# Patient Record
Sex: Female | Born: 1945 | Race: White | Hispanic: No | Smoking: Never smoker
Health system: Southern US, Community
[De-identification: ages and names within clinical notes are randomized; demographics above are authoritative.]

## PROBLEM LIST (undated history)

## (undated) DIAGNOSIS — K8020 Calculus of gallbladder without cholecystitis without obstruction: Secondary | ICD-10-CM

## (undated) DIAGNOSIS — F419 Anxiety disorder, unspecified: Secondary | ICD-10-CM

## (undated) DIAGNOSIS — I499 Cardiac arrhythmia, unspecified: Secondary | ICD-10-CM

## (undated) DIAGNOSIS — D649 Anemia, unspecified: Secondary | ICD-10-CM

## (undated) DIAGNOSIS — Z809 Family history of malignant neoplasm, unspecified: Secondary | ICD-10-CM

## (undated) DIAGNOSIS — R55 Syncope and collapse: Secondary | ICD-10-CM

## (undated) DIAGNOSIS — E119 Type 2 diabetes mellitus without complications: Secondary | ICD-10-CM

## (undated) DIAGNOSIS — C801 Malignant (primary) neoplasm, unspecified: Secondary | ICD-10-CM

## (undated) DIAGNOSIS — G4730 Sleep apnea, unspecified: Secondary | ICD-10-CM

## (undated) DIAGNOSIS — M1990 Unspecified osteoarthritis, unspecified site: Secondary | ICD-10-CM

## (undated) DIAGNOSIS — I10 Essential (primary) hypertension: Secondary | ICD-10-CM

## (undated) DIAGNOSIS — Z803 Family history of malignant neoplasm of breast: Secondary | ICD-10-CM

## (undated) DIAGNOSIS — Z923 Personal history of irradiation: Secondary | ICD-10-CM

## (undated) DIAGNOSIS — Z973 Presence of spectacles and contact lenses: Secondary | ICD-10-CM

## (undated) DIAGNOSIS — E669 Obesity, unspecified: Secondary | ICD-10-CM

## (undated) DIAGNOSIS — Z87442 Personal history of urinary calculi: Secondary | ICD-10-CM

## (undated) DIAGNOSIS — Z950 Presence of cardiac pacemaker: Secondary | ICD-10-CM

## (undated) DIAGNOSIS — E039 Hypothyroidism, unspecified: Secondary | ICD-10-CM

## (undated) DIAGNOSIS — K219 Gastro-esophageal reflux disease without esophagitis: Secondary | ICD-10-CM

## (undated) HISTORY — PX: COLONOSCOPY: SHX174

## (undated) HISTORY — PX: CHOLECYSTECTOMY: SHX55

## (undated) HISTORY — PX: FOOT SURGERY: SHX648

## (undated) HISTORY — PX: BREAST SURGERY: SHX581

## (undated) HISTORY — PX: FOOT ARTHROTOMY: SHX952

## (undated) HISTORY — DX: Syncope and collapse: R55

## (undated) HISTORY — PX: EYE SURGERY: SHX253

## (undated) HISTORY — DX: Personal history of irradiation: Z92.3

## (undated) HISTORY — PX: ANKLE SURGERY: SHX546

## (undated) HISTORY — DX: Family history of malignant neoplasm of breast: Z80.3

## (undated) HISTORY — PX: DEEP THIGH / KNEE TUMOR EXCISION: SUR571

## (undated) HISTORY — PX: KNEE SURGERY: SHX244

## (undated) HISTORY — DX: Family history of malignant neoplasm, unspecified: Z80.9

---

## 2003-08-09 ENCOUNTER — Ambulatory Visit (HOSPITAL_COMMUNITY): Admission: RE | Admit: 2003-08-09 | Discharge: 2003-08-09 | Payer: Self-pay | Admitting: Internal Medicine

## 2005-04-18 ENCOUNTER — Ambulatory Visit (HOSPITAL_COMMUNITY): Admission: RE | Admit: 2005-04-18 | Discharge: 2005-04-18 | Payer: Self-pay | Admitting: Internal Medicine

## 2010-05-07 HISTORY — PX: SHOULDER SURGERY: SHX246

## 2011-05-23 DIAGNOSIS — M79609 Pain in unspecified limb: Secondary | ICD-10-CM | POA: Diagnosis not present

## 2011-05-23 DIAGNOSIS — L600 Ingrowing nail: Secondary | ICD-10-CM | POA: Diagnosis not present

## 2011-05-25 DIAGNOSIS — H43399 Other vitreous opacities, unspecified eye: Secondary | ICD-10-CM | POA: Diagnosis not present

## 2011-05-25 DIAGNOSIS — H353190 Nonexudative age-related macular degeneration, unspecified eye, stage unspecified: Secondary | ICD-10-CM | POA: Diagnosis not present

## 2011-05-25 DIAGNOSIS — H53149 Visual discomfort, unspecified: Secondary | ICD-10-CM | POA: Diagnosis not present

## 2011-05-25 DIAGNOSIS — E119 Type 2 diabetes mellitus without complications: Secondary | ICD-10-CM | POA: Diagnosis not present

## 2011-07-04 DIAGNOSIS — J019 Acute sinusitis, unspecified: Secondary | ICD-10-CM | POA: Diagnosis not present

## 2011-07-04 DIAGNOSIS — J069 Acute upper respiratory infection, unspecified: Secondary | ICD-10-CM | POA: Diagnosis not present

## 2011-07-04 DIAGNOSIS — J029 Acute pharyngitis, unspecified: Secondary | ICD-10-CM | POA: Diagnosis not present

## 2011-07-04 DIAGNOSIS — R05 Cough: Secondary | ICD-10-CM | POA: Diagnosis not present

## 2011-07-31 DIAGNOSIS — H6120 Impacted cerumen, unspecified ear: Secondary | ICD-10-CM | POA: Diagnosis not present

## 2011-09-12 DIAGNOSIS — I10 Essential (primary) hypertension: Secondary | ICD-10-CM | POA: Diagnosis not present

## 2011-09-12 DIAGNOSIS — E782 Mixed hyperlipidemia: Secondary | ICD-10-CM | POA: Diagnosis not present

## 2011-09-12 DIAGNOSIS — E039 Hypothyroidism, unspecified: Secondary | ICD-10-CM | POA: Diagnosis not present

## 2011-09-26 DIAGNOSIS — M25569 Pain in unspecified knee: Secondary | ICD-10-CM | POA: Diagnosis not present

## 2011-09-27 DIAGNOSIS — M25569 Pain in unspecified knee: Secondary | ICD-10-CM | POA: Diagnosis not present

## 2011-09-27 DIAGNOSIS — M2240 Chondromalacia patellae, unspecified knee: Secondary | ICD-10-CM | POA: Diagnosis not present

## 2011-09-27 DIAGNOSIS — IMO0002 Reserved for concepts with insufficient information to code with codable children: Secondary | ICD-10-CM | POA: Diagnosis not present

## 2011-10-03 DIAGNOSIS — M1710 Unilateral primary osteoarthritis, unspecified knee: Secondary | ICD-10-CM | POA: Diagnosis not present

## 2011-10-03 DIAGNOSIS — E669 Obesity, unspecified: Secondary | ICD-10-CM | POA: Diagnosis not present

## 2011-10-03 DIAGNOSIS — E039 Hypothyroidism, unspecified: Secondary | ICD-10-CM | POA: Diagnosis not present

## 2011-10-03 DIAGNOSIS — M1990 Unspecified osteoarthritis, unspecified site: Secondary | ICD-10-CM | POA: Diagnosis not present

## 2011-10-03 DIAGNOSIS — E782 Mixed hyperlipidemia: Secondary | ICD-10-CM | POA: Diagnosis not present

## 2011-10-03 DIAGNOSIS — I10 Essential (primary) hypertension: Secondary | ICD-10-CM | POA: Diagnosis not present

## 2011-10-10 DIAGNOSIS — M1710 Unilateral primary osteoarthritis, unspecified knee: Secondary | ICD-10-CM | POA: Diagnosis not present

## 2011-10-17 DIAGNOSIS — M1710 Unilateral primary osteoarthritis, unspecified knee: Secondary | ICD-10-CM | POA: Diagnosis not present

## 2011-11-14 DIAGNOSIS — S90929A Unspecified superficial injury of unspecified foot, initial encounter: Secondary | ICD-10-CM | POA: Diagnosis not present

## 2011-11-14 DIAGNOSIS — M1710 Unilateral primary osteoarthritis, unspecified knee: Secondary | ICD-10-CM | POA: Diagnosis not present

## 2011-11-21 DIAGNOSIS — M1710 Unilateral primary osteoarthritis, unspecified knee: Secondary | ICD-10-CM | POA: Diagnosis not present

## 2011-11-28 DIAGNOSIS — M1710 Unilateral primary osteoarthritis, unspecified knee: Secondary | ICD-10-CM | POA: Diagnosis not present

## 2012-01-16 DIAGNOSIS — I10 Essential (primary) hypertension: Secondary | ICD-10-CM | POA: Diagnosis not present

## 2012-01-16 DIAGNOSIS — E7800 Pure hypercholesterolemia, unspecified: Secondary | ICD-10-CM | POA: Diagnosis not present

## 2012-01-23 DIAGNOSIS — E669 Obesity, unspecified: Secondary | ICD-10-CM | POA: Diagnosis not present

## 2012-01-23 DIAGNOSIS — E782 Mixed hyperlipidemia: Secondary | ICD-10-CM | POA: Diagnosis not present

## 2012-01-23 DIAGNOSIS — M1990 Unspecified osteoarthritis, unspecified site: Secondary | ICD-10-CM | POA: Diagnosis not present

## 2012-01-23 DIAGNOSIS — Z23 Encounter for immunization: Secondary | ICD-10-CM | POA: Diagnosis not present

## 2012-01-23 DIAGNOSIS — E039 Hypothyroidism, unspecified: Secondary | ICD-10-CM | POA: Diagnosis not present

## 2012-01-23 DIAGNOSIS — I10 Essential (primary) hypertension: Secondary | ICD-10-CM | POA: Diagnosis not present

## 2012-06-03 DIAGNOSIS — M79609 Pain in unspecified limb: Secondary | ICD-10-CM | POA: Diagnosis not present

## 2012-06-03 DIAGNOSIS — Q828 Other specified congenital malformations of skin: Secondary | ICD-10-CM | POA: Diagnosis not present

## 2012-06-04 DIAGNOSIS — H353190 Nonexudative age-related macular degeneration, unspecified eye, stage unspecified: Secondary | ICD-10-CM | POA: Diagnosis not present

## 2012-06-04 DIAGNOSIS — H04129 Dry eye syndrome of unspecified lacrimal gland: Secondary | ICD-10-CM | POA: Diagnosis not present

## 2012-06-04 DIAGNOSIS — Z961 Presence of intraocular lens: Secondary | ICD-10-CM | POA: Diagnosis not present

## 2012-06-04 DIAGNOSIS — D492 Neoplasm of unspecified behavior of bone, soft tissue, and skin: Secondary | ICD-10-CM | POA: Diagnosis not present

## 2012-06-04 DIAGNOSIS — H35039 Hypertensive retinopathy, unspecified eye: Secondary | ICD-10-CM | POA: Diagnosis not present

## 2012-06-06 DIAGNOSIS — I10 Essential (primary) hypertension: Secondary | ICD-10-CM | POA: Diagnosis not present

## 2012-06-06 DIAGNOSIS — E782 Mixed hyperlipidemia: Secondary | ICD-10-CM | POA: Diagnosis not present

## 2012-06-25 DIAGNOSIS — L570 Actinic keratosis: Secondary | ICD-10-CM | POA: Diagnosis not present

## 2012-06-25 DIAGNOSIS — L821 Other seborrheic keratosis: Secondary | ICD-10-CM | POA: Diagnosis not present

## 2012-06-25 DIAGNOSIS — D485 Neoplasm of uncertain behavior of skin: Secondary | ICD-10-CM | POA: Diagnosis not present

## 2012-06-25 DIAGNOSIS — L820 Inflamed seborrheic keratosis: Secondary | ICD-10-CM | POA: Diagnosis not present

## 2012-10-21 DIAGNOSIS — IMO0001 Reserved for inherently not codable concepts without codable children: Secondary | ICD-10-CM | POA: Diagnosis not present

## 2012-10-21 DIAGNOSIS — E782 Mixed hyperlipidemia: Secondary | ICD-10-CM | POA: Diagnosis not present

## 2012-10-21 DIAGNOSIS — I10 Essential (primary) hypertension: Secondary | ICD-10-CM | POA: Diagnosis not present

## 2012-10-24 DIAGNOSIS — M1990 Unspecified osteoarthritis, unspecified site: Secondary | ICD-10-CM | POA: Diagnosis not present

## 2012-10-24 DIAGNOSIS — E669 Obesity, unspecified: Secondary | ICD-10-CM | POA: Diagnosis not present

## 2012-10-24 DIAGNOSIS — E039 Hypothyroidism, unspecified: Secondary | ICD-10-CM | POA: Diagnosis not present

## 2012-10-24 DIAGNOSIS — I10 Essential (primary) hypertension: Secondary | ICD-10-CM | POA: Diagnosis not present

## 2012-10-24 DIAGNOSIS — E782 Mixed hyperlipidemia: Secondary | ICD-10-CM | POA: Diagnosis not present

## 2012-11-05 DIAGNOSIS — Z1231 Encounter for screening mammogram for malignant neoplasm of breast: Secondary | ICD-10-CM | POA: Diagnosis not present

## 2012-11-20 ENCOUNTER — Other Ambulatory Visit: Payer: Self-pay | Admitting: Radiology

## 2012-11-20 DIAGNOSIS — R209 Unspecified disturbances of skin sensation: Secondary | ICD-10-CM

## 2012-11-21 ENCOUNTER — Ambulatory Visit (INDEPENDENT_AMBULATORY_CARE_PROVIDER_SITE_OTHER): Payer: Medicare Other | Admitting: Radiology

## 2012-11-21 DIAGNOSIS — R209 Unspecified disturbances of skin sensation: Secondary | ICD-10-CM

## 2012-12-01 ENCOUNTER — Telehealth: Payer: Self-pay | Admitting: *Deleted

## 2012-12-01 NOTE — Telephone Encounter (Signed)
Patient has right carpal tunnel syndrome. Please send report to referring MD (Dr. Donzetta Sprung). Patient should follow up with him for further advice, mgmt.  -VRP

## 2012-12-01 NOTE — Procedures (Signed)
   [facility]  NCS (NERVE CONDUCTION STUDY) WITH EMG (ELECTROMYOGRAPHY) REPORT   STUDY DATE: 11/21/12 PATIENT NAME: Alicia [name] DOB: 1945-07-20 MRN: [id number]  ORDERING CLINICIAN: Donzetta Sprung  TECHNOLOGIST: Kaylyn Lim ELECTROMYOGRAPHER: not ordered  CLINICAL INFORMATION: 67 year old female with right arm numbness and pain.  FINDINGS: NERVE CONDUCTION STUDY: Right median motor responses prolonged distal latency (5.1 ms), normal amplitude, normal conduction velocity and normal F-wave latency. Right ulnar motor response has normal distal latency, amplitude, conduction velocity and F-wave latency. Right median sensory response has normal amplitude and slow conduction velocity (42 m/s). Right ulnar, right radial sensory responses are normal. Right median transcarpal mixed nerve response has normal amplitude and slow conduction velocity (28 m/s). Right ulnar transcarpal mixed nerve response is normal.  NEEDLE ELECTROMYOGRAPHY: Not ordered.  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   INTERPRETING PHYSICIAN:  Suanne Marker, MD Certified in Neurology, Neurophysiology and Neuroimaging  [facility] [address] [place], [place] [address] [phone]

## 2012-12-01 NOTE — Telephone Encounter (Signed)
Patient is calling wanting to know the results of her EEG she had done on 11-21-12. Please advise.

## 2012-12-02 NOTE — Telephone Encounter (Signed)
Spoke with pt and relayed EEG results as well as Dr Richrd Humbles advice.  Pt understood and had no questions.  Results are being faxed to referring Dr.

## 2012-12-05 DIAGNOSIS — Z950 Presence of cardiac pacemaker: Secondary | ICD-10-CM | POA: Insufficient documentation

## 2012-12-12 DIAGNOSIS — Z79899 Other long term (current) drug therapy: Secondary | ICD-10-CM | POA: Diagnosis not present

## 2012-12-12 DIAGNOSIS — Z888 Allergy status to other drugs, medicaments and biological substances status: Secondary | ICD-10-CM | POA: Diagnosis not present

## 2012-12-12 DIAGNOSIS — Z7982 Long term (current) use of aspirin: Secondary | ICD-10-CM | POA: Diagnosis not present

## 2012-12-12 DIAGNOSIS — I10 Essential (primary) hypertension: Secondary | ICD-10-CM | POA: Diagnosis not present

## 2012-12-12 DIAGNOSIS — E669 Obesity, unspecified: Secondary | ICD-10-CM | POA: Diagnosis not present

## 2012-12-12 DIAGNOSIS — M79609 Pain in unspecified limb: Secondary | ICD-10-CM | POA: Diagnosis not present

## 2012-12-12 DIAGNOSIS — E119 Type 2 diabetes mellitus without complications: Secondary | ICD-10-CM | POA: Diagnosis not present

## 2012-12-12 DIAGNOSIS — Z882 Allergy status to sulfonamides status: Secondary | ICD-10-CM | POA: Diagnosis not present

## 2012-12-12 DIAGNOSIS — G5600 Carpal tunnel syndrome, unspecified upper limb: Secondary | ICD-10-CM | POA: Diagnosis not present

## 2012-12-12 DIAGNOSIS — M7989 Other specified soft tissue disorders: Secondary | ICD-10-CM | POA: Diagnosis not present

## 2012-12-12 DIAGNOSIS — Z6836 Body mass index (BMI) 36.0-36.9, adult: Secondary | ICD-10-CM | POA: Diagnosis not present

## 2012-12-12 DIAGNOSIS — E039 Hypothyroidism, unspecified: Secondary | ICD-10-CM | POA: Diagnosis not present

## 2012-12-12 DIAGNOSIS — Z91040 Latex allergy status: Secondary | ICD-10-CM | POA: Diagnosis not present

## 2012-12-12 DIAGNOSIS — I441 Atrioventricular block, second degree: Secondary | ICD-10-CM | POA: Diagnosis not present

## 2012-12-12 DIAGNOSIS — M6530 Trigger finger, unspecified finger: Secondary | ICD-10-CM | POA: Diagnosis not present

## 2012-12-12 DIAGNOSIS — R55 Syncope and collapse: Secondary | ICD-10-CM | POA: Diagnosis not present

## 2012-12-13 ENCOUNTER — Encounter (HOSPITAL_COMMUNITY): Payer: Self-pay | Admitting: Internal Medicine

## 2012-12-13 ENCOUNTER — Inpatient Hospital Stay (HOSPITAL_COMMUNITY)
Admission: AD | Admit: 2012-12-13 | Discharge: 2012-12-16 | DRG: 244 | Disposition: A | Discharge status: Home / Self Care | Payer: Medicare Other | Source: Other Acute Inpatient Hospital | Attending: Internal Medicine | Admitting: Internal Medicine

## 2012-12-13 ENCOUNTER — Inpatient Hospital Stay (HOSPITAL_COMMUNITY): Payer: Medicare Other

## 2012-12-13 DIAGNOSIS — I10 Essential (primary) hypertension: Secondary | ICD-10-CM | POA: Diagnosis not present

## 2012-12-13 DIAGNOSIS — Z9089 Acquired absence of other organs: Secondary | ICD-10-CM

## 2012-12-13 DIAGNOSIS — Z79899 Other long term (current) drug therapy: Secondary | ICD-10-CM | POA: Diagnosis not present

## 2012-12-13 DIAGNOSIS — Z91040 Latex allergy status: Secondary | ICD-10-CM | POA: Diagnosis not present

## 2012-12-13 DIAGNOSIS — Z01818 Encounter for other preprocedural examination: Secondary | ICD-10-CM | POA: Diagnosis not present

## 2012-12-13 DIAGNOSIS — E119 Type 2 diabetes mellitus without complications: Secondary | ICD-10-CM | POA: Diagnosis not present

## 2012-12-13 DIAGNOSIS — Z7982 Long term (current) use of aspirin: Secondary | ICD-10-CM | POA: Diagnosis not present

## 2012-12-13 DIAGNOSIS — IMO0002 Reserved for concepts with insufficient information to code with codable children: Secondary | ICD-10-CM | POA: Diagnosis not present

## 2012-12-13 DIAGNOSIS — Z882 Allergy status to sulfonamides status: Secondary | ICD-10-CM

## 2012-12-13 DIAGNOSIS — I441 Atrioventricular block, second degree: Principal | ICD-10-CM | POA: Diagnosis present

## 2012-12-13 DIAGNOSIS — E039 Hypothyroidism, unspecified: Secondary | ICD-10-CM | POA: Diagnosis present

## 2012-12-13 DIAGNOSIS — R55 Syncope and collapse: Secondary | ICD-10-CM | POA: Diagnosis not present

## 2012-12-13 DIAGNOSIS — M1990 Unspecified osteoarthritis, unspecified site: Secondary | ICD-10-CM | POA: Diagnosis present

## 2012-12-13 DIAGNOSIS — Z6836 Body mass index (BMI) 36.0-36.9, adult: Secondary | ICD-10-CM | POA: Diagnosis not present

## 2012-12-13 DIAGNOSIS — I517 Cardiomegaly: Secondary | ICD-10-CM | POA: Diagnosis not present

## 2012-12-13 DIAGNOSIS — M1710 Unilateral primary osteoarthritis, unspecified knee: Secondary | ICD-10-CM | POA: Diagnosis not present

## 2012-12-13 DIAGNOSIS — E669 Obesity, unspecified: Secondary | ICD-10-CM | POA: Diagnosis present

## 2012-12-13 HISTORY — DX: Hypothyroidism, unspecified: E03.9

## 2012-12-13 HISTORY — DX: Presence of cardiac pacemaker: Z95.0

## 2012-12-13 HISTORY — DX: Essential (primary) hypertension: I10

## 2012-12-13 HISTORY — DX: Cardiac arrhythmia, unspecified: I49.9

## 2012-12-13 HISTORY — DX: Type 2 diabetes mellitus without complications: E11.9

## 2012-12-13 HISTORY — DX: Obesity, unspecified: E66.9

## 2012-12-13 HISTORY — DX: Unspecified osteoarthritis, unspecified site: M19.90

## 2012-12-13 HISTORY — DX: Anemia, unspecified: D64.9

## 2012-12-13 LAB — GLUCOSE, CAPILLARY: Glucose-Capillary: 210 mg/dL — ABNORMAL HIGH (ref 70–99)

## 2012-12-13 MED ORDER — SODIUM CHLORIDE 0.9 % IJ SOLN
3.0000 mL | Freq: Two times a day (BID) | INTRAMUSCULAR | Status: DC
  Administered 2012-12-13 – 2012-12-15 (×4): 3 mL via INTRAVENOUS

## 2012-12-13 MED ORDER — ONDANSETRON HCL 4 MG/2ML IJ SOLN: 4.0000 mg | Freq: Four times a day (QID) | INTRAMUSCULAR | Status: DC | PRN

## 2012-12-13 MED ORDER — GLIPIZIDE ER 10 MG PO TB24
10.0000 mg | ORAL_TABLET | Freq: Every day | ORAL | Status: DC
  Administered 2012-12-14 – 2012-12-16 (×3): 10 mg via ORAL
  Filled 2012-12-13 (×4): qty 1

## 2012-12-13 MED ORDER — HEPARIN SODIUM (PORCINE) 5000 UNIT/ML IJ SOLN
5000.0000 [IU] | Freq: Three times a day (TID) | INTRAMUSCULAR | Status: DC
  Administered 2012-12-13 – 2012-12-15 (×7): 5000 [IU] via SUBCUTANEOUS
  Filled 2012-12-13 (×12): qty 1

## 2012-12-13 MED ORDER — SODIUM CHLORIDE 0.9 % IJ SOLN: 3.0000 mL | INTRAMUSCULAR | Status: DC | PRN

## 2012-12-13 MED ORDER — ACETAMINOPHEN 325 MG PO TABS: 650.0000 mg | ORAL_TABLET | ORAL | Status: DC | PRN

## 2012-12-13 MED ORDER — LEVOTHYROXINE SODIUM 125 MCG PO TABS
125.0000 ug | ORAL_TABLET | Freq: Every day | ORAL | Status: DC
  Administered 2012-12-14 – 2012-12-16 (×3): 125 ug via ORAL
  Filled 2012-12-13 (×4): qty 1

## 2012-12-13 MED ORDER — SODIUM CHLORIDE 0.9 % IV SOLN: 250.0000 mL | INTRAVENOUS | Status: DC | PRN

## 2012-12-13 MED ORDER — LISINOPRIL 20 MG PO TABS
20.0000 mg | ORAL_TABLET | Freq: Every day | ORAL | Status: DC
  Administered 2012-12-13 – 2012-12-15 (×3): 20 mg via ORAL
  Filled 2012-12-13 (×4): qty 1

## 2012-12-13 NOTE — H&P (Signed)
ELECTROPHYSIOLOGY ADMIT NOTE     Primary Care Physician: Donzetta Sprung, MD Referring Physician:  Dr Wende Crease  Admit Date: 12/13/2012  Reason for consultation:  Syncope, AV block  Alicia [name] is a 67 y.o. female with a h/o DM, HTN, and obesity who presents for further evaluation of syncope.  She reports being in her usual state of health yesterday.  She was at an Orthopedic clinic having a shoulder Xray when she had sudden syncope.  She reports that she had been standing for about 5 minutes.  While having her xray, she had abrupt sweats with nausea immediately followed by syncope.  She woke on the floor.  She was observed overnight at 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  She is therefore transferred to [facility] for further evaluation.  She has been on no AV nodal agents recently.  Today, she denies symptoms of palpitations, chest pain, shortness of breath, orthopnea, PND, lower extremity edema, dizziness, or neurologic sequela.  She denies any other episodes of recent syncope. The patient is tolerating medications without difficulties and is otherwise without complaint today.   Past Medical History  Diagnosis Date  . Obesity   . Hypertension   . Diabetes   . DJD (degenerative joint disease)   . Hypothyroidism    Past Surgical History  Procedure Laterality Date  . Cholecystectomy         Allergies  Allergen Reactions  . Sulfa Antibiotics Hives and Swelling  . Tape     Paper tape is okay  . Latex Rash    Sensitive not allergic    History   Social History  . Marital Status: Married    Spouse Name: N/A    Number of Children: N/A  . Years of Education: N/A   Occupational History  . Not on file.   Social History Main Topics  . Smoking status: Never Smoker   . Smokeless tobacco: Not on file  . Alcohol Use: No  . Drug Use: No  . Sexually Active: Not on file   Other Topics Concern  . Not on file   Social History Narrative   Retired and lives in [place] with spouse    Family History  Problem Relation Age of Onset  . Sudden death Brother     age 52s, died while playing raquetball    ROS- All systems are reviewed and negative except as per the HPI above  Physical Exam: Telemetry: from [place] is reviewed and reveals mobitz II second degree AV block Filed Vitals:   12/13/12 1300  BP: 142/55  Pulse: 66  Temp: 98.4 F (36.9 C)  TempSrc: Oral  Resp: 20  Height: 5\' 7"  (1.702 m)  Weight: 229 lb 15 oz (104.3 kg)  SpO2: 96%    GEN- The patient is overweight appearing, alert and oriented x 3 today.   Head- normocephalic, atraumatic Eyes-  Sclera clear, conjunctiva pink Ears- hearing intact Oropharynx- clear Neck- supple, no JVP Lymph- no cervical lymphadenopathy Lungs- Clear to ausculation bilaterally, normal work of breathing Heart- Regular rate and rhythm, no murmurs, rubs or gallops, PMI not laterally displaced GI- soft, NT, ND, + BS Extremities- no clubbing, cyanosis, or edema MS- no significant deformity or atrophy Skin- no rash or lesion Psych- euthymic mood, full affect Neuro- strength and sensation are intact  EKG 12/12/12- sinus rhythm 66 bpm, LAHB, LVH with repolarization abnormality Labs from [place] are reviewed and are unremarkable  Labs:  No results found for this basename: WBC, HGB, HCT, MCV, PLT   No results found for this basename: NA, K, CL, CO2, BUN, CREATININE, CALCIUM, LABALBU, PROT, BILITOT, ALKPHOS, ALT, AST, GLUCOSE,  in the last 168 hours No results found for this basename: CKTOTAL, CKMB, CKMBINDEX, TROPONINI    No results found for this basename: CHOL   No results found for this basename: HDL   No results found for this basename: LDLCALC   No results found for this basename: TRIG   No results found for this basename: CHOLHDL   No results found for this basename: LDLDIRECT      ASSESSMENT AND PLAN:   1. 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  No reversible causes are found.  I would therefore recommend pacemaker implantation at this time.  Risks, benefits, alternatives to pacemaker implantation were discussed in detail with the patient today. The patient understands that the risks include but are not limited to bleeding, infection, pneumothorax, perforation, tamponade, vascular damage, renal failure, MI, stroke, death,  and lead dislodgement and wishes to proceed. We will therefore schedule the procedure at the next available time. Obtain echo to evaluate for any structural heart issues.  No driving for 6 months (pt aware)   2. HTN Follow BP Hold hctz  3. DM Stable No change required today    Hillis Range, MD 12/13/2012  2:36 PM

## 2012-12-14 DIAGNOSIS — I441 Atrioventricular block, second degree: Secondary | ICD-10-CM | POA: Diagnosis not present

## 2012-12-14 DIAGNOSIS — I517 Cardiomegaly: Secondary | ICD-10-CM | POA: Diagnosis not present

## 2012-12-14 DIAGNOSIS — I10 Essential (primary) hypertension: Secondary | ICD-10-CM | POA: Diagnosis not present

## 2012-12-14 DIAGNOSIS — R55 Syncope and collapse: Secondary | ICD-10-CM | POA: Diagnosis not present

## 2012-12-14 DIAGNOSIS — E119 Type 2 diabetes mellitus without complications: Secondary | ICD-10-CM | POA: Diagnosis not present

## 2012-12-14 LAB — BASIC METABOLIC PANEL
BUN: 20 mg/dL (ref 6–23)
Chloride: 98 mEq/L (ref 96–112)
Creatinine, Ser: 1.11 mg/dL — ABNORMAL HIGH (ref 0.50–1.10)
GFR calc Af Amer: 58 mL/min — ABNORMAL LOW (ref >90)
Glucose, Bld: 165 mg/dL — ABNORMAL HIGH (ref 70–99)
Potassium: 3.7 mEq/L (ref 3.5–5.1)

## 2012-12-14 LAB — CBC
HCT: 37.7 % (ref 36.0–46.0)
Hemoglobin: 13.2 g/dL (ref 12.0–15.0)
MCHC: 35 g/dL (ref 30.0–36.0)
MCV: 90.4 fL (ref 78.0–100.0)
RDW: 12.5 % (ref 11.5–15.5)

## 2012-12-14 LAB — GLUCOSE, CAPILLARY
Glucose-Capillary: 155 mg/dL — ABNORMAL HIGH (ref 70–99)
Glucose-Capillary: 149 mg/dL — ABNORMAL HIGH (ref 70–99)
Glucose-Capillary: 106 mg/dL — ABNORMAL HIGH (ref 70–99)
Glucose-Capillary: 94 mg/dL (ref 70–99)

## 2012-12-14 MED ORDER — SODIUM CHLORIDE 0.9 % IV SOLN: INTRAVENOUS | Status: DC

## 2012-12-14 MED ORDER — CHLORHEXIDINE GLUCONATE 4 % EX LIQD
60.0000 mL | Freq: Once | CUTANEOUS | Status: AC
  Administered 2012-12-15: 4 via TOPICAL
  Filled 2012-12-14: qty 60

## 2012-12-14 MED ORDER — CHLORHEXIDINE GLUCONATE 4 % EX LIQD
60.0000 mL | Freq: Once | CUTANEOUS | Status: AC
  Administered 2012-12-14: 4 via TOPICAL
  Filled 2012-12-14: qty 60

## 2012-12-14 MED ORDER — SODIUM CHLORIDE 0.45 % IV SOLN: INTRAVENOUS | Status: DC

## 2012-12-14 MED ORDER — CEFAZOLIN SODIUM-DEXTROSE 2-3 GM-% IV SOLR
2.0000 g | INTRAVENOUS | Status: DC
  Filled 2012-12-14: qty 50

## 2012-12-14 MED ORDER — SODIUM CHLORIDE 0.9 % IR SOLN
80.0000 mg | Status: DC
  Filled 2012-12-14: qty 2

## 2012-12-14 NOTE — Progress Notes (Signed)
SUBJECTIVE: The patient is doing well today.  At this time, she denies chest pain, shortness of breath, or any new concerns.  [name] glipiZIDE  10 mg Oral Q breakfast  . heparin  5,000 Units Subcutaneous Q8H  . levothyroxine  125 mcg Oral QAC breakfast  . lisinopril  20 mg Oral Daily  . sodium chloride  3 mL Intravenous Q12H      OBJECTIVE: Physical Exam: Filed Vitals:   12/13/12 1300 12/13/12 2036 12/14/12 0546  BP: 142/55 124/52 123/45  Pulse: 66 62 54  Temp: 98.4 F (36.9 C) 98.6 F (37 C) 97.7 F (36.5 C)  TempSrc: Oral Oral Oral  Resp: 20 18 18   Height: 5\' 7"  (1.702 m)    Weight: 229 lb 15 oz (104.3 kg)    SpO2: 96% 96% 97%    Intake/Output Summary (Last 24 hours) at 12/14/12 9147 Last data filed at 12/13/12 1632  Gross per 24 hour  Intake    303 ml  Output      0 ml  Net    303 ml    Telemetry reveals sinus rhythm with occasional mobitz II second degree AV block  GEN- The patient is well appearing, alert and oriented x 3 today.   Head- normocephalic, atraumatic Eyes-  Sclera clear, conjunctiva pink Ears- hearing intact Oropharynx- clear Neck- supple, no JVP Lymph- no cervical lymphadenopathy Lungs- Clear to ausculation bilaterally, normal work of breathing Heart- Regular rate and rhythm, no murmurs, rubs or gallops, PMI not laterally displaced GI- soft, NT, ND, + BS Extremities- no clubbing, cyanosis, or edema  LABS: Basic Metabolic Panel:  Recent Labs  82/95/62 0440  NA 135  K 3.7  CL 98  CO2 27  GLUCOSE 165*  BUN 20  CREATININE 1.11*  CALCIUM 9.4   Liver Function Tests: No results found for this basename: AST, ALT, ALKPHOS, BILITOT, PROT, ALBUMIN,  in the last 72 hours No results found for this basename: LIPASE, AMYLASE,  in the last 72 hours CBC:  Recent Labs  12/14/12 0440  WBC 7.3  HGB 13.2  HCT 37.7  MCV 90.4  PLT 243    RADIOLOGY: X-ray Chest Pa And Lateral  12/13/2012   *RADIOLOGY REPORT*  Clinical Data: Preoperative  respiratory examination for pacemaker placement.  History of hypertension and diabetes.  CHEST - 2 VIEW  Comparison: None.  Findings: The heart size and mediastinal contours are normal.  The lungs demonstrate mild central airway thickening without hyperinflation or confluent airspace opacity. There is no pleural effusion.  Degenerative changes are present throughout the spine. Cholecystectomy clips are noted.  IMPRESSION: No active cardiopulmonary process.  Mild central airway thickening, likely chronic.   Original Report Authenticated By: Carey Bullocks, M.D.    ASSESSMENT AND PLAN:  1. Syncope  The patient has symptomatic mobitz II second degree AV block with syncope. No reversible causes are found. I would therefore recommend pacemaker implantation at this time. Risks, benefits, alternatives to pacemaker implantation were discussed in detail with the patient today. The patient understands that the risks include but are not limited to bleeding, infection, pneumothorax, perforation, tamponade, vascular damage, renal failure, MI, stroke, death, and lead dislodgement and wishes to proceed. We will therefore schedule the procedure at the next available time.  Obtain echo to evaluate for any structural heart issues. No driving for 6 months (pt aware)  Echo and TSH are pending  2. HTN  Follow BP  Hold hctz  Echo pending  3. DM  Stable  No change required today   Hillis Range, MD 12/14/2012 8:37 AM

## 2012-12-14 NOTE — Progress Notes (Signed)
  Echocardiogram 2D Echocardiogram has been performed.  Alicia [name] FRANCES 12/14/2012, 12:55 PM

## 2012-12-15 ENCOUNTER — Encounter (HOSPITAL_COMMUNITY)
Admission: AD | Disposition: A | Discharge status: Home / Self Care | Payer: Self-pay | Source: Other Acute Inpatient Hospital | Attending: Internal Medicine

## 2012-12-15 ENCOUNTER — Encounter (HOSPITAL_COMMUNITY): Payer: Self-pay | Admitting: General Practice

## 2012-12-15 DIAGNOSIS — E119 Type 2 diabetes mellitus without complications: Secondary | ICD-10-CM | POA: Diagnosis not present

## 2012-12-15 DIAGNOSIS — R55 Syncope and collapse: Secondary | ICD-10-CM | POA: Diagnosis not present

## 2012-12-15 DIAGNOSIS — I10 Essential (primary) hypertension: Secondary | ICD-10-CM | POA: Diagnosis not present

## 2012-12-15 DIAGNOSIS — Z950 Presence of cardiac pacemaker: Secondary | ICD-10-CM

## 2012-12-15 DIAGNOSIS — I441 Atrioventricular block, second degree: Secondary | ICD-10-CM | POA: Diagnosis not present

## 2012-12-15 HISTORY — PX: PACEMAKER INSERTION: SHX728

## 2012-12-15 HISTORY — PX: PERMANENT PACEMAKER INSERTION: SHX5480

## 2012-12-15 HISTORY — DX: Presence of cardiac pacemaker: Z95.0

## 2012-12-15 LAB — BASIC METABOLIC PANEL
Calcium: 9.3 mg/dL (ref 8.4–10.5)
GFR calc Af Amer: 56 mL/min — ABNORMAL LOW (ref >90)
GFR calc non Af Amer: 48 mL/min — ABNORMAL LOW (ref >90)
Sodium: 138 mEq/L (ref 135–145)

## 2012-12-15 SURGERY — PERMANENT PACEMAKER INSERTION
Anesthesia: LOCAL

## 2012-12-15 MED ORDER — PANTOPRAZOLE SODIUM 40 MG PO TBEC
40.0000 mg | DELAYED_RELEASE_TABLET | Freq: Every day | ORAL | Status: DC
  Administered 2012-12-15: 40 mg via ORAL
  Filled 2012-12-15: qty 1

## 2012-12-15 MED ORDER — FENTANYL CITRATE 0.05 MG/ML IJ SOLN
INTRAMUSCULAR | Status: AC
  Filled 2012-12-15: qty 2

## 2012-12-15 MED ORDER — ASPIRIN EC 81 MG PO TBEC
81.0000 mg | DELAYED_RELEASE_TABLET | ORAL | Status: DC
  Administered 2012-12-15: 81 mg via ORAL
  Filled 2012-12-15: qty 1

## 2012-12-15 MED ORDER — GLIPIZIDE ER 5 MG PO TB24: 5.0000 mg | ORAL_TABLET | Freq: Every day | ORAL | Status: DC

## 2012-12-15 MED ORDER — LIDOCAINE HCL (PF) 1 % IJ SOLN
INTRAMUSCULAR | Status: AC
  Filled 2012-12-15: qty 60

## 2012-12-15 MED ORDER — LEVOTHYROXINE SODIUM 125 MCG PO TABS: 125.0000 ug | ORAL_TABLET | Freq: Every day | ORAL | Status: DC

## 2012-12-15 MED ORDER — CEFAZOLIN SODIUM-DEXTROSE 2-3 GM-% IV SOLR
2.0000 g | Freq: Four times a day (QID) | INTRAVENOUS | Status: AC
  Administered 2012-12-15 – 2012-12-16 (×3): 2 g via INTRAVENOUS
  Filled 2012-12-15 (×4): qty 50

## 2012-12-15 MED ORDER — MIDAZOLAM HCL 5 MG/5ML IJ SOLN
INTRAMUSCULAR | Status: AC
  Filled 2012-12-15: qty 5

## 2012-12-15 MED ORDER — ONDANSETRON HCL 4 MG/2ML IJ SOLN: 4.0000 mg | Freq: Four times a day (QID) | INTRAMUSCULAR | Status: DC | PRN

## 2012-12-15 MED ORDER — CEFAZOLIN SODIUM-DEXTROSE 2-3 GM-% IV SOLR
2.0000 g | Freq: Four times a day (QID) | INTRAVENOUS | Status: DC
  Filled 2012-12-15 (×3): qty 50

## 2012-12-15 MED ORDER — OCUVITE PO TABS
1.0000 | ORAL_TABLET | Freq: Every day | ORAL | Status: DC
  Administered 2012-12-15: 1 via ORAL
  Filled 2012-12-15 (×2): qty 1

## 2012-12-15 MED ORDER — ACETAMINOPHEN 325 MG PO TABS
325.0000 mg | ORAL_TABLET | ORAL | Status: DC | PRN
Start: 2012-12-15 — End: 2012-12-15

## 2012-12-15 NOTE — Interval H&P Note (Signed)
History and Physical Interval Note: I have seen and examined the patient and reviewed her chart. The treatment options have been discussed by me personally and she wishes to proceed with PPM.   12/15/2012 7:07 AM  Alicia [name]  has presented today for surgery, with the diagnosis of bradycardia  The various methods of treatment have been discussed with the patient and family. After consideration of risks, benefits and other options for treatment, the patient has consented to  Procedure(s): PERMANENT PACEMAKER INSERTION (N/A) as a surgical intervention .  The patient's history has been reviewed, patient examined, no change in status, stable for surgery.  I have reviewed the patient's chart and labs.  Questions were answered to the patient's satisfaction.     Leonia Reeves.D.

## 2012-12-15 NOTE — CV Procedure (Signed)
PPM insertion via the left subclavian vein without immediate complication. [id number].

## 2012-12-16 ENCOUNTER — Inpatient Hospital Stay (HOSPITAL_COMMUNITY): Payer: Medicare Other

## 2012-12-16 DIAGNOSIS — I441 Atrioventricular block, second degree: Secondary | ICD-10-CM | POA: Diagnosis not present

## 2012-12-16 DIAGNOSIS — I10 Essential (primary) hypertension: Secondary | ICD-10-CM | POA: Diagnosis not present

## 2012-12-16 LAB — GLUCOSE, CAPILLARY

## 2012-12-16 NOTE — Op Note (Signed)
[name], [name] NO.:  [id number]  MEDICAL RECORD NO.:  [id number]  LOCATION:  3W23C                        FACILITY:  [facility]  PHYSICIAN:  Doylene Canning. Ladona Ridgel, MD    DATE OF BIRTH:  16-Jun-1945  DATE OF PROCEDURE:  12/15/2012 DATE OF DISCHARGE:                              OPERATIVE REPORT   PROCEDURE PERFORMED:  Insertion of a dual-chamber pacemaker.  INDICATION:  Syncope in the setting of intermittent class type 2 second- degree AV block, with no reversible causes.  INTRODUCTION:  The patient is a 67 year old woman who presents after a syncopal episode.  She was found to have symptomatic type 2 second- degree AV block, with no reversible causes, now referred for permanent pacemaker insertion.  PROCEDURE:  After informed was obtained, the patient was taken to the diagnostic EP lab in a fasting state.  After usual preparation and draping, intravenous fentanyl and midazolam were given for sedation.  A 30 mL of lidocaine was infiltrated into the left infraclavicular region. A 5-cm incision was carried out over this region.  Electrocautery was utilized to dissect down to the fascial plane.  The left subclavian vein was punctured x2 and the St. Jude model 2088T 52 cm active fixation pacing lead serial number [id number] was advanced into the right ventricle. The St. Jude model 2088T 46 cm active fixation pacing lead serial number [id number] was advanced to the right atrium.  Mapping was first carried out in the right ventricle.  At the final site, the R-waves were 11 mV and the pacing impedance with the lead actively fixed was 600 ohms, and the threshold 0.3 at 0.4 milliseconds.  10 V pacing not stimulate the diaphragm.  There was a prominent injury current.  With these satisfactory parameters, attention was then turned to placement of the atrial lead was placed in anterolateral portion of the right atrium where P-waves measured 3 mV.  The pacing impedance was 1000  ohms, and threshold was initially high around 2 V at 0.4 milliseconds.  There was a very prominent injury current.  10 V pacing did not stimulate the diaphragm.  With these satisfactory parameters, the leads were secured to the subpectoral fascia with a figure-of-eight silk suture, and the sewing sleeve was secured with silk suture.  Electrocautery was utilized to make subcutaneous pocket.  Antibiotic irrigation was utilized to irrigate the pocket and electrocautery was utilized to assure hemostasis.  The St. Jude Shari dual-chamber pacemaker serial [id number] was connected to the atrial and RV leads and placed back in the subcutaneous pocket where it was secured with silk suture.  The pocket was again irrigated with antibiotic irrigation.  The incision was closed with 2-0 and 3-0 Vicryl.  Benzoin and Steri-Strips were painted on the skin, a pressure dressing was applied.  The patient was returned to her room in satisfactory condition.  COMPLICATIONS:  There were no immediate complications.  RESULTS:  This demonstrates successful implantation of a St. Jude dual- chamber pacemaker in a patient with symptomatic Mobitz 2 second-degree AV block and syncope.     Doylene Canning. Ladona Ridgel, MD     GWT/MEDQ  D:  12/15/2012  T:  12/16/2012  Job:  [id number]  cc:   Kirstie Peri, MD Fax: [phone]

## 2012-12-16 NOTE — Progress Notes (Signed)
Pt discharged home with husband. IV removed, education and follow up appts gone over. No questions at this time.   Delynn Flavin, RN

## 2012-12-16 NOTE — Progress Notes (Signed)
   ELECTROPHYSIOLOGY ROUNDING NOTE    Patient Name: Alicia [name] Date of Encounter: 12/16/2012    SUBJECTIVE:Patient feels well this morning.  No chest pain or shortness of breath.  Minimal incisional soreness.   TELEMETRY: Reviewed telemetry pt in sinus rhythm with occasional atrial pacing Physical Exam: Filed Vitals:   12/15/12 1300 12/15/12 1400 12/15/12 2148 12/16/12 0423  BP: 132/51 132/51 149/56 151/76  Pulse:  69 68 60  Temp:  97 F (36.1 C) 97.9 F (36.6 C) 97.6 F (36.4 C)  TempSrc:  Oral Oral Oral  Resp:  18  18  Height:      Weight:      SpO2:  95% 98% 96%    GEN- The patient is well appearing, alert and oriented x 3 today.   Head- normocephalic, atraumatic Eyes-  Sclera clear, conjunctiva pink Ears- hearing intact Oropharynx- clear Neck- supple  Lungs- Clear to ausculation bilaterally, normal work of breathing Heart- Regular rate and rhythm, no murmurs, rubs or gallops, PMI not laterally displaced GI- soft, NT, ND, + BS Extremities- no clubbing, cyanosis, or edema Skin- pocket without hematoma   CURRENT MEDICATIONS: . aspirin EC  81 mg Oral Q M,W,F  . beta carotene w/minerals  1 tablet Oral Daily  .  ceFAZolin (ANCEF) IV  2 g Intravenous Q6H  . glipiZIDE  10 mg Oral Q breakfast  . heparin  5,000 Units Subcutaneous Q8H  . levothyroxine  125 mcg Oral QAC breakfast  . lisinopril  20 mg Oral Daily  . pantoprazole  40 mg Oral Daily  . sodium chloride  3 mL Intravenous Q12H    LABS: Basic Metabolic Panel:  Recent Labs  16/10/96 0440 12/15/12 0405  NA 135 138  K 3.7 3.7  CL 98 102  CO2 27 26  GLUCOSE 165* 107*  BUN 20 24*  CREATININE 1.11* 1.14*  CALCIUM 9.4 9.3   CBC:  Recent Labs  12/14/12 0440  WBC 7.3  HGB 13.2  HCT 37.7  MCV 90.4  PLT 243  Thyroid Function Tests:  Recent Labs  12/14/12 0440  TSH 3.478     Radiology/Studies:  X-ray Chest Pa And Lateral 12/13/2012   *RADIOLOGY REPORT*  Clinical Data: Preoperative respiratory  examination for pacemaker placement.  History of hypertension and diabetes.  CHEST - 2 VIEW  Comparison: None.  Findings: The heart size and mediastinal contours are normal.  The lungs demonstrate mild central airway thickening without hyperinflation or confluent airspace opacity. There is no pleural effusion.  Degenerative changes are present throughout the spine. Cholecystectomy clips are noted.  IMPRESSION: No active cardiopulmonary process.  Mild central airway thickening, likely chronic.   Original Report Authenticated By: Carey Bullocks, M.D.   Post op CXR reveals no obvious ptx, stable leads  DEVICE INTERROGATION: Device interrogation reviewed  Assessment and Plan:  1. Syncope and mobitz II second degree AV block Doing well s/p PPM Wound care, arm mobility, restrictions reviewed with patient.  Routine follow up scheduled.  Interrogation reviewed  No driving x 6 months  2. HTN Stable No change required today Resume home medicine  Wound check in 10 days, follow-up with me in [place] in 3 months

## 2012-12-16 NOTE — Discharge Summary (Signed)
ELECTROPHYSIOLOGY PROCEDURE DISCHARGE SUMMARY    Patient ID: Alicia [name],  MRN: [id number], DOB/AGE: 1946-02-18 67 y.o.  Admit date: 12/13/2012 Discharge date: 12/16/2012  Primary Care Physician: Donzetta Sprung, MD Primary Cardiologist: new to [facility]  Primary Discharge Diagnosis:  Syncope and Mobitz II AV block s/p pacemaker implantation this admission  Secondary Discharge Diagnosis:  1.  Hypertension 2.  Obesity 3.  Diabetes 4.  DJD 5.  Hypothyroidism  Procedures This Admission:  1.  Implantation of a dual chamber pacemaker on 12-15-2012 by Dr Ladona Ridgel.  The patient received a STJ Assurity pacemaker with model number 2088 right atrial and right ventricular leads.  There were no early apparent complications. 2.  Echo on 12-14-2012 demonstrated EF 60-65%, no RWMA, grade 1 diastolic dysfunction.  3.  CXR on 12-16-2012 demonstrated no pneumothorax status post device implant  Brief HPI: Alicia [name] is a 67 y.o. female with a h/o DM, HTN, and obesity who presents for further evaluation of syncope. She reports being in her usual state of health yesterday. She was at an Orthopedic clinic having a shoulder Xray when she had sudden syncope. She reports that she had been standing for about 5 minutes. While having her xray, she had abrupt sweats with nausea immediately followed by syncope. She woke on the floor. She was observed overnight at [facility] and found to have mobitz II second degree AV block. She is therefore transferred to [facility] for further evaluation. She has been on no AV nodal agents recently.  Hospital Course:  The patient was admitted and underwent implantation of a STJ pacemaker with details as outlined above.   She was monitored on telemetry overnight which demonstrated sinus rhythm with intermittent atrial pacing.  Left chest was without hematoma or ecchymosis.  The device was interrogated and found to be functioning normally.  CXR was obtained and demonstrated no  pneumothorax status post device implantation.  Wound care, arm mobility, and restrictions were reviewed with the patient.  Dr Johney Frame examined the patient and considered them stable for discharge to home.    Discharge Vitals: Blood pressure 151/76, pulse 60, temperature 97.6 F (36.4 C), temperature source Oral, resp. rate 18, height 5\' 7"  (1.702 m), weight 229 lb 15 oz (104.3 kg), SpO2 96.00%.    Labs:   Lab Results  Component Value Date   WBC 7.3 12/14/2012   HGB 13.2 12/14/2012   HCT 37.7 12/14/2012   MCV 90.4 12/14/2012   PLT 243 12/14/2012     Recent Labs Lab 12/15/12 0405  NA 138  K 3.7  CL 102  CO2 26  BUN 24*  CREATININE 1.14*  CALCIUM 9.3  GLUCOSE 107*    Discharge Medications:    Medication List         aspirin EC 81 MG tablet  Take 81 mg by mouth every Monday, Wednesday, and Friday.     beta carotene w/minerals tablet  Take 1 tablet by mouth daily.     glipiZIDE 5 MG 24 hr tablet  Commonly known as:  GLUCOTROL XL  Take 5 mg by mouth daily.     levothyroxine 125 MCG tablet  Commonly known as:  SYNTHROID, LEVOTHROID  Take 125 mcg by mouth daily before breakfast.     lisinopril-hydrochlorothiazide 10-12.5 MG per tablet  Commonly known as:  PRINZIDE,ZESTORETIC  Take 1 tablet by mouth daily.     omeprazole 20 MG capsule  Commonly known as:  PRILOSEC  Take 20 mg by mouth  2 (two) times daily.     OVER THE COUNTER MEDICATION  Take 1 capsule by mouth daily. Mega Red Supplement     SYSTANE 0.4-0.3 % Soln  Generic drug:  Polyethyl Glycol-Propyl Glycol  Place 1 drop into both eyes as needed (dry eyes).        Disposition:  Discharge Orders   Future Appointments Provider Department Dept Phone   12/24/2012 4:00 PM [facility] 1 [address] Main Office [place]) [phone]   03/18/2013 9:00 AM Hillis Range, MD [place] [facility] [place] [phone]   Future Orders Complete By Expires     Diet - low sodium heart healthy  As  directed     Discharge instructions  As directed     Comments:      Please see post pacemaker implant discharge instructions    Increase activity slowly  As directed       Follow-up Information   Follow up with [facility] On 12/24/2012. (At 4:00 PM for wound check)    Contact information:   [facility] [address] [place] [address] [phone]      Follow up with Hillis Range, MD On 03/18/2013. (At 9:00 AM)    Contact information:   [facility] [address] [place] [address] [phone]      Duration of Discharge Encounter: Greater than 30 minutes including physician time.  Signed, Hillis Range, MD

## 2012-12-24 ENCOUNTER — Ambulatory Visit (INDEPENDENT_AMBULATORY_CARE_PROVIDER_SITE_OTHER): Payer: Medicare Other | Admitting: *Deleted

## 2012-12-24 DIAGNOSIS — I441 Atrioventricular block, second degree: Secondary | ICD-10-CM

## 2012-12-24 LAB — PACEMAKER DEVICE OBSERVATION
BAMS-0001: 150 {beats}/min
BAMS-0003: 70 {beats}/min
BATTERY VOLTAGE: 3.1584 V
RV LEAD AMPLITUDE: 12 mv

## 2012-12-24 NOTE — Progress Notes (Signed)
Wound check-PPM in office. 

## 2012-12-25 ENCOUNTER — Telehealth: Payer: Self-pay | Admitting: Internal Medicine

## 2012-12-25 NOTE — Telephone Encounter (Signed)
New problem   Pt has a pacer and was told to reset her house monitor.. Pt need to speak to someone to make sure it is transmitting. Please call her today.

## 2012-12-25 NOTE — Telephone Encounter (Signed)
[name] transmission received and all was normal. Pt had questions about alert notifier and also why ppm was not being used for VP. Questions were answered.

## 2012-12-25 NOTE — Telephone Encounter (Signed)
New problem   Pt has a pacer and was told to reset yopur

## 2012-12-25 NOTE — Telephone Encounter (Signed)
error 

## 2013-01-10 ENCOUNTER — Encounter: Payer: Self-pay | Admitting: Internal Medicine

## 2013-01-14 ENCOUNTER — Telehealth: Payer: Self-pay | Admitting: Internal Medicine

## 2013-01-14 NOTE — Telephone Encounter (Signed)
New Problem   Pt Pacemaker monitors//-feels really light headed and weak.. wants to know what the machine is recording and are her symptoms due to having this machine.requests a call back to review

## 2013-01-15 ENCOUNTER — Ambulatory Visit (INDEPENDENT_AMBULATORY_CARE_PROVIDER_SITE_OTHER): Payer: Medicare Other | Admitting: *Deleted

## 2013-01-15 DIAGNOSIS — I495 Sick sinus syndrome: Secondary | ICD-10-CM

## 2013-01-16 LAB — PACEMAKER DEVICE OBSERVATION

## 2013-01-16 NOTE — Progress Notes (Signed)
Pt seen in device clinic to follow up from [facility] transmission. Transmission showed episodes of PMT. VA conduction was checked and PVARP was set appropriately. Follow up as planned.

## 2013-01-16 NOTE — Telephone Encounter (Signed)
Pt sent transmission which showed episodes of PMT. Pt scheduled for device check in [place] clinic on 01-15-13 to check VA conduction. Pt aware of appointment.

## 2013-02-10 ENCOUNTER — Encounter: Payer: Self-pay | Admitting: Internal Medicine

## 2013-02-17 DIAGNOSIS — Z23 Encounter for immunization: Secondary | ICD-10-CM | POA: Diagnosis not present

## 2013-02-17 DIAGNOSIS — M25519 Pain in unspecified shoulder: Secondary | ICD-10-CM | POA: Diagnosis not present

## 2013-03-09 DIAGNOSIS — D492 Neoplasm of unspecified behavior of bone, soft tissue, and skin: Secondary | ICD-10-CM | POA: Diagnosis not present

## 2013-03-12 ENCOUNTER — Other Ambulatory Visit: Payer: Self-pay

## 2013-03-12 DIAGNOSIS — M25519 Pain in unspecified shoulder: Secondary | ICD-10-CM | POA: Diagnosis not present

## 2013-03-12 DIAGNOSIS — D492 Neoplasm of unspecified behavior of bone, soft tissue, and skin: Secondary | ICD-10-CM | POA: Diagnosis not present

## 2013-03-12 DIAGNOSIS — M2180 Other specified acquired deformities of unspecified limb: Secondary | ICD-10-CM | POA: Diagnosis not present

## 2013-03-18 ENCOUNTER — Other Ambulatory Visit (HOSPITAL_COMMUNITY): Payer: Self-pay | Admitting: Orthopedic Surgery

## 2013-03-18 ENCOUNTER — Encounter: Payer: Self-pay | Admitting: Internal Medicine

## 2013-03-18 ENCOUNTER — Ambulatory Visit (INDEPENDENT_AMBULATORY_CARE_PROVIDER_SITE_OTHER): Payer: Medicare Other | Admitting: Internal Medicine

## 2013-03-18 VITALS — BP 118/74 | HR 73 | Ht 67.0 in | Wt 237.0 lb

## 2013-03-18 DIAGNOSIS — R55 Syncope and collapse: Secondary | ICD-10-CM

## 2013-03-18 DIAGNOSIS — I10 Essential (primary) hypertension: Secondary | ICD-10-CM | POA: Insufficient documentation

## 2013-03-18 DIAGNOSIS — I495 Sick sinus syndrome: Secondary | ICD-10-CM | POA: Diagnosis not present

## 2013-03-18 DIAGNOSIS — I441 Atrioventricular block, second degree: Secondary | ICD-10-CM

## 2013-03-18 DIAGNOSIS — D492 Neoplasm of unspecified behavior of bone, soft tissue, and skin: Secondary | ICD-10-CM | POA: Diagnosis not present

## 2013-03-18 DIAGNOSIS — M899 Disorder of bone, unspecified: Secondary | ICD-10-CM

## 2013-03-18 LAB — MDC_IDC_ENUM_SESS_TYPE_INCLINIC
Date Time Interrogation Session: 20141112093026
Implantable Pulse Generator Model: 2240
Implantable Pulse Generator Serial Number: 7528878
Lead Channel Pacing Threshold Amplitude: 0.625 V
Lead Channel Pacing Threshold Amplitude: 0.625 V
Lead Channel Pacing Threshold Pulse Width: 0.4 ms
Lead Channel Sensing Intrinsic Amplitude: 12 mV
Lead Channel Setting Pacing Pulse Width: 0.4 ms

## 2013-03-18 NOTE — Patient Instructions (Signed)
   Stop Lisinopril / HCTZ Continue all other medications.   Your physician wants you to follow up in:  1 year.  You will receive a reminder letter in the mail one-two months in advance.  If you don't receive a letter, please call our office to schedule the follow up appointment - [place]

## 2013-03-18 NOTE — Progress Notes (Signed)
PCP: Donzetta Sprung, MD  Alicia [name] is a 67 y.o. female who presents today for routine electrophysiology followup.  Since having her pacemaker implanted, the patient reports doing very well.  Today, she denies symptoms of palpitations, chest pain, shortness of breath,  lower extremity edema, dizziness, presyncope, or syncope.  Her concern today is with cough which she attributes to lisinopril.  The patient is otherwise without complaint today.   Past Medical History  Diagnosis Date  . Obesity   . Hypertension   . Diabetes   . DJD (degenerative joint disease)   . Hypothyroidism   . Dysrhythmia     SYMPTOMATIC BRADYCARDIA  . Pacemaker 12/15/2012    DUAL CHAMBER    DR Ladona Ridgel  . Anemia    Past Surgical History  Procedure Laterality Date  . Cholecystectomy    . 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 Shoulder surgery Right   . Breast surgery      biopsy  . Knee surgery    . Ankle surgery Right     multiple    Current Outpatient Prescriptions  Medication Sig Dispense Refill  . aspirin EC 81 MG tablet Take 81 mg by mouth every Monday, Wednesday, and Friday.      . beta carotene w/minerals (OCUVITE) tablet Take 1 tablet by mouth daily.      . Cholecalciferol (VITAMIN D-3) 5000 UNITS TABS Take 5,000 Units by mouth daily.      [name] glipiZIDE (GLUCOTROL XL) 5 MG 24 hr tablet Take 5 mg by mouth daily.      [name] levothyroxine (SYNTHROID, LEVOTHROID) 125 MCG tablet Take 125 mcg by mouth daily before breakfast.      . omeprazole (PRILOSEC) 20 MG capsule Take 20 mg by mouth 2 (two) times daily.      [name] OVER THE COUNTER MEDICATION Take 1 capsule by mouth daily. Mega Red Supplement      . Polyethyl Glycol-Propyl Glycol (SYSTANE) 0.4-0.3 % SOLN Place 1 drop into both eyes as needed (dry eyes).      . Probiotic Product (PROBIOTIC DAILY PO) Take 1 tablet by mouth daily.       No current facility-administered  medications for this visit.    Physical Exam: Filed Vitals:   03/18/13 0902  BP: 118/74  Pulse: 73  Height: 5\' 7"  (1.702 m)  Weight: 237 lb (107.502 kg)    GEN- The patient is well appearing, alert and oriented x 3 today.   Head- normocephalic, atraumatic Eyes-  Sclera clear, conjunctiva pink Ears- hearing intact Oropharynx- clear Lungs- Clear to ausculation bilaterally, normal work of breathing Chest- pacemaker pocket is well healed Heart- Regular rate and rhythm, no murmurs, rubs or gallops, PMI not laterally displaced GI- soft, NT, ND, + BS Extremities- no clubbing, cyanosis, or edema  Pacemaker interrogation- reviewed in detail today,  See PACEART report  Assessment and Plan:  1. 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 HTN Stable Given cough, will stop lisinopril today.  Given prior dizziness I have also stopped HCTZ. If her cough resolves then she should start losartan.  If her cough does not improve off of Ace inhibitor then it could be resumed.  She will follow up with Dr Garner Nash for further discussion in January.  She is instructed to keep up with her BP  in the interim.  [name] Return in 1 year

## 2013-03-25 ENCOUNTER — Encounter: Payer: Medicare Other | Admitting: Internal Medicine

## 2013-03-25 ENCOUNTER — Ambulatory Visit (HOSPITAL_COMMUNITY)
Admission: RE | Admit: 2013-03-25 | Discharge: 2013-03-25 | Disposition: A | Discharge status: Home / Self Care | Payer: Medicare Other | Source: Ambulatory Visit | Attending: Orthopedic Surgery | Admitting: Orthopedic Surgery

## 2013-03-25 DIAGNOSIS — M25519 Pain in unspecified shoulder: Secondary | ICD-10-CM | POA: Diagnosis not present

## 2013-03-25 DIAGNOSIS — M899 Disorder of bone, unspecified: Secondary | ICD-10-CM

## 2013-03-25 DIAGNOSIS — M25819 Other specified joint disorders, unspecified shoulder: Secondary | ICD-10-CM | POA: Diagnosis not present

## 2013-03-30 DIAGNOSIS — D492 Neoplasm of unspecified behavior of bone, soft tissue, and skin: Secondary | ICD-10-CM | POA: Diagnosis not present

## 2013-04-07 DIAGNOSIS — D492 Neoplasm of unspecified behavior of bone, soft tissue, and skin: Secondary | ICD-10-CM | POA: Diagnosis not present

## 2013-04-10 DIAGNOSIS — D492 Neoplasm of unspecified behavior of bone, soft tissue, and skin: Secondary | ICD-10-CM | POA: Diagnosis not present

## 2013-04-14 DIAGNOSIS — D492 Neoplasm of unspecified behavior of bone, soft tissue, and skin: Secondary | ICD-10-CM | POA: Diagnosis not present

## 2013-04-17 DIAGNOSIS — D492 Neoplasm of unspecified behavior of bone, soft tissue, and skin: Secondary | ICD-10-CM | POA: Diagnosis not present

## 2013-04-20 DIAGNOSIS — D492 Neoplasm of unspecified behavior of bone, soft tissue, and skin: Secondary | ICD-10-CM | POA: Diagnosis not present

## 2013-05-11 DIAGNOSIS — M75120 Complete rotator cuff tear or rupture of unspecified shoulder, not specified as traumatic: Secondary | ICD-10-CM | POA: Diagnosis not present

## 2013-05-11 DIAGNOSIS — M19019 Primary osteoarthritis, unspecified shoulder: Secondary | ICD-10-CM | POA: Diagnosis not present

## 2013-05-14 DIAGNOSIS — M1990 Unspecified osteoarthritis, unspecified site: Secondary | ICD-10-CM | POA: Diagnosis not present

## 2013-05-14 DIAGNOSIS — E669 Obesity, unspecified: Secondary | ICD-10-CM | POA: Diagnosis not present

## 2013-05-14 DIAGNOSIS — IMO0001 Reserved for inherently not codable concepts without codable children: Secondary | ICD-10-CM | POA: Diagnosis not present

## 2013-05-14 DIAGNOSIS — E039 Hypothyroidism, unspecified: Secondary | ICD-10-CM | POA: Diagnosis not present

## 2013-05-14 DIAGNOSIS — E782 Mixed hyperlipidemia: Secondary | ICD-10-CM | POA: Diagnosis not present

## 2013-05-14 DIAGNOSIS — I10 Essential (primary) hypertension: Secondary | ICD-10-CM | POA: Diagnosis not present

## 2013-06-03 DIAGNOSIS — M1990 Unspecified osteoarthritis, unspecified site: Secondary | ICD-10-CM | POA: Diagnosis not present

## 2013-06-03 DIAGNOSIS — IMO0001 Reserved for inherently not codable concepts without codable children: Secondary | ICD-10-CM | POA: Diagnosis not present

## 2013-06-03 DIAGNOSIS — E8881 Metabolic syndrome: Secondary | ICD-10-CM | POA: Diagnosis not present

## 2013-06-03 DIAGNOSIS — E669 Obesity, unspecified: Secondary | ICD-10-CM | POA: Diagnosis not present

## 2013-06-03 DIAGNOSIS — E039 Hypothyroidism, unspecified: Secondary | ICD-10-CM | POA: Diagnosis not present

## 2013-06-03 DIAGNOSIS — E782 Mixed hyperlipidemia: Secondary | ICD-10-CM | POA: Diagnosis not present

## 2013-06-03 DIAGNOSIS — I10 Essential (primary) hypertension: Secondary | ICD-10-CM | POA: Diagnosis not present

## 2013-06-03 DIAGNOSIS — Z1331 Encounter for screening for depression: Secondary | ICD-10-CM | POA: Diagnosis not present

## 2013-06-11 ENCOUNTER — Telehealth: Payer: Self-pay | Admitting: Internal Medicine

## 2013-06-11 NOTE — Telephone Encounter (Signed)
LMOM for return call//kwm  

## 2013-06-11 NOTE — Telephone Encounter (Signed)
New Message  Pt called requests a call back to discuss how the Remote Pacer check is conducted// Please call

## 2013-06-15 DIAGNOSIS — M75120 Complete rotator cuff tear or rupture of unspecified shoulder, not specified as traumatic: Secondary | ICD-10-CM | POA: Diagnosis not present

## 2013-06-15 DIAGNOSIS — M6530 Trigger finger, unspecified finger: Secondary | ICD-10-CM | POA: Diagnosis not present

## 2013-06-15 DIAGNOSIS — M19019 Primary osteoarthritis, unspecified shoulder: Secondary | ICD-10-CM | POA: Diagnosis not present

## 2013-06-18 ENCOUNTER — Ambulatory Visit (INDEPENDENT_AMBULATORY_CARE_PROVIDER_SITE_OTHER): Payer: Medicare Other | Admitting: *Deleted

## 2013-06-18 DIAGNOSIS — I441 Atrioventricular block, second degree: Secondary | ICD-10-CM | POA: Diagnosis not present

## 2013-06-18 NOTE — Telephone Encounter (Signed)
LMOVM updating pt we automatically received her transmission.

## 2013-06-19 ENCOUNTER — Encounter: Payer: Self-pay | Admitting: Internal Medicine

## 2013-06-23 DIAGNOSIS — D485 Neoplasm of uncertain behavior of skin: Secondary | ICD-10-CM | POA: Diagnosis not present

## 2013-06-23 DIAGNOSIS — L570 Actinic keratosis: Secondary | ICD-10-CM | POA: Diagnosis not present

## 2013-06-23 DIAGNOSIS — L821 Other seborrheic keratosis: Secondary | ICD-10-CM | POA: Diagnosis not present

## 2013-06-24 LAB — MDC_IDC_ENUM_SESS_TYPE_REMOTE
Battery Voltage: 3.02 V
Brady Statistic RA Percent Paced: 18 %
Brady Statistic RV Percent Paced: 1 % — CL
Implantable Pulse Generator Model: 2240
Implantable Pulse Generator Serial Number: 7528878
Lead Channel Impedance Value: 540 Ohm
Lead Channel Impedance Value: 580 Ohm
Lead Channel Pacing Threshold Amplitude: 0.625 V
Lead Channel Pacing Threshold Amplitude: 0.75 V
Lead Channel Pacing Threshold Pulse Width: 0.4 ms
Lead Channel Pacing Threshold Pulse Width: 0.4 ms
Lead Channel Sensing Intrinsic Amplitude: 12 mV
Lead Channel Sensing Intrinsic Amplitude: 4.2 mV
Lead Channel Setting Pacing Amplitude: 0.875
Lead Channel Setting Pacing Amplitude: 1.625
Lead Channel Setting Pacing Pulse Width: 0.4 ms
Lead Channel Setting Sensing Sensitivity: 2 mV

## 2013-06-30 ENCOUNTER — Other Ambulatory Visit: Payer: Self-pay | Admitting: Orthopedic Surgery

## 2013-07-01 ENCOUNTER — Telehealth: Payer: Self-pay | Admitting: Internal Medicine

## 2013-07-01 ENCOUNTER — Other Ambulatory Visit: Payer: Self-pay

## 2013-07-01 ENCOUNTER — Encounter (HOSPITAL_BASED_OUTPATIENT_CLINIC_OR_DEPARTMENT_OTHER): Payer: Self-pay | Admitting: *Deleted

## 2013-07-01 NOTE — Telephone Encounter (Addendum)
Patient is scheduled to see Dr [name] on Friday.  He reviewed and says may proceed with surgery if medically necessary.

## 2013-07-01 NOTE — Telephone Encounter (Signed)
New message    Need clearance for rotator cuff surgery sched for 3-3.  If cleared, do you want her on telementry post op? Pt has an appt 2-27 with DR Allred

## 2013-07-01 NOTE — Progress Notes (Signed)
Pt seeing dr [name] 07/03/13 for an issue with her pacemaker-she can get her bmet done after Did call to ask for cardiac clearance and if drAllred needed her on telemetry post op-she is scheduled to stay over night with her shoulder scope-possible open repair.  We do not do telemetry here [facility]

## 2013-07-03 ENCOUNTER — Encounter: Payer: Self-pay | Admitting: Internal Medicine

## 2013-07-03 ENCOUNTER — Encounter (HOSPITAL_BASED_OUTPATIENT_CLINIC_OR_DEPARTMENT_OTHER)
Admission: RE | Admit: 2013-07-03 | Discharge: 2013-07-03 | Disposition: A | Discharge status: Home / Self Care | Payer: Medicare Other | Source: Ambulatory Visit | Attending: Orthopedic Surgery | Admitting: Orthopedic Surgery

## 2013-07-03 ENCOUNTER — Telehealth: Payer: Self-pay | Admitting: Internal Medicine

## 2013-07-03 ENCOUNTER — Ambulatory Visit (INDEPENDENT_AMBULATORY_CARE_PROVIDER_SITE_OTHER): Payer: Medicare Other | Admitting: Internal Medicine

## 2013-07-03 VITALS — BP 168/76 | HR 68 | Ht 67.0 in | Wt 240.4 lb

## 2013-07-03 DIAGNOSIS — I4891 Unspecified atrial fibrillation: Secondary | ICD-10-CM

## 2013-07-03 DIAGNOSIS — I10 Essential (primary) hypertension: Secondary | ICD-10-CM | POA: Diagnosis not present

## 2013-07-03 DIAGNOSIS — Z01812 Encounter for preprocedural laboratory examination: Secondary | ICD-10-CM | POA: Insufficient documentation

## 2013-07-03 DIAGNOSIS — I441 Atrioventricular block, second degree: Secondary | ICD-10-CM | POA: Diagnosis not present

## 2013-07-03 LAB — BASIC METABOLIC PANEL
BUN: 15 mg/dL (ref 6–23)
CHLORIDE: 100 meq/L (ref 96–112)
CO2: 28 meq/L (ref 19–32)
CREATININE: 0.85 mg/dL (ref 0.50–1.10)
Calcium: 9.3 mg/dL (ref 8.4–10.5)
GFR calc non Af Amer: 68 mL/min — ABNORMAL LOW (ref >90)
GFR, EST AFRICAN AMERICAN: 79 mL/min — AB (ref >90)
Glucose, Bld: 273 mg/dL — ABNORMAL HIGH (ref 70–99)
POTASSIUM: 4.2 meq/L (ref 3.7–5.3)
Sodium: 137 mEq/L (ref 137–147)

## 2013-07-03 MED ORDER — DILTIAZEM HCL ER COATED BEADS 180 MG PO CP24: 180.0000 mg | ORAL_CAPSULE | Freq: Every day | ORAL | Status: DC

## 2013-07-03 MED ORDER — RIVAROXABAN 20 MG PO TABS: 20.0000 mg | ORAL_TABLET | Freq: Every day | ORAL | Status: DC

## 2013-07-03 NOTE — Telephone Encounter (Signed)
Spoke with patient's pharmacy, it's the Xarelto that needs PA.  I gave her samples to get her started.  Spoke with patient and let her know we would try and get this approved

## 2013-07-03 NOTE — Progress Notes (Signed)
PCP: Gar Ponto, MD  Alicia [name] is a 68 y.o. female who presents today for routine electrophysiology followup.  She is having shoulder surgery next week.  Recent PPM remote revealed afib.  She is therefore brought in today for further assessment. Today, she denies symptoms of palpitations, chest pain, shortness of breath,  lower extremity edema, dizziness, presyncope, or syncope.  Her cough did not improve off of lisinopril.   She has rare fluttering with afib. In general her symptoms are mild.  The patient is otherwise without complaint today.   Past Medical History  Diagnosis Date  . Obesity   . Diabetes   . DJD (degenerative joint disease)   . Hypothyroidism   . Dysrhythmia     SYMPTOMATIC BRADYCARDIA  . 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 Anemia   . Hypertension     off meds at this time  . Wears glasses    Past Surgical History  Procedure Laterality Date  . Cholecystectomy    . 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 Shoulder surgery Right 2012  . Breast surgery      biopsy  . Knee surgery      right  . Ankle surgery Right     multiple  . Foot arthrotomy      left  . Colonoscopy    . Eye surgery      both cataracts    Current Outpatient Prescriptions  Medication Sig Dispense Refill  . glipiZIDE (GLUCOTROL XL) 5 MG 24 hr tablet Take 10 mg by mouth daily.       [name] levothyroxine (SYNTHROID, LEVOTHROID) 125 MCG tablet Take 125 mcg by mouth daily before breakfast.      . Multiple Vitamins-Minerals (ICAPS) CAPS Take 1 capsule by mouth daily.       [name] (SYSTANE) 0.4-0.3 % SOLN Place 1 drop into both eyes as needed (dry eyes).      . Probiotic Product (PROBIOTIC DAILY PO) Take 1 tablet by mouth daily.      [name] diltiazem (CARDIZEM CD) 180 MG 24 hr capsule Take 1 capsule (180 mg total) by mouth daily.  90 capsule  3  . Rivaroxaban (XARELTO) 20  MG TABS tablet Take 1 tablet (20 mg total) by mouth daily with supper.  30 tablet  11   No current facility-administered medications for this visit.    Physical Exam: Filed Vitals:   07/03/13 1025  BP: 168/76  Pulse: 68  Height: 5\' 7"  (1.702 m)  Weight: 240 lb 6.4 oz (109.045 kg)    GEN- The patient is well appearing, alert and oriented x 3 today.   Head- normocephalic, atraumatic Eyes-  Sclera clear, conjunctiva pink Ears- hearing intact Oropharynx- clear Lungs- Clear to ausculation bilaterally, normal work of breathing Chest- pacemaker pocket is well healed Heart- Regular rate and rhythm, no murmurs, rubs or gallops, PMI not laterally displaced GI- soft, NT, ND, + BS Extremities- no clubbing, cyanosis, or edema  Assessment and Plan:  1. 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 HTN Elevated Given her diabetes, she should restart either ace inhibitor or arb.  Her cough did not improve off of an ace inhibitor.  She will discuss with primary care Add diltiazem today (  see below)  3. Paroxysmal afib- this is a new diagnosis Discovered on PPM remote recently, longest episode is 2 hours.  V rates are mostly controlled Add diltiazem 180mg  daily chads2vasc score is 4.  I would therefore recommend long term anticoagulation. Today, I discussed coumadin novel anticoagulants including pradaxa, xarelto, and eliquis today as indicated for risk reduction in stroke and systemic emboli with nonvalvular atrial fibrillation.  Risks, benefits, and alternatives to each of these drugs were discussed at length today.  She would prefer eliquis.  I will therefore stop asa and start eliquis 5mg  BID today.  Return to see Jerene Pitch in follow-up in 3 months for new afib  [name] Return to see me in 1 year

## 2013-07-03 NOTE — Patient Instructions (Addendum)
Your physician recommends that you schedule a follow-up appointment in: 3 months with [place] physician has recommended you make the following change in your medication:  1) Start Diltiazem 180mg  daily 2) Start Xarelto 20mg  daily  Start Xarelto after surgery

## 2013-07-03 NOTE — Telephone Encounter (Signed)
New message    Diltiazem requires medicare approval---Dr Allred wanted pt to start medication today--talk to a nurse before the end of the day.

## 2013-07-06 ENCOUNTER — Telehealth: Payer: Self-pay | Admitting: *Deleted

## 2013-07-06 NOTE — Telephone Encounter (Signed)
[facility] approved the xarelto 20 mg through 07/07/2014, [place] # [id number].

## 2013-07-06 NOTE — Telephone Encounter (Signed)
PA to [facility] for xarelto 

## 2013-07-06 NOTE — H&P (Signed)
CARIANA KARGE is an 68 y.o. female.   Chief Complaint: c/o chronic pain and decreased ROM right shoulder HPI: . Zorina is a 68 year old retired [employer] who has had a number of procedures and care by Patrica Duel through the [facility]. She has been evaluated by Dr. Alphonzo Cruise and underwent a right shoulder arthroscopy with decompression in 2012. She has had numbness in her right hand and triggering of her right thumb and episodic triggering of her right index and long fingers. She notes a history of Raynaud's phenomenon. She has noted when palpating her back a bump near her medial scapular spine on the right. She states when she presses in this region she has pain that refers to her lateral brachium and posterior elbow region on the right. She is now referred for an upper extremity opinion regarding these symptoms.   Past Medical History  Diagnosis Date  . Obesity   . Diabetes   . DJD (degenerative joint disease)   . Hypothyroidism   . Dysrhythmia     SYMPTOMATIC BRADYCARDIA  . 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 Anemia   . Hypertension     off meds at this time  . Wears glasses     Past Surgical History  Procedure Laterality Date  . Cholecystectomy    . 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 Shoulder surgery Right 2012  . Breast surgery      biopsy  . Knee surgery      right  . Ankle surgery Right     multiple  . Foot arthrotomy      left  . Colonoscopy    . Eye surgery      both cataracts    Family History  Problem Relation Age of Onset  . Sudden death Brother     age 15s, died while playing raquetball   Social History:  reports that she has never smoked. She has never used smokeless tobacco. She reports that she does not drink alcohol or use illicit drugs.  Allergies:  Allergies  Allergen Reactions  . Sulfa Antibiotics Hives and Swelling   . Tape     Paper tape is okay  . Cortisone Rash  . Latex Rash    Sensitive not allergic    No prescriptions prior to admission    No results found for this or any previous visit (from the past 48 hour(s)).  No results found.   Pertinent items are noted in HPI.  Height 5\' 7"  (1.702 m), weight 107.502 kg (237 lb).  General appearance: alert Head: Normocephalic, without obvious abnormality Neck: supple, symmetrical, trachea midline Resp: clear to auscultation bilaterally Cardio: regular rate and rhythm GI: WNL Extremities: Inspection of her hands reveals no deformity. She has some stiffness of her neck. She has extension of her cervical spine 20 degrees, rotation right and left 80 degrees without radicular signs or symptoms. She has pain free forward flexion. She has active triggering of her right thumb at the A-1 pulley and crepitation at her right index and long finger A-1 pulleys. She has diminished sensibility in the median innervated digits consistent with carpal tunnel syndrome.  Inspection of her shoulder reveals well healed arthroscopic portals and an anterior saber type incision scar.   On palpation she is tender at her medial scapular spine. Palpation in this  region does cause referred pain to the lateral brachium and posterior aspect of the elbow. Her shoulder motion is preserved.   X-rays from [facility] 2 views of the shoulder are nondiagnostic. She does have arthritis at the [facility] joint.    She saw Rozetta Nunnery, one of our very experienced radiologists and had a detailed ultrasound examination of both shoulders.  Dr. Zigmund Daniel felt that her bump and pain focus is an anatomic variant of the scapular spine.  She does, indeed, have some reactive bone growth which could be the result of chronic tendonitis on her CT scan.  We do not identify any evidence of neoplasm. Her 2010 MRI of her shoulder is brought in for review.  I personally reviewed the films.  Kadesia, as it turns  out, has a rotator cuff tear despite her negative radiology report.  Blimie has what is known as a PASTA tear and an internal tendon tear.  This is visible on both the T-2 and T-2 images.    Pulses: 2+ and symmetric Skin: normal Neurologic: Grossly normal    Assessment/Plan Impression:  Right shoulder impingement with RC tear   Plan: To the OR for right SA with SAD/DCR and RC repair as needed.The procedure, risks,benefits and post-op course were discussed with the patient at length and they were in agreement with the plan.  DASNOIT,Mariacristina Aday J 07/06/2013, 2:17 PM  H&P documentation: 07/07/2013  -History and Physical Reviewed  -Patient has been re-examined  -No change in the plan of care  Cammie Sickle, MD

## 2013-07-07 ENCOUNTER — Ambulatory Visit (HOSPITAL_BASED_OUTPATIENT_CLINIC_OR_DEPARTMENT_OTHER): Payer: Medicare Other | Admitting: Anesthesiology

## 2013-07-07 ENCOUNTER — Encounter (HOSPITAL_BASED_OUTPATIENT_CLINIC_OR_DEPARTMENT_OTHER)
Admission: RE | Disposition: A | Discharge status: Home / Self Care | Payer: Self-pay | Source: Ambulatory Visit | Attending: Orthopedic Surgery

## 2013-07-07 ENCOUNTER — Ambulatory Visit (HOSPITAL_BASED_OUTPATIENT_CLINIC_OR_DEPARTMENT_OTHER)
Admission: RE | Admit: 2013-07-07 | Discharge: 2013-07-08 | Disposition: A | Discharge status: Home / Self Care | Payer: Medicare Other | Source: Ambulatory Visit | Attending: Orthopedic Surgery | Admitting: Orthopedic Surgery

## 2013-07-07 ENCOUNTER — Encounter (HOSPITAL_BASED_OUTPATIENT_CLINIC_OR_DEPARTMENT_OTHER): Payer: Medicare Other | Admitting: Anesthesiology

## 2013-07-07 ENCOUNTER — Encounter (HOSPITAL_BASED_OUTPATIENT_CLINIC_OR_DEPARTMENT_OTHER): Payer: Self-pay | Admitting: Anesthesiology

## 2013-07-07 DIAGNOSIS — Z882 Allergy status to sulfonamides status: Secondary | ICD-10-CM | POA: Insufficient documentation

## 2013-07-07 DIAGNOSIS — M719 Bursopathy, unspecified: Principal | ICD-10-CM | POA: Insufficient documentation

## 2013-07-07 DIAGNOSIS — M67919 Unspecified disorder of synovium and tendon, unspecified shoulder: Secondary | ICD-10-CM | POA: Diagnosis present

## 2013-07-07 DIAGNOSIS — M7580 Other shoulder lesions, unspecified shoulder: Secondary | ICD-10-CM

## 2013-07-07 DIAGNOSIS — E669 Obesity, unspecified: Secondary | ICD-10-CM | POA: Insufficient documentation

## 2013-07-07 DIAGNOSIS — M19019 Primary osteoarthritis, unspecified shoulder: Secondary | ICD-10-CM | POA: Diagnosis not present

## 2013-07-07 DIAGNOSIS — I4891 Unspecified atrial fibrillation: Secondary | ICD-10-CM | POA: Diagnosis not present

## 2013-07-07 DIAGNOSIS — Z9109 Other allergy status, other than to drugs and biological substances: Secondary | ICD-10-CM | POA: Insufficient documentation

## 2013-07-07 DIAGNOSIS — M25819 Other specified joint disorders, unspecified shoulder: Secondary | ICD-10-CM | POA: Diagnosis not present

## 2013-07-07 DIAGNOSIS — Z950 Presence of cardiac pacemaker: Secondary | ICD-10-CM | POA: Diagnosis not present

## 2013-07-07 DIAGNOSIS — S46819A Strain of other muscles, fascia and tendons at shoulder and upper arm level, unspecified arm, initial encounter: Secondary | ICD-10-CM | POA: Diagnosis not present

## 2013-07-07 DIAGNOSIS — E119 Type 2 diabetes mellitus without complications: Secondary | ICD-10-CM | POA: Diagnosis not present

## 2013-07-07 DIAGNOSIS — G8918 Other acute postprocedural pain: Secondary | ICD-10-CM | POA: Diagnosis not present

## 2013-07-07 DIAGNOSIS — M1990 Unspecified osteoarthritis, unspecified site: Secondary | ICD-10-CM | POA: Diagnosis not present

## 2013-07-07 DIAGNOSIS — Z91040 Latex allergy status: Secondary | ICD-10-CM | POA: Diagnosis not present

## 2013-07-07 DIAGNOSIS — E039 Hypothyroidism, unspecified: Secondary | ICD-10-CM | POA: Insufficient documentation

## 2013-07-07 DIAGNOSIS — I499 Cardiac arrhythmia, unspecified: Secondary | ICD-10-CM | POA: Insufficient documentation

## 2013-07-07 DIAGNOSIS — M898X9 Other specified disorders of bone, unspecified site: Secondary | ICD-10-CM | POA: Insufficient documentation

## 2013-07-07 DIAGNOSIS — M25519 Pain in unspecified shoulder: Secondary | ICD-10-CM | POA: Diagnosis not present

## 2013-07-07 DIAGNOSIS — I10 Essential (primary) hypertension: Secondary | ICD-10-CM | POA: Insufficient documentation

## 2013-07-07 DIAGNOSIS — D649 Anemia, unspecified: Secondary | ICD-10-CM | POA: Insufficient documentation

## 2013-07-07 DIAGNOSIS — S43499A Other sprain of unspecified shoulder joint, initial encounter: Secondary | ICD-10-CM | POA: Diagnosis not present

## 2013-07-07 HISTORY — DX: Presence of spectacles and contact lenses: Z97.3

## 2013-07-07 HISTORY — PX: SHOULDER ARTHROSCOPY WITH SUBACROMIAL DECOMPRESSION AND OPEN ROTATOR C: SHX5688

## 2013-07-07 LAB — GLUCOSE, CAPILLARY
GLUCOSE-CAPILLARY: 183 mg/dL — AB (ref 70–99)
Glucose-Capillary: 195 mg/dL — ABNORMAL HIGH (ref 70–99)
Glucose-Capillary: 191 mg/dL — ABNORMAL HIGH (ref 70–99)
Glucose-Capillary: 268 mg/dL — ABNORMAL HIGH (ref 70–99)
Glucose-Capillary: 289 mg/dL — ABNORMAL HIGH (ref 70–99)

## 2013-07-07 LAB — POCT HEMOGLOBIN-HEMACUE: Hemoglobin: 13.9 g/dL (ref 12.0–15.0)

## 2013-07-07 SURGERY — SHOULDER ARTHROSCOPY WITH SUBACROMIAL DECOMPRESSION AND OPEN ROTATOR CUFF REPAIR, OPEN BICEPS TENDON REPAIR
Anesthesia: General | Site: Shoulder | Laterality: Right

## 2013-07-07 MED ORDER — CEFAZOLIN SODIUM-DEXTROSE 2-3 GM-% IV SOLR
INTRAVENOUS | Status: AC
  Filled 2013-07-07: qty 50

## 2013-07-07 MED ORDER — ONDANSETRON HCL 4 MG/2ML IJ SOLN: 4.0000 mg | Freq: Four times a day (QID) | INTRAMUSCULAR | Status: DC | PRN

## 2013-07-07 MED ORDER — METHOCARBAMOL 100 MG/ML IJ SOLN: 500.0000 mg | Freq: Four times a day (QID) | INTRAVENOUS | Status: DC | PRN

## 2013-07-07 MED ORDER — FENTANYL CITRATE 0.05 MG/ML IJ SOLN
INTRAMUSCULAR | Status: DC | PRN
  Administered 2013-07-07: 50 ug via INTRAVENOUS

## 2013-07-07 MED ORDER — LACTATED RINGERS IV SOLN
INTRAVENOUS | Status: DC
  Administered 2013-07-07 (×2): via INTRAVENOUS

## 2013-07-07 MED ORDER — CEFAZOLIN SODIUM 1-5 GM-% IV SOLN
INTRAVENOUS | Status: AC
  Filled 2013-07-07: qty 100

## 2013-07-07 MED ORDER — METOCLOPRAMIDE HCL 5 MG/ML IJ SOLN: 5.0000 mg | Freq: Three times a day (TID) | INTRAMUSCULAR | Status: DC | PRN

## 2013-07-07 MED ORDER — OXYCODONE HCL 5 MG PO TABS: 5.0000 mg | ORAL_TABLET | Freq: Once | ORAL | Status: AC | PRN

## 2013-07-07 MED ORDER — HYDROMORPHONE HCL PF 1 MG/ML IJ SOLN: 0.5000 mg | INTRAMUSCULAR | Status: DC | PRN

## 2013-07-07 MED ORDER — SUCCINYLCHOLINE CHLORIDE 20 MG/ML IJ SOLN
INTRAMUSCULAR | Status: DC | PRN
  Administered 2013-07-07: 100 mg via INTRAVENOUS

## 2013-07-07 MED ORDER — MEPERIDINE HCL 25 MG/ML IJ SOLN: 6.2500 mg | INTRAMUSCULAR | Status: DC | PRN

## 2013-07-07 MED ORDER — OXYCODONE HCL 5 MG/5ML PO SOLN: 5.0000 mg | Freq: Once | ORAL | Status: AC | PRN

## 2013-07-07 MED ORDER — ROPIVACAINE HCL 5 MG/ML IJ SOLN
INTRAMUSCULAR | Status: DC | PRN
  Administered 2013-07-07: 30 mL via PERINEURAL

## 2013-07-07 MED ORDER — ONDANSETRON HCL 4 MG/2ML IJ SOLN
INTRAMUSCULAR | Status: DC | PRN
  Administered 2013-07-07: 4 mg via INTRAVENOUS

## 2013-07-07 MED ORDER — GLIPIZIDE ER 10 MG PO TB24
10.0000 mg | ORAL_TABLET | Freq: Every day | ORAL | Status: DC
  Administered 2013-07-07: 10 mg via ORAL

## 2013-07-07 MED ORDER — CEFAZOLIN SODIUM 1-5 GM-% IV SOLN
INTRAVENOUS | Status: AC
  Filled 2013-07-07: qty 50

## 2013-07-07 MED ORDER — DILTIAZEM HCL ER COATED BEADS 180 MG PO CP24
180.0000 mg | ORAL_CAPSULE | Freq: Every day | ORAL | Status: DC
  Administered 2013-07-07: 180 mg via ORAL

## 2013-07-07 MED ORDER — FENTANYL CITRATE 0.05 MG/ML IJ SOLN
INTRAMUSCULAR | Status: AC
  Filled 2013-07-07: qty 2

## 2013-07-07 MED ORDER — OXYCODONE-ACETAMINOPHEN 5-325 MG PO TABS
1.0000 | ORAL_TABLET | ORAL | Status: DC | PRN
  Administered 2013-07-07 (×2): 1 via ORAL
  Filled 2013-07-07: qty 2
  Filled 2013-07-07: qty 1

## 2013-07-07 MED ORDER — FENTANYL CITRATE 0.05 MG/ML IJ SOLN
50.0000 ug | INTRAMUSCULAR | Status: DC | PRN
  Administered 2013-07-07: 100 ug via INTRAVENOUS

## 2013-07-07 MED ORDER — PROPOFOL 10 MG/ML IV BOLUS
INTRAVENOUS | Status: DC | PRN
  Administered 2013-07-07: 150 mg via INTRAVENOUS
  Administered 2013-07-07 (×2): 50 mg via INTRAVENOUS

## 2013-07-07 MED ORDER — LIDOCAINE HCL (CARDIAC) 20 MG/ML IV SOLN
INTRAVENOUS | Status: DC | PRN
  Administered 2013-07-07: 100 mg via INTRAVENOUS

## 2013-07-07 MED ORDER — ONDANSETRON HCL 4 MG PO TABS: 4.0000 mg | ORAL_TABLET | Freq: Four times a day (QID) | ORAL | Status: DC | PRN

## 2013-07-07 MED ORDER — SODIUM CHLORIDE 0.9 % IV SOLN
INTRAVENOUS | Status: DC
Start: 2013-07-07 — End: 2013-07-08
  Administered 2013-07-07: 20 mL/h via INTRAVENOUS

## 2013-07-07 MED ORDER — CEFAZOLIN SODIUM 1-5 GM-% IV SOLN
1.0000 g | Freq: Four times a day (QID) | INTRAVENOUS | Status: AC
  Administered 2013-07-07 – 2013-07-08 (×3): 1 g via INTRAVENOUS

## 2013-07-07 MED ORDER — CHLORHEXIDINE GLUCONATE 4 % EX LIQD: 60.0000 mL | Freq: Once | CUTANEOUS | Status: DC

## 2013-07-07 MED ORDER — HYDROMORPHONE HCL 2 MG PO TABS: 2.0000 mg | ORAL_TABLET | ORAL | Status: DC | PRN

## 2013-07-07 MED ORDER — METHOCARBAMOL 500 MG PO TABS
500.0000 mg | ORAL_TABLET | Freq: Four times a day (QID) | ORAL | Status: DC | PRN
Start: 2013-07-07 — End: 2013-07-08
  Administered 2013-07-07 (×2): 500 mg via ORAL
  Filled 2013-07-07 (×2): qty 1

## 2013-07-07 MED ORDER — METOCLOPRAMIDE HCL 5 MG PO TABS: 5.0000 mg | ORAL_TABLET | Freq: Three times a day (TID) | ORAL | Status: DC | PRN

## 2013-07-07 MED ORDER — CEPHALEXIN 500 MG PO CAPS: 500.0000 mg | ORAL_CAPSULE | Freq: Three times a day (TID) | ORAL | Status: DC

## 2013-07-07 MED ORDER — DEXAMETHASONE SODIUM PHOSPHATE 10 MG/ML IJ SOLN
INTRAMUSCULAR | Status: DC | PRN
  Administered 2013-07-07: 4 mg

## 2013-07-07 MED ORDER — ONDANSETRON HCL 4 MG/2ML IJ SOLN: 4.0000 mg | Freq: Once | INTRAMUSCULAR | Status: AC | PRN

## 2013-07-07 MED ORDER — MIDAZOLAM HCL 2 MG/2ML IJ SOLN
1.0000 mg | INTRAMUSCULAR | Status: DC | PRN
  Administered 2013-07-07: 2 mg via INTRAVENOUS

## 2013-07-07 MED ORDER — HYDROMORPHONE HCL PF 1 MG/ML IJ SOLN: 0.2500 mg | INTRAMUSCULAR | Status: DC | PRN

## 2013-07-07 MED ORDER — CEFAZOLIN SODIUM-DEXTROSE 2-3 GM-% IV SOLR
2.0000 g | Freq: Once | INTRAVENOUS | Status: AC
  Administered 2013-07-07: 2 g via INTRAVENOUS

## 2013-07-07 MED ORDER — MIDAZOLAM HCL 2 MG/2ML IJ SOLN
INTRAMUSCULAR | Status: AC
  Filled 2013-07-07: qty 2

## 2013-07-07 MED ORDER — SODIUM CHLORIDE 0.9 % IR SOLN
Status: DC | PRN
  Administered 2013-07-07: 1100 mL

## 2013-07-07 MED ORDER — HYDROMORPHONE HCL 2 MG PO TABS: ORAL_TABLET | ORAL | Status: DC

## 2013-07-07 MED ORDER — LIDOCAINE HCL 4 % MT SOLN
OROMUCOSAL | Status: DC | PRN
  Administered 2013-07-07: 4 mL via TOPICAL

## 2013-07-07 SURGICAL SUPPLY — 81 items
BANDAGE ADH SHEER 1  50/CT (GAUZE/BANDAGES/DRESSINGS) IMPLANT
BLADE AVERAGE 25MMX9MM (BLADE)
BLADE AVERAGE 25X9 (BLADE) IMPLANT
BLADE CUTTER MENIS 5.5 (BLADE) IMPLANT
BLADE SURG 15 STRL LF DISP TIS (BLADE) ×2 IMPLANT
BLADE SURG 15 STRL SS (BLADE) ×6
BUR EGG 3PK/BX (BURR) IMPLANT
BUR OVAL 6.0 (BURR) ×3 IMPLANT
CANISTER SUCT 3000ML (MISCELLANEOUS) IMPLANT
CANNULA SHOULDER 7CM (CANNULA) IMPLANT
CANNULA TWIST IN 8.25X7CM (CANNULA) IMPLANT
CLEANER CAUTERY TIP 5X5 PAD (MISCELLANEOUS) IMPLANT
CLOSURE WOUND 1/2 X4 (GAUZE/BANDAGES/DRESSINGS) ×1
CUTTER MENISCUS  4.2MM (BLADE) ×2
CUTTER MENISCUS 4.2MM (BLADE) ×1 IMPLANT
DECANTER SPIKE VIAL GLASS SM (MISCELLANEOUS) IMPLANT
DRAPE INCISE IOBAN 66X45 STRL (DRAPES) ×3 IMPLANT
DRAPE STERI 35X30 U-POUCH (DRAPES) ×3 IMPLANT
DRAPE SURG 17X23 STRL (DRAPES) ×3 IMPLANT
DRAPE U-SHAPE 47X51 STRL (DRAPES) ×3 IMPLANT
DRAPE U-SHAPE 76X120 STRL (DRAPES) ×6 IMPLANT
DURAPREP 26ML APPLICATOR (WOUND CARE) ×3 IMPLANT
ELECT REM PT RETURN 9FT ADLT (ELECTROSURGICAL) ×3
ELECTRODE REM PT RTRN 9FT ADLT (ELECTROSURGICAL) IMPLANT
GLOVE BIO SURGEON STRL SZ 6.5 (GLOVE) ×1 IMPLANT
GLOVE BIO SURGEONS STRL SZ 6.5 (GLOVE) ×1
GLOVE BIOGEL M STRL SZ7.5 (GLOVE) ×3 IMPLANT
GLOVE BIOGEL PI IND STRL 7.0 (GLOVE) IMPLANT
GLOVE BIOGEL PI IND STRL 8 (GLOVE) ×2 IMPLANT
GLOVE BIOGEL PI INDICATOR 7.0 (GLOVE) ×2
GLOVE BIOGEL PI INDICATOR 8 (GLOVE) ×4
GLOVE ORTHO TXT STRL SZ7.5 (GLOVE) ×3 IMPLANT
GOWN STRL REUS W/ TWL LRG LVL3 (GOWN DISPOSABLE) ×1 IMPLANT
GOWN STRL REUS W/TWL LRG LVL3 (GOWN DISPOSABLE) ×3
GOWN STRL REUS W/TWL XL LVL4 (GOWN DISPOSABLE) ×6 IMPLANT
MANIFOLD NEPTUNE II (INSTRUMENTS) ×3 IMPLANT
NDL SCORPION (NEEDLE) ×1 IMPLANT
NDL SUT 6 .5 CRC .975X.05 MAYO (NEEDLE) IMPLANT
NEEDLE MAYO TAPER (NEEDLE)
NEEDLE MINI RC 24MM (NEEDLE) IMPLANT
NEEDLE SCORPION (NEEDLE) IMPLANT
PACK ARTHROSCOPY DSU (CUSTOM PROCEDURE TRAY) ×3 IMPLANT
PACK BASIN DAY SURGERY FS (CUSTOM PROCEDURE TRAY) ×3 IMPLANT
PAD ABD 8X10 STRL (GAUZE/BANDAGES/DRESSINGS) ×3 IMPLANT
PAD CLEANER CAUTERY TIP 5X5 (MISCELLANEOUS)
PASSER SUT SWANSON 36MM LOOP (INSTRUMENTS) IMPLANT
PENCIL BUTTON HOLSTER BLD 10FT (ELECTRODE) IMPLANT
SLEEVE SCD COMPRESS KNEE MED (MISCELLANEOUS) ×3 IMPLANT
SLING ARM LRG ADULT FOAM STRAP (SOFTGOODS) IMPLANT
SLING ARM MED ADULT FOAM STRAP (SOFTGOODS) IMPLANT
SLING ARM XL FOAM STRAP (SOFTGOODS) ×2 IMPLANT
SPONGE GAUZE 4X4 12PLY (GAUZE/BANDAGES/DRESSINGS) ×3 IMPLANT
SPONGE LAP 4X18 X RAY DECT (DISPOSABLE) IMPLANT
STRIP CLOSURE SKIN 1/2X4 (GAUZE/BANDAGES/DRESSINGS) ×1 IMPLANT
SUCTION FRAZIER TIP 10 FR DISP (SUCTIONS) IMPLANT
SUT FIBERWIRE #2 38 T-5 BLUE (SUTURE)
SUT FIBERWIRE 3-0 18 TAPR NDL (SUTURE)
SUT PROLENE 1 CT (SUTURE) IMPLANT
SUT PROLENE 3 0 PS 2 (SUTURE) ×3 IMPLANT
SUT TIGER TAPE 7 IN WHITE (SUTURE) IMPLANT
SUT VIC AB 0 CT1 27 (SUTURE)
SUT VIC AB 0 CT1 27XBRD ANBCTR (SUTURE) IMPLANT
SUT VIC AB 0 SH 27 (SUTURE) IMPLANT
SUT VIC AB 2-0 SH 27 (SUTURE)
SUT VIC AB 2-0 SH 27XBRD (SUTURE) IMPLANT
SUT VIC AB 3-0 SH 27 (SUTURE)
SUT VIC AB 3-0 SH 27X BRD (SUTURE) IMPLANT
SUT VIC AB 3-0 X1 27 (SUTURE) IMPLANT
SUTURE FIBERWR #2 38 T-5 BLUE (SUTURE) IMPLANT
SUTURE FIBERWR 3-0 18 TAPR NDL (SUTURE) IMPLANT
SYR 3ML 23GX1 SAFETY (SYRINGE) IMPLANT
SYR BULB 3OZ (MISCELLANEOUS) IMPLANT
TAPE FIBER 2MM 7IN #2 BLUE (SUTURE) IMPLANT
TAPE PAPER 3X10 WHT MICROPORE (GAUZE/BANDAGES/DRESSINGS) ×3 IMPLANT
TOWEL OR 17X24 6PK STRL BLUE (TOWEL DISPOSABLE) ×3 IMPLANT
TUBE CONNECTING 20'X1/4 (TUBING) ×2
TUBE CONNECTING 20X1/4 (TUBING) ×3 IMPLANT
TUBING ARTHROSCOPY IRRIG 16FT (MISCELLANEOUS) IMPLANT
WAND STAR VAC 90 (SURGICAL WAND) ×3 IMPLANT
WATER STERILE IRR 1000ML POUR (IV SOLUTION) ×3 IMPLANT
YANKAUER SUCT BULB TIP NO VENT (SUCTIONS) IMPLANT

## 2013-07-07 NOTE — Discharge Instructions (Addendum)
[facility] Instructions °Hand Surgery ° °Wound Care: °Keep your hand elevated above the level of your heart.  Do not allow it to dangle by your side.  Keep the dressing dry and do not remove it unless your doctor advises you to do so.  He will usually change it at the time of your post-op visit.  Moving your fingers is advised to stimulate circulation but will depend on the site of your surgery.  If you have a splint applied, your doctor will advise you regarding movement. ° °Activity: °Do not drive or operate machinery today.  Rest today and then you may return to your normal activity and work as indicated by your physician. ° °Diet:  °Drink liquids today or eat a light diet.  You may resume a regular diet tomorrow.   ° °General expectations: °Pain for two to three days. °Fingers may become slightly swollen. ° °Call your doctor if any of the following occur: °Severe pain not relieved by pain medication. °Elevated temperature. °Dressing soaked with blood. °Inability to move fingers. °White or bluish color to fingers. ° ° °Regional Anesthesia Blocks ° °1. Numbness or the inability to move the "blocked" extremity may last from 3-48 hours after placement. The length of time depends on the medication injected and your individual response to the medication. If the numbness is not going away after 48 hours, call your surgeon. ° °2. The extremity that is blocked will need to be protected until the numbness is gone and the  Strength has returned. Because you cannot feel it, you will need to take extra care to avoid injury. Because it may be weak, you may have difficulty moving it or using it. You may not know what position it is in without looking at it while the block is in effect. ° °3. For blocks in the legs and feet, returning to weight bearing and walking needs to be done carefully. You will need to wait until the numbness is entirely gone and the strength has returned. You should be able to move your leg and foot  normally before you try and bear weight or walk. You will need someone to be with you when you first try to ensure you do not fall and possibly risk injury. ° °4. Bruising and tenderness at the needle site are common side effects and will resolve in a few days. ° °5. Persistent numbness or new problems with movement should be communicated to the surgeon or the [facility] ([phone])/ [facility] ([phone]). ° ° °Post Anesthesia Home Care Instructions ° °Activity: °Get plenty of rest for the remainder of the day. A responsible adult should stay with you for 24 hours following the procedure.  °For the next 24 hours, DO NOT: °-Drive a car °-Operate machinery °-Drink alcoholic beverages °-Take any medication unless instructed by your physician °-Make any legal decisions or sign important papers. ° °Meals: °Start with liquid foods such as gelatin or soup. Progress to regular foods as tolerated. Avoid greasy, spicy, heavy foods. If nausea and/or vomiting occur, drink only clear liquids until the nausea and/or vomiting subsides. Call your physician if vomiting continues. ° °Special Instructions/Symptoms: °Your throat may feel dry or sore from the anesthesia or the breathing tube placed in your throat during surgery. If this causes discomfort, gargle with warm salt water. The discomfort should disappear within 24 hours. ° °

## 2013-07-07 NOTE — Op Note (Signed)
[id number] 

## 2013-07-07 NOTE — Anesthesia Preprocedure Evaluation (Signed)
Anesthesia Evaluation  Patient identified by MRN, date of birth, ID band Patient awake    Reviewed: Allergy & Precautions, H&P , NPO status , Patient's Chart, lab work & pertinent test results  Airway Mallampati: I TM Distance: >3 FB Neck ROM: Full    Dental   Pulmonary          Cardiovascular hypertension, Pt. on medications     Neuro/Psych    GI/Hepatic   Endo/Other  diabetes, Well Controlled, Type 2, Oral Hypoglycemic Agents  Renal/GU      Musculoskeletal   Abdominal   Peds  Hematology   Anesthesia Other Findings   Reproductive/Obstetrics                           Anesthesia Physical Anesthesia Plan  ASA: II  Anesthesia Plan: General   Post-op Pain Management:    Induction: Intravenous  Airway Management Planned: Oral ETT  Additional Equipment:   Intra-op Plan:   Post-operative Plan: Extubation in OR  Informed Consent: I have reviewed the patients History and Physical, chart, labs and discussed the procedure including the risks, benefits and alternatives for the proposed anesthesia with the patient or authorized representative who has indicated his/her understanding and acceptance.     Plan Discussed with: CRNA and Surgeon  Anesthesia Plan Comments:         Anesthesia Quick Evaluation

## 2013-07-07 NOTE — Progress Notes (Signed)
Assisted Dr. Ossey with right, ultrasound guided, interscalene  block. Side rails up, monitors on throughout procedure. See vital signs in flow sheet. Tolerated Procedure well. 

## 2013-07-07 NOTE — Anesthesia Procedure Notes (Addendum)
Anesthesia Regional Block:  Interscalene brachial plexus block  Pre-Anesthetic Checklist: ,, timeout performed, Correct Patient, Correct Site, Correct Laterality, Correct Procedure, Correct Position, site marked, Risks and benefits discussed,  Surgical consent,  Pre-op evaluation,  At surgeon's request and post-op pain management  Laterality: Right  Prep: chloraprep       Needles:  Injection technique: Single-shot  Needle Type: Echogenic Stimulator Needle     Needle Length: 9cm 9 cm Needle Gauge: 21 and 21 G    Additional Needles:  Procedures: ultrasound guided (picture in chart) and nerve stimulator Interscalene brachial plexus block  Nerve Stimulator or Paresthesia:  Response: 0.4 mA,   Additional Responses:   Narrative:  Start time: 07/07/2013 11:00 AM End time: 07/07/2013 11:10 AM Injection made incrementally with aspirations every 5 mL.  Performed by: Personally  Anesthesiologist: Lillia Abed MD  Additional Notes: Monitors applied. Patient sedated. Sterile prep and drape,hand hygiene and sterile gloves were used. Relevant anatomy identified.Needle position confirmed.Local anesthetic injected incrementally after negative aspiration. Local anesthetic spread visualized around nerve(s). Vascular puncture avoided. No complications. Image printed for medical record.The patient tolerated the procedure well.        Procedure Name: Intubation Date/Time: 07/07/2013 12:44 PM Performed by: Maryella Shivers Pre-anesthesia Checklist: Patient identified, Emergency Drugs available, Suction available and Patient being monitored Patient Re-evaluated:Patient Re-evaluated prior to inductionOxygen Delivery Method: Circle System Utilized Preoxygenation: Pre-oxygenation with 100% oxygen Intubation Type: IV induction Ventilation: Mask ventilation without difficulty Laryngoscope Size: Miller and 2 Grade View: Grade III Tube type: Oral Tube size: 7.0 mm Number of attempts: 1 Airway  Equipment and Method: stylet and oral airway Placement Confirmation: ETT inserted through vocal cords under direct vision,  positive ETCO2 and breath sounds checked- equal and bilateral Secured at: 22 cm Tube secured with: Tape Dental Injury: Teeth and Oropharynx as per pre-operative assessment

## 2013-07-07 NOTE — Transfer of Care (Signed)
Immediate Anesthesia Transfer of Care Note  Patient: Alicia [name]  Procedure(s) Performed: Procedure(s): RIGHT SHOULDER ARTHROSCOPY WITH SUBACROMIAL DECOMPRESSION AND ROTATOR CUFF DEBRIDEMENT/ DISTAL CLAVICLE RESECTION (Right)  Patient Location: PACU  Anesthesia Type:GA combined with regional for post-op pain  Level of Consciousness: awake, alert  and oriented  Airway & Oxygen Therapy: Patient Spontanous Breathing and Patient connected to face mask oxygen  Post-op Assessment: Report given to PACU RN and Post -op Vital signs reviewed and stable  Post vital signs: Reviewed and stable  Complications: No apparent anesthesia complications

## 2013-07-07 NOTE — Brief Op Note (Signed)
07/07/2013  1:40 PM  PATIENT:  Alicia [name]  68 y.o. female  PRE-OPERATIVE DIAGNOSIS:  RIGHT SHOULDER PASTA TEAR, RESIDUAL SUB ACROMIAL AND SUB CLAVICULAR IMPINGEMENT  POST-OPERATIVE DIAGNOSIS:  RIGHT SHOULDER IMPINGEMENT   PROCEDURE:  Procedure(s): RIGHT SHOULDER ARTHROSCOPY WITH SUBACROMIAL DECOMPRESSION AND ROTATOR CUFF DEBRIDEMENT/ DISTAL CLAVICLE RESECTION (Right)  SURGEON:  Surgeon(s) and Role:    * [name], MD - Primary  PHYSICIAN ASSISTANT:   ASSISTANTS:   Kathyrn Sheriff.A-C    ANESTHESIA:   general  EBL:  Total I/O In: 1000 [I.V.:1000] Out: -   BLOOD ADMINISTERED:none  DRAINS: none   LOCAL MEDICATIONS USED:  XYLOCAINE   SPECIMEN:  No Specimen  DISPOSITION OF SPECIMEN:  N/A  COUNTS:  YES  TOURNIQUET:  * No tourniquets in log *  DICTATION: .Other Dictation: Dictation Number [id number]  PLAN OF CARE: Admit for overnight observation  PATIENT DISPOSITION:  PACU - hemodynamically stable.   Delay start of Pharmacological VTE agent (>24hrs) due to surgical blood loss or risk of bleeding: not applicable

## 2013-07-07 NOTE — Anesthesia Postprocedure Evaluation (Signed)
Anesthesia Post Note  Patient: Alicia [name]  Procedure(s) Performed: Procedure(s) (LRB): RIGHT SHOULDER ARTHROSCOPY WITH SUBACROMIAL DECOMPRESSION AND ROTATOR CUFF DEBRIDEMENT/ DISTAL CLAVICLE RESECTION (Right)  Anesthesia type: general  Patient location: PACU  Post pain: Pain level controlled  Post assessment: Patient's Cardiovascular Status Stable  Last Vitals:  Filed Vitals:   07/07/13 1500  BP: 163/64  Pulse: 60  Temp: 36.3 C  Resp: 16    Post vital signs: Reviewed and stable  Level of consciousness: sedated  Complications: No apparent anesthesia complications

## 2013-07-08 ENCOUNTER — Encounter (HOSPITAL_BASED_OUTPATIENT_CLINIC_OR_DEPARTMENT_OTHER): Payer: Self-pay | Admitting: Orthopedic Surgery

## 2013-07-08 LAB — GLUCOSE, CAPILLARY: Glucose-Capillary: 267 mg/dL — ABNORMAL HIGH (ref 70–99)

## 2013-07-08 NOTE — Op Note (Signed)
NAMEBENTLY, MARHEFKA                  ACCOUNT NO.:  [id number]  MEDICAL RECORD NO.:  [id number]  LOCATION:                                 FACILITY:  PHYSICIAN:  Youlanda Mighty. Derran Sear, M.D. DATE OF BIRTH:  [birth year]  DATE OF PROCEDURE:  07/07/2013 DATE OF DISCHARGE:  07/08/2013                              OPERATIVE REPORT   PREOPERATIVE DIAGNOSES:  Chronic right shoulder pain predicament with concern about posterior scapular spine protuberance, status post negative ultrasound and CT scan evaluation by Dr. Zigmund Daniel of Radiology, also history of prior subacromial decompression and distal clavicle procedure with plain film and clinical evidence of residual impingement and possible partial articular supraspinatus tendon avulsion tear and tendinopathy of supraspinatus tendon/subscapularis tendon based on review of prior MRI.  POSTOPERATIVE DIAGNOSIS:  Residual impinging distal clavicle and residual or reformed anterior acromial osteophyte.  OPERATIONS: 1. Diagnostic arthroscopy, right glenohumeral joint. 2. Diagnostic subacromial arthroscopy with bursectomy, release of     coracoacromial ligament and acromioplasty leveling the acromion to     a type 1 morphology. 3. Arthroscopic distal clavicle resection. 4. Insufflation test of supraspinatus in an attempt to document a     delaminating PASTA lesion.  Ultimately, we elected to not     instrument the supraspinatus tendon.  OPERATING SURGEON:  Youlanda Mighty. Cindra Austad, M.D.  ASSISTANT:  Marily Lente Dasnoit, PA-C  ANESTHESIA:  General by endotracheal technique supplemented by a right preoperative ropivacaine plexus block.  SUPERVISING ANESTHESIOLOGIST:  Crissie Sickles. Conrad McClellanville, M.D.  INDICATIONS:  Darylene Price. Wogoman is a 68 year old woman referred through the courtesy of Dr. Gar Ponto of [place], [place] in November 2014.  Ms. Ancona has a history of multiple medical problems including hypertension, obesity, paroxysmal atrial fibrillation and is status  post pacemaker implantation in August of 2014.  She is under the care of [facility] and has a pacemaker implanted without a defibrillator.  She was advised to consider beginning Xarelto, but due to a number of predicaments including insurance failure to cover to date, she has not begun her Xarelto anticoagulation.  We have followed her for 4 months for her posterior shoulder pain predicament.  She was concerned about a bump along the scapular spine. She was status post a prior shoulder procedure with open attention to the acromion by Dr. Alphonzo Cruise performed in 2012 at [facility].  Due to the placement of her pacemaker, we could not obtain a contemporary MRI.  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  Ms. Armistead has failed several months of supervised and home-based physical therapy to her shoulder and neck.  She has had persistent discomfort, weakness of abduction, scaption, and abduction, external rotation at 90 degrees abduction.  Due to her persistent symptoms, we advised diagnostic arthroscopy anticipating subacromial decompression, distal clavicle resection, possible repair of a PASTA lesion if we confirm this at surgery.  Preoperatively, she was reminded the potential risks and benefits of surgery.  We confirmed that she had not started her Xarelto.  She was seen by the Pacemaker Team and had  her pacemaker managed properly for safe surgery.  After informed consent, she was brought to the operating room at this time.  Preoperatively, she was interviewed by Dr. Conrad Roberts in the holding area. He recommended general anesthesia by endotracheal technique supplemented by a right plexus block.  This was placed with ultrasound guidance without complication.  She had excellent anesthesia of the right arm  and forequarter.  DESCRIPTION OF PROCEDURE:  Zaleah Ternes. Sockwell was brought to room #2 of the [facility] and placed in supine position on the operating table.  Under Dr. Everlene Other direct supervision, general endotracheal anesthesia was induced.  At the onset of anesthesia during induction, we noted a transient systolic blood pressure of 252 mmHg.  This was immediately corrected by anesthetic agents to the range of systolic 010-932.  There were no untoward side effects noted at the time of induction other than a brief period of remarkable systolic hypertension.  Passive compression devices were applied to her calves prior to the induction of anesthesia.  Once she was under stable general anesthesia, she was carefully positioned in the beach-chair position with the aid of a torso and head holder designed for shoulder arthroscopy.  All bony prominences were padded and care was taken to assure that there were no tight compression areas from the seatbelt or shoulder harness.  The entire right upper extremity and forequarter were prepped with DuraPrep and draped with impervious arthroscopy drapes including waterproof stockinette.  Following routine surgical time-out and confirmation of 2 g of Ancef being administered as an IV prophylactic antibiotic, we proceeded to instrument the shoulder: A switching stick was used from an anterior approach to place the scope through the standard posterior viewing portal with blunt technique. Diagnostic arthroscopy revealed intact hyaline articular cartilage surfaces on the glenoid and humeral head.  There was minor degenerative changes of the labrum.  The long head of the biceps had a stable origin and was normal through the rotator interval.  Subscapularis was covered by a veil of synovium.  However with the posterior translation test, we could not identify any significant instability of the insertion of the subscapularis.  The supraspinatus  articular surface appeared to be intact.  The infraspinatus and teres minor were normal.  There were no loose bodies noted and no significant damage noted to the hyaline articular cartilage surfaces of the glenoid or inferior humeral head.  After photographic documentation of our findings, the scope was removed from the glenohumeral joint and placed in subacromial space.  There was a moderate degree of bursal adhesions between the clavicle, the anterior cuff and the acromion at the site of the prior acromioplasty.  There was fairly significant medial posterior acromial osteophyte adjacent to the former articulation of the clavicle.  There was a prominent inferior distal clavicle noted.  After release of the adhesions with the cutting cautery, the distal clavicle and acromion were documented with the digital camera. We then proceeded to use a suction bur to level the acromion to a type 1 morphology, obtaining hemostasis with the bipolar cautery.  The distal centimeter of clavicle was resected arthroscopically followed by hemostasis with the bipolar cautery.  Photographic documentation of the distal clavicle resection and the acromioplasty was accomplished.  We then carefully inspected the cuff.  There was a good vascular pattern on the bursal side of the cuff.  There was no full thickness bursal side tear.  There was an area of what appeared to be blistered tendon; however, on performing a saline  distention test, I could not identify convincingly a large enough PASTA lesion to warrant repair.  Multiple areas were sampled.  There was good pressure against the syringe.  We ultimately passed through the full thickness of the tendon and insufflated the joint multiple times looking for a possible bubble and did not identify one.  We replaced the scope in the glenohumeral joint and confirmed that we were indeed insufflating the joint proper without evidence of leak.  The scope was then  removed from the subacromial space and the portals were repaired with intradermal 3-0 Prolene.  Ms. Kensinger was placed in a sling and her wounds dressed with Steri-Strips, sterile gauze, and paper tape.  There were no apparent complications.  We will observe her in the recovery care unit overnight due to her multiple medical problems.  Her pacemaker will be monitored by the pacemaker team.  She will be provided Ancef 1 g IV q.8 hours x3 doses.  She will be provided p.o. and IV Dilaudid for postoperative comfort.     Youlanda Mighty Izmael Duross, M.D.     RVS/MEDQ  D:  07/07/2013  T:  07/08/2013  Job:  [id number]

## 2013-07-09 DIAGNOSIS — E782 Mixed hyperlipidemia: Secondary | ICD-10-CM | POA: Diagnosis not present

## 2013-07-09 DIAGNOSIS — IMO0001 Reserved for inherently not codable concepts without codable children: Secondary | ICD-10-CM | POA: Diagnosis not present

## 2013-07-09 DIAGNOSIS — K2100 Gastro-esophageal reflux disease with esophagitis, without bleeding: Secondary | ICD-10-CM | POA: Diagnosis not present

## 2013-07-09 DIAGNOSIS — I10 Essential (primary) hypertension: Secondary | ICD-10-CM | POA: Diagnosis not present

## 2013-07-10 DIAGNOSIS — M19019 Primary osteoarthritis, unspecified shoulder: Secondary | ICD-10-CM | POA: Diagnosis not present

## 2013-07-10 DIAGNOSIS — M75120 Complete rotator cuff tear or rupture of unspecified shoulder, not specified as traumatic: Secondary | ICD-10-CM | POA: Diagnosis not present

## 2013-07-17 DIAGNOSIS — M75120 Complete rotator cuff tear or rupture of unspecified shoulder, not specified as traumatic: Secondary | ICD-10-CM | POA: Diagnosis not present

## 2013-07-17 DIAGNOSIS — M19019 Primary osteoarthritis, unspecified shoulder: Secondary | ICD-10-CM | POA: Diagnosis not present

## 2013-07-27 DIAGNOSIS — M19019 Primary osteoarthritis, unspecified shoulder: Secondary | ICD-10-CM | POA: Diagnosis not present

## 2013-07-27 DIAGNOSIS — M75120 Complete rotator cuff tear or rupture of unspecified shoulder, not specified as traumatic: Secondary | ICD-10-CM | POA: Diagnosis not present

## 2013-07-28 DIAGNOSIS — I10 Essential (primary) hypertension: Secondary | ICD-10-CM | POA: Diagnosis not present

## 2013-08-03 DIAGNOSIS — M19019 Primary osteoarthritis, unspecified shoulder: Secondary | ICD-10-CM | POA: Diagnosis not present

## 2013-08-03 DIAGNOSIS — M75120 Complete rotator cuff tear or rupture of unspecified shoulder, not specified as traumatic: Secondary | ICD-10-CM | POA: Diagnosis not present

## 2013-08-10 DIAGNOSIS — M75120 Complete rotator cuff tear or rupture of unspecified shoulder, not specified as traumatic: Secondary | ICD-10-CM | POA: Diagnosis not present

## 2013-08-10 DIAGNOSIS — M19019 Primary osteoarthritis, unspecified shoulder: Secondary | ICD-10-CM | POA: Diagnosis not present

## 2013-08-19 ENCOUNTER — Telehealth: Payer: Self-pay | Admitting: Internal Medicine

## 2013-08-19 DIAGNOSIS — M19019 Primary osteoarthritis, unspecified shoulder: Secondary | ICD-10-CM | POA: Diagnosis not present

## 2013-08-19 DIAGNOSIS — M75120 Complete rotator cuff tear or rupture of unspecified shoulder, not specified as traumatic: Secondary | ICD-10-CM | POA: Diagnosis not present

## 2013-08-19 NOTE — Telephone Encounter (Signed)
New message     Having problems with bp----it is 189/87 at the highest.  Pt is on lorsartin.  Pt is also on xarelto.  It is making her sugar over 200.  Pt wanted to see Dr [name] today.  I told her he was only in the office this am.  She want to talk to a nurse

## 2013-08-19 NOTE — Telephone Encounter (Signed)
Spoke with pt concerning her complaints. Pt states that her BP is running high. Pt took her BP at home this a.m. And it was BP-159/69 HR-70; Pt then went to PCP office to have her BP rechecked by a nurse and it was BP- 163/67 HR-72. Pt also states that since she has been placed on Xarelto, her BS has been running high. Last CBG taken this morning was 198. Pt states that her BS usually runs between 110-130 range before she was on Xarelto. Pt also stated that when she woke up Monday morning, she noted that she had a "red blood clot in my mouth." Pt stated "it came up in my sleep." Pt states she is in no acute distress. Pt states she has no chest pain, no SOB, and has not been coughing. Instructed patient to keep proceeding with logging her BP and BS. Also told pt that I would forward this message to Dr. [name] and his nurse for review.  Instructed her if she has any unusual bleeding, SOB, or chest pain, then she needs to call 911.  Pt verbalized understanding.

## 2013-08-19 NOTE — Telephone Encounter (Signed)
She should follow-up with primary care for blood pressure concerns. I would suspect that the clot that she woke with in her mouth was likely from her nose (dried blood pooled while sleeping).  IF she has further episodes then she should follow-up with primary care on this also. She should keep her scheduled follow-up with Jerene Pitch in June

## 2013-08-20 DIAGNOSIS — IMO0001 Reserved for inherently not codable concepts without codable children: Secondary | ICD-10-CM | POA: Diagnosis not present

## 2013-08-20 DIAGNOSIS — R5381 Other malaise: Secondary | ICD-10-CM | POA: Diagnosis not present

## 2013-08-20 DIAGNOSIS — N3090 Cystitis, unspecified without hematuria: Secondary | ICD-10-CM | POA: Diagnosis not present

## 2013-08-20 DIAGNOSIS — I10 Essential (primary) hypertension: Secondary | ICD-10-CM | POA: Diagnosis not present

## 2013-08-20 DIAGNOSIS — R209 Unspecified disturbances of skin sensation: Secondary | ICD-10-CM | POA: Diagnosis not present

## 2013-08-20 DIAGNOSIS — M1990 Unspecified osteoarthritis, unspecified site: Secondary | ICD-10-CM | POA: Diagnosis not present

## 2013-08-20 DIAGNOSIS — R5383 Other fatigue: Secondary | ICD-10-CM | POA: Diagnosis not present

## 2013-08-20 NOTE — Telephone Encounter (Signed)
Spoke with Pt about Dr. Bonita Quin recommendation for pt to follow up with PCP for BP concerns. Told pt that Dr. [name] recommends her to also f/u with PCP if she has further episodes of clots in the mouth. Instructed pt to come to her scheduled follow up appt with Jerene Pitch in June. Pt verbalized understanding. No further complaints from pt.

## 2013-08-24 ENCOUNTER — Encounter: Payer: Self-pay | Admitting: Cardiology

## 2013-08-31 DIAGNOSIS — M75120 Complete rotator cuff tear or rupture of unspecified shoulder, not specified as traumatic: Secondary | ICD-10-CM | POA: Diagnosis not present

## 2013-08-31 DIAGNOSIS — M19019 Primary osteoarthritis, unspecified shoulder: Secondary | ICD-10-CM | POA: Diagnosis not present

## 2013-09-01 DIAGNOSIS — IMO0001 Reserved for inherently not codable concepts without codable children: Secondary | ICD-10-CM | POA: Diagnosis not present

## 2013-09-01 DIAGNOSIS — M129 Arthropathy, unspecified: Secondary | ICD-10-CM | POA: Diagnosis not present

## 2013-09-01 DIAGNOSIS — I10 Essential (primary) hypertension: Secondary | ICD-10-CM | POA: Diagnosis not present

## 2013-09-21 ENCOUNTER — Encounter: Payer: Self-pay | Admitting: Internal Medicine

## 2013-09-21 ENCOUNTER — Ambulatory Visit (INDEPENDENT_AMBULATORY_CARE_PROVIDER_SITE_OTHER): Payer: Medicare Other | Admitting: *Deleted

## 2013-09-21 DIAGNOSIS — I441 Atrioventricular block, second degree: Secondary | ICD-10-CM | POA: Diagnosis not present

## 2013-09-21 LAB — MDC_IDC_ENUM_SESS_TYPE_REMOTE
Battery Remaining Longevity: 135 mo
Battery Remaining Percentage: 95.5 %
Brady Statistic AP VS Percent: 28 %
Brady Statistic AS VP Percent: 1 %
Brady Statistic RA Percent Paced: 28 %
Date Time Interrogation Session: 20150518060011
Lead Channel Impedance Value: 490 Ohm
Lead Channel Impedance Value: 580 Ohm
Lead Channel Pacing Threshold Amplitude: 0.5 V
Lead Channel Pacing Threshold Pulse Width: 0.4 ms
Lead Channel Sensing Intrinsic Amplitude: 12 mV
Lead Channel Sensing Intrinsic Amplitude: 4.1 mV
Lead Channel Setting Pacing Amplitude: 0.875
Lead Channel Setting Pacing Pulse Width: 0.4 ms
Lead Channel Setting Sensing Sensitivity: 2 mV
MDC IDC MSMT BATTERY VOLTAGE: 3.01 V
MDC IDC MSMT LEADCHNL RA PACING THRESHOLD PULSEWIDTH: 0.4 ms
MDC IDC MSMT LEADCHNL RV PACING THRESHOLD AMPLITUDE: 0.625 V
MDC IDC PG SERIAL: 7528878
MDC IDC SET LEADCHNL RA PACING AMPLITUDE: 1.5 V
MDC IDC STAT BRADY AP VP PERCENT: 1 %
MDC IDC STAT BRADY AS VS PERCENT: 71 %
MDC IDC STAT BRADY RV PERCENT PACED: 1 %

## 2013-09-21 NOTE — Progress Notes (Signed)
Remote pacemaker transmission.   

## 2013-09-23 DIAGNOSIS — M75120 Complete rotator cuff tear or rupture of unspecified shoulder, not specified as traumatic: Secondary | ICD-10-CM | POA: Diagnosis not present

## 2013-09-23 DIAGNOSIS — M19019 Primary osteoarthritis, unspecified shoulder: Secondary | ICD-10-CM | POA: Diagnosis not present

## 2013-10-02 ENCOUNTER — Encounter: Payer: Self-pay | Admitting: Cardiology

## 2013-10-02 DIAGNOSIS — E669 Obesity, unspecified: Secondary | ICD-10-CM | POA: Insufficient documentation

## 2013-10-02 DIAGNOSIS — E119 Type 2 diabetes mellitus without complications: Secondary | ICD-10-CM | POA: Diagnosis not present

## 2013-10-02 DIAGNOSIS — M949 Disorder of cartilage, unspecified: Secondary | ICD-10-CM | POA: Diagnosis not present

## 2013-10-02 DIAGNOSIS — E039 Hypothyroidism, unspecified: Secondary | ICD-10-CM | POA: Diagnosis not present

## 2013-10-02 DIAGNOSIS — M899 Disorder of bone, unspecified: Secondary | ICD-10-CM | POA: Diagnosis not present

## 2013-10-02 DIAGNOSIS — E1169 Type 2 diabetes mellitus with other specified complication: Secondary | ICD-10-CM | POA: Insufficient documentation

## 2013-10-16 ENCOUNTER — Ambulatory Visit: Payer: Medicare Other | Admitting: Cardiology

## 2013-10-20 ENCOUNTER — Ambulatory Visit (INDEPENDENT_AMBULATORY_CARE_PROVIDER_SITE_OTHER): Payer: Medicare Other | Admitting: Cardiology

## 2013-10-20 VITALS — BP 126/54 | HR 60 | Resp 16 | Wt 241.8 lb

## 2013-10-20 DIAGNOSIS — I441 Atrioventricular block, second degree: Secondary | ICD-10-CM | POA: Diagnosis not present

## 2013-10-20 DIAGNOSIS — I480 Paroxysmal atrial fibrillation: Secondary | ICD-10-CM

## 2013-10-20 DIAGNOSIS — I10 Essential (primary) hypertension: Secondary | ICD-10-CM

## 2013-10-20 DIAGNOSIS — I4891 Unspecified atrial fibrillation: Secondary | ICD-10-CM | POA: Diagnosis not present

## 2013-10-20 DIAGNOSIS — Z950 Presence of cardiac pacemaker: Secondary | ICD-10-CM

## 2013-10-21 DIAGNOSIS — M1990 Unspecified osteoarthritis, unspecified site: Secondary | ICD-10-CM | POA: Diagnosis not present

## 2013-10-21 DIAGNOSIS — E782 Mixed hyperlipidemia: Secondary | ICD-10-CM | POA: Diagnosis not present

## 2013-10-21 DIAGNOSIS — M129 Arthropathy, unspecified: Secondary | ICD-10-CM | POA: Diagnosis not present

## 2013-10-21 NOTE — Progress Notes (Signed)
ELECTROPHYSIOLOGY OFFICE NOTE   Patient ID: SWAN FAIRFAX MRN: [id number], DOB/AGE: [birth year]   Date of Visit: 10/20/2013  Primary Physician: Gar Ponto, MD Primary Cardiologist / EP: Thompson Grayer, MD Reason for Visit: EP/device follow-up  History of Present Illness  Alicia [name] is a 68 y.o. 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 Since last being seen in our clinic, she reports she is doing well and has no complaints. She denies chest pain or shortness of breath. She denies palpitations, dizziness, near syncope or syncope. She denies LE swelling, orthopnea or PND. She reports fatigue and headache while taking diltiazem which has been discontinued. She is compliant with medications.   Past Medical History Past Medical History  Diagnosis Date  . Obesity   . Diabetes   . DJD (degenerative joint disease)   . Hypothyroidism   . Dysrhythmia     SYMPTOMATIC BRADYCARDIA  . 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 Anemia   . Hypertension     off meds at this time  . Wears glasses     Past Surgical History Past Surgical History  Procedure Laterality Date  . Cholecystectomy    . 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 Shoulder surgery Right 2012  . Breast surgery      biopsy  . Knee surgery      right  . Ankle surgery Right     multiple  . Foot arthrotomy      left  . Colonoscopy    . Eye surgery      both cataracts  . Shoulder arthroscopy with subacromial decompression and open rotator c Right 07/07/2013    Procedure: RIGHT SHOULDER ARTHROSCOPY WITH SUBACROMIAL DECOMPRESSION AND ROTATOR CUFF DEBRIDEMENT/ DISTAL CLAVICLE RESECTION;  Surgeon: [name], MD;  Location: [place];  Service: Orthopedics;  Laterality: Right;      Allergies/Intolerances Allergies  Allergen Reactions  . Ace Inhibitors Cough  . Cardizem [Diltiazem Hcl] Other (See Comments)    headache  . Statins Other (See Comments)    myalgias  . Sulfa Antibiotics Hives and Swelling  . Tape     Paper tape is okay  . Cortisone Rash    flushing  . Latex Rash    Sensitive not allergic    Current Home Medications Current Outpatient Prescriptions  Medication Sig Dispense Refill  . apixaban (ELIQUIS) 5 MG TABS tablet Take 5 mg by mouth 2 (two) times daily.      . insulin NPH-regular Human (NOVOLIN 70/30) (70-30) 100 UNIT/ML injection Inject 25 Units into the skin 2 (two) times daily with a meal.      . levothyroxine (SYNTHROID, LEVOTHROID) 125 MCG tablet Take 100 mcg by mouth daily before breakfast.       . lisinopril-hydrochlorothiazide (PRINZIDE,ZESTORETIC) 10-12.5 MG per tablet Take 1 tablet by mouth daily.      . Multiple Vitamins-Minerals (ICAPS) CAPS Take 1 capsule by mouth daily.       . pantoprazole (PROTONIX) 40 MG tablet Take 40 mg by mouth 2 (two) times daily before a meal.      . Polyethyl Glycol-Propyl Glycol (SYSTANE) 0.4-0.3 % SOLN Place 1 drop into both eyes as needed (dry eyes).      [name]  Probiotic Product (PROBIOTIC DAILY PO) Take 1 tablet by mouth daily.       No current facility-administered medications for this visit.    Social History History   Social History  . Marital Status: Married    Spouse Name: N/A    Number of Children: N/A  . Years of Education: N/A   Occupational History  . Not on file.   Social History Main Topics  . Smoking status: Never Smoker   . Smokeless tobacco: Never Used  . Alcohol Use: No  . Drug Use: No  . Sexual Activity: Not on file   Other Topics Concern  . Not on file   Social History Narrative   Retired and lives in [place] with spouse     Review of Systems General: No chills, fever, night sweats or weight changes Cardiovascular: No chest pain, dyspnea on exertion, edema,  orthopnea, palpitations, paroxysmal nocturnal dyspnea Dermatological: No rash, lesions or masses Respiratory: No cough, dyspnea Urologic: No hematuria, dysuria Abdominal: No nausea, vomiting, diarrhea, bright red blood per rectum, melena, or hematemesis Neurologic: No visual changes, weakness, changes in mental status All other systems reviewed and are otherwise negative except as noted above.  Physical Exam Vitals: Blood pressure 126/54, pulse 60, resp. rate 16, weight 241 lb 12.8 oz (109.68 kg).  General: Well developed, well appearing 68 y.o. female in no acute distress. HEENT: Normocephalic, atraumatic. EOMs intact. Sclera nonicteric. Oropharynx clear.  Neck: Supple. No JVD. Lungs: Respirations regular and unlabored, CTA bilaterally. No wheezes, rales or rhonchi. Heart: RRR. S1, S2 present. No murmurs, rub, S3 or S4. Abdomen: Soft, non-distended.  Extremities: No clubbing, cyanosis or edema. PT/Radials 2+ and equal bilaterally. Psych: Normal affect. Neuro: Alert and oriented X 3. Moves all extremities spontaneously.   Diagnostics  Echocardiogram August 2014 Study Conclusions - Left ventricle: The cavity size was normal. Wall thickness was normal. Systolic function was normal. The estimated ejection fraction was in the range of 60% to 65%. Wall motion was normal; there were no regional wall motion abnormalities. Doppler parameters are consistent with abnormal left ventricular relaxation (grade 1 diastolic dysfunction). - Mitral valve: Calcified annulus. - Left atrium: The atrium was mildly dilated.  Device interrogation today - Normal device function. Thresholds, sensing, impedances consistent with previous measurements. Device programmed to maximize longevity. 5 mode switch episodes, longest 3 hours 10 minutes, EGMs reviewed and consistent with AFib. +Eliquis. No high ventricular rates noted. Device programmed at appropriate safety margins. Histogram distribution appropriate for  patient activity level. Device programmed to optimize intrinsic conduction. Estimated longevity 10.7-11.5 years.   Assessment and Plan  1. 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 [name] in Feb 2016  2. HTN  Normotensive today Continue current regimen  3. Paroxysmal atrial fibrillation Histograms show overall rate controlled  Did not tolerate diltiazem Chads2vasc score is 4 therefore long-term anticoagulation is indicated Continue Eliquis  Signed, EDMISTEN, BROOKE, PA-C 10/21/2013, 3:29 PM

## 2013-10-26 ENCOUNTER — Encounter: Payer: Self-pay | Admitting: Cardiology

## 2013-10-26 LAB — MDC_IDC_ENUM_SESS_TYPE_INCLINIC
Battery Remaining Longevity: 138 mo
Battery Voltage: 3.01 V
Date Time Interrogation Session: 20150616153849
Implantable Pulse Generator Model: 2240
Implantable Pulse Generator Serial Number: 7528878
Lead Channel Impedance Value: 460 Ohm
Lead Channel Pacing Threshold Amplitude: 0.75 V
Lead Channel Pacing Threshold Pulse Width: 0.4 ms
Lead Channel Pacing Threshold Pulse Width: 0.4 ms
Lead Channel Sensing Intrinsic Amplitude: 12 mV
Lead Channel Setting Pacing Amplitude: 0.875
Lead Channel Setting Pacing Pulse Width: 0.4 ms
MDC IDC MSMT LEADCHNL RA PACING THRESHOLD AMPLITUDE: 0.5 V
MDC IDC MSMT LEADCHNL RA SENSING INTR AMPL: 4.2 mV
MDC IDC MSMT LEADCHNL RV IMPEDANCE VALUE: 550 Ohm
MDC IDC SET LEADCHNL RA PACING AMPLITUDE: 1.5 V
MDC IDC SET LEADCHNL RV SENSING SENSITIVITY: 2 mV
MDC IDC STAT BRADY RA PERCENT PACED: 28 %
MDC IDC STAT BRADY RV PERCENT PACED: 0.94 %

## 2013-11-03 DIAGNOSIS — E039 Hypothyroidism, unspecified: Secondary | ICD-10-CM | POA: Diagnosis not present

## 2013-11-03 DIAGNOSIS — E119 Type 2 diabetes mellitus without complications: Secondary | ICD-10-CM | POA: Diagnosis not present

## 2013-11-11 ENCOUNTER — Telehealth: Payer: Self-pay | Admitting: Internal Medicine

## 2013-11-11 ENCOUNTER — Encounter: Payer: Self-pay | Admitting: Internal Medicine

## 2013-11-11 NOTE — Telephone Encounter (Signed)
Left message for patient, no alert noted on [name].

## 2013-11-11 NOTE — Telephone Encounter (Signed)
New message     Pt had an "episode" yesterday and want to know if it showed up on her pacemaker.  Please call

## 2013-11-16 DIAGNOSIS — IMO0001 Reserved for inherently not codable concepts without codable children: Secondary | ICD-10-CM | POA: Diagnosis not present

## 2013-11-16 DIAGNOSIS — I10 Essential (primary) hypertension: Secondary | ICD-10-CM | POA: Diagnosis not present

## 2013-11-16 DIAGNOSIS — K2100 Gastro-esophageal reflux disease with esophagitis, without bleeding: Secondary | ICD-10-CM | POA: Diagnosis not present

## 2013-11-16 DIAGNOSIS — M1990 Unspecified osteoarthritis, unspecified site: Secondary | ICD-10-CM | POA: Diagnosis not present

## 2013-11-16 DIAGNOSIS — E782 Mixed hyperlipidemia: Secondary | ICD-10-CM | POA: Diagnosis not present

## 2013-11-19 DIAGNOSIS — E669 Obesity, unspecified: Secondary | ICD-10-CM | POA: Diagnosis not present

## 2013-11-19 DIAGNOSIS — Z1331 Encounter for screening for depression: Secondary | ICD-10-CM | POA: Diagnosis not present

## 2013-11-19 DIAGNOSIS — E8881 Metabolic syndrome: Secondary | ICD-10-CM | POA: Diagnosis not present

## 2013-11-19 DIAGNOSIS — I10 Essential (primary) hypertension: Secondary | ICD-10-CM | POA: Diagnosis not present

## 2013-11-19 DIAGNOSIS — E782 Mixed hyperlipidemia: Secondary | ICD-10-CM | POA: Diagnosis not present

## 2013-11-19 DIAGNOSIS — E039 Hypothyroidism, unspecified: Secondary | ICD-10-CM | POA: Diagnosis not present

## 2013-11-19 DIAGNOSIS — IMO0001 Reserved for inherently not codable concepts without codable children: Secondary | ICD-10-CM | POA: Diagnosis not present

## 2013-11-19 DIAGNOSIS — M1990 Unspecified osteoarthritis, unspecified site: Secondary | ICD-10-CM | POA: Diagnosis not present

## 2014-01-04 DIAGNOSIS — E669 Obesity, unspecified: Secondary | ICD-10-CM | POA: Diagnosis not present

## 2014-01-04 DIAGNOSIS — E119 Type 2 diabetes mellitus without complications: Secondary | ICD-10-CM | POA: Diagnosis not present

## 2014-01-04 DIAGNOSIS — E039 Hypothyroidism, unspecified: Secondary | ICD-10-CM | POA: Diagnosis not present

## 2014-01-21 ENCOUNTER — Telehealth: Payer: Self-pay | Admitting: Cardiology

## 2014-01-21 ENCOUNTER — Ambulatory Visit (INDEPENDENT_AMBULATORY_CARE_PROVIDER_SITE_OTHER): Payer: Medicare Other | Admitting: *Deleted

## 2014-01-21 ENCOUNTER — Encounter: Payer: Self-pay | Admitting: Internal Medicine

## 2014-01-21 ENCOUNTER — Telehealth: Payer: Self-pay | Admitting: Internal Medicine

## 2014-01-21 DIAGNOSIS — I441 Atrioventricular block, second degree: Secondary | ICD-10-CM | POA: Diagnosis not present

## 2014-01-21 NOTE — Telephone Encounter (Signed)
LMOVM reminding pt to send remote transmission.   

## 2014-01-21 NOTE — Telephone Encounter (Signed)
Spoke with pt she stated that the lights on her home monitor were blinking orange and it was beeping. I informed pt to call tech services. She verbalized understanding.

## 2014-01-21 NOTE — Telephone Encounter (Signed)
New message      Trying to do a remote transmission---the box is beeping.

## 2014-01-21 NOTE — Progress Notes (Signed)
Remote pacemaker transmission.   

## 2014-01-22 LAB — MDC_IDC_ENUM_SESS_TYPE_REMOTE
Battery Remaining Percentage: 95.5 %
Battery Voltage: 3.01 V
Brady Statistic AP VP Percent: 1 %
Brady Statistic AS VP Percent: 1 %
Brady Statistic AS VS Percent: 71 %
Brady Statistic RV Percent Paced: 1 %
Implantable Pulse Generator Model: 2240
Implantable Pulse Generator Serial Number: 7528878
Lead Channel Impedance Value: 480 Ohm
Lead Channel Impedance Value: 510 Ohm
Lead Channel Pacing Threshold Amplitude: 0.375 V
Lead Channel Pacing Threshold Amplitude: 0.625 V
Lead Channel Pacing Threshold Pulse Width: 0.4 ms
Lead Channel Sensing Intrinsic Amplitude: 12 mV
Lead Channel Setting Pacing Amplitude: 1.375
Lead Channel Setting Pacing Pulse Width: 0.4 ms
Lead Channel Setting Sensing Sensitivity: 2 mV
MDC IDC MSMT BATTERY REMAINING LONGEVITY: 133 mo
MDC IDC MSMT LEADCHNL RA SENSING INTR AMPL: 4.6 mV
MDC IDC MSMT LEADCHNL RV PACING THRESHOLD PULSEWIDTH: 0.4 ms
MDC IDC SESS DTM: 20150917060022
MDC IDC SET LEADCHNL RV PACING AMPLITUDE: 0.875
MDC IDC STAT BRADY AP VS PERCENT: 28 %
MDC IDC STAT BRADY RA PERCENT PACED: 28 %

## 2014-01-26 ENCOUNTER — Encounter: Payer: Self-pay | Admitting: Cardiology

## 2014-01-28 ENCOUNTER — Encounter: Payer: Self-pay | Admitting: Cardiology

## 2014-03-04 DIAGNOSIS — E039 Hypothyroidism, unspecified: Secondary | ICD-10-CM | POA: Diagnosis not present

## 2014-03-04 DIAGNOSIS — E782 Mixed hyperlipidemia: Secondary | ICD-10-CM | POA: Diagnosis not present

## 2014-03-04 DIAGNOSIS — M1990 Unspecified osteoarthritis, unspecified site: Secondary | ICD-10-CM | POA: Diagnosis not present

## 2014-03-04 DIAGNOSIS — I10 Essential (primary) hypertension: Secondary | ICD-10-CM | POA: Diagnosis not present

## 2014-03-04 DIAGNOSIS — E1165 Type 2 diabetes mellitus with hyperglycemia: Secondary | ICD-10-CM | POA: Diagnosis not present

## 2014-03-15 DIAGNOSIS — E1165 Type 2 diabetes mellitus with hyperglycemia: Secondary | ICD-10-CM | POA: Diagnosis not present

## 2014-03-15 DIAGNOSIS — I10 Essential (primary) hypertension: Secondary | ICD-10-CM | POA: Diagnosis not present

## 2014-03-15 DIAGNOSIS — E8881 Metabolic syndrome: Secondary | ICD-10-CM | POA: Diagnosis not present

## 2014-03-15 DIAGNOSIS — E6609 Other obesity due to excess calories: Secondary | ICD-10-CM | POA: Diagnosis not present

## 2014-03-15 DIAGNOSIS — E782 Mixed hyperlipidemia: Secondary | ICD-10-CM | POA: Diagnosis not present

## 2014-03-15 DIAGNOSIS — E039 Hypothyroidism, unspecified: Secondary | ICD-10-CM | POA: Diagnosis not present

## 2014-03-15 DIAGNOSIS — K210 Gastro-esophageal reflux disease with esophagitis: Secondary | ICD-10-CM | POA: Diagnosis not present

## 2014-04-01 ENCOUNTER — Emergency Department: Payer: Self-pay | Admitting: Emergency Medicine

## 2014-04-01 DIAGNOSIS — Z79899 Other long term (current) drug therapy: Secondary | ICD-10-CM | POA: Diagnosis not present

## 2014-04-01 DIAGNOSIS — Z7902 Long term (current) use of antithrombotics/antiplatelets: Secondary | ICD-10-CM | POA: Diagnosis not present

## 2014-04-01 DIAGNOSIS — I10 Essential (primary) hypertension: Secondary | ICD-10-CM | POA: Diagnosis not present

## 2014-04-01 DIAGNOSIS — I4891 Unspecified atrial fibrillation: Secondary | ICD-10-CM | POA: Diagnosis not present

## 2014-04-01 DIAGNOSIS — E119 Type 2 diabetes mellitus without complications: Secondary | ICD-10-CM | POA: Diagnosis not present

## 2014-04-01 DIAGNOSIS — Z91040 Latex allergy status: Secondary | ICD-10-CM | POA: Diagnosis not present

## 2014-04-01 DIAGNOSIS — Z794 Long term (current) use of insulin: Secondary | ICD-10-CM | POA: Diagnosis not present

## 2014-04-01 LAB — CBC WITH DIFFERENTIAL/PLATELET
BASOS ABS: 0.1 10*3/uL (ref 0.0–0.1)
Basophil %: 1 %
EOS ABS: 0.1 10*3/uL (ref 0.0–0.7)
EOS PCT: 1.4 %
HCT: 41.5 % (ref 35.0–47.0)
HGB: 13.4 g/dL (ref 12.0–16.0)
LYMPHS ABS: 1.3 10*3/uL (ref 1.0–3.6)
LYMPHS PCT: 15.4 %
MCH: 30.9 pg (ref 26.0–34.0)
MCHC: 32.3 g/dL (ref 32.0–36.0)
MCV: 96 fL (ref 80–100)
Monocyte #: 0.5 x10 3/mm (ref 0.2–0.9)
Monocyte %: 6.2 %
Neutrophil #: 6.6 10*3/uL — ABNORMAL HIGH (ref 1.4–6.5)
Neutrophil %: 76 %
Platelet: 210 10*3/uL (ref 150–440)
RBC: 4.33 10*6/uL (ref 3.80–5.20)
RDW: 13.2 % (ref 11.5–14.5)
WBC: 8.7 10*3/uL (ref 3.6–11.0)

## 2014-04-01 LAB — URINALYSIS, COMPLETE
Bilirubin,UR: NEGATIVE
Blood: NEGATIVE
Glucose,UR: NEGATIVE mg/dL (ref 0–75)
Ketone: NEGATIVE
Leukocyte Esterase: NEGATIVE
Nitrite: NEGATIVE
Ph: 5 (ref 4.5–8.0)
Protein: NEGATIVE
RBC, UR: NONE SEEN /HPF (ref 0–5)
Specific Gravity: 1.013 (ref 1.003–1.030)
WBC UR: 4 /HPF (ref 0–5)

## 2014-04-01 LAB — BASIC METABOLIC PANEL
ANION GAP: 5 — AB (ref 7–16)
BUN: 19 mg/dL — ABNORMAL HIGH (ref 7–18)
CHLORIDE: 104 mmol/L (ref 98–107)
CREATININE: 1.24 mg/dL (ref 0.60–1.30)
Calcium, Total: 8.4 mg/dL — ABNORMAL LOW (ref 8.5–10.1)
Co2: 31 mmol/L (ref 21–32)
EGFR (African American): 55 — ABNORMAL LOW
EGFR (Non-African Amer.): 46 — ABNORMAL LOW
Glucose: 188 mg/dL — ABNORMAL HIGH (ref 65–99)
OSMOLALITY: 287 (ref 275–301)
POTASSIUM: 4.4 mmol/L (ref 3.5–5.1)
SODIUM: 140 mmol/L (ref 136–145)

## 2014-04-01 LAB — TROPONIN I

## 2014-04-02 ENCOUNTER — Telehealth: Payer: Self-pay | Admitting: Internal Medicine

## 2014-04-02 NOTE — Telephone Encounter (Signed)
New message  Pt called had an event over the past two days went to the ER wanted to check with Device clinic to download her merlin and find out what happened//Please call back to discuss

## 2014-04-02 NOTE — Telephone Encounter (Signed)
Spoke with patient who wanted to let us know that she had an episode last night that sent her to the ER.  Message sent to Dr. [name] to see what he would like for follow with this patient.

## 2014-04-05 DIAGNOSIS — E669 Obesity, unspecified: Secondary | ICD-10-CM | POA: Diagnosis not present

## 2014-04-05 DIAGNOSIS — E118 Type 2 diabetes mellitus with unspecified complications: Secondary | ICD-10-CM | POA: Diagnosis not present

## 2014-04-05 DIAGNOSIS — E039 Hypothyroidism, unspecified: Secondary | ICD-10-CM | POA: Diagnosis not present

## 2014-04-06 ENCOUNTER — Telehealth: Payer: Self-pay | Admitting: Internal Medicine

## 2014-04-06 NOTE — Telephone Encounter (Signed)
New Msg   Patient had an event on Thanksgiving and was in ER on with Afib. Patient would like to be contacted at [phone].

## 2014-04-06 NOTE — Telephone Encounter (Signed)
Patient is calling back because she wasn't contacted yesterday. Please advise. Patient is requesting an appointment due to being seen at [facility] over the weekend because of her device going off.

## 2014-04-06 NOTE — Telephone Encounter (Signed)
Alicia [name] spoke with patient on 11/27 and sent the message to Dr [name] for question as to follow up.  She was seen in the ER on 11/26 with a what she says her device showed was an episode of afib lasting for 2 hours. She was previously taken off of Diltiazem due to HA.  Her HR were in the 180's per patient and she was started on Cartia 120mg  daily.  She says her husband told her she passed out in the car and for this reason she says the ER MD told  Her not to drive until seen by her Cardiologist.  She wants to know when she should follow up.  I let her know I would discuss with Dr. [name] tomorrow and call her back

## 2014-04-09 ENCOUNTER — Encounter: Payer: Self-pay | Admitting: Internal Medicine

## 2014-04-09 NOTE — Telephone Encounter (Signed)
I do not see Paula's original message.  I did have Amber call the patient today and a remote transmission was obtained.  This revealed AF 11/26 with RVR.  She has since been in sinus rhythm with no other arrhythmias.  I offered follow-up in the office today which the patient declined.  She would like to see me the next time I am in [place].  She is therefore added to my schedule for 04/16/14.

## 2014-04-15 ENCOUNTER — Encounter (HOSPITAL_COMMUNITY): Payer: Self-pay | Admitting: Internal Medicine

## 2014-04-16 ENCOUNTER — Encounter: Payer: Self-pay | Admitting: Internal Medicine

## 2014-04-16 ENCOUNTER — Ambulatory Visit (INDEPENDENT_AMBULATORY_CARE_PROVIDER_SITE_OTHER): Payer: Medicare Other | Admitting: Internal Medicine

## 2014-04-16 VITALS — BP 122/59 | HR 61 | Ht 67.0 in | Wt 244.0 lb

## 2014-04-16 DIAGNOSIS — I441 Atrioventricular block, second degree: Secondary | ICD-10-CM

## 2014-04-16 DIAGNOSIS — I480 Paroxysmal atrial fibrillation: Secondary | ICD-10-CM

## 2014-04-16 DIAGNOSIS — I10 Essential (primary) hypertension: Secondary | ICD-10-CM | POA: Diagnosis not present

## 2014-04-16 LAB — MDC_IDC_ENUM_SESS_TYPE_INCLINIC
Brady Statistic RV Percent Paced: 0.93 %
Implantable Pulse Generator Model: 2240
Lead Channel Impedance Value: 512.5 Ohm
Lead Channel Pacing Threshold Pulse Width: 0.4 ms
Lead Channel Pacing Threshold Pulse Width: 0.4 ms
Lead Channel Sensing Intrinsic Amplitude: 12 mV
Lead Channel Setting Pacing Amplitude: 1.5 V
Lead Channel Setting Pacing Pulse Width: 0.4 ms
Lead Channel Setting Sensing Sensitivity: 2 mV
MDC IDC MSMT BATTERY REMAINING LONGEVITY: 133.2 mo
MDC IDC MSMT BATTERY VOLTAGE: 3.01 V
MDC IDC MSMT LEADCHNL RA IMPEDANCE VALUE: 475 Ohm
MDC IDC MSMT LEADCHNL RA PACING THRESHOLD AMPLITUDE: 0.5 V
MDC IDC MSMT LEADCHNL RA SENSING INTR AMPL: 4 mV
MDC IDC MSMT LEADCHNL RV PACING THRESHOLD AMPLITUDE: 0.5 V
MDC IDC PG SERIAL: 7528878
MDC IDC SESS DTM: 20151211134904
MDC IDC SET LEADCHNL RV PACING AMPLITUDE: 0.75 V
MDC IDC STAT BRADY RA PERCENT PACED: 28 %

## 2014-04-16 MED ORDER — DILTIAZEM HCL 30 MG PO TABS: 30.0000 mg | ORAL_TABLET | Freq: Four times a day (QID) | ORAL | Status: DC | PRN

## 2014-04-16 NOTE — Patient Instructions (Signed)
Your physician recommends that you schedule a follow-up appointment in: February 2016 with Dr. [name]. Your physician has recommended you make the following change in your medication:  Stop cartia xt 120 mg. Start diltiazem 30 mg every 6 hours as needed. Continue all other medications the same.

## 2014-04-17 NOTE — Progress Notes (Signed)
PCP: Gar Ponto, MD  Alicia [name] is a 68 y.o. female who presents today for add on visit for electrophysiology followup.  She reports doing very well until 04/01/14 when she and her husband were driving back from [place] to [place].  She develeopd abrupt tachypalpitations with fatigue and SOB.  She was very alarmed when her symptoms persisted and she was therefore taken to [facility].  She was placed on diltiazem after being found to have converted to atrial fibrillation.  She spontaneously returned to sinus rhythm.  She was discharged on daily diltiazem.  She states that she feels very tired and fatigue with this medicine.  She has had no further AF.   Today, she denies symptoms of further palpitations, chest pain, shortness of breath,  lower extremity edema, dizziness, presyncope, or syncope.   The patient is otherwise without complaint today.   Past Medical History  Diagnosis Date  . Obesity   . Diabetes   . DJD (degenerative joint disease)   . Hypothyroidism   . Dysrhythmia     SYMPTOMATIC BRADYCARDIA  . 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 Anemia   . Hypertension     off meds at this time  . Wears glasses    Past Surgical History  Procedure Laterality Date  . Cholecystectomy    . 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 Shoulder surgery Right 2012  . Breast surgery      biopsy  . Knee surgery      right  . Ankle surgery Right     multiple  . Foot arthrotomy      left  . Colonoscopy    . Eye surgery      both cataracts  . Shoulder arthroscopy with subacromial decompression and open rotator c Right 07/07/2013    Procedure: RIGHT SHOULDER ARTHROSCOPY WITH SUBACROMIAL DECOMPRESSION AND ROTATOR CUFF DEBRIDEMENT/ DISTAL CLAVICLE RESECTION;  Surgeon: [name], MD;  Location: [place];  Service: Orthopedics;  Laterality: Right;  . Permanent pacemaker  insertion N/A 12/15/2012    Procedure: PERMANENT PACEMAKER INSERTION;  Surgeon: [name], MD;  Location: [facility] CATH LAB;  Service: Cardiovascular;  Laterality: N/A;    Current Outpatient Prescriptions  Medication Sig Dispense Refill  . apixaban (ELIQUIS) 5 MG TABS tablet Take 5 mg by mouth 2 (two) times daily.    . insulin NPH-regular Human (NOVOLIN 70/30) (70-30) 100 UNIT/ML injection Inject 48 Units into the skin 2 (two) times daily with a meal.     . levothyroxine (SYNTHROID, LEVOTHROID) 125 MCG tablet Take 125 mcg by mouth daily before breakfast.     . lisinopril-hydrochlorothiazide (PRINZIDE,ZESTORETIC) 10-12.5 MG per tablet Take 1 tablet by mouth daily.    . Multiple Vitamins-Minerals (ICAPS) CAPS Take 1 capsule by mouth daily.     [name] (SYSTANE) 0.4-0.3 % SOLN Place 1 drop into both eyes as needed (dry eyes).    . Probiotic Product (PROBIOTIC DAILY PO) Take 1 tablet by mouth daily.    [name] diltiazem (CARDIZEM) 30 MG tablet Take 1 tablet (30 mg total) by mouth every 6 (six) hours as needed. 30 tablet 1   No current facility-administered medications for this visit.    Physical Exam: Filed Vitals:   04/16/14 0820  BP: 122/59  Pulse: 61  Height: 5\' 7"  (1.702 m)  Weight: 244 lb (110.678 kg)  SpO2: 99%    GEN- The patient is well appearing, alert and oriented x 3 today.   Head- normocephalic, atraumatic Eyes-  Sclera clear, conjunctiva pink Ears- hearing intact Oropharynx- clear Lungs- Clear to ausculation bilaterally, normal work of breathing Chest- pacemaker pocket is well healed Heart- Regular rate and rhythm, no murmurs, rubs or gallops, PMI not laterally displaced GI- soft, NT, ND, + BS Extremities- no clubbing, cyanosis, or edema  Assessment and Plan:  1. 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 Paroxysmal afib  She has only rare episodes of AF. She does not  tolerate daily diltiazem and does not wish to try AADs at this time. I will therefore stop diltiazem CD and initiate short acting diltiazem 30mg  q6h prn that she can take during an episode of AF.  We could consider using flecainide as a pill in pocket medicine if needed going forward chads2vasc score is 4.  She is doing well with eliquis.  3. HTN Stable No change required today  4. Obesity Body mass index is 38.21 kg/(m^2). Weight loss is advised  [name] Return to see me in February 2/16

## 2014-04-23 ENCOUNTER — Encounter: Payer: Self-pay | Admitting: Internal Medicine

## 2014-04-26 ENCOUNTER — Telehealth: Payer: Self-pay | Admitting: Cardiology

## 2014-04-26 ENCOUNTER — Encounter: Payer: Self-pay | Admitting: Internal Medicine

## 2014-04-26 NOTE — Telephone Encounter (Signed)
Pt called an stated that on thanksgiving day she got dizzy, and nauseas. She stated that she was in [place] during the first episode. MD is aware of episode on November 11 b/c she spoke w/ him about this at the 04-16-14 appt. Then again today she felt the same way. She stated that it comes on all of a sudden. Pt was started on diltiazem 30 mg every six hours as needed. Pt stated that today is this first time she has had to take any of this medicine.

## 2014-04-27 NOTE — Telephone Encounter (Signed)
Spoke with patient.  04/26/14 had an episode of what she thought was A-fib with RVR and took 30mg  of Diltiazem as ordered by Dr. [name] and then another later last night.  [name] transmission reviewed and shows NSR-> ST with rates of 100bpm.  No A-fib, PMT or VHR episodes noted.  Patient was instructed not to take any more Diltiazem today and if she has another episode to download to [facility] and call us.  I will show this to Dr. [name] when he is in the office 04/28/14.

## 2014-04-27 NOTE — Telephone Encounter (Signed)
Spoke w pt and informed her that after consulting w/ device tech I was informed that if she had an episode it would have showed up on that report. Pt wanted some guidance as what to do. Forwarded call to device tech.

## 2014-06-11 ENCOUNTER — Encounter: Payer: Self-pay | Admitting: *Deleted

## 2014-06-11 ENCOUNTER — Ambulatory Visit (INDEPENDENT_AMBULATORY_CARE_PROVIDER_SITE_OTHER): Payer: Medicare Other | Admitting: Internal Medicine

## 2014-06-11 ENCOUNTER — Encounter: Payer: Self-pay | Admitting: Internal Medicine

## 2014-06-11 VITALS — BP 131/73 | HR 55 | Ht 66.0 in | Wt 240.8 lb

## 2014-06-11 DIAGNOSIS — I441 Atrioventricular block, second degree: Secondary | ICD-10-CM

## 2014-06-11 DIAGNOSIS — I480 Paroxysmal atrial fibrillation: Secondary | ICD-10-CM | POA: Diagnosis not present

## 2014-06-11 DIAGNOSIS — R5383 Other fatigue: Secondary | ICD-10-CM | POA: Diagnosis not present

## 2014-06-11 DIAGNOSIS — R0602 Shortness of breath: Secondary | ICD-10-CM | POA: Diagnosis not present

## 2014-06-11 DIAGNOSIS — G4733 Obstructive sleep apnea (adult) (pediatric): Secondary | ICD-10-CM | POA: Diagnosis not present

## 2014-06-11 LAB — MDC_IDC_ENUM_SESS_TYPE_INCLINIC
Battery Remaining Longevity: 142.8 mo
Battery Voltage: 3.01 V
Brady Statistic RA Percent Paced: 27 %
Brady Statistic RV Percent Paced: 1 %
Date Time Interrogation Session: 20160205122809
Implantable Pulse Generator Serial Number: 7528878
Lead Channel Impedance Value: 487.5 Ohm
Lead Channel Impedance Value: 512.5 Ohm
Lead Channel Pacing Threshold Amplitude: 0.5 V
Lead Channel Pacing Threshold Pulse Width: 0.4 ms
Lead Channel Sensing Intrinsic Amplitude: 4 mV
Lead Channel Setting Pacing Amplitude: 0.875
MDC IDC MSMT LEADCHNL RV PACING THRESHOLD AMPLITUDE: 0.625 V
MDC IDC MSMT LEADCHNL RV PACING THRESHOLD PULSEWIDTH: 0.4 ms
MDC IDC MSMT LEADCHNL RV SENSING INTR AMPL: 12 mV
MDC IDC SET LEADCHNL RA PACING AMPLITUDE: 1.5 V
MDC IDC SET LEADCHNL RV PACING PULSEWIDTH: 0.4 ms
MDC IDC SET LEADCHNL RV SENSING SENSITIVITY: 2 mV

## 2014-06-11 MED ORDER — FLECAINIDE ACETATE 100 MG PO TABS: 300.0000 mg | ORAL_TABLET | Freq: Once | ORAL | Status: DC

## 2014-06-11 NOTE — Progress Notes (Signed)
Electrophysiology Office Note   Date:  06/11/2014   ID:  Alicia, [name] Alicia [name], MRN [id number]  PCP:  Gar Ponto, MD    Primary Electrophysiologist: Thompson Grayer, MD    Chief Complaint  Patient presents with  . Atrial Fibrillation     History of Present Illness: Alicia [name] is a 69 y.o. female who presents today for electrophysiology evaluation.   The patient returns for AF follow-up.  She has had a single episode of afib 05/09/14 documented on her pacemaker.  This corresponds to symptoms of weakness, dizziness, and nausea.  The episode lasted 2 hours and 55 minutes.  She finds her AF episodes very disturbing when they occur.  She is fearful that she may have others.  I have reassured her that her AF is not life threatening and will indeed likely recur.  She has not made any effort at lifestyle modification.  I spent a long period of time today discussing the importance of lifestyle modification.  We discussed the Cardiofit trial which reveals remarkable improvement in AF with 10% weight reduction and regular exercise.  As she does not typically tolerate medicine very well, I think that lifestyle modification may be her best step.  I will given flecainide which she can use as a pill in pocket approach.  Her primary concern today is with fatigue and decreased exercise tolerance.  She does not feel resting upon waking.  I will therefore refer to Dr Redmond Pulling for sleep study.  I will also obtain a myoview to exclude ischemia as the cause.  Today, she denies symptoms of chest pain, shortness of breath, orthopnea, PND, lower extremity edema, claudication, dizziness, presyncope, syncope, bleeding, or neurologic sequela. The patient is tolerating medications without difficulties and is otherwise without complaint today.    Past Medical History  Diagnosis Date  . Obesity   . Diabetes   . DJD (degenerative joint disease)   . Hypothyroidism   . Dysrhythmia     SYMPTOMATIC BRADYCARDIA  .  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 Anemia   . Hypertension     off meds at this time  . Wears glasses    Past Surgical History  Procedure Laterality Date  . Cholecystectomy    . 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 Shoulder surgery Right 2012  . Breast surgery      biopsy  . Knee surgery      right  . Ankle surgery Right     multiple  . Foot arthrotomy      left  . Colonoscopy    . Eye surgery      both cataracts  . Shoulder arthroscopy with subacromial decompression and open rotator c Right 07/07/2013    Procedure: RIGHT SHOULDER ARTHROSCOPY WITH SUBACROMIAL DECOMPRESSION AND ROTATOR CUFF DEBRIDEMENT/ DISTAL CLAVICLE RESECTION;  Surgeon: [name], MD;  Location: [place];  Service: Orthopedics;  Laterality: Right;  . Permanent pacemaker insertion N/A 12/15/2012    Procedure: PERMANENT PACEMAKER INSERTION;  Surgeon: [name], MD;  Location: [facility] CATH LAB;  Service: Cardiovascular;  Laterality: N/A;     Current Outpatient Prescriptions  Medication Sig Dispense Refill  . apixaban (ELIQUIS) 5 MG TABS tablet Take 5 mg by mouth 2 (two) times daily.    [name] diltiazem (CARDIZEM) 30 MG tablet Take  1 tablet (30 mg total) by mouth every 6 (six) hours as needed. 30 tablet 1  . flecainide (TAMBOCOR) 100 MG tablet Take 3 tablets (300 mg total) by mouth once. AS NEEDED 12 tablet 0  . insulin NPH-regular Human (NOVOLIN 70/30) (70-30) 100 UNIT/ML injection Inject 48 Units into the skin 2 (two) times daily with a meal.     . levothyroxine (SYNTHROID, LEVOTHROID) 100 MCG tablet Take 100 mcg by mouth daily before breakfast.    . lisinopril-hydrochlorothiazide (PRINZIDE,ZESTORETIC) 10-12.5 MG per tablet Take 1 tablet by mouth daily.    . Multiple Vitamins-Minerals (ICAPS) CAPS Take 1 capsule by mouth daily.     [name] (SYSTANE) 0.4-0.3 %  SOLN Place 1 drop into both eyes as needed (dry eyes).    . Probiotic Product (PROBIOTIC DAILY PO) Take 1 tablet by mouth daily.     No current facility-administered medications for this visit.    Allergies:   Ace inhibitors; Cardizem; Statins; Sulfa antibiotics; Tape; Cortisone; and Latex   Social History:  The patient  reports that she has never smoked. She has never used smokeless tobacco. She reports that she does not drink alcohol or use illicit drugs.   Family History:  The patient's  family history includes Cancer in her father; Stroke in her mother; Sudden death in her brother.    ROS:  Please see the history of present illness.   All other systems are reviewed and negative.    PHYSICAL EXAM: VS:  BP 131/73 mmHg  Pulse 55  Ht 5\' 6"  (1.676 m)  Wt 240 lb 12.8 oz (109.226 kg)  BMI 38.88 kg/m2 , BMI Body mass index is 38.88 kg/(m^2). GEN: Well nourished, well developed, in no acute distress HEENT: normal Neck: no JVD, carotid bruits, or masses Cardiac: RRR; no murmurs, rubs, or gallops,no edema  Respiratory:  clear to auscultation bilaterally, normal work of breathing GI: soft, nontender, nondistended, + BS MS: no deformity or atrophy Skin: warm and dry  Neuro:  Strength and sensation are intact Psych: euthymic mood, full affect Pacemaker pocket is well healed  Pacemaker interrogation is reviewd today (see paceart)   Recent Labs: 07/03/2013: BUN 15; Creatinine 0.85; Potassium 4.2; Sodium 137 07/07/2013: Hemoglobin 13.9    Lipid Panel  No results found for: CHOL, TRIG, HDL, CHOLHDL, VLDL, LDLCALC, LDLDIRECT   Wt Readings from Last 3 Encounters:  06/11/14 240 lb 12.8 oz (109.226 kg)  04/16/14 244 lb (110.678 kg)  10/20/13 241 lb 12.8 oz (109.68 kg)      ASSESSMENT AND PLAN:  1.  Paroxysmal atrial fibrillation Lifestyle modification is essential for [place] to manage her afib. Weight loss and regular exercise are encouraged.  I have recommended the dietician  educational program through the AF clinic.  I have also recommended joining the [facility] and [employer].  I am not convinced that she is ready for lifestyle change.  Sleep study is recommended to evaluate for sleep apnea. Add flecainide as a pill in pocket (300mg  po x 1 when AF occurs) chads2vasc score is 4.  She is tolerating eliquis. I will refer to the AF clinic for close outpatient management with Roderic Palau NP  2. Second degree AV block Normal pacemaker function See Pace Art report No changes today  3. Fatigue/ decreased exercise tolerance lexiscan myoview Sleep study She will follow-up with Dr Olena Heckle regarding thyroid management  4. Obesity Body mass index is 38.88 kg/(m^2). As above  Today, I have spent 40 minutes  with the patient discussing afib management and lifestyle change required.  More than 50% of the visit time today was spent on this issue.    Current medicines are reviewed at length with the patient today.   The patient does not have concerns regarding her medicines.  The following changes were made today:  none  Labs/ tests ordered today include:  Orders Placed This Encounter  Procedures  . NM Myocar Multi W/Spect W/Wall Motion / EF  . Ambulatory referral to ENT  . Ambulatory referral to Cardiology  . Myocardial Perfusion Imaging  . Implantable device check    Follow-up with Roderic Palau NP in 4 weeks [name] I will see again in 3 months  Signed, Thompson Grayer, MD  06/11/2014 1:23 PM     [place] [address] [place] [address] [phone] (office) [phone] (fax)

## 2014-06-11 NOTE — Patient Instructions (Signed)
   Flecainide 100mg  - may take 3 tabs as needed  Continue all other medications.   Your physician has requested that you have a lexiscan myoview. For further information please visit [email or web address]. Please follow instruction sheet, as given. Office will contact with results via phone or letter.   Referral to Dr. Redmond Pulling - sleep apnea Referral to Doristine Devoid, NP - atrial fib clinic at [facility] - they will call you  Follow up in  3 months - Dr. [name]

## 2014-06-16 DIAGNOSIS — L309 Dermatitis, unspecified: Secondary | ICD-10-CM | POA: Diagnosis not present

## 2014-06-16 DIAGNOSIS — L570 Actinic keratosis: Secondary | ICD-10-CM | POA: Diagnosis not present

## 2014-06-18 ENCOUNTER — Encounter (HOSPITAL_COMMUNITY): Payer: Self-pay

## 2014-06-18 ENCOUNTER — Encounter (HOSPITAL_COMMUNITY)
Admission: RE | Admit: 2014-06-18 | Discharge: 2014-06-18 | Disposition: A | Discharge status: Home / Self Care | Payer: Medicare Other | Source: Ambulatory Visit | Attending: Internal Medicine | Admitting: Internal Medicine

## 2014-06-18 ENCOUNTER — Ambulatory Visit (HOSPITAL_COMMUNITY)
Admission: RE | Admit: 2014-06-18 | Discharge: 2014-06-18 | Disposition: A | Discharge status: Home / Self Care | Payer: Medicare Other | Source: Ambulatory Visit | Attending: Internal Medicine | Admitting: Internal Medicine

## 2014-06-18 ENCOUNTER — Encounter: Payer: Self-pay | Admitting: Internal Medicine

## 2014-06-18 DIAGNOSIS — I10 Essential (primary) hypertension: Secondary | ICD-10-CM | POA: Insufficient documentation

## 2014-06-18 DIAGNOSIS — R5383 Other fatigue: Secondary | ICD-10-CM | POA: Diagnosis not present

## 2014-06-18 DIAGNOSIS — R0602 Shortness of breath: Secondary | ICD-10-CM

## 2014-06-18 DIAGNOSIS — I2510 Atherosclerotic heart disease of native coronary artery without angina pectoris: Secondary | ICD-10-CM | POA: Diagnosis not present

## 2014-06-18 DIAGNOSIS — Z950 Presence of cardiac pacemaker: Secondary | ICD-10-CM | POA: Diagnosis not present

## 2014-06-18 DIAGNOSIS — R079 Chest pain, unspecified: Secondary | ICD-10-CM | POA: Diagnosis not present

## 2014-06-18 MED ORDER — SODIUM CHLORIDE 0.9 % IJ SOLN
INTRAMUSCULAR | Status: AC
  Administered 2014-06-18: 10 mL via INTRAVENOUS
  Filled 2014-06-18: qty 3

## 2014-06-18 MED ORDER — REGADENOSON 0.4 MG/5ML IV SOLN
0.4000 mg | Freq: Once | INTRAVENOUS | Status: AC | PRN
  Administered 2014-06-18: 0.4 mg via INTRAVENOUS

## 2014-06-18 MED ORDER — TECHNETIUM TC 99M SESTAMIBI GENERIC - CARDIOLITE
30.0000 | Freq: Once | INTRAVENOUS | Status: AC | PRN
  Administered 2014-06-18: 30 via INTRAVENOUS

## 2014-06-18 MED ORDER — REGADENOSON 0.4 MG/5ML IV SOLN
INTRAVENOUS | Status: AC
Start: 2014-06-18 — End: 2014-06-18
  Administered 2014-06-18: 0.4 mg via INTRAVENOUS
  Filled 2014-06-18: qty 5

## 2014-06-18 MED ORDER — SODIUM CHLORIDE 0.9 % IJ SOLN
10.0000 mL | INTRAMUSCULAR | Status: DC | PRN
  Administered 2014-06-18: 10 mL via INTRAVENOUS
  Filled 2014-06-18: qty 10

## 2014-06-18 MED ORDER — TECHNETIUM TC 99M SESTAMIBI - CARDIOLITE
10.0000 | Freq: Once | INTRAVENOUS | Status: AC | PRN
  Administered 2014-06-18: 09:00:00 10 via INTRAVENOUS

## 2014-06-18 NOTE — Progress Notes (Signed)
Stress Lab Nurses Notes - [employer] 06/18/2014 Reason for doing test: CAD and Chest Pain Type of test: Wille Glaser Nurse performing test: Gerrit Halls, RN Nuclear Medicine Tech: Redmond Baseman Echo Tech: Not Applicable MD performing test: S. McDowell/K.Purcell Nails NP Family MD: Maryla Morrow explained and consent signed: Yes.   IV started: Saline lock flushed, No redness or edema and Saline lock started in radiology Symptoms: flushed & nausea Treatment/Intervention: None Reason test stopped: protocol completed After recovery IV was: Discontinued via X-ray tech and No redness or edema Patient to return to Nuc. Med at :11:30 Patient discharged: Home Patient's Condition upon discharge was: stable Comments: During test peak BP 129/47 & HR 93.  Recovery BP 103/44 & HR 72.  Symptoms resolved in recovery. Geanie Cooley T

## 2014-06-22 ENCOUNTER — Telehealth: Payer: Self-pay | Admitting: *Deleted

## 2014-06-22 NOTE — Telephone Encounter (Signed)
Patient informed. 

## 2014-06-22 NOTE — Telephone Encounter (Signed)
-----   Message from Laurine Blazer, LPN sent at 2/76/3943 11:06 AM EST -----   ----- Message -----    From: Dionicio Stall, RN    Sent: 06/21/2014   4:00 PM      To: Laurine Blazer, LPN

## 2014-07-05 DIAGNOSIS — E039 Hypothyroidism, unspecified: Secondary | ICD-10-CM | POA: Diagnosis not present

## 2014-07-05 DIAGNOSIS — E118 Type 2 diabetes mellitus with unspecified complications: Secondary | ICD-10-CM | POA: Diagnosis not present

## 2014-07-05 DIAGNOSIS — E669 Obesity, unspecified: Secondary | ICD-10-CM | POA: Diagnosis not present

## 2014-07-06 ENCOUNTER — Encounter: Payer: Self-pay | Admitting: Nurse Practitioner

## 2014-07-06 ENCOUNTER — Other Ambulatory Visit: Payer: Self-pay | Admitting: Internal Medicine

## 2014-07-06 ENCOUNTER — Ambulatory Visit (INDEPENDENT_AMBULATORY_CARE_PROVIDER_SITE_OTHER): Payer: Medicare Other | Admitting: Nurse Practitioner

## 2014-07-06 VITALS — BP 128/62 | HR 92 | Ht 66.0 in | Wt 240.1 lb

## 2014-07-06 DIAGNOSIS — I4891 Unspecified atrial fibrillation: Secondary | ICD-10-CM

## 2014-07-06 LAB — MDC_IDC_ENUM_SESS_TYPE_INCLINIC
Battery Voltage: 3.01 V
Brady Statistic RA Percent Paced: 26 %
Date Time Interrogation Session: 20160301143246
Implantable Pulse Generator Model: 2240
Lead Channel Impedance Value: 462.5 Ohm
Lead Channel Impedance Value: 487.5 Ohm
Lead Channel Pacing Threshold Amplitude: 0.5 V
Lead Channel Pacing Threshold Pulse Width: 0.4 ms
Lead Channel Pacing Threshold Pulse Width: 0.4 ms
Lead Channel Sensing Intrinsic Amplitude: 12 mV
Lead Channel Sensing Intrinsic Amplitude: 3.7 mV
Lead Channel Setting Pacing Amplitude: 1.5 V
MDC IDC MSMT BATTERY REMAINING LONGEVITY: 142.8 mo
MDC IDC MSMT LEADCHNL RV PACING THRESHOLD AMPLITUDE: 0.625 V
MDC IDC PG SERIAL: 7528878
MDC IDC SET LEADCHNL RV PACING AMPLITUDE: 0.875
MDC IDC SET LEADCHNL RV PACING PULSEWIDTH: 0.4 ms
MDC IDC SET LEADCHNL RV SENSING SENSITIVITY: 2 mV
MDC IDC STAT BRADY RV PERCENT PACED: 0.84 %

## 2014-07-06 NOTE — Patient Instructions (Addendum)
Remote monitoring is used to monitor your Pacemaker or ICD from home. This monitoring reduces the number of office visits required to check your device to one time per year. It allows [place] to keep an eye on the functioning of your device to ensure it is working properly. You are scheduled for a device check from home on 09/13/2014. You may send your transmission at any time that day. If you have a wireless device, the transmission will be sent automatically. After your physician reviews your transmission, you will receive a postcard with your next transmission date.  Follow up with allred as already scheduled.

## 2014-07-06 NOTE — Progress Notes (Signed)
Electrophysiology Office Note   Date:  07/06/2014   ID:  Alicia [name], Alicia [name] 09-May-1945, MRN [id number]  PCP:  Gar Ponto, MD    Primary Electrophysiologist: Dr. [name]   Chief Complaint  Patient presents with  . Atrial Fibrillation     History of Present Illness: Alicia [name] is a 69 y.o. female who presents today for electrophysiology evaluation.   The patient returns for AF follow-up.  She saw Dr. [name] in [place] in February and pacemaker interrogation showed afib episode in January that lasted around 2 hours. She becomes very symptomatic with episodes with nausea and dizziness. Pacer interrogation today does not show any afib since the last episode. She has flecainide as a  in the pocket to use but has not had to use it. Dr. Lamount Cohen discussed lifestyle efforts on last visit, but pt states unable to exercise due to h/o fx ankle in 4 places. She is pending a sleep study for her c/o fatigue and recently had a low risk stress test. Saw her medical doctor yesterday without any definite issues to explain fatigue.   Today, she denies symptoms of chest pain, shortness of breath, orthopnea, PND, lower extremity edema, claudication, dizziness, presyncope, syncope, bleeding, or neurologic sequela. She does have continued c/o fatigue. The patient is tolerating medications without difficulties and is otherwise without complaint today.    Past Medical History  Diagnosis Date  . Obesity   . Diabetes   . DJD (degenerative joint disease)   . Hypothyroidism   . Dysrhythmia     SYMPTOMATIC BRADYCARDIA  . 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 Anemia   . Hypertension     off meds at this time  . Wears glasses    Past Surgical History  Procedure Laterality Date  . Cholecystectomy    . 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 Shoulder surgery Right 2012  . Breast surgery      biopsy  . Knee  surgery      right  . Ankle surgery Right     multiple  . Foot arthrotomy      left  . Colonoscopy    . Eye surgery      both cataracts  . Shoulder arthroscopy with subacromial decompression and open rotator c Right 07/07/2013    Procedure: RIGHT SHOULDER ARTHROSCOPY WITH SUBACROMIAL DECOMPRESSION AND ROTATOR CUFF DEBRIDEMENT/ DISTAL CLAVICLE RESECTION;  Surgeon: [name], MD;  Location: [place];  Service: Orthopedics;  Laterality: Right;  . Permanent pacemaker insertion N/A 12/15/2012    Procedure: PERMANENT PACEMAKER INSERTION;  Surgeon: [name], MD;  Location: [facility] CATH LAB;  Service: Cardiovascular;  Laterality: N/A;     Current Outpatient Prescriptions  Medication Sig Dispense Refill  . apixaban (ELIQUIS) 5 MG TABS tablet Take 5 mg by mouth 2 (two) times daily.    [name] diltiazem (CARDIZEM) 30 MG tablet Take 1 tablet (30 mg total) by mouth every 6 (six) hours as needed. 30 tablet 1  . flecainide (TAMBOCOR) 100 MG tablet Take 3 tablets (300 mg total) by mouth once. AS NEEDED 12 tablet 0  . insulin NPH-regular Human (NOVOLIN 70/30) (70-30) 100 UNIT/ML injection Inject 48 Units into the skin 2 (two) times daily with a meal.     . levothyroxine (SYNTHROID, LEVOTHROID) 100 MCG tablet Take 100  mcg by mouth daily before breakfast.    . lisinopril-hydrochlorothiazide (PRINZIDE,ZESTORETIC) 10-12.5 MG per tablet Take 1 tablet by mouth daily.    . Multiple Vitamins-Minerals (ICAPS) CAPS Take 1 capsule by mouth daily.     [name] (SYSTANE) 0.4-0.3 % SOLN Place 1 drop into both eyes as needed (dry eyes).    . Probiotic Product (PROBIOTIC DAILY PO) Take 1 tablet by mouth daily.     No current facility-administered medications for this visit.    Allergies:   Ace inhibitors; Cardizem; Statins; Sulfa antibiotics; Tape; Cortisone; and Latex   Social History:  The patient  reports that she has never smoked. She has never used smokeless tobacco.  She reports that she does not drink alcohol or use illicit drugs.   Family History:  The patient's  family history includes Cancer in her father; Stroke in her mother; Sudden death in her brother.    ROS:  Please see the history of present illness.   All other systems are reviewed and negative.    PHYSICAL EXAM: VS:  BP 128/62 mmHg  Pulse 92  Ht 5\' 6"  (1.676 m)  Wt 240 lb 1.9 oz (108.918 kg)  BMI 38.77 kg/m2 , BMI Body mass index is 38.77 kg/(m^2). GEN: Well nourished, well developed, in no acute distress HEENT: normal Neck: no JVD, carotid bruits, or masses Cardiac: RRR; no murmurs, rubs, or gallops,no edema  Respiratory:  clear to auscultation bilaterally, normal work of breathing GI: soft, nontender, nondistended, + BS MS: no deformity or atrophy Skin: warm and dry  Neuro:  Strength and sensation are intact Psych: euthymic mood, full affect Pacemaker pocket is well healed  Pacemaker interrogation is reviewd today (see paceart)     Wt Readings from Last 3 Encounters:  07/06/14 240 lb 1.9 oz (108.918 kg)  06/11/14 240 lb 12.8 oz (109.226 kg)  04/16/14 244 lb (110.678 kg)      ASSESSMENT AND PLAN:  1. Symptomatic Paroxysmal atrial fibrillation I believe regular exercise would help the pt but she states she cannot walk due to fx of ankle and is afraid of the water so not interested in water aerobics. Recumbent bike is a possibility but she doesn't want to join a gym right now due to insurance not having silver sneaker benefits. Dietary class at [facility] recommended to pt. Sleep study is recommended to evaluate for sleep apnea and is pending next week. Use flecainide as a pill in pocket (300mg  po x 1 when AF occurs) chads2vasc score is 4.  She is tolerating eliquis.  2. Second degree AV block Normal pacemaker function See Pace Art report No changes today  3. Fatigue/ decreased exercise tolerance lexiscan myoview normal Sleep study pending She will follow-up with Dr  Olena Heckle regarding thyroid management  4. Obesity Body mass index is 38.77 kg/(m^2). As above    Current medicines are reviewed at length with the patient today.   The patient does not have concerns regarding her medicines.  The following changes were made today:  none  Labs/ tests ordered today include:  Orders Placed This Encounter  Procedures  . EKG 12-Lead    Follow-up with Dr. [name] as scheduled in 3 months.  Eduard Roux, NP  07/06/2014 2:53 PM     [place] [address] [phone] (office) [phone] (fax)

## 2014-07-20 DIAGNOSIS — G4733 Obstructive sleep apnea (adult) (pediatric): Secondary | ICD-10-CM | POA: Diagnosis not present

## 2014-07-20 DIAGNOSIS — I4891 Unspecified atrial fibrillation: Secondary | ICD-10-CM | POA: Diagnosis not present

## 2014-07-26 ENCOUNTER — Encounter: Payer: Self-pay | Admitting: Internal Medicine

## 2014-07-26 DIAGNOSIS — G4733 Obstructive sleep apnea (adult) (pediatric): Secondary | ICD-10-CM | POA: Diagnosis not present

## 2014-07-28 DIAGNOSIS — E782 Mixed hyperlipidemia: Secondary | ICD-10-CM | POA: Diagnosis not present

## 2014-07-28 DIAGNOSIS — E6609 Other obesity due to excess calories: Secondary | ICD-10-CM | POA: Diagnosis not present

## 2014-07-28 DIAGNOSIS — E039 Hypothyroidism, unspecified: Secondary | ICD-10-CM | POA: Diagnosis not present

## 2014-07-28 DIAGNOSIS — I10 Essential (primary) hypertension: Secondary | ICD-10-CM | POA: Diagnosis not present

## 2014-07-28 DIAGNOSIS — K210 Gastro-esophageal reflux disease with esophagitis: Secondary | ICD-10-CM | POA: Diagnosis not present

## 2014-07-28 DIAGNOSIS — M1990 Unspecified osteoarthritis, unspecified site: Secondary | ICD-10-CM | POA: Diagnosis not present

## 2014-07-28 DIAGNOSIS — E1165 Type 2 diabetes mellitus with hyperglycemia: Secondary | ICD-10-CM | POA: Diagnosis not present

## 2014-07-30 DIAGNOSIS — G4733 Obstructive sleep apnea (adult) (pediatric): Secondary | ICD-10-CM | POA: Diagnosis not present

## 2014-08-03 DIAGNOSIS — K219 Gastro-esophageal reflux disease without esophagitis: Secondary | ICD-10-CM | POA: Diagnosis not present

## 2014-08-03 DIAGNOSIS — I10 Essential (primary) hypertension: Secondary | ICD-10-CM | POA: Diagnosis not present

## 2014-08-03 DIAGNOSIS — E782 Mixed hyperlipidemia: Secondary | ICD-10-CM | POA: Diagnosis not present

## 2014-08-03 DIAGNOSIS — E8881 Metabolic syndrome: Secondary | ICD-10-CM | POA: Diagnosis not present

## 2014-08-03 DIAGNOSIS — G4733 Obstructive sleep apnea (adult) (pediatric): Secondary | ICD-10-CM | POA: Diagnosis not present

## 2014-08-03 DIAGNOSIS — E1165 Type 2 diabetes mellitus with hyperglycemia: Secondary | ICD-10-CM | POA: Diagnosis not present

## 2014-08-03 DIAGNOSIS — E039 Hypothyroidism, unspecified: Secondary | ICD-10-CM | POA: Diagnosis not present

## 2014-08-03 DIAGNOSIS — E6609 Other obesity due to excess calories: Secondary | ICD-10-CM | POA: Diagnosis not present

## 2014-09-13 ENCOUNTER — Ambulatory Visit: Payer: Medicare Other | Admitting: *Deleted

## 2014-09-13 ENCOUNTER — Telehealth: Payer: Self-pay | Admitting: Cardiology

## 2014-09-13 DIAGNOSIS — I441 Atrioventricular block, second degree: Secondary | ICD-10-CM | POA: Diagnosis not present

## 2014-09-13 NOTE — Progress Notes (Signed)
Remote pacemaker transmission.   

## 2014-09-13 NOTE — Telephone Encounter (Signed)
Spoke with pt and reminded pt of remote transmission that is due today. Pt verbalized understanding.   

## 2014-09-17 ENCOUNTER — Ambulatory Visit (INDEPENDENT_AMBULATORY_CARE_PROVIDER_SITE_OTHER): Payer: Medicare Other | Admitting: Internal Medicine

## 2014-09-17 ENCOUNTER — Encounter: Payer: Self-pay | Admitting: Internal Medicine

## 2014-09-17 VITALS — BP 118/78 | HR 64 | Ht 66.0 in | Wt 239.0 lb

## 2014-09-17 DIAGNOSIS — I10 Essential (primary) hypertension: Secondary | ICD-10-CM

## 2014-09-17 DIAGNOSIS — I441 Atrioventricular block, second degree: Secondary | ICD-10-CM | POA: Diagnosis not present

## 2014-09-17 DIAGNOSIS — I480 Paroxysmal atrial fibrillation: Secondary | ICD-10-CM | POA: Diagnosis not present

## 2014-09-17 LAB — CUP PACEART INCLINIC DEVICE CHECK
Battery Voltage: 3.01 V
Brady Statistic RA Percent Paced: 29 %
Date Time Interrogation Session: 20160513120427
Lead Channel Impedance Value: 462.5 Ohm
Lead Channel Pacing Threshold Amplitude: 0.5 V
Lead Channel Pacing Threshold Pulse Width: 0.4 ms
Lead Channel Sensing Intrinsic Amplitude: 12 mV
Lead Channel Sensing Intrinsic Amplitude: 3.6 mV
Lead Channel Setting Pacing Amplitude: 0.75 V
Lead Channel Setting Pacing Amplitude: 1.5 V
Lead Channel Setting Pacing Pulse Width: 0.4 ms
MDC IDC MSMT BATTERY REMAINING LONGEVITY: 142.8 mo
MDC IDC MSMT LEADCHNL RV IMPEDANCE VALUE: 487.5 Ohm
MDC IDC MSMT LEADCHNL RV PACING THRESHOLD AMPLITUDE: 0.5 V
MDC IDC MSMT LEADCHNL RV PACING THRESHOLD PULSEWIDTH: 0.4 ms
MDC IDC SET LEADCHNL RV SENSING SENSITIVITY: 2 mV
MDC IDC STAT BRADY RV PERCENT PACED: 0.94 %
Pulse Gen Model: 2240
Pulse Gen Serial Number: 7528878

## 2014-09-17 NOTE — Progress Notes (Signed)
Electrophysiology Office Note   Date:  09/17/2014   ID:  Alicia [name], Alicia [name] May 31, 1945, MRN [id number]  PCP:  Gar Ponto, MD    Primary Electrophysiologist: Thompson Grayer, MD    Chief Complaint  Patient presents with  . Atrial Fibrillation     History of Present Illness: Alicia [name] is a 69 y.o. female who presents today for electrophysiology follow-up.  She has had no further afib since I saw her last.  She has not made any effort at lifestyle modification.  I spent a long period of time today discussing the importance of lifestyle modification.  We discussed the Cardiofit trial which reveals remarkable improvement in AF with 10% weight reduction and regular exercise.  As she does not typically tolerate medicine very well, I think that lifestyle modification may be her best step.  She has been diagnosed with OSA but does not feel that she can use CPAP.  Today, she denies symptoms of chest pain, shortness of breath, orthopnea, PND, lower extremity edema, claudication, dizziness, presyncope, syncope, bleeding, or neurologic sequela. The patient is tolerating medications without difficulties and is otherwise without complaint today.    Past Medical History  Diagnosis Date  . Obesity   . Diabetes   . DJD (degenerative joint disease)   . Hypothyroidism   . Dysrhythmia     SYMPTOMATIC BRADYCARDIA  . 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 Anemia   . Hypertension     off meds at this time  . Wears glasses    Past Surgical History  Procedure Laterality Date  . Cholecystectomy    . 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 Shoulder surgery Right 2012  . Breast surgery      biopsy  . Knee surgery      right  . Ankle surgery Right     multiple  . Foot arthrotomy      left  . Colonoscopy    . Eye surgery      both cataracts  . Shoulder arthroscopy with subacromial  decompression and open rotator c Right 07/07/2013    Procedure: RIGHT SHOULDER ARTHROSCOPY WITH SUBACROMIAL DECOMPRESSION AND ROTATOR CUFF DEBRIDEMENT/ DISTAL CLAVICLE RESECTION;  Surgeon: [name], MD;  Location: [facility];  Service: Orthopedics;  Laterality: Right;  . Permanent pacemaker insertion N/A 12/15/2012    Procedure: PERMANENT PACEMAKER INSERTION;  Surgeon: [name], MD;  Location: [facility] CATH LAB;  Service: Cardiovascular;  Laterality: N/A;     Current Outpatient Prescriptions  Medication Sig Dispense Refill  . apixaban (ELIQUIS) 5 MG TABS tablet Take 5 mg by mouth 2 (two) times daily.    [name] diltiazem (CARDIZEM) 30 MG tablet Take 1 tablet (30 mg total) by mouth every 6 (six) hours as needed. 30 tablet 1  . flecainide (TAMBOCOR) 100 MG tablet Take 3 tablets (300 mg total) by mouth once. AS NEEDED 12 tablet 0  . insulin NPH-regular Human (NOVOLIN 70/30) (70-30) 100 UNIT/ML injection Inject 48 Units into the skin 2 (two) times daily with a meal.     . levothyroxine (SYNTHROID, LEVOTHROID) 100 MCG tablet Take 100 mcg by mouth daily before breakfast.    . lisinopril-hydrochlorothiazide (PRINZIDE,ZESTORETIC) 10-12.5 MG per tablet Take 1 tablet by mouth daily.    . Multiple Vitamins-Minerals (ICAPS) CAPS Take 1 capsule by mouth  daily.     . Polyethyl Glycol-Propyl Glycol (SYSTANE) 0.4-0.3 % SOLN Place 1 drop into both eyes as needed (dry eyes).    . Probiotic Product (PROBIOTIC DAILY PO) Take 1 tablet by mouth daily.     No current facility-administered medications for this visit.    Allergies:   Ace inhibitors; Cardizem; Statins; Sulfa antibiotics; Tape; Cortisone; and Latex   Social History:  The patient  reports that she has never smoked. She has never used smokeless tobacco. She reports that she does not drink alcohol or use illicit drugs.   Family History:  The patient's  family history includes Cancer in her father; Stroke in her mother; Sudden death in  her brother.    ROS:  Please see the history of present illness.   All other systems are reviewed and negative.    PHYSICAL EXAM: VS:  BP 118/78 mmHg  Pulse 64  Ht 5\' 6"  (1.676 m)  Wt 239 lb (108.41 kg)  BMI 38.59 kg/m2  SpO2 98% , BMI Body mass index is 38.59 kg/(m^2). GEN: Well nourished, well developed, in no acute distress HEENT: normal Neck: no JVD, carotid bruits, or masses Cardiac: RRR; no murmurs, rubs, or gallops,no edema  Respiratory:  clear to auscultation bilaterally, normal work of breathing GI: soft, nontender, nondistended, + BS MS: no deformity or atrophy Skin: warm and dry  Neuro:  Strength and sensation are intact Psych: euthymic mood, full affect Pacemaker pocket is well healed  Pacemaker interrogation is reviewd today (see paceart)   Recent Labs: 04/01/2014: BUN 19*; Creatinine 1.24; Hemoglobin 13.4; Platelets 210; Potassium 4.4; Sodium 140    Lipid Panel  No results found for: CHOL, TRIG, HDL, CHOLHDL, VLDL, LDLCALC, LDLDIRECT   Wt Readings from Last 3 Encounters:  09/17/14 239 lb (108.41 kg)  07/06/14 240 lb 1.9 oz (108.918 kg)  06/11/14 240 lb 12.8 oz (109.226 kg)      ASSESSMENT AND PLAN:  1.  Paroxysmal atrial fibrillation Lifestyle modification is essential for [place] to manage her afib.  I am concerned with her inability to use CPAP, exercise, or lose weight.  There is an abundance of data which suggests that without these changes her Afib will continue to recur and progress. Weight loss and regular exercise are encouraged.  I have recommended the dietician educational program through the AF clinic.  I have also recommended joining the [facility] [employer].  I am not convinced that she is ready for lifestyle change.  She has flecainide which she can use as a pill in pocket (300mg  po x 1 when AF occurs) chads2vasc score is 4.  She is tolerating eliquis.  2. Second degree AV block Normal pacemaker function See Pace Art report No changes  today  3. Obesity Body mass index is 38.59 kg/(m^2). As above  Today, I have spent 40 minutes with the patient discussing afib management and lifestyle change required.  More than 50% of the visit time today was spent on this issue.   Current medicines are reviewed at length with the patient today.   The patient does not have concerns regarding her medicines.  The following changes were made today:  none  Labs/ tests ordered today include:  Orders Placed This Encounter  Procedures  . Implantable device check    [name] I will see again in 12 months unless afib recurs  Signed, Thompson Grayer, MD  09/17/2014 11:27 PM     [address] [place]  [place] [address] [phone] (office) [phone] (fax)

## 2014-09-17 NOTE — Addendum Note (Signed)
Addended by: Tiajuana Amass on: 09/17/2014 12:00 PM   Modules accepted: Level of Service

## 2014-09-17 NOTE — Patient Instructions (Signed)
Your physician recommends that you continue on your current medications as directed. Please refer to the Current Medication list given to you today. [name] Device check on 12/20/14. Your physician recommends that you schedule a follow-up appointment in: 1 year with Dr. [name]. You will receive a reminder letter in the mail in about 10 months reminding you to call and schedule your appointment. If you don't receive this letter, please contact our office.

## 2014-10-05 DIAGNOSIS — E039 Hypothyroidism, unspecified: Secondary | ICD-10-CM | POA: Insufficient documentation

## 2014-10-05 DIAGNOSIS — E119 Type 2 diabetes mellitus without complications: Secondary | ICD-10-CM | POA: Diagnosis not present

## 2014-10-11 NOTE — Patient Outreach (Signed)
[facility]) Care Management  10/11/2014  MERI PELOT [birth year] [id number]   Referral received from MD and assigned to Maury Dus, RN for patient outreach.  Ronnell Freshwater. [place], [place] Management [place] Assistant Phone: [phone] Fax: [phone]

## 2014-10-19 ENCOUNTER — Encounter: Payer: Self-pay | Admitting: Internal Medicine

## 2014-10-22 ENCOUNTER — Other Ambulatory Visit: Payer: Self-pay

## 2014-10-22 DIAGNOSIS — I482 Chronic atrial fibrillation, unspecified: Secondary | ICD-10-CM

## 2014-10-22 DIAGNOSIS — E1165 Type 2 diabetes mellitus with hyperglycemia: Secondary | ICD-10-CM

## 2014-10-22 DIAGNOSIS — I10 Essential (primary) hypertension: Secondary | ICD-10-CM

## 2014-10-22 DIAGNOSIS — E669 Obesity, unspecified: Secondary | ICD-10-CM

## 2014-10-22 NOTE — Patient Outreach (Signed)
[facility]) Care Management  10/22/2014  Alicia [name] 07/01/1945 [id number]   RN CM spoke with patient and explained the services of [facility].  Patient is agreeable to services and is agreeable to scheduling an appointment.  Patient has chronic conditions of diabetes, hypertension, atrial fibrillation, and obesity.  RN CM asked patient how was she managing her diabetes.  Patient reports she is on Humulin 70/30 subcutaneous 48 units twice a day.  Reports her blood sugar today was 115.  States her last A1c was 6.7 at the endocrinologist's office of Dr. Awilda Metro.  Patient states she has been a diabetic since 1980 but has only recently had to control her diabetes by insulin.  Patient has a permanent pacemaker.  Patient states her major concern is her atrial fibrillation.  States she is symptomatic when in atrial fibrillation.  Patient states she could benefit from education on nutrition related to her diabetes and heart problems.  States she feels her blood pressure is being better controlled and has a machine to check her blood pressure regularly.  Patient admits that she does not exercise because of her osteoarthritis in both knees.  RN CM will make a referral to Health Coach for disease management of her chronic conditions.    Maury Dus, RN, [place]el Holter, [place] Telephonic Care Coordinator [phone]

## 2014-10-25 ENCOUNTER — Other Ambulatory Visit: Payer: Self-pay

## 2014-10-25 NOTE — Patient Outreach (Signed)
[facility]) Care Management  10/25/2014  DESREE LEAP 1946/02/26 [id number]   Notification from Maury Dus, RN to assigned [place], assigned Jon Billings, RN.  Ronnell Freshwater. [place], [place] Management [place] Assistant Phone: [phone] Fax: [phone]

## 2014-10-25 NOTE — Patient Outreach (Signed)
[facility]) Care Management  [place]  10/25/2014   LAVORIS CANIZALES Sep 18, 1945 [id number]   Telephone call to patient for initial disease management assessment.  Patient  receptive to call.  Patient states she is doing ok.  Reports that her A1c is slowly going down.  She reports originally before seeing Dr. Awilda Metro her A1c was 10.  She reports no discussed goal of her A1c with Dr. Awilda Metro.  Discussed with patient utilizing portion control to help with her A1c's.  She reports last A1c was 6.7.  Patient  checks her sugar twice a day and reports sugar today was 115.  Patient to see podiatrist as she has an area to her left foot that causes her some pain.  Discussed importance of keeping daily check to her feet.  She states she cannot see them but has her husband look at the bottom of her feet.  Patient denies any problems with feeling in her feet.   Patient reports no real exercise due to osteoarthritis and pain. Talked with patient about possible stationary bike but she reports that hurts her knees as well and that her heart doctor really wants her to exercise due to her history of Atrial Fibrillation.  Patient reports being terrified of the water so water aerobics she cannot do.  Patient states she does what she can and rests when pain in her knees bother her. No problems with atrial fibrillation since January and cutting caffeine.  Patient has not had an eye exam in the last year but reports appointment in July.   Current Medications:  Current Outpatient Prescriptions  Medication Sig Dispense Refill  . apixaban (ELIQUIS) 5 MG TABS tablet Take 5 mg by mouth 2 (two) times daily.    [name] diltiazem (CARDIZEM) 30 MG tablet Take 1 tablet (30 mg total) by mouth every 6 (six) hours as needed. 30 tablet 1  . flecainide (TAMBOCOR) 100 MG tablet Take 3 tablets (300 mg total) by mouth once. AS NEEDED 12 tablet 0  . insulin NPH-regular Human (NOVOLIN 70/30) (70-30) 100 UNIT/ML injection  Inject 48 Units into the skin 2 (two) times daily with a meal.     . levothyroxine (SYNTHROID, LEVOTHROID) 100 MCG tablet Take 100 mcg by mouth daily before breakfast.    . lisinopril-hydrochlorothiazide (PRINZIDE,ZESTORETIC) 10-12.5 MG per tablet Take 1 tablet by mouth daily.    . Multiple Vitamins-Minerals (ICAPS) CAPS Take 1 capsule by mouth daily.     [name] (SYSTANE) 0.4-0.3 % SOLN Place 1 drop into both eyes as needed (dry eyes).    . Probiotic Product (PROBIOTIC DAILY PO) Take 1 tablet by mouth daily.     No current facility-administered medications for this visit.    Functional Status:  In your present state of health, do you have any difficulty performing the following activities: 10/25/2014  Hearing? N  Vision? N  Difficulty concentrating or making decisions? N  Walking or climbing stairs? N  Dressing or bathing? N  Doing errands, shopping? N  Preparing Food and eating ? N  Using the Toilet? N  In the past six months, have you accidently leaked urine? N  Do you have problems with loss of bowel control? N  Managing your Medications? N  Managing your Finances? N  Housekeeping or managing your Housekeeping? N    Fall/Depression Screening: PHQ 2/9 Scores 10/25/2014  PHQ - 2 Score 0    THN CM Care Plan Problem One  Patient Outreach Telephone from 10/25/2014 in [place] Problem One  Knowledge deficit related to self management of diabetes   Care Plan for Problem One  Active   THN Long Term Goal (31-90 days)  Patient will be able to verbalize using the plate method in portion controlling food at meals within 90 days   THN Long Term Goal Start Date  10/25/14   Interventions for Problem One Long Term Goal  Discussed with patient utilizing plate method to control portions.    THN CM Short Term Goal #1 (0-30 days)  Patient will verbalize limiting foods with high carbohydrate counts within 30 days.   THN CM Short Term Goal  #1 Start Date  10/25/14   Interventions for Short Term Goal #1  Discussed with patient foods that contain high carbohydrates and importance of limiting carbohydrates and sweets.   THN CM Short Term Goal #2 (0-30 days)  Patient will verbalize at least doing some form of activity within the next 30 days.   THN CM Short Term Goal #2 Start Date  10/25/14   Interventions for Short Term Goal #2  Discussed how increased activity helps with blood sugar control. Discussed how gardening as an activity will help blood sugar.         Assessment: Patient agreeable to  [place] to help in her self management of her diabetes mellitus.    Plan: RN Health Coach will provide ongoing education for patient on diabetes through phone calls and sending printed information to patient for further discussion. RN Health Coach will send welcome packet with consent to patient as well as printed information on diabetes. RN Health Coach will send initial barriers letter, assessment, and care plan to primary care physician. RN Health Coach will contact patient within one month and patient agrees to next contact.   Jone Baseman, RN, MSN [place] [phone]

## 2014-10-26 DIAGNOSIS — E1151 Type 2 diabetes mellitus with diabetic peripheral angiopathy without gangrene: Secondary | ICD-10-CM | POA: Diagnosis not present

## 2014-10-26 DIAGNOSIS — M79671 Pain in right foot: Secondary | ICD-10-CM | POA: Diagnosis not present

## 2014-10-26 DIAGNOSIS — L600 Ingrowing nail: Secondary | ICD-10-CM | POA: Diagnosis not present

## 2014-10-26 DIAGNOSIS — E1140 Type 2 diabetes mellitus with diabetic neuropathy, unspecified: Secondary | ICD-10-CM | POA: Diagnosis not present

## 2014-11-01 ENCOUNTER — Other Ambulatory Visit: Payer: Self-pay

## 2014-11-22 ENCOUNTER — Other Ambulatory Visit: Payer: Self-pay

## 2014-11-22 NOTE — Patient Outreach (Signed)
[facility]) Care Management  [place]  11/22/2014   Alicia [name] Oct 31, 1945 [id number]  Telephone call to patient for monthly call.  Patient reports she did good up until Friday.  She reports having a bad day where her blood pressure dropped to 81/40 and feeling cold.  But reports that the day before she had an upset stomach with some diarrhea. However, patient reports doing ok since then.  Discussed with patient if it happens again to notify primary physician or endocrinologist as she has some thyroid issues.  She verbalized understanding.  Patient reports that her blood sugar has been running 120-135 but has had some periods of blood sugar going up into the 150-160 range.  Discussed with patient utilizing portion control and watching sweets and carbohydrates.  She verbalized understanding.  Also reemphasized exercise as a way to control blood sugar.  She verbalized understanding.    Consent: Patient did receive her packet but states no consents with packet.  Will send consent out to patient.      Current Medications:  Current Outpatient Prescriptions  Medication Sig Dispense Refill  . apixaban (ELIQUIS) 5 MG TABS tablet Take 5 mg by mouth 2 (two) times daily.    [name] diltiazem (CARDIZEM) 30 MG tablet Take 1 tablet (30 mg total) by mouth every 6 (six) hours as needed. 30 tablet 1  . flecainide (TAMBOCOR) 100 MG tablet Take 3 tablets (300 mg total) by mouth once. AS NEEDED 12 tablet 0  . insulin NPH-regular Human (NOVOLIN 70/30) (70-30) 100 UNIT/ML injection Inject 48 Units into the skin 2 (two) times daily with a meal.     . levothyroxine (SYNTHROID, LEVOTHROID) 100 MCG tablet Take 100 mcg by mouth daily before breakfast.    . lisinopril-hydrochlorothiazide (PRINZIDE,ZESTORETIC) 10-12.5 MG per tablet Take 1 tablet by mouth daily.    . Multiple Vitamins-Minerals (ICAPS) CAPS Take 1 capsule by mouth daily.     [name] (SYSTANE) 0.4-0.3 % SOLN  Place 1 drop into both eyes as needed (dry eyes).    . Probiotic Product (PROBIOTIC DAILY PO) Take 1 tablet by mouth daily.     No current facility-administered medications for this visit.    Functional Status:  In your present state of health, do you have any difficulty performing the following activities: 10/25/2014  Hearing? N  Vision? N  Difficulty concentrating or making decisions? N  Walking or climbing stairs? N  Dressing or bathing? N  Doing errands, shopping? N  Preparing Food and eating ? N  Using the Toilet? N  In the past six months, have you accidently leaked urine? N  Do you have problems with loss of bowel control? N  Managing your Medications? N  Managing your Finances? N  Housekeeping or managing your Housekeeping? N    Fall/Depression Screening: PHQ 2/9 Scores 11/22/2014 10/25/2014  PHQ - 2 Score 0 0   THN CM Care Plan Problem One        Patient Outreach Telephone from 11/22/2014 in [place] Problem One  Knowledge deficit related to self management of diabetes   Care Plan for Problem One  Active   THN Long Term Goal (31-90 days)  Patient will be able to verbalize using the plate method in portion controlling food at meals within 90 days   THN Long Term Goal Start Date  10/25/14   Interventions for Problem One Long Term Goal  Discussed with patient utilizing plate  method to control portions.   Discussed importance of portion control in controlling blood sugars.    THN CM Short Term Goal #1 (0-30 days)  Patient will verbalize limiting foods with high carbohydrate counts within 30 days.   THN CM Short Term Goal #1 Start Date  10/25/14   [facility] CM Short Term Goal #1 Met Date  11/22/14   Interventions for Short Term Goal #1  Discussed with patient foods that contain high carbohydrates and importance of limiting carbohydrates and sweets.   THN CM Short Term Goal #2 (0-30 days)  Patient will verbalize at least doing some form of activity within  the next 30 days.   THN CM Short Term Goal #2 Start Date  11/22/14 [goal restarted]   Interventions for Short Term Goal #2  Discussed how increased activity helps with blood sugar control. Discussed how gardening as an activity will help blood sugar.  Discussed possibility of taking short walks.      Assessment: Patient will continue to benefit from health coach monthly outreach for disease management.  Plan:  RN Health Coach will contact patient within the next month and patient agrees to next outreach.   Jone Baseman, RN, MSN [place] [phone]

## 2014-11-24 DIAGNOSIS — H3531 Nonexudative age-related macular degeneration: Secondary | ICD-10-CM | POA: Diagnosis not present

## 2014-11-24 DIAGNOSIS — E11329 Type 2 diabetes mellitus with mild nonproliferative diabetic retinopathy without macular edema: Secondary | ICD-10-CM | POA: Diagnosis not present

## 2014-11-24 DIAGNOSIS — Z961 Presence of intraocular lens: Secondary | ICD-10-CM | POA: Diagnosis not present

## 2014-12-20 ENCOUNTER — Encounter: Payer: Self-pay | Admitting: Internal Medicine

## 2014-12-20 ENCOUNTER — Other Ambulatory Visit: Payer: Self-pay

## 2014-12-20 ENCOUNTER — Ambulatory Visit (INDEPENDENT_AMBULATORY_CARE_PROVIDER_SITE_OTHER): Payer: Medicare Other | Admitting: *Deleted

## 2014-12-20 DIAGNOSIS — I441 Atrioventricular block, second degree: Secondary | ICD-10-CM | POA: Diagnosis not present

## 2014-12-20 NOTE — Progress Notes (Signed)
Remote pacemaker transmission.   

## 2014-12-20 NOTE — Patient Outreach (Signed)
[facility]) Care Management  [place]  12/20/2014   SONNY POTH 1945/05/14 [id number]  Telephone call to patient for monthly call.  Patient reports she is doing good.  Reports that she and her husband have been out of town.  She reports a couple of instances where her blood sugar was low. Patient able to describe actions taken for low blood sugars.  Patient reports she is to see PCP and Endocrinologist this month and that Endocrinologist will review her blood sugars. Patient reports she did get her [facility] packet. Discussed packet information.  Patient reports she will be sending consent back this week. Patient also reports she is to get a report for her heart monitor and will notify health coach of any changes.  Patient voices no concerns.    Current Medications:  Current Outpatient Prescriptions  Medication Sig Dispense Refill  . apixaban (ELIQUIS) 5 MG TABS tablet Take 5 mg by mouth 2 (two) times daily.    [name] diltiazem (CARDIZEM) 30 MG tablet Take 1 tablet (30 mg total) by mouth every 6 (six) hours as needed. 30 tablet 1  . flecainide (TAMBOCOR) 100 MG tablet Take 3 tablets (300 mg total) by mouth once. AS NEEDED 12 tablet 0  . insulin NPH-regular Human (NOVOLIN 70/30) (70-30) 100 UNIT/ML injection Inject 48 Units into the skin 2 (two) times daily with a meal.     . levothyroxine (SYNTHROID, LEVOTHROID) 100 MCG tablet Take 100 mcg by mouth daily before breakfast.    . lisinopril-hydrochlorothiazide (PRINZIDE,ZESTORETIC) 10-12.5 MG per tablet Take 1 tablet by mouth daily.    . Multiple Vitamins-Minerals (ICAPS) CAPS Take 1 capsule by mouth daily.     [name] (SYSTANE) 0.4-0.3 % SOLN Place 1 drop into both eyes as needed (dry eyes).    . Probiotic Product (PROBIOTIC DAILY PO) Take 1 tablet by mouth daily.     No current facility-administered medications for this visit.    Functional Status:  In your present state of health, do you have any  difficulty performing the following activities: 12/20/2014 10/25/2014  Hearing? N N  Vision? N N  Difficulty concentrating or making decisions? N N  Walking or climbing stairs? N N  Dressing or bathing? N N  Doing errands, shopping? N N  Preparing Food and eating ? N N  Using the Toilet? N N  In the past six months, have you accidently leaked urine? N N  Do you have problems with loss of bowel control? N N  Managing your Medications? N N  Managing your Finances? N N  Housekeeping or managing your Housekeeping? N N    Fall/Depression Screening: PHQ 2/9 Scores 12/20/2014 11/22/2014 10/25/2014  PHQ - 2 Score 0 0 0   THN CM Care Plan Problem One        Patient Outreach Telephone from 12/20/2014 in [place] Problem One  Knowledge deficit related to self management of diabetes   Care Plan for Problem One  Active   THN Long Term Goal (31-90 days)  Patient will be able to verbalize using the plate method in portion controlling food at meals within 90 days   THN Long Term Goal Start Date  10/25/14   Interventions for Problem One Long Term Goal  Reviewed with patient plate method information sent to patient. Reviewed importance of portion control in controlling blood sugars.    THN CM Short Term Goal #1 (0-30 days)  Patient  will verbalize limiting foods with high carbohydrate counts within 30 days.   THN CM Short Term Goal #1 Start Date  10/25/14   [facility] CM Short Term Goal #1 Met Date  11/22/14   Interventions for Short Term Goal #1  Discussed with patient foods that contain high carbohydrates and importance of limiting carbohydrates and sweets.   THN CM Short Term Goal #2 (0-30 days)  Patient will verbalize at least doing some form of activity within the next 30 days.   THN CM Short Term Goal #2 Start Date  12/20/14 [goal continued]   Interventions for Short Term Goal #2  Reviewed EMMI information sent to patient on exercise  and arthritis with patient        Assessment:  Patient continues to benefit from health coach monthly outreach.    Plan:  RN Health Coach will contact patient within one month and patient agrees to next outreach.     Jone Baseman, RN, MSN [employer] [phone]

## 2014-12-23 LAB — CUP PACEART REMOTE DEVICE CHECK
Battery Voltage: 3.01 V
Brady Statistic AP VP Percent: 1 %
Brady Statistic AS VP Percent: 1 %
Brady Statistic AS VS Percent: 65 %
Date Time Interrogation Session: 20160815060019
Lead Channel Impedance Value: 490 Ohm
Lead Channel Pacing Threshold Amplitude: 0.625 V
Lead Channel Pacing Threshold Pulse Width: 0.4 ms
Lead Channel Sensing Intrinsic Amplitude: 12 mV
Lead Channel Setting Pacing Amplitude: 0.875
Lead Channel Setting Pacing Amplitude: 1.625
Lead Channel Setting Pacing Pulse Width: 0.4 ms
Lead Channel Setting Sensing Sensitivity: 2 mV
MDC IDC MSMT BATTERY REMAINING LONGEVITY: 136 mo
MDC IDC MSMT BATTERY REMAINING PERCENTAGE: 95.5 %
MDC IDC MSMT LEADCHNL RA IMPEDANCE VALUE: 430 Ohm
MDC IDC MSMT LEADCHNL RA PACING THRESHOLD PULSEWIDTH: 0.4 ms
MDC IDC MSMT LEADCHNL RA SENSING INTR AMPL: 3.7 mV
MDC IDC MSMT LEADCHNL RV PACING THRESHOLD AMPLITUDE: 0.625 V
MDC IDC PG SERIAL: 7528878
MDC IDC STAT BRADY AP VS PERCENT: 34 %
MDC IDC STAT BRADY RA PERCENT PACED: 34 %
MDC IDC STAT BRADY RV PERCENT PACED: 1 %
Pulse Gen Model: 2240

## 2014-12-28 ENCOUNTER — Encounter: Payer: Self-pay | Admitting: Cardiology

## 2014-12-28 DIAGNOSIS — E538 Deficiency of other specified B group vitamins: Secondary | ICD-10-CM | POA: Diagnosis not present

## 2014-12-28 DIAGNOSIS — I10 Essential (primary) hypertension: Secondary | ICD-10-CM | POA: Diagnosis not present

## 2014-12-28 DIAGNOSIS — E6609 Other obesity due to excess calories: Secondary | ICD-10-CM | POA: Diagnosis not present

## 2014-12-28 DIAGNOSIS — E1165 Type 2 diabetes mellitus with hyperglycemia: Secondary | ICD-10-CM | POA: Diagnosis not present

## 2014-12-28 DIAGNOSIS — E782 Mixed hyperlipidemia: Secondary | ICD-10-CM | POA: Diagnosis not present

## 2014-12-28 DIAGNOSIS — K210 Gastro-esophageal reflux disease with esophagitis: Secondary | ICD-10-CM | POA: Diagnosis not present

## 2014-12-28 DIAGNOSIS — E8881 Metabolic syndrome: Secondary | ICD-10-CM | POA: Diagnosis not present

## 2014-12-28 DIAGNOSIS — R5382 Chronic fatigue, unspecified: Secondary | ICD-10-CM | POA: Diagnosis not present

## 2014-12-28 DIAGNOSIS — E039 Hypothyroidism, unspecified: Secondary | ICD-10-CM | POA: Diagnosis not present

## 2015-01-04 DIAGNOSIS — I10 Essential (primary) hypertension: Secondary | ICD-10-CM | POA: Diagnosis not present

## 2015-01-04 DIAGNOSIS — G4733 Obstructive sleep apnea (adult) (pediatric): Secondary | ICD-10-CM | POA: Diagnosis not present

## 2015-01-04 DIAGNOSIS — E1165 Type 2 diabetes mellitus with hyperglycemia: Secondary | ICD-10-CM | POA: Diagnosis not present

## 2015-01-04 DIAGNOSIS — E6609 Other obesity due to excess calories: Secondary | ICD-10-CM | POA: Diagnosis not present

## 2015-01-04 DIAGNOSIS — K219 Gastro-esophageal reflux disease without esophagitis: Secondary | ICD-10-CM | POA: Diagnosis not present

## 2015-01-04 DIAGNOSIS — Z0001 Encounter for general adult medical examination with abnormal findings: Secondary | ICD-10-CM | POA: Diagnosis not present

## 2015-01-04 DIAGNOSIS — Z23 Encounter for immunization: Secondary | ICD-10-CM | POA: Diagnosis not present

## 2015-01-04 DIAGNOSIS — E039 Hypothyroidism, unspecified: Secondary | ICD-10-CM | POA: Diagnosis not present

## 2015-01-04 DIAGNOSIS — E8881 Metabolic syndrome: Secondary | ICD-10-CM | POA: Diagnosis not present

## 2015-01-04 DIAGNOSIS — E782 Mixed hyperlipidemia: Secondary | ICD-10-CM | POA: Diagnosis not present

## 2015-01-05 DIAGNOSIS — E039 Hypothyroidism, unspecified: Secondary | ICD-10-CM | POA: Diagnosis not present

## 2015-01-11 DIAGNOSIS — E1140 Type 2 diabetes mellitus with diabetic neuropathy, unspecified: Secondary | ICD-10-CM | POA: Diagnosis not present

## 2015-01-11 DIAGNOSIS — E1151 Type 2 diabetes mellitus with diabetic peripheral angiopathy without gangrene: Secondary | ICD-10-CM | POA: Diagnosis not present

## 2015-01-11 DIAGNOSIS — M79671 Pain in right foot: Secondary | ICD-10-CM | POA: Diagnosis not present

## 2015-01-11 DIAGNOSIS — L600 Ingrowing nail: Secondary | ICD-10-CM | POA: Diagnosis not present

## 2015-01-17 ENCOUNTER — Ambulatory Visit: Payer: Self-pay

## 2015-01-17 ENCOUNTER — Other Ambulatory Visit: Payer: Self-pay

## 2015-01-17 NOTE — Patient Outreach (Signed)
[facility]) Care Management  01/17/2015  Alicia [name] January 01, 1946 [id number]   Telephone call to patient for monthly outreach.  No answer.  HIPAA compliant voice message left.    Plan: RN Health Coach will contact patient within 1-2 weeks.    Jone Baseman, RN, MSN [name] [phone]

## 2015-01-27 ENCOUNTER — Other Ambulatory Visit: Payer: Self-pay

## 2015-01-27 NOTE — Patient Outreach (Signed)
[facility]) Care Management  [place]  01/27/2015   Alicia [name] 02/25/1946 [id number]  Subjective:  Telephone call to patient for monthly outreach.  Patient reports she is doing well.  Patient reports seeing PCP last month and Endocrinologist.  Patient adds she had her flu shot done on visit.    Diabetes: Patient able to add that her blood sugar today was 124 and A1c was 6.9.  Patient reports she does eat pizza and from time to time her blood sugar spikes up after eating pizza.  Discussed with patient diet importance and exercise. She verbalized understanding.   Objective:   Current Medications:  Current Outpatient Prescriptions  Medication Sig Dispense Refill  . apixaban (ELIQUIS) 5 MG TABS tablet Take 5 mg by mouth 2 (two) times daily.    [name] diltiazem (CARDIZEM) 30 MG tablet Take 1 tablet (30 mg total) by mouth every 6 (six) hours as needed. 30 tablet 1  . flecainide (TAMBOCOR) 100 MG tablet Take 3 tablets (300 mg total) by mouth once. AS NEEDED 12 tablet 0  . insulin NPH-regular Human (NOVOLIN 70/30) (70-30) 100 UNIT/ML injection Inject 48 Units into the skin 2 (two) times daily with a meal.     . levothyroxine (SYNTHROID, LEVOTHROID) 100 MCG tablet Take 100 mcg by mouth daily before breakfast.    . lisinopril-hydrochlorothiazide (PRINZIDE,ZESTORETIC) 10-12.5 MG per tablet Take 1 tablet by mouth daily.    . Multiple Vitamins-Minerals (ICAPS) CAPS Take 1 capsule by mouth daily.     [name] (SYSTANE) 0.4-0.3 % SOLN Place 1 drop into both eyes as needed (dry eyes).    . Probiotic Product (PROBIOTIC DAILY PO) Take 1 tablet by mouth daily.     No current facility-administered medications for this visit.    Functional Status:  In your present state of health, do you have any difficulty performing the following activities: 12/20/2014 10/25/2014  Hearing? N N  Vision? N N  Difficulty concentrating or making decisions? N N  Walking or  climbing stairs? N N  Dressing or bathing? N N  Doing errands, shopping? N N  Preparing Food and eating ? N N  Using the Toilet? N N  In the past six months, have you accidently leaked urine? N N  Do you have problems with loss of bowel control? N N  Managing your Medications? N N  Managing your Finances? N N  Housekeeping or managing your Housekeeping? N N    Fall/Depression Screening: PHQ 2/9 Scores 01/27/2015 12/20/2014 11/22/2014 10/25/2014  PHQ - 2 Score 0 0 0 0    Assessment: Patient continues to benefit from health coach outreach for disease management.    Plan:  [facility] CM Care Plan Problem One        Most Recent Value   Care Plan Problem One  Knowledge deficit related to self management of diabetes   Role Documenting the Problem One  [place] for Problem One  Active   THN Long Term Goal (31-90 days)  Patient will be able to verbalize using the plate method in portion controlling food at meals within 90 days   THN Long Term Goal Start Date  01/27/15 [goal continued]   Interventions for Problem One Long Term Goal  Reviewed with patient plate method and portion control.    THN CM Short Term Goal #1 (0-30 days)  Patient will verbalize limiting foods with high carbohydrate counts within 30 days.  THN CM Short Term Goal #1 Start Date  10/25/14   [facility] CM Short Term Goal #1 Met Date  11/22/14   Interventions for Short Term Goal #1  Discussed with patient foods that contain high carbohydrates and importance of limiting carbohydrates and sweets.   THN CM Short Term Goal #2 (0-30 days)  Patient will verbalize at least doing some form of activity within the next 30 days.   THN CM Short Term Goal #2 Start Date  01/27/15 [goal continued]   Interventions for Short Term Goal #2  Reviewed importance of exercise in controlling blood sugar.      RN Health Coach will contact patient within one month and patient agrees to next outreach.    Jone Baseman, RN, MSN [place] [phone]

## 2015-02-22 IMAGING — CR DG CHEST 2V
2 series · 2 of 2 positions shown · non-contrast
Comparison: None.

CLINICAL DATA: Preoperative respiratory examination for pacemaker
placement.  History of hypertension and diabetes.

CHEST - 2 VIEW

[view not recorded (1 of 2)]
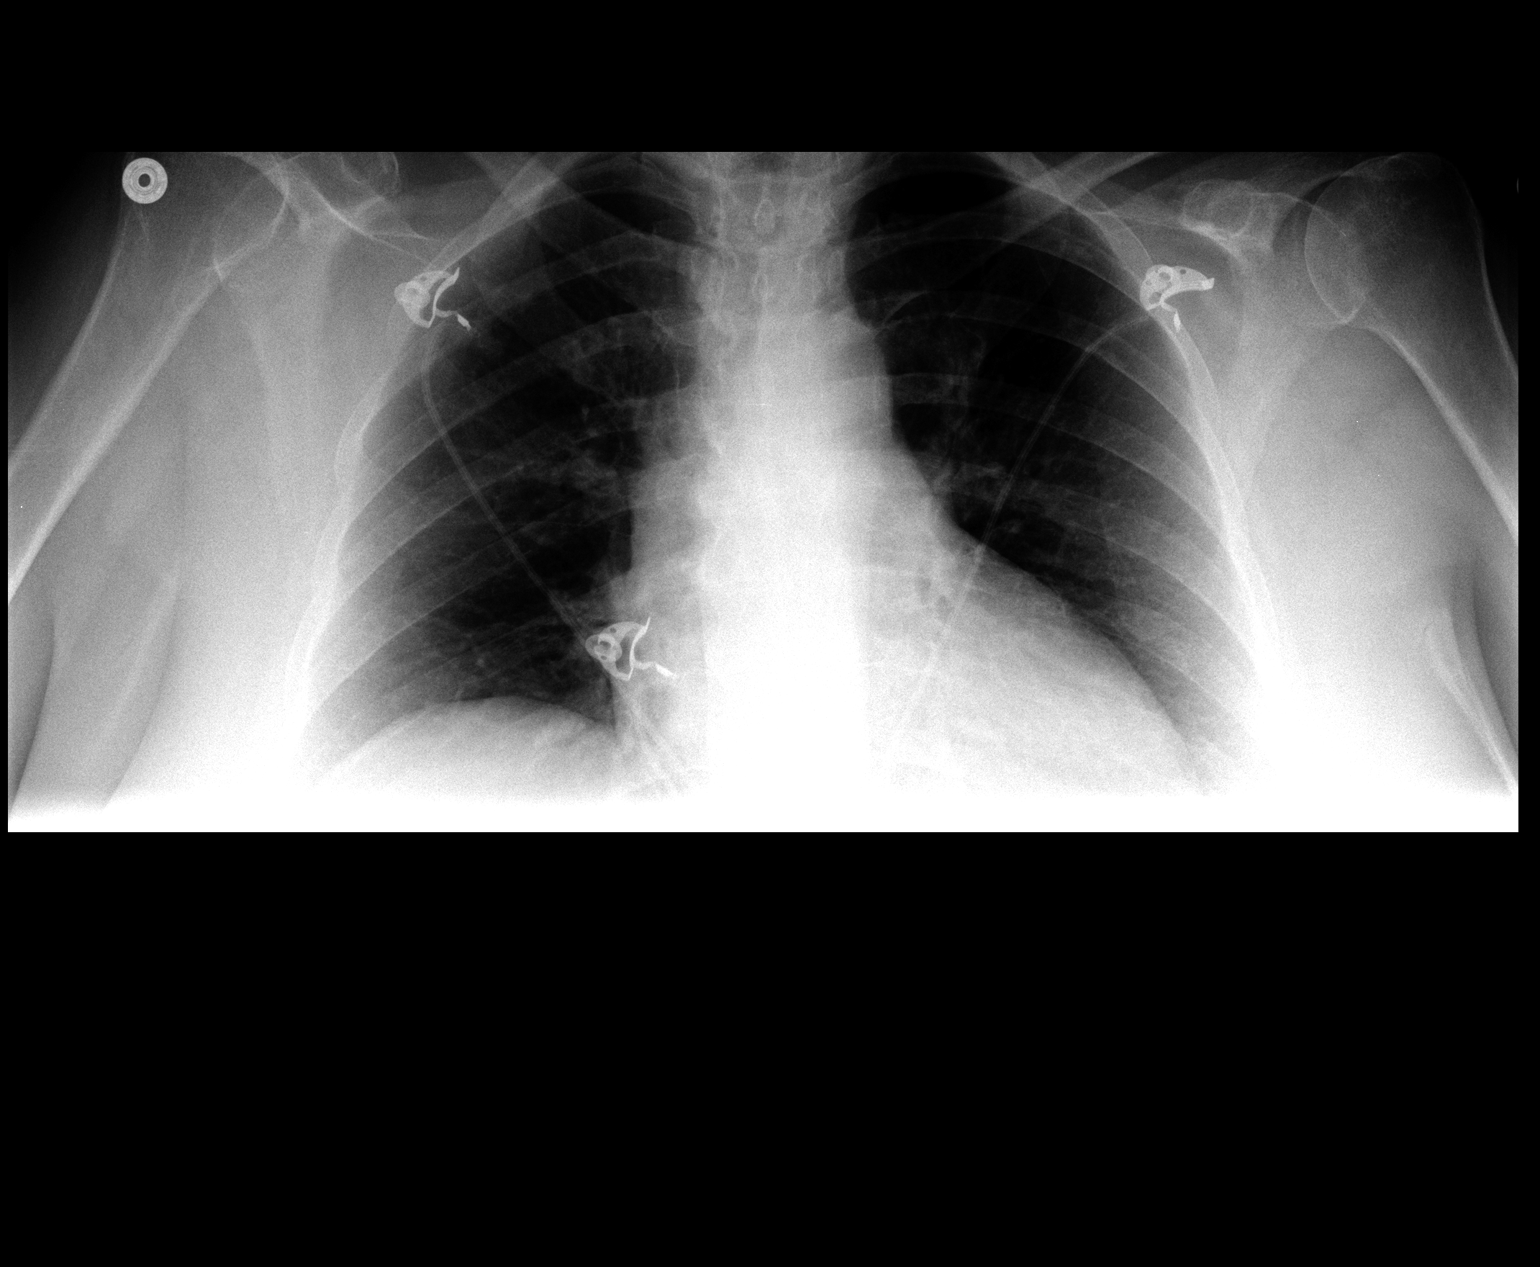

[view not recorded (2 of 2)]
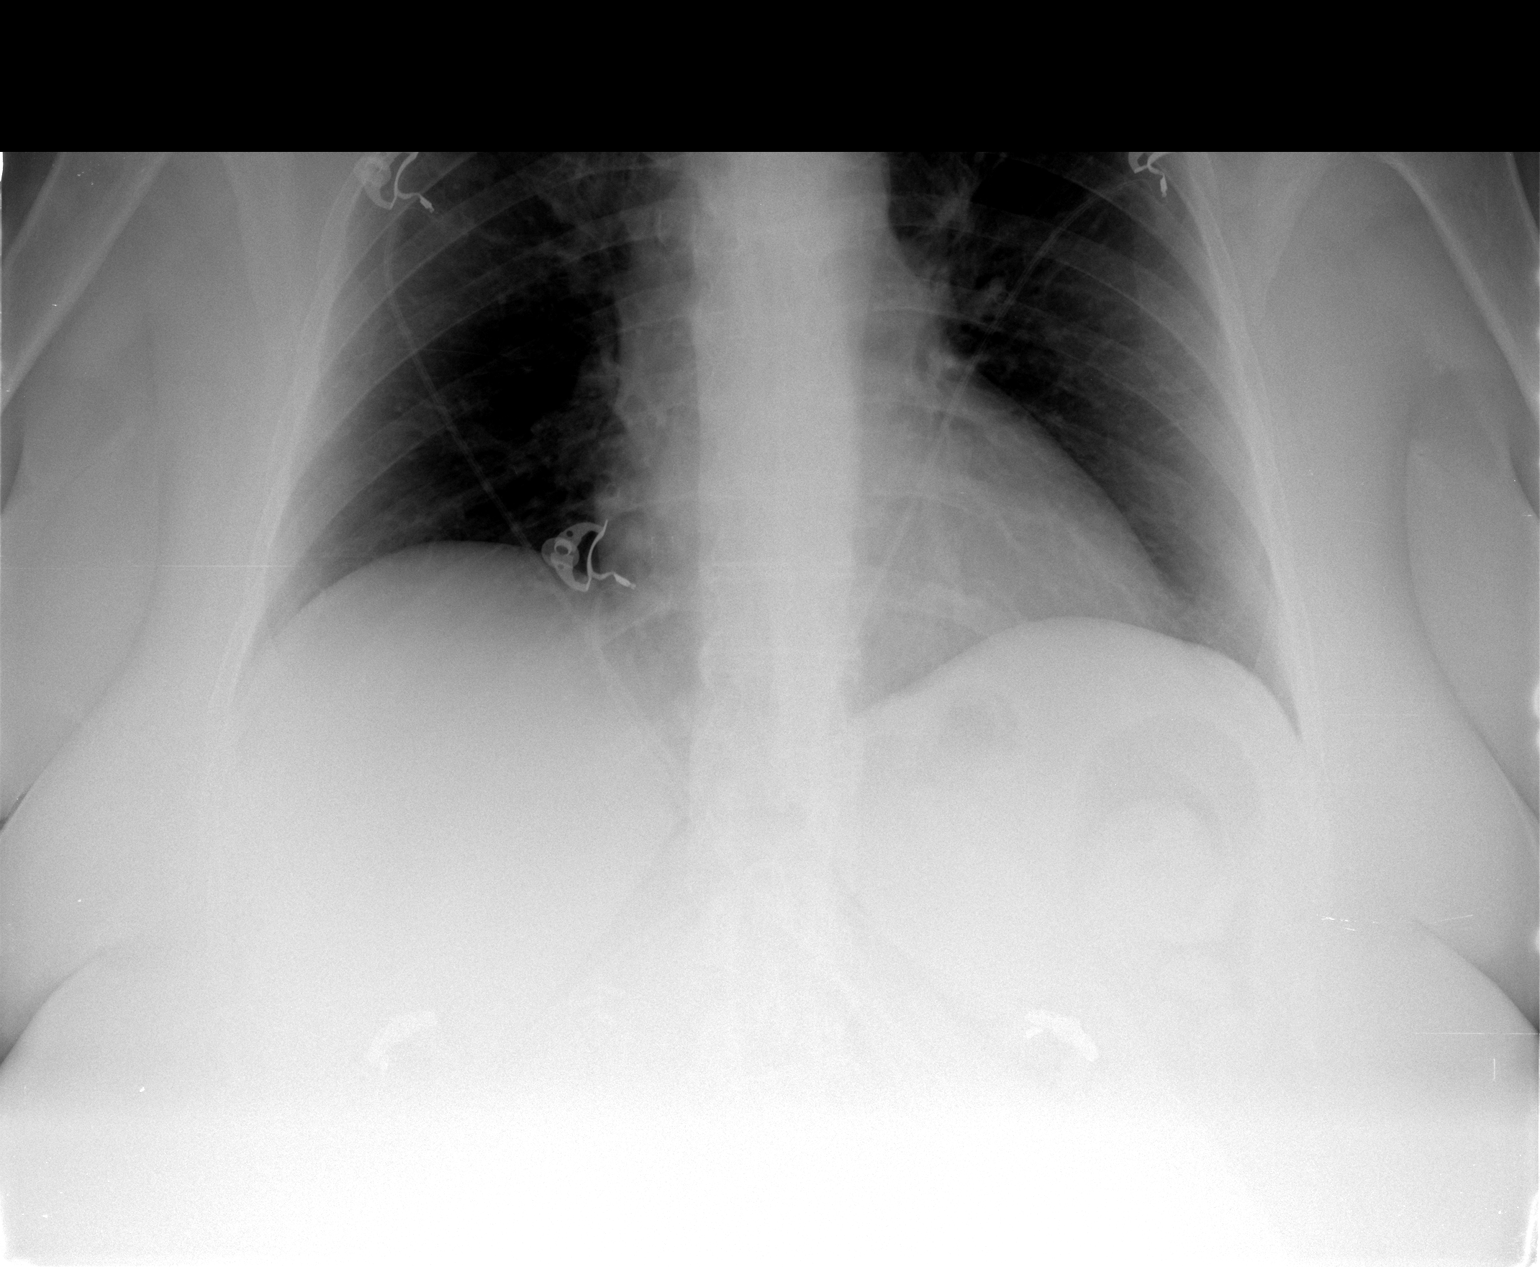

[2 of 2 positions shown; findings below may reference images not displayed]

FINDINGS: The heart size and mediastinal contours are normal.  The
lungs demonstrate mild central airway thickening without
hyperinflation or confluent airspace opacity. There is no pleural
effusion.  Degenerative changes are present throughout the spine.
Cholecystectomy clips are noted.
IMPRESSION: No active cardiopulmonary process.  Mild central airway thickening,
likely chronic.

## 2015-02-24 ENCOUNTER — Ambulatory Visit: Payer: Self-pay

## 2015-02-24 ENCOUNTER — Other Ambulatory Visit: Payer: Self-pay

## 2015-02-24 NOTE — Patient Outreach (Signed)
[place] [facility]) Care Management  02/24/2015  JOYICE MAGDA 03/08/1946 [id number]   Telephone call to patient for monthly call.  No answer.  HIPAA compliant voice message left.    Plan: RN Health Coach will contact patient within 1-2 weeks.    Jone Baseman, RN, MSN [place] [phone]

## 2015-02-25 IMAGING — CR DG CHEST 2V
2 series · 2 of 2 positions shown · non-contrast
Comparison: 12/13/2012

CLINICAL DATA: Pacer insertion, hypertension, diabetes

CHEST - 2 VIEW

[w chest pa]
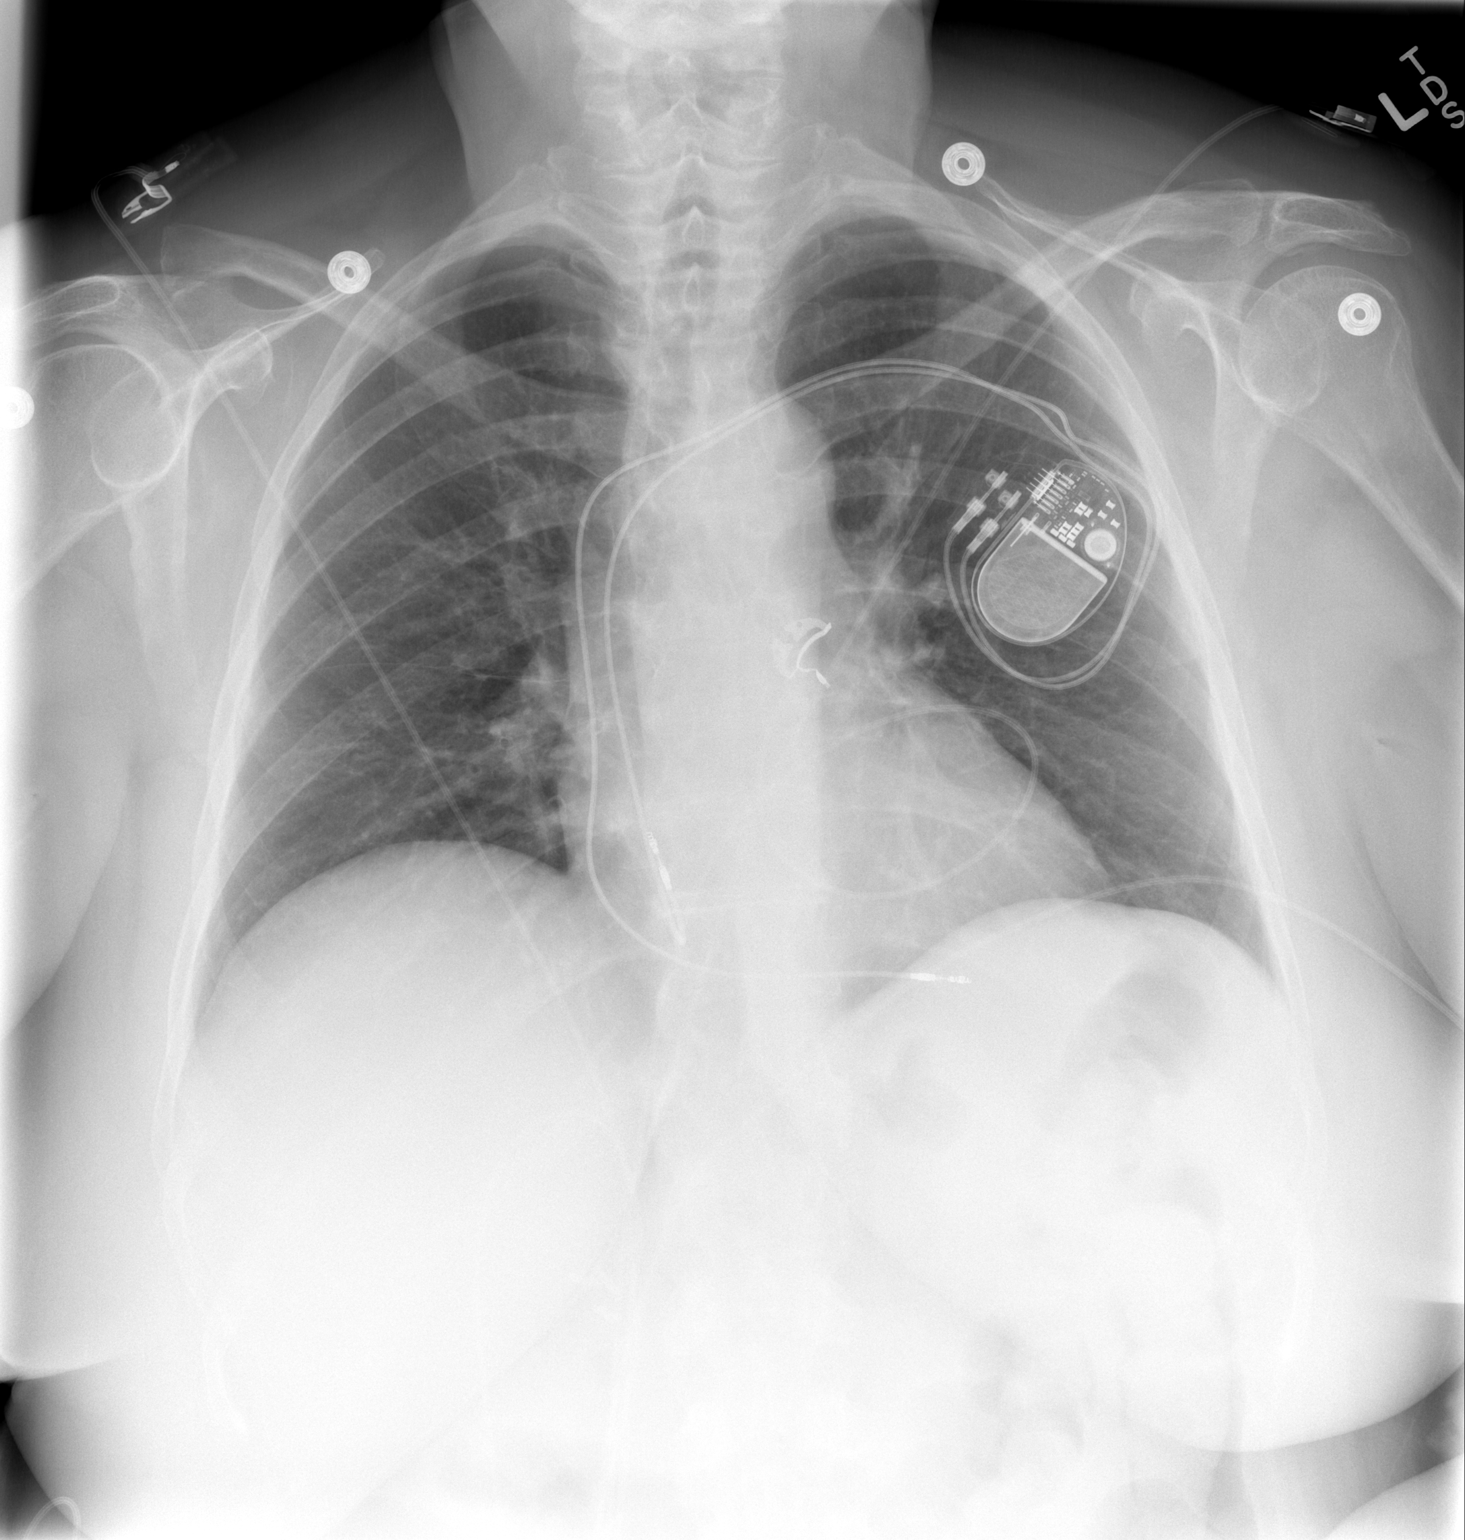

[w chest lat]
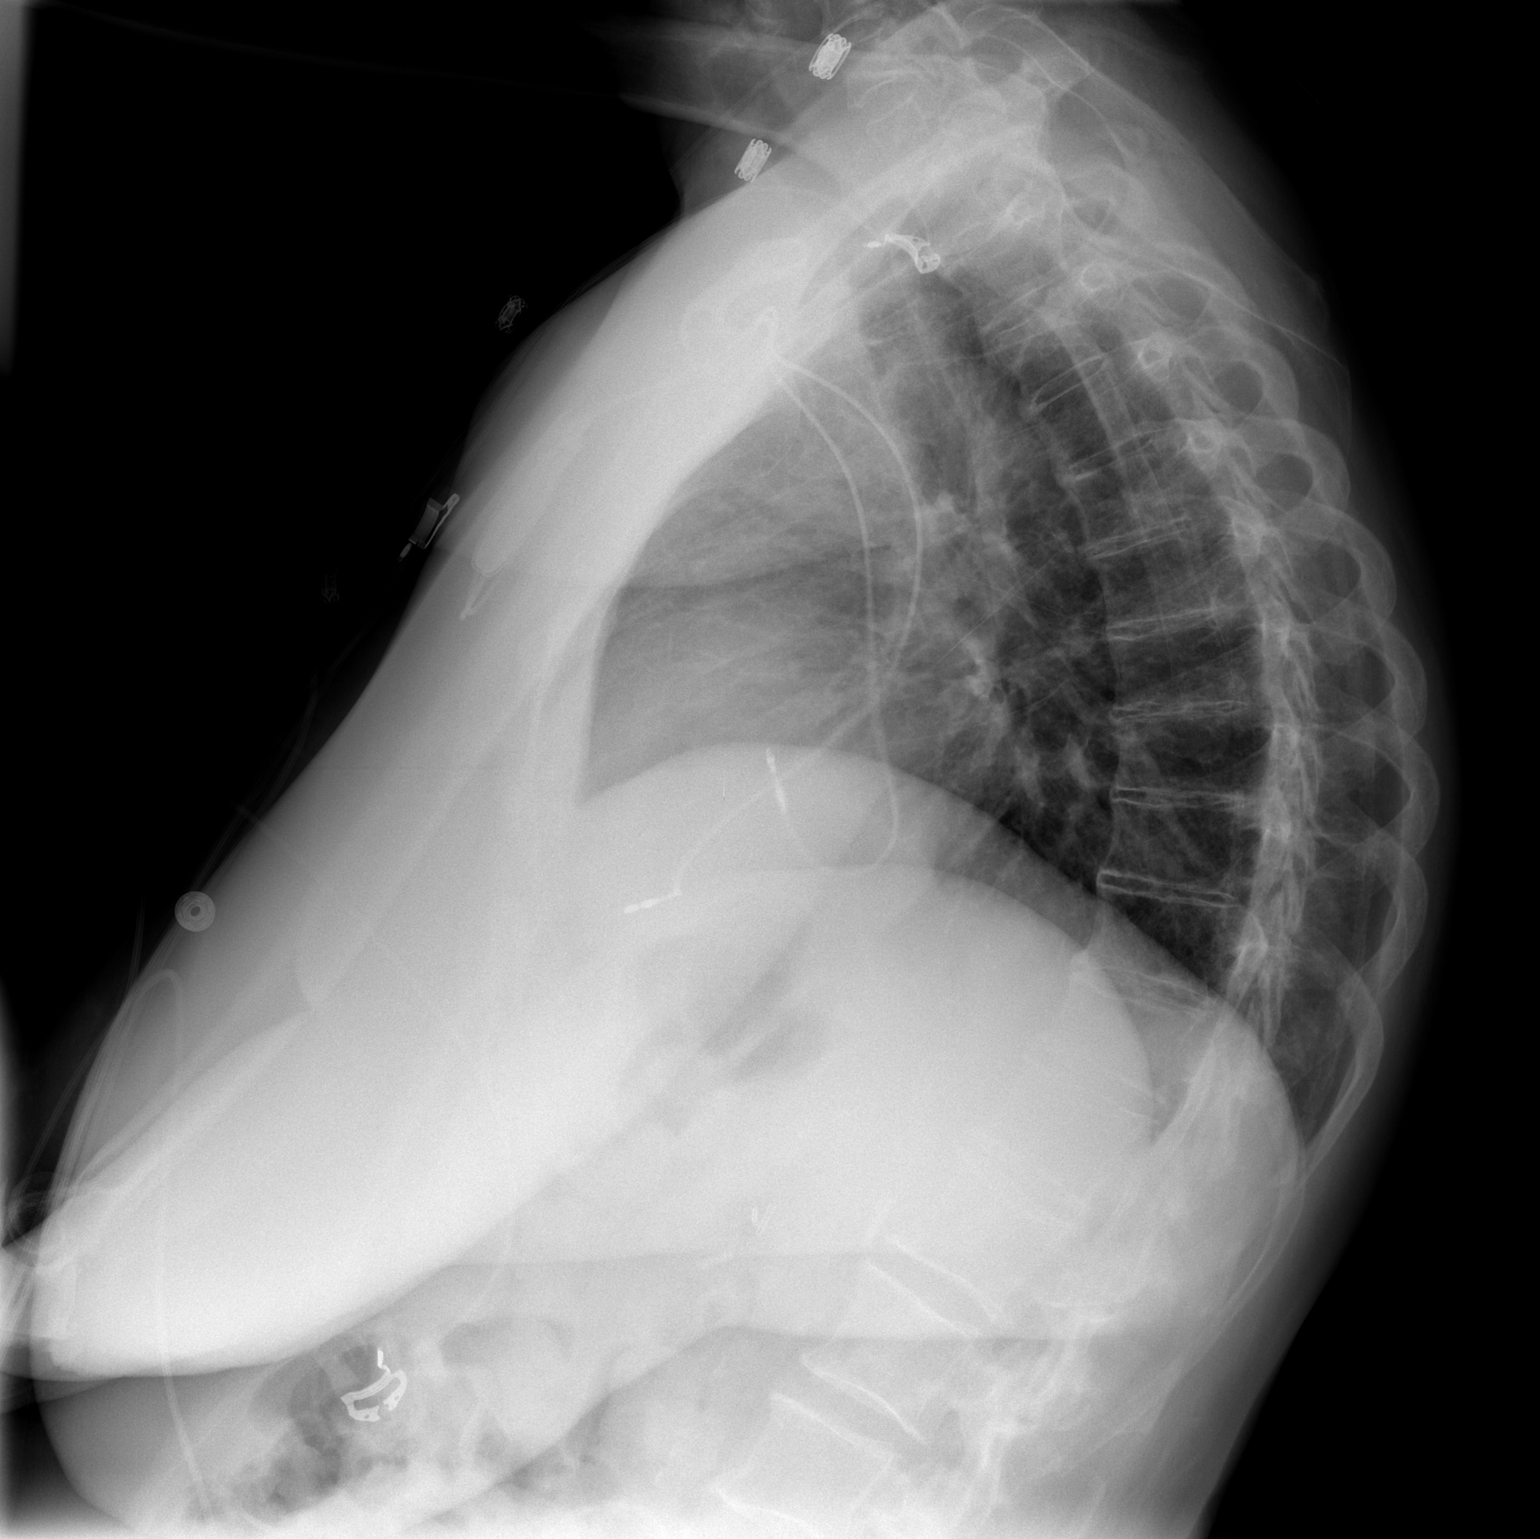

[2 of 2 positions shown; findings below may reference images not displayed]

FINDINGS: Stable heart size and vascularity.  Negative for edema,
pneumonia, collapse, consolidation, effusion or pneumothorax.  Left
subclavian two lead pacer noted.  Trachea is midline.  Degenerative
changes of the spine.  No compression fracture evident.  Prior
cholecystectomy noted.  Stable chronic mild central airway
thickening.
IMPRESSION: Stable chest exam.  No superimposed acute process

## 2015-03-04 ENCOUNTER — Other Ambulatory Visit: Payer: Self-pay

## 2015-03-04 NOTE — Patient Outreach (Signed)
[place] [facility]) Care Management  03/04/2015  Alicia [name] 12-04-1945 [id number]   Telephone call to patient for monthly call.  Patient reports she is on the road to [place] for a week and requests a call back.  Advised patient that health coach would reschedule the call. She verbalized understanding.  Plan: RN Health Coach will contact patient within the next 2 weeks and patient in agreement.    Jone Baseman, RN, MSN [place] [phone]

## 2015-03-17 ENCOUNTER — Other Ambulatory Visit: Payer: Self-pay

## 2015-03-17 ENCOUNTER — Ambulatory Visit: Payer: Self-pay

## 2015-03-17 NOTE — Patient Outreach (Signed)
[place] [facility]) Care Management  03/17/2015  SUSANE HARGREAVES [birth year] [id number]   3rd Telephone call to patient for monthly outreach.  No answer.  HIPAA compliant voice message left.    Plan: RN Health Coach will send letter to attempt outreach.   Jone Baseman, RN, MSN [place] [phone]

## 2015-03-21 ENCOUNTER — Ambulatory Visit (INDEPENDENT_AMBULATORY_CARE_PROVIDER_SITE_OTHER): Payer: Medicare Other | Admitting: *Deleted

## 2015-03-21 DIAGNOSIS — I441 Atrioventricular block, second degree: Secondary | ICD-10-CM | POA: Diagnosis not present

## 2015-03-21 NOTE — Progress Notes (Signed)
Remote pacemaker transmission.   

## 2015-03-29 DIAGNOSIS — E119 Type 2 diabetes mellitus without complications: Secondary | ICD-10-CM | POA: Diagnosis not present

## 2015-03-29 DIAGNOSIS — E039 Hypothyroidism, unspecified: Secondary | ICD-10-CM | POA: Diagnosis not present

## 2015-03-30 LAB — CUP PACEART REMOTE DEVICE CHECK
Brady Statistic AP VP Percent: 1 %
Brady Statistic AP VS Percent: 32 %
Brady Statistic AS VP Percent: 1 %
Brady Statistic RV Percent Paced: 1.1 %
Implantable Lead Implant Date: 20140811
Implantable Lead Location: 753860
Lead Channel Impedance Value: 440 Ohm
Lead Channel Impedance Value: 440 Ohm
Lead Channel Pacing Threshold Amplitude: 0.625 V
Lead Channel Setting Pacing Pulse Width: 0.4 ms
MDC IDC LEAD IMPLANT DT: 20140811
MDC IDC LEAD LOCATION: 753859
MDC IDC MSMT BATTERY REMAINING LONGEVITY: 136 mo
MDC IDC MSMT BATTERY REMAINING PERCENTAGE: 95.5 %
MDC IDC MSMT BATTERY VOLTAGE: 3.01 V
MDC IDC MSMT LEADCHNL RA PACING THRESHOLD AMPLITUDE: 0.5 V
MDC IDC MSMT LEADCHNL RA PACING THRESHOLD PULSEWIDTH: 0.4 ms
MDC IDC MSMT LEADCHNL RA SENSING INTR AMPL: 3.3 mV
MDC IDC MSMT LEADCHNL RV PACING THRESHOLD PULSEWIDTH: 0.4 ms
MDC IDC MSMT LEADCHNL RV SENSING INTR AMPL: 12 mV
MDC IDC SESS DTM: 20161114070014
MDC IDC SET LEADCHNL RA PACING AMPLITUDE: 1.5 V
MDC IDC SET LEADCHNL RV PACING AMPLITUDE: 0.875
MDC IDC SET LEADCHNL RV SENSING SENSITIVITY: 2 mV
MDC IDC STAT BRADY AS VS PERCENT: 66 %
MDC IDC STAT BRADY RA PERCENT PACED: 33 %
Pulse Gen Model: 2240
Pulse Gen Serial Number: 7528878

## 2015-04-01 NOTE — Patient Outreach (Signed)
[place] [facility]) Care Management  04/01/2015  Alicia [name] 15-Sep-1945 [id number]   No response from patient after 3 outreach calls and letter.  Plan: RN Health Coach will forward patient information to Lurline Del, Care Management Assistant for case closure.   RN Health coach will send closure letter to patient and physician.    Jone Baseman, RN, MSN [place] [phone]

## 2015-04-06 ENCOUNTER — Encounter: Payer: Self-pay | Admitting: Cardiology

## 2015-04-13 ENCOUNTER — Telehealth: Payer: Self-pay | Admitting: Internal Medicine

## 2015-04-13 NOTE — Telephone Encounter (Signed)
°  New Prob   Pt has some questions on recalled [employer] devices. Please call.

## 2015-04-15 NOTE — Telephone Encounter (Signed)
Informed patient that her device is not a recall device. Patient voiced understanding. Encouraged patient to call if she has any other questions.

## 2015-04-15 NOTE — Telephone Encounter (Signed)
lmtcb/sss 

## 2015-04-15 NOTE — Telephone Encounter (Signed)
F/u ° ° ° °Pt returning Shakila's phone call. °

## 2015-04-19 DIAGNOSIS — E1151 Type 2 diabetes mellitus with diabetic peripheral angiopathy without gangrene: Secondary | ICD-10-CM | POA: Diagnosis not present

## 2015-04-19 DIAGNOSIS — L600 Ingrowing nail: Secondary | ICD-10-CM | POA: Diagnosis not present

## 2015-04-19 DIAGNOSIS — E1140 Type 2 diabetes mellitus with diabetic neuropathy, unspecified: Secondary | ICD-10-CM | POA: Diagnosis not present

## 2015-04-19 DIAGNOSIS — M79671 Pain in right foot: Secondary | ICD-10-CM | POA: Diagnosis not present

## 2015-05-13 DIAGNOSIS — M170 Bilateral primary osteoarthritis of knee: Secondary | ICD-10-CM | POA: Diagnosis not present

## 2015-05-13 DIAGNOSIS — S83241A Other tear of medial meniscus, current injury, right knee, initial encounter: Secondary | ICD-10-CM | POA: Diagnosis not present

## 2015-05-17 DIAGNOSIS — E8881 Metabolic syndrome: Secondary | ICD-10-CM | POA: Diagnosis not present

## 2015-05-17 DIAGNOSIS — M1990 Unspecified osteoarthritis, unspecified site: Secondary | ICD-10-CM | POA: Diagnosis not present

## 2015-05-17 DIAGNOSIS — K210 Gastro-esophageal reflux disease with esophagitis: Secondary | ICD-10-CM | POA: Diagnosis not present

## 2015-05-17 DIAGNOSIS — E782 Mixed hyperlipidemia: Secondary | ICD-10-CM | POA: Diagnosis not present

## 2015-05-17 DIAGNOSIS — E1165 Type 2 diabetes mellitus with hyperglycemia: Secondary | ICD-10-CM | POA: Diagnosis not present

## 2015-05-17 DIAGNOSIS — I10 Essential (primary) hypertension: Secondary | ICD-10-CM | POA: Diagnosis not present

## 2015-05-24 DIAGNOSIS — E039 Hypothyroidism, unspecified: Secondary | ICD-10-CM | POA: Diagnosis not present

## 2015-05-24 DIAGNOSIS — Z1389 Encounter for screening for other disorder: Secondary | ICD-10-CM | POA: Diagnosis not present

## 2015-05-24 DIAGNOSIS — I10 Essential (primary) hypertension: Secondary | ICD-10-CM | POA: Diagnosis not present

## 2015-05-24 DIAGNOSIS — E6609 Other obesity due to excess calories: Secondary | ICD-10-CM | POA: Diagnosis not present

## 2015-05-24 DIAGNOSIS — K219 Gastro-esophageal reflux disease without esophagitis: Secondary | ICD-10-CM | POA: Diagnosis not present

## 2015-05-24 DIAGNOSIS — Z9189 Other specified personal risk factors, not elsewhere classified: Secondary | ICD-10-CM | POA: Diagnosis not present

## 2015-05-24 DIAGNOSIS — E8881 Metabolic syndrome: Secondary | ICD-10-CM | POA: Diagnosis not present

## 2015-05-24 DIAGNOSIS — G4733 Obstructive sleep apnea (adult) (pediatric): Secondary | ICD-10-CM | POA: Diagnosis not present

## 2015-05-24 DIAGNOSIS — E782 Mixed hyperlipidemia: Secondary | ICD-10-CM | POA: Diagnosis not present

## 2015-05-24 DIAGNOSIS — E1165 Type 2 diabetes mellitus with hyperglycemia: Secondary | ICD-10-CM | POA: Diagnosis not present

## 2015-05-31 DIAGNOSIS — S83241A Other tear of medial meniscus, current injury, right knee, initial encounter: Secondary | ICD-10-CM | POA: Diagnosis not present

## 2015-05-31 DIAGNOSIS — M179 Osteoarthritis of knee, unspecified: Secondary | ICD-10-CM | POA: Diagnosis not present

## 2015-06-04 IMAGING — US US EXTREM UP*R* COMP
1 series · 12 of 12 positions shown · non-contrast
Comparison: CT scan dated 03/12/2013

CLINICAL DATA: Posterior right shoulder pain. Palpable and tender
area of soft tissue fullness. Osseous irregularity of the superior
margin of the scapula seen on CT scan dated 03/22/2013.

EXAM:
RIGHT UPPER EXTREMITY SOFT TISSUE ULTRASOUND COMPLETE
TECHNIQUE: Ultrasound examination was performed including evaluation of the
muscles, tendons, joint, and adjacent soft tissues.

[Series 1: us extrem up*right* comp · 12 acquisitions, 12 frames shown]
[im 1/12]
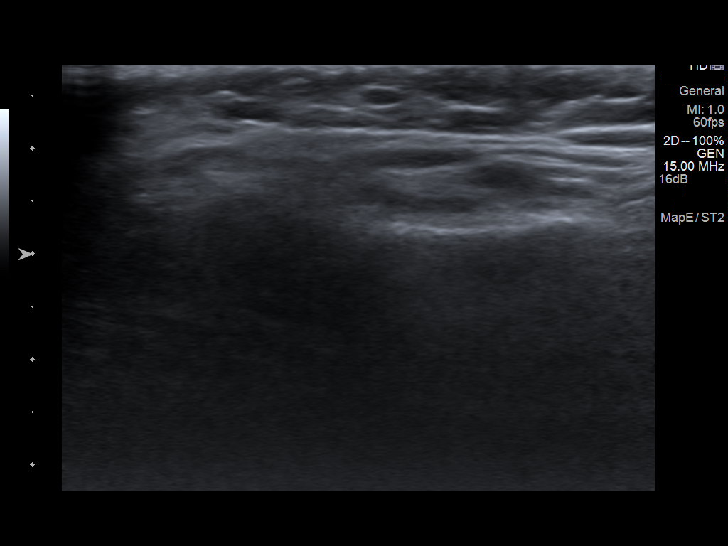
[im 2/12]
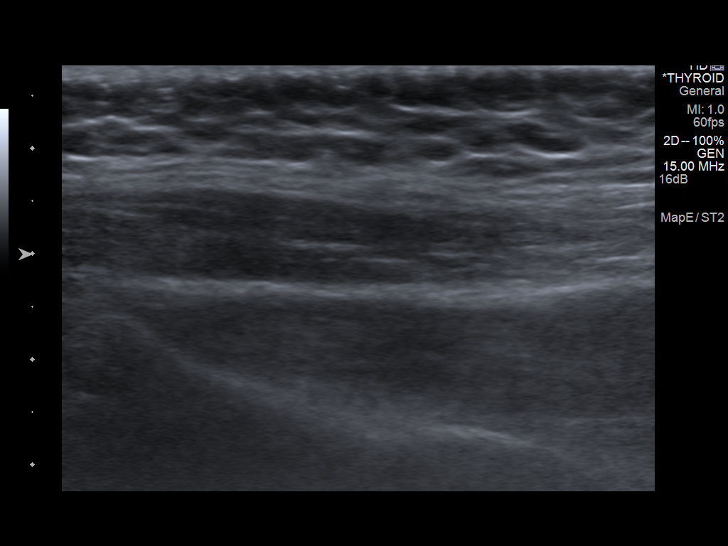
[im 3/12]
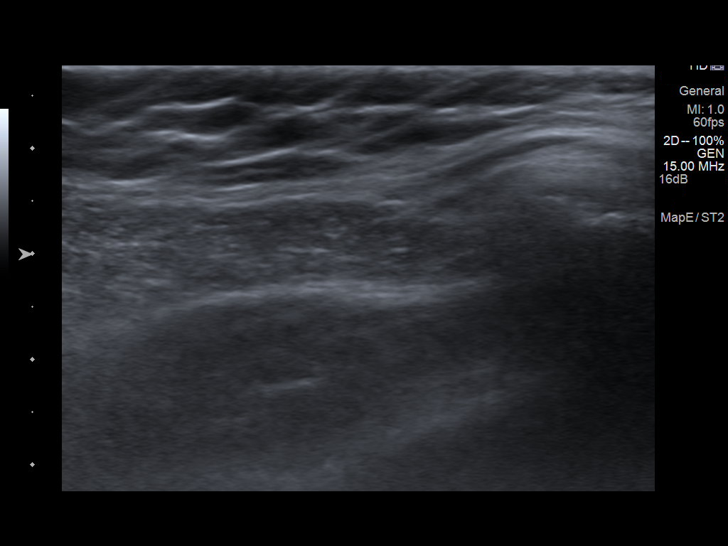
[im 4/12]
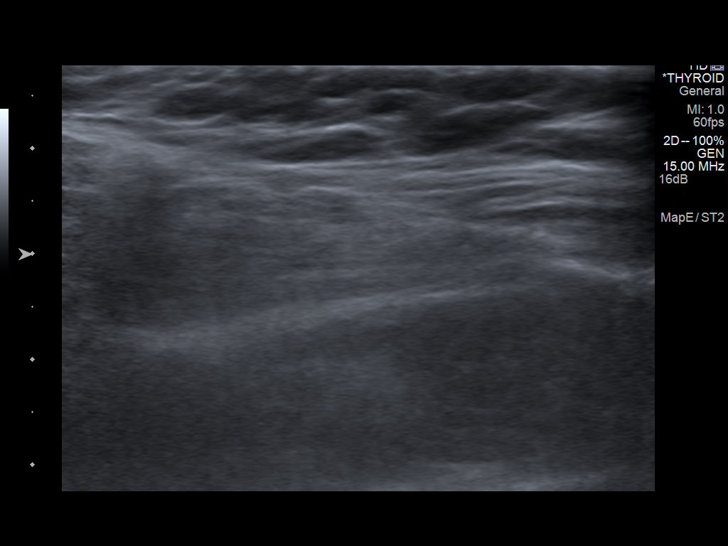
[im 5/12]
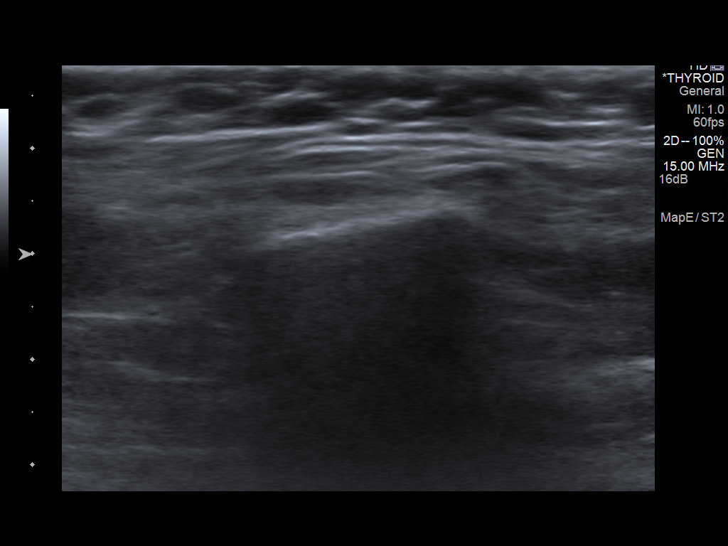
[im 6/12]
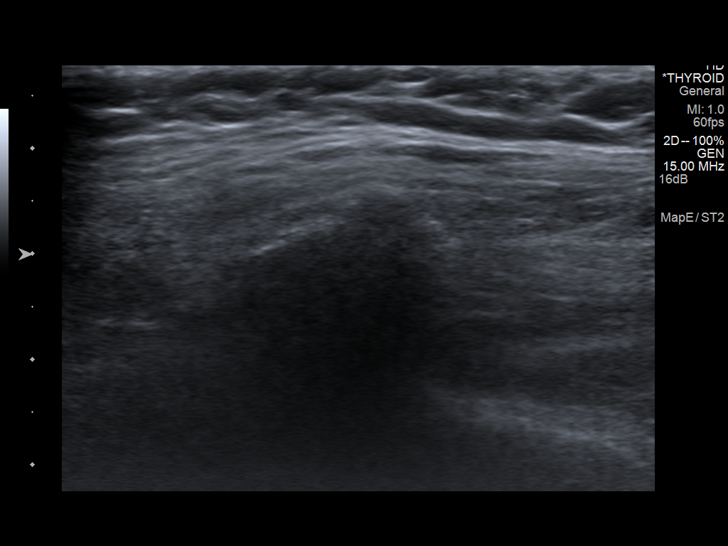
[im 7/12]
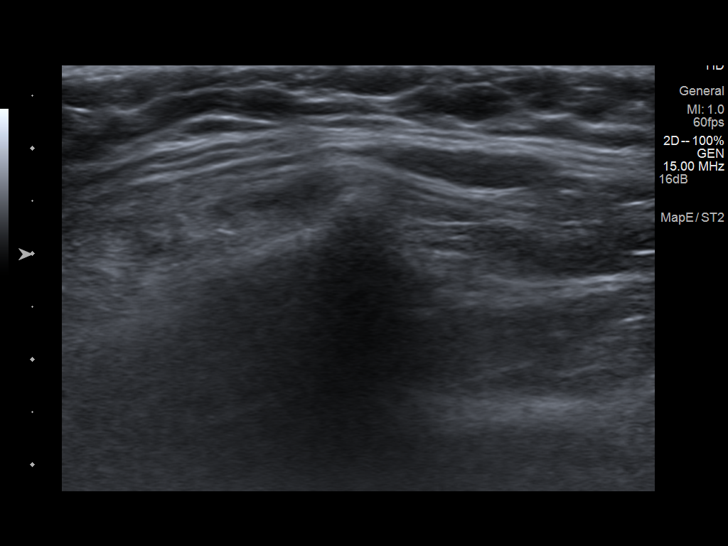
[im 8/12]
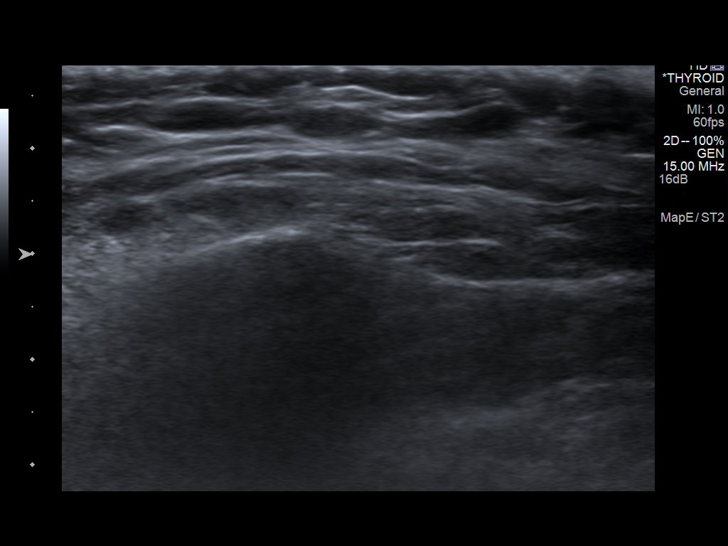
[im 9/12]
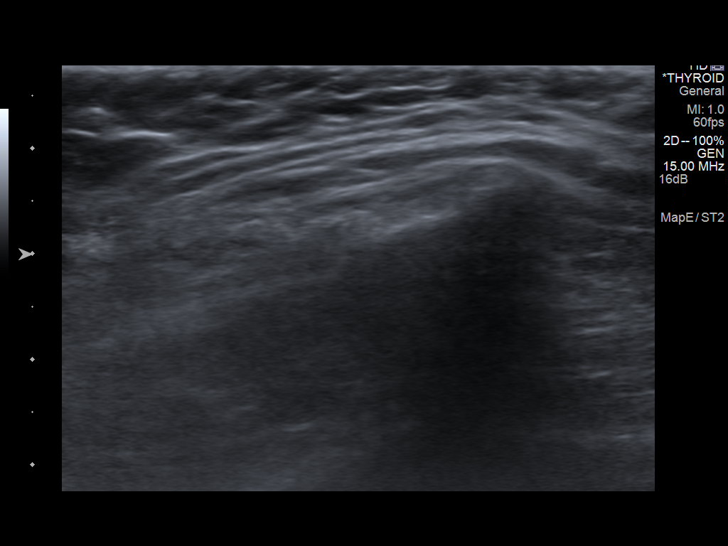
[im 10/12]
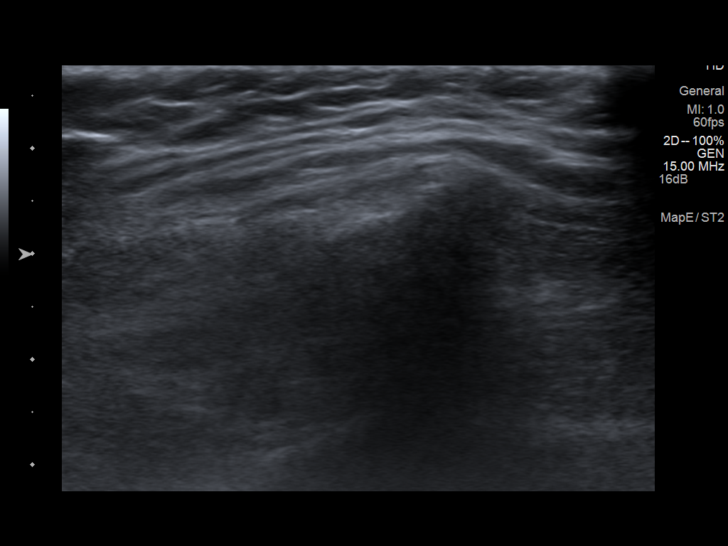
[im 11/12]
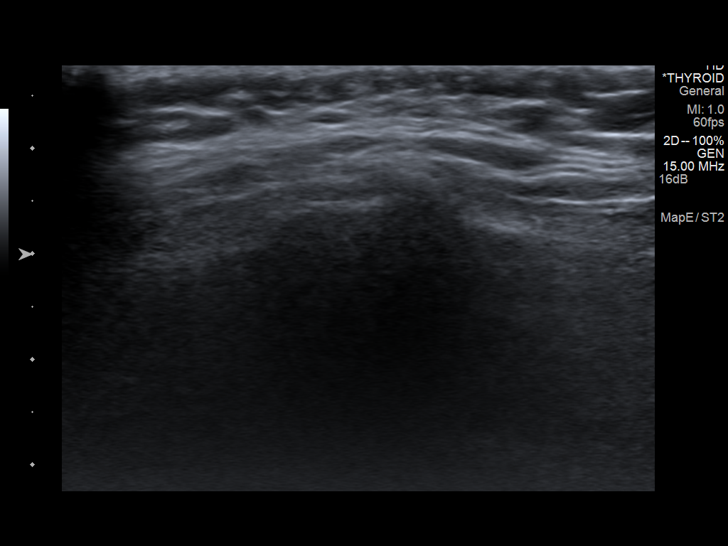
[im 12/12]
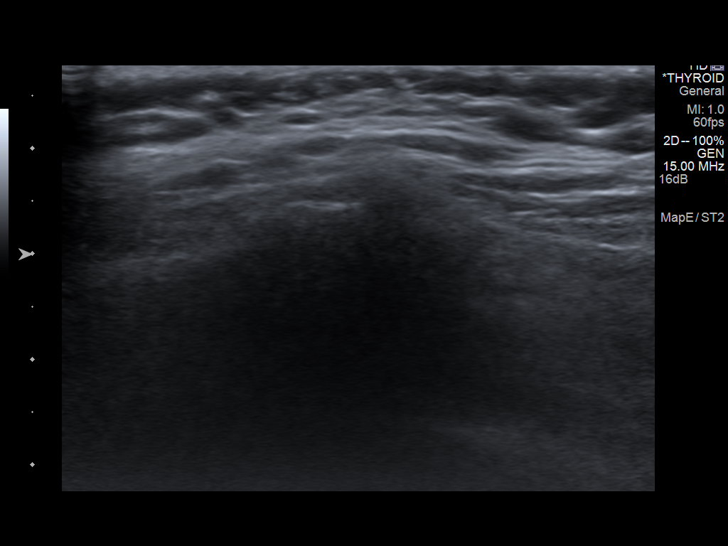

[12 of 12 positions shown; findings below may reference images not displayed]

FINDINGS: I performed realtime ultrasound he is in the 18 megahertz
transducer. The supraspinatus and infraspinatus and teres minor
muscles appear normal. There is a focal area of osseous protuberance
along the superior posterior aspect of the blade of the scapula
which correlates with the finding on CT scan.

I compared this to the opposite scapula and the left scapula has the
same osseous protuberance.

There were no mass lesions and there is no edema or other
abnormality.
IMPRESSION: No appreciable abnormality of the posterior aspect of the right
shoulder. The osseous protuberance on the scapula detected on CT
scan is felt to be an anatomic variant for this patient with the
same appearance on the opposite scapula. The adjacent muscles appear
normal.

## 2015-06-20 ENCOUNTER — Ambulatory Visit (INDEPENDENT_AMBULATORY_CARE_PROVIDER_SITE_OTHER): Payer: Medicare Other | Admitting: *Deleted

## 2015-06-20 DIAGNOSIS — I441 Atrioventricular block, second degree: Secondary | ICD-10-CM

## 2015-06-20 NOTE — Progress Notes (Signed)
Remote pacemaker transmission.   

## 2015-06-21 DIAGNOSIS — E1151 Type 2 diabetes mellitus with diabetic peripheral angiopathy without gangrene: Secondary | ICD-10-CM | POA: Diagnosis not present

## 2015-06-21 DIAGNOSIS — M79671 Pain in right foot: Secondary | ICD-10-CM | POA: Diagnosis not present

## 2015-06-21 DIAGNOSIS — L600 Ingrowing nail: Secondary | ICD-10-CM | POA: Diagnosis not present

## 2015-06-21 DIAGNOSIS — E1140 Type 2 diabetes mellitus with diabetic neuropathy, unspecified: Secondary | ICD-10-CM | POA: Diagnosis not present

## 2015-06-22 ENCOUNTER — Encounter: Payer: Self-pay | Admitting: Cardiology

## 2015-06-22 LAB — CUP PACEART REMOTE DEVICE CHECK
Battery Remaining Percentage: 95.5 %
Brady Statistic AP VP Percent: 1 %
Brady Statistic AP VS Percent: 31 %
Brady Statistic AS VS Percent: 68 %
Implantable Lead Implant Date: 20140811
Implantable Lead Location: 753860
Lead Channel Impedance Value: 430 Ohm
Lead Channel Impedance Value: 450 Ohm
Lead Channel Pacing Threshold Amplitude: 0.625 V
Lead Channel Pacing Threshold Pulse Width: 0.4 ms
Lead Channel Sensing Intrinsic Amplitude: 3.6 mV
Lead Channel Setting Pacing Amplitude: 0.875
Lead Channel Setting Pacing Pulse Width: 0.4 ms
MDC IDC LEAD IMPLANT DT: 20140811
MDC IDC LEAD LOCATION: 753859
MDC IDC MSMT BATTERY REMAINING LONGEVITY: 136 mo
MDC IDC MSMT BATTERY VOLTAGE: 3.01 V
MDC IDC MSMT LEADCHNL RA PACING THRESHOLD AMPLITUDE: 0.5 V
MDC IDC MSMT LEADCHNL RA PACING THRESHOLD PULSEWIDTH: 0.4 ms
MDC IDC MSMT LEADCHNL RV SENSING INTR AMPL: 12 mV
MDC IDC PG SERIAL: 7528878
MDC IDC SESS DTM: 20170213083834
MDC IDC SET LEADCHNL RA PACING AMPLITUDE: 1.5 V
MDC IDC SET LEADCHNL RV SENSING SENSITIVITY: 2 mV
MDC IDC STAT BRADY AS VP PERCENT: 1 %
MDC IDC STAT BRADY RA PERCENT PACED: 32 %
MDC IDC STAT BRADY RV PERCENT PACED: 1.1 %

## 2015-06-28 ENCOUNTER — Telehealth: Payer: Self-pay | Admitting: *Deleted

## 2015-06-28 DIAGNOSIS — L570 Actinic keratosis: Secondary | ICD-10-CM | POA: Diagnosis not present

## 2015-06-28 NOTE — Telephone Encounter (Signed)
Pt calling to find out when the 3 hours of AT/AF on remote transmission was- 05/04/16 1:18am. Pt appreciative.

## 2015-06-29 DIAGNOSIS — M25561 Pain in right knee: Secondary | ICD-10-CM | POA: Diagnosis not present

## 2015-06-29 DIAGNOSIS — R262 Difficulty in walking, not elsewhere classified: Secondary | ICD-10-CM | POA: Diagnosis not present

## 2015-06-29 DIAGNOSIS — M25562 Pain in left knee: Secondary | ICD-10-CM | POA: Diagnosis not present

## 2015-06-29 DIAGNOSIS — M170 Bilateral primary osteoarthritis of knee: Secondary | ICD-10-CM | POA: Diagnosis not present

## 2015-07-06 DIAGNOSIS — R2689 Other abnormalities of gait and mobility: Secondary | ICD-10-CM | POA: Diagnosis not present

## 2015-07-06 DIAGNOSIS — M170 Bilateral primary osteoarthritis of knee: Secondary | ICD-10-CM | POA: Diagnosis not present

## 2015-07-06 DIAGNOSIS — M25561 Pain in right knee: Secondary | ICD-10-CM | POA: Diagnosis not present

## 2015-07-06 DIAGNOSIS — M1711 Unilateral primary osteoarthritis, right knee: Secondary | ICD-10-CM | POA: Diagnosis not present

## 2015-07-06 DIAGNOSIS — M25562 Pain in left knee: Secondary | ICD-10-CM | POA: Diagnosis not present

## 2015-07-13 DIAGNOSIS — R2689 Other abnormalities of gait and mobility: Secondary | ICD-10-CM | POA: Diagnosis not present

## 2015-07-13 DIAGNOSIS — M1711 Unilateral primary osteoarthritis, right knee: Secondary | ICD-10-CM | POA: Diagnosis not present

## 2015-07-13 DIAGNOSIS — M25561 Pain in right knee: Secondary | ICD-10-CM | POA: Diagnosis not present

## 2015-07-13 DIAGNOSIS — M170 Bilateral primary osteoarthritis of knee: Secondary | ICD-10-CM | POA: Diagnosis not present

## 2015-07-13 DIAGNOSIS — M25562 Pain in left knee: Secondary | ICD-10-CM | POA: Diagnosis not present

## 2015-07-19 DIAGNOSIS — H6123 Impacted cerumen, bilateral: Secondary | ICD-10-CM | POA: Diagnosis not present

## 2015-07-20 DIAGNOSIS — R2689 Other abnormalities of gait and mobility: Secondary | ICD-10-CM | POA: Diagnosis not present

## 2015-07-20 DIAGNOSIS — M170 Bilateral primary osteoarthritis of knee: Secondary | ICD-10-CM | POA: Diagnosis not present

## 2015-07-20 DIAGNOSIS — M25561 Pain in right knee: Secondary | ICD-10-CM | POA: Diagnosis not present

## 2015-07-20 DIAGNOSIS — M25562 Pain in left knee: Secondary | ICD-10-CM | POA: Diagnosis not present

## 2015-08-02 DIAGNOSIS — M25562 Pain in left knee: Secondary | ICD-10-CM | POA: Diagnosis not present

## 2015-08-02 DIAGNOSIS — M25561 Pain in right knee: Secondary | ICD-10-CM | POA: Diagnosis not present

## 2015-08-02 DIAGNOSIS — M170 Bilateral primary osteoarthritis of knee: Secondary | ICD-10-CM | POA: Diagnosis not present

## 2015-08-02 DIAGNOSIS — R2689 Other abnormalities of gait and mobility: Secondary | ICD-10-CM | POA: Diagnosis not present

## 2015-08-09 DIAGNOSIS — L986 Other infiltrative disorders of the skin and subcutaneous tissue: Secondary | ICD-10-CM | POA: Diagnosis not present

## 2015-08-09 DIAGNOSIS — L309 Dermatitis, unspecified: Secondary | ICD-10-CM | POA: Diagnosis not present

## 2015-08-09 DIAGNOSIS — L570 Actinic keratosis: Secondary | ICD-10-CM | POA: Diagnosis not present

## 2015-08-09 DIAGNOSIS — D485 Neoplasm of uncertain behavior of skin: Secondary | ICD-10-CM | POA: Diagnosis not present

## 2015-08-24 DIAGNOSIS — L280 Lichen simplex chronicus: Secondary | ICD-10-CM | POA: Diagnosis not present

## 2015-08-24 DIAGNOSIS — L509 Urticaria, unspecified: Secondary | ICD-10-CM | POA: Diagnosis not present

## 2015-09-06 DIAGNOSIS — L600 Ingrowing nail: Secondary | ICD-10-CM | POA: Diagnosis not present

## 2015-09-06 DIAGNOSIS — E1151 Type 2 diabetes mellitus with diabetic peripheral angiopathy without gangrene: Secondary | ICD-10-CM | POA: Diagnosis not present

## 2015-09-06 DIAGNOSIS — M79671 Pain in right foot: Secondary | ICD-10-CM | POA: Diagnosis not present

## 2015-09-06 DIAGNOSIS — E1140 Type 2 diabetes mellitus with diabetic neuropathy, unspecified: Secondary | ICD-10-CM | POA: Diagnosis not present

## 2015-09-09 ENCOUNTER — Encounter: Payer: Self-pay | Admitting: Internal Medicine

## 2015-09-09 ENCOUNTER — Ambulatory Visit (INDEPENDENT_AMBULATORY_CARE_PROVIDER_SITE_OTHER): Payer: Medicare Other | Admitting: Internal Medicine

## 2015-09-09 VITALS — BP 150/76 | HR 61 | Ht 66.0 in | Wt 243.0 lb

## 2015-09-09 DIAGNOSIS — I441 Atrioventricular block, second degree: Secondary | ICD-10-CM | POA: Diagnosis not present

## 2015-09-09 DIAGNOSIS — I10 Essential (primary) hypertension: Secondary | ICD-10-CM

## 2015-09-09 DIAGNOSIS — I480 Paroxysmal atrial fibrillation: Secondary | ICD-10-CM

## 2015-09-09 LAB — CUP PACEART INCLINIC DEVICE CHECK
Brady Statistic RA Percent Paced: 31 %
Brady Statistic RV Percent Paced: 1.1 %
Date Time Interrogation Session: 20170505135814
Implantable Lead Location: 753860
Lead Channel Impedance Value: 462.5 Ohm
Lead Channel Pacing Threshold Amplitude: 0.75 V
Lead Channel Pacing Threshold Amplitude: 0.75 V
Lead Channel Pacing Threshold Pulse Width: 0.4 ms
Lead Channel Sensing Intrinsic Amplitude: 3.8 mV
Lead Channel Setting Pacing Amplitude: 0.875
Lead Channel Setting Pacing Pulse Width: 0.4 ms
MDC IDC LEAD IMPLANT DT: 20140811
MDC IDC LEAD IMPLANT DT: 20140811
MDC IDC LEAD LOCATION: 753859
MDC IDC MSMT BATTERY REMAINING LONGEVITY: 142.8
MDC IDC MSMT BATTERY VOLTAGE: 3.01 V
MDC IDC MSMT LEADCHNL RA PACING THRESHOLD AMPLITUDE: 0.5 V
MDC IDC MSMT LEADCHNL RA PACING THRESHOLD AMPLITUDE: 0.5 V
MDC IDC MSMT LEADCHNL RA PACING THRESHOLD PULSEWIDTH: 0.4 ms
MDC IDC MSMT LEADCHNL RA PACING THRESHOLD PULSEWIDTH: 0.4 ms
MDC IDC MSMT LEADCHNL RV IMPEDANCE VALUE: 412.5 Ohm
MDC IDC MSMT LEADCHNL RV PACING THRESHOLD PULSEWIDTH: 0.4 ms
MDC IDC MSMT LEADCHNL RV SENSING INTR AMPL: 12 mV
MDC IDC SET LEADCHNL RA PACING AMPLITUDE: 1.5 V
MDC IDC SET LEADCHNL RV SENSING SENSITIVITY: 2 mV
Pulse Gen Model: 2240
Pulse Gen Serial Number: 7528878

## 2015-09-09 MED ORDER — FLECAINIDE ACETATE 100 MG PO TABS: 300.0000 mg | ORAL_TABLET | Freq: Once | ORAL | Status: DC

## 2015-09-09 MED ORDER — APIXABAN 5 MG PO TABS: 5.0000 mg | ORAL_TABLET | Freq: Two times a day (BID) | ORAL | Status: DC

## 2015-09-09 MED ORDER — DILTIAZEM HCL 30 MG PO TABS: 30.0000 mg | ORAL_TABLET | Freq: Four times a day (QID) | ORAL | Status: DC | PRN

## 2015-09-09 NOTE — Progress Notes (Signed)
Electrophysiology Office Note   Date:  09/09/2015   ID:  Alicia [name], Alicia [name] [birth year], MRN [id number]  PCP:  Gar Ponto, MD    Primary Electrophysiologist: Thompson Grayer, MD    Chief Complaint  Patient presents with  . Atrial Fibrillation     History of Present Illness: LADELL HOUTMAN is a 70 y.o. female who presents today for electrophysiology follow-up.  She has done great this past year with no symptomatic afib.  She has not required flecainide or cardizem.  She has made no progress with weight reduction. Today, she denies symptoms of chest pain, shortness of breath, orthopnea, PND, lower extremity edema, claudication, dizziness, presyncope, syncope, bleeding, or neurologic sequela. The patient is tolerating medications without difficulties and is otherwise without complaint today.    Past Medical History  Diagnosis Date  . Obesity   . Diabetes ([place])   . DJD (degenerative joint disease)   . Hypothyroidism   . Dysrhythmia     SYMPTOMATIC BRADYCARDIA  . 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 Anemia   . Hypertension     off meds at this time  . Wears glasses    Past Surgical History  Procedure Laterality Date  . Cholecystectomy    . 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 Shoulder surgery Right 2012  . Breast surgery      biopsy  . Knee surgery      right  . Ankle surgery Right     multiple  . Foot arthrotomy      left  . Colonoscopy    . Eye surgery      both cataracts  . Shoulder arthroscopy with subacromial decompression and open rotator c Right 07/07/2013    Procedure: RIGHT SHOULDER ARTHROSCOPY WITH SUBACROMIAL DECOMPRESSION AND ROTATOR CUFF DEBRIDEMENT/ DISTAL CLAVICLE RESECTION;  Surgeon: [name], MD;  Location: [place];  Service: Orthopedics;  Laterality: Right;  . Permanent pacemaker insertion N/A 12/15/2012    SJM Assurity DR  implanted by Dr Lovena Le for transient AV block     Current Outpatient Prescriptions  Medication Sig Dispense Refill  . apixaban (ELIQUIS) 5 MG TABS tablet Take 5 mg by mouth 2 (two) times daily.    [name] diltiazem (CARDIZEM) 30 MG tablet Take 1 tablet (30 mg total) by mouth every 6 (six) hours as needed. 30 tablet 1  . escitalopram (LEXAPRO) 10 MG tablet Take 1 tablet by mouth daily.    . flecainide (TAMBOCOR) 100 MG tablet Take 3 tablets (300 mg total) by mouth once. AS NEEDED 12 tablet 0  . insulin NPH-regular Human (NOVOLIN 70/30) (70-30) 100 UNIT/ML injection Inject 48 Units into the skin 2 (two) times daily with a meal.     . levothyroxine (SYNTHROID, LEVOTHROID) 100 MCG tablet Take 100 mcg by mouth daily before breakfast.    . lisinopril-hydrochlorothiazide (PRINZIDE,ZESTORETIC) 10-12.5 MG per tablet Take 1 tablet by mouth daily.    [name] loratadine (CLARITIN) 10 MG tablet Take 10 mg by mouth 2 (two) times daily.    . Multiple Vitamins-Minerals (ICAPS) CAPS Take 1 capsule by mouth daily.     [name] (SYSTANE) 0.4-0.3 % SOLN Place 1 drop into both eyes as needed (dry eyes).    . Probiotic Product (PROBIOTIC DAILY PO) Take 1 tablet by mouth daily.    [name]  ranitidine (ZANTAC) 75 MG tablet Take 150 mg by mouth 2 (two) times daily.     No current facility-administered medications for this visit.    Allergies:   Ace inhibitors; Cardizem; Statins; Sulfa antibiotics; Tape; Cortisone; and Latex   Social History:  The patient  reports that she has never smoked. She has never used smokeless tobacco. She reports that she does not drink alcohol or use illicit drugs.   Family History:  The patient's  family history includes Cancer in her father; Stroke in her mother; Sudden death in her brother.    ROS:  Please see the history of present illness.   All other systems are reviewed and negative.    PHYSICAL EXAM: VS:  BP 150/76 mmHg  Pulse 61  Ht 5\' 6"  (1.676 m)  Wt 243 lb  (110.224 kg)  BMI 39.24 kg/m2 , BMI Body mass index is 39.24 kg/(m^2). GEN: Well nourished, well developed, in no acute distress HEENT: normal Neck: no JVD, carotid bruits, or masses Cardiac: RRR; no murmurs, rubs, or gallops,no edema  Respiratory:  clear to auscultation bilaterally, normal work of breathing GI: soft, nontender, nondistended, + BS MS: no deformity or atrophy Skin: warm and dry  Neuro:  Strength and sensation are intact Psych: euthymic mood, full affect Pacemaker pocket is well healed  Pacemaker interrogation is reviewd today (see paceart)    Wt Readings from Last 3 Encounters:  09/09/15 243 lb (110.224 kg)  10/25/14 239 lb (108.41 kg)  10/22/14 239 lb (108.41 kg)      ASSESSMENT AND PLAN:  1.  Paroxysmal atrial fibrillation Doing well at this time (AF burden <1%) Lifestyle modification is essential for [place] to manage her afib.  I am concerned with her inability to use CPAP, exercise, or lose weight.  There is an abundance of data which suggests that without these changes her Afib will continue to recur and progress.  She is not interested.  She has flecainide which she can use as a pill in pocket (300mg  po x 1 when AF occurs) chads2vasc score is 4.  She is tolerating eliquis.  Says PCP is following bmet, cbc  2. Second degree AV block Normal pacemaker function See Pace Art report No changes today  3. Obesity Body mass index is 39.24 kg/(m^2). As above   Current medicines are reviewed at length with the patient today.   The patient does not have concerns regarding her medicines.  The following changes were made today:  none  [name] I will see again in 12 months unless afib recurs  Signed, Thompson Grayer, MD  09/09/2015 11:35 AM     [facility] [address] [place] [address] [phone] (office) [phone] (fax)

## 2015-09-09 NOTE — Patient Instructions (Signed)
Your physician recommends that you continue on your current medications as directed. Please refer to the Current Medication list given to you today. Device check on 12/12/15. Your physician recommends that you schedule a follow-up appointment in: 1 year with Dr. Allred. Please schedule this appointment today before leaving the office.  

## 2015-09-15 DIAGNOSIS — L299 Pruritus, unspecified: Secondary | ICD-10-CM | POA: Diagnosis not present

## 2015-09-15 DIAGNOSIS — L509 Urticaria, unspecified: Secondary | ICD-10-CM | POA: Diagnosis not present

## 2015-09-19 ENCOUNTER — Encounter: Payer: Self-pay | Admitting: Allergy and Immunology

## 2015-09-19 ENCOUNTER — Ambulatory Visit (INDEPENDENT_AMBULATORY_CARE_PROVIDER_SITE_OTHER): Payer: Medicare Other | Admitting: Allergy and Immunology

## 2015-09-19 VITALS — BP 126/70 | HR 65 | Temp 97.7°F | Resp 16 | Ht 65.95 in | Wt 243.0 lb

## 2015-09-19 DIAGNOSIS — L309 Dermatitis, unspecified: Secondary | ICD-10-CM

## 2015-09-19 DIAGNOSIS — E039 Hypothyroidism, unspecified: Secondary | ICD-10-CM | POA: Diagnosis not present

## 2015-09-19 DIAGNOSIS — L500 Allergic urticaria: Secondary | ICD-10-CM

## 2015-09-19 MED ORDER — LEVOCETIRIZINE DIHYDROCHLORIDE 5 MG PO TABS: 5.0000 mg | ORAL_TABLET | Freq: Every evening | ORAL | Status: DC

## 2015-09-19 NOTE — Patient Instructions (Addendum)
Dermatitis Unclear etiology. This does not appear to be contact dermatitis or atopic dermatitis. Her history and physical examination are minimally suggestive of urticaria but we will check urticaria screening labs. Food allergen and aeroallergen skin tests were negative today despite a positive histamine control.  The following labs have been ordered: FCeRI antibody, TSH, anti-thyroglobulin antibody, thyroid peroxidase antibody, tryptase, urea breath test, and galactose-alpha-1,3-galactose IgE level.  The patient will be called with further recommendations after lab results have returned.  A prescription has been provided for levocetirizine 5 mg daily as needed Instructions have been provided and discussed for H1/H2 receptor blockade with step-wise increase/decrease to find lowest effective dose.  Should significant symptoms recur or new symptoms occur, a journal is to be kept recording any foods eaten, beverages consumed, medications taken, activities performed, and environmental conditions within a 6 hour time period prior to the onset of symptoms. For any symptoms concerning for anaphylaxis, 911 is to be called immediately.   If symptoms persist or progress, further dermatology evaluation may be warranted.    When lab results have returned the patient will be called with further recommendations and follow up instructions.  Pruritus/Urticaria  . Levocetirizine (Xyzal) 5 mg twice a day and ranitidine (Zantac) 150 mg twice a day. If no symptoms for 7-14 days then decrease to. . Levocetirizine (Xyzal) 5 mg twice a day and ranitidine (Zantac) 150 mg once a day.  If no symptoms for 7-14 days then decrease to. . Levocetirizine (Xyzal) 5 mg twice a day.  If no symptoms for 7-14 days then decrease to. . Levocetirizine (Xyzal) 5 mg once a day.  May use Benadryl (diphenhydramine) as needed for breakthrough symptoms       If symptoms return, then step up dosage

## 2015-09-19 NOTE — Progress Notes (Addendum)
New Patient Note  RE: Alicia [name] MRN: [id number] DOB: May 12, 1945 Date of Office Visit: 09/19/2015  Referring provider: Caryl Bis, MD Primary care provider: Gar Ponto, MD  Chief Complaint: Rash; Pruritus; and Urticaria   History of present illness: HPI Comments: Alicia [name] is a 70 y.o. female presenting today for consultation of rash.  She reports that approximately 3 months ago she developed a pruritic rash which started on her right arm then became evident on her left arm, abdomen, chest, and back.  The rash is described as intensely pruritic and starting out as a "tiny little bump" the develops into a larger red, itchy, bump.  She does not experience concomitant cardiopulmonary or GI symptoms.  She states that she has seen the dermatologist, Dr. Tarri Glenn, and has had several of the lesions "burned off."  A shave biopsy of a lesion was consistent with urticaria.  She has been under increased emotional stress due to her husband's health related issues. She has tried loratadine 10 mg twice a day, ranitidine 150 mg twice a day, and escitalopram 10 mg daily with moderate symptom reduction.  She stopped these medications several days ago in anticipation of today's skin testing and did notice increased pruritus.  No specific medication, food, or environmental triggers have been identified. However, she states that she had several iodine injections in conjunction with knee injections for osteoarthritis at the [facility] clinic and wonders if she may have an undiagnosed shellfish allergy. She has eaten oysters without symptoms but is uncertain about shrimp, crab, or lobster because she doesn't eat these foods based on preference rather than previous adverse symptoms.    Assessment and plan: Dermatitis Unclear etiology. This does not appear to be contact dermatitis or atopic dermatitis. Her history and physical examination are somewhat atypical for urticaria but biopsy was consistent with  urticaria but we will check urticaria screening labs. Food allergen and aeroallergen skin tests were negative today despite a positive histamine control.  The following labs have been ordered: FCeRI antibody, TSH, anti-thyroglobulin antibody, thyroid peroxidase antibody, tryptase, urea breath test, and galactose-alpha-1,3-galactose IgE level.  The patient will be called with further recommendations after lab results have returned.  A prescription has been provided for levocetirizine 5 mg daily as needed Instructions have been provided and discussed for H1/H2 receptor blockade with step-wise increase/decrease to find lowest effective dose.  Should significant symptoms recur or new symptoms occur, a journal is to be kept recording any foods eaten, beverages consumed, medications taken, activities performed, and environmental conditions within a 6 hour time period prior to the onset of symptoms. For any symptoms concerning for anaphylaxis, 911 is to be called immediately.   If symptoms persist or progress, further dermatology evaluation may be warranted.    Meds ordered this encounter  Medications  . levocetirizine (XYZAL) 5 MG tablet    Sig: Take 1 tablet (5 mg total) by mouth every evening.    Dispense:  30 tablet    Refill:  5    Diagnositics: Environmental skin testing: Negative despite a positive histamine control. Food allergen skin testing: Negative despite a positive histamine control.    Physical examination: Blood pressure 126/70, pulse 65, temperature 97.7 F (36.5 C), temperature source Oral, resp. rate 16, height 5' 5.95" (1.675 m), weight 243 lb (110.224 kg).  General: Alert, interactive, in no acute distress. Lungs: Clear to auscultation without wheezing, rhonchi or rales. CV: Normal S1, S2 without murmurs. Abdomen: Nondistended, nontender. Skin: Scattered, 2 x  2 mm denuded lesions in various degrees of healing on the upper extremities and chest. Extremities:  No  clubbing, cyanosis or edema. Neuro:   Grossly intact.  Review of systems:  Review of Systems  Constitutional: Negative for fever, chills and weight loss.  HENT: Negative for congestion and nosebleeds.   Eyes: Negative for blurred vision.  Respiratory: Negative for cough, hemoptysis, shortness of breath and wheezing.   Cardiovascular: Negative for chest pain.  Gastrointestinal: Negative for diarrhea and constipation.  Genitourinary: Negative for dysuria.  Musculoskeletal: Negative for myalgias and joint pain.  Skin: Positive for itching and rash.  Neurological: Negative for dizziness.  Endo/Heme/Allergies: Negative for environmental allergies. Does not bruise/bleed easily.    Past medical history:  Past Medical History  Diagnosis Date  . Obesity   . Diabetes ([place])   . DJD (degenerative joint disease)   . Hypothyroidism   . Dysrhythmia     SYMPTOMATIC BRADYCARDIA  . 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 Anemia   . Hypertension     off meds at this time  . Wears glasses     Past surgical history:  Past Surgical History  Procedure Laterality Date  . Cholecystectomy    . 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 Shoulder surgery Right 2012  . Breast surgery      biopsy  . Knee surgery      right  . Ankle surgery Right     multiple  . Foot arthrotomy      left  . Colonoscopy    . Eye surgery      both cataracts  . Shoulder arthroscopy with subacromial decompression and open rotator c Right 07/07/2013    Procedure: RIGHT SHOULDER ARTHROSCOPY WITH SUBACROMIAL DECOMPRESSION AND ROTATOR CUFF DEBRIDEMENT/ DISTAL CLAVICLE RESECTION;  Surgeon: [name], MD;  Location: [facility];  Service: Orthopedics;  Laterality: Right;  . Permanent pacemaker insertion N/A 12/15/2012    SJM 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 by Dr Lovena Le for transient AV block    Family  history: Family History  Problem Relation Age of Onset  . Sudden death Brother     age 40s, died while playing raquetball  . Stroke Mother   . Cancer Father     throat  . Allergic rhinitis Neg Hx   . Angioedema Neg Hx   . Asthma Neg Hx   . Atopy Neg Hx   . Eczema Neg Hx   . Immunodeficiency Neg Hx   . Urticaria Neg Hx     Social history: Social History   Social History  . Marital Status: Married    Spouse Name: N/A  . Number of Children: N/A  . Years of Education: N/A   Occupational History  . Not on file.   Social History Main Topics  . Smoking status: Never Smoker   . Smokeless tobacco: Never Used  . Alcohol Use: No  . Drug Use: No  . Sexual Activity: Not on file   Other Topics Concern  . Not on file   Social History Narrative   Retired and lives in [place] with spouse   Environmental History: The patient lives in an 72 year old house with hardwood floors throughout, gas heat, and central air.  She is a nonsmoker without pets.    Medication List       This  list is accurate as of: 09/19/15  5:28 PM.  Always use your most recent med list.               apixaban 5 MG Tabs tablet  Commonly known as:  ELIQUIS  Take 1 tablet (5 mg total) by mouth 2 (two) times daily.     CLARITIN 10 MG tablet  Generic drug:  loratadine  Take 10 mg by mouth 2 (two) times daily. Reported on 09/19/2015     diltiazem 30 MG tablet  Commonly known as:  CARDIZEM  Take 1 tablet (30 mg total) by mouth every 6 (six) hours as needed.     escitalopram 10 MG tablet  Commonly known as:  LEXAPRO  Take 1 tablet by mouth daily.     flecainide 100 MG tablet  Commonly known as:  TAMBOCOR  Take 3 tablets (300 mg total) by mouth once. AS NEEDED     ICAPS Caps  Take 1 capsule by mouth daily.     insulin NPH-regular Human (70-30) 100 UNIT/ML injection  Commonly known as:  NOVOLIN 70/30  Inject 48 Units into the skin 2 (two) times daily with a meal.     INSULIN SYRINGE 1CC/29G 29G X  1/2" 1 ML Misc     levocetirizine 5 MG tablet  Commonly known as:  XYZAL  Take 1 tablet (5 mg total) by mouth every evening.     levothyroxine 100 MCG tablet  Commonly known as:  SYNTHROID, LEVOTHROID  Take 100 mcg by mouth daily before breakfast.     lisinopril-hydrochlorothiazide 10-12.5 MG tablet  Commonly known as:  PRINZIDE,ZESTORETIC  Take 1 tablet by mouth daily.     mometasone 0.1 % ointment  Commonly known as:  ELOCON     PROBIOTIC DAILY PO  Take 1 tablet by mouth daily.     ranitidine 75 MG tablet  Commonly known as:  ZANTAC  Take 150 mg by mouth 2 (two) times daily. Reported on 09/19/2015     SYSTANE 0.4-0.3 % Soln  Generic drug:  Polyethyl Glycol-Propyl Glycol  Place 1 drop into both eyes as needed (dry eyes).        Known medication allergies: Allergies  Allergen Reactions  . Ace Inhibitors Cough  . Cardizem [Diltiazem Hcl] Other (See Comments)    headache  . Statins Other (See Comments)    myalgias  . Sulfa Antibiotics Hives and Swelling  . Tape     Paper tape is okay  . Cortisone Rash    flushing  . Latex Rash    Sensitive not allergic    I appreciate the opportunity to take part in this Marny's care. Please do not hesitate to contact me with questions.  Sincerely,   R. Edgar Frisk, MD

## 2015-09-19 NOTE — Assessment & Plan Note (Addendum)
Unclear etiology. This does not appear to be contact dermatitis or atopic dermatitis. Her history and physical examination are somewhat atypical for urticaria but biopsy was consistent with urticaria but we will check urticaria screening labs. Food allergen and aeroallergen skin tests were negative today despite a positive histamine control.  The following labs have been ordered: FCeRI antibody, TSH, anti-thyroglobulin antibody, thyroid peroxidase antibody, tryptase, urea breath test, and galactose-alpha-1,3-galactose IgE level.  The patient will be called with further recommendations after lab results have returned.  A prescription has been provided for levocetirizine 5 mg daily as needed Instructions have been provided and discussed for H1/H2 receptor blockade with step-wise increase/decrease to find lowest effective dose.  Should significant symptoms recur or new symptoms occur, a journal is to be kept recording any foods eaten, beverages consumed, medications taken, activities performed, and environmental conditions within a 6 hour time period prior to the onset of symptoms. For any symptoms concerning for anaphylaxis, 911 is to be called immediately.   If symptoms persist or progress, further dermatology evaluation may be warranted.

## 2015-09-20 LAB — TRYPTASE: Tryptase: 9.8 ug/L (ref <11)

## 2015-09-20 LAB — H. PYLORI BREATH TEST: H. pylori Breath Test: NOT DETECTED

## 2015-09-22 LAB — ALPHA-GAL PANEL
Allergen, Mutton, f88: <0.1 kU/L
Allergen, Pork, f26: <0.1 kU/L
Beef: <0.1 kU/L
Galactose-alpha-1,3-galactose IgE: <0.1 kU/L (ref <0.35)

## 2015-09-26 LAB — CP CHRONIC URTICARIA INDEX PANEL
Histamine Release: <16 % (ref <16)
TSH: 2.8 mIU/L
Thyroglobulin Ab: 3 IU/mL — ABNORMAL HIGH (ref <2)
Thyroperoxidase Ab SerPl-aCnc: 3 IU/mL (ref <9)

## 2015-09-27 ENCOUNTER — Telehealth: Payer: Self-pay | Admitting: Allergy and Immunology

## 2015-09-27 NOTE — Telephone Encounter (Signed)
SEE NOTE ON RESULTS

## 2015-09-27 NOTE — Telephone Encounter (Signed)
She called asking to speak with a nurse for Dr. Verlin Fester. She was in last Monday and was sent to have labs drawn. She received a message saying that her results are in. Can you please call her with those.

## 2015-10-04 ENCOUNTER — Telehealth: Payer: Self-pay | Admitting: Allergy and Immunology

## 2015-10-04 NOTE — Telephone Encounter (Signed)
Please advise 

## 2015-10-04 NOTE — Telephone Encounter (Signed)
Spoke with pt, and she will call dr Perlie Gold and make an appointment

## 2015-10-04 NOTE — Telephone Encounter (Signed)
Further evaluation with dermatologist is recommended.

## 2015-10-04 NOTE — Telephone Encounter (Signed)
She was in a couple weeks ago and have used the medications that he gave her but she is still having breakouts. She wants to know what she needs to do now. Does she need to come back to him or go to her dermatologist?

## 2015-10-05 DIAGNOSIS — I10 Essential (primary) hypertension: Secondary | ICD-10-CM | POA: Diagnosis not present

## 2015-10-05 DIAGNOSIS — E1165 Type 2 diabetes mellitus with hyperglycemia: Secondary | ICD-10-CM | POA: Diagnosis not present

## 2015-10-05 DIAGNOSIS — E039 Hypothyroidism, unspecified: Secondary | ICD-10-CM | POA: Diagnosis not present

## 2015-10-05 DIAGNOSIS — E782 Mixed hyperlipidemia: Secondary | ICD-10-CM | POA: Diagnosis not present

## 2015-10-05 DIAGNOSIS — K219 Gastro-esophageal reflux disease without esophagitis: Secondary | ICD-10-CM | POA: Diagnosis not present

## 2015-10-07 DIAGNOSIS — E039 Hypothyroidism, unspecified: Secondary | ICD-10-CM | POA: Diagnosis not present

## 2015-10-07 DIAGNOSIS — I10 Essential (primary) hypertension: Secondary | ICD-10-CM | POA: Diagnosis not present

## 2015-10-07 DIAGNOSIS — E6609 Other obesity due to excess calories: Secondary | ICD-10-CM | POA: Diagnosis not present

## 2015-10-07 DIAGNOSIS — E1165 Type 2 diabetes mellitus with hyperglycemia: Secondary | ICD-10-CM | POA: Diagnosis not present

## 2015-10-07 DIAGNOSIS — G4733 Obstructive sleep apnea (adult) (pediatric): Secondary | ICD-10-CM | POA: Diagnosis not present

## 2015-10-07 DIAGNOSIS — E8881 Metabolic syndrome: Secondary | ICD-10-CM | POA: Diagnosis not present

## 2015-10-07 DIAGNOSIS — E782 Mixed hyperlipidemia: Secondary | ICD-10-CM | POA: Diagnosis not present

## 2015-10-07 DIAGNOSIS — K219 Gastro-esophageal reflux disease without esophagitis: Secondary | ICD-10-CM | POA: Diagnosis not present

## 2015-10-11 DIAGNOSIS — L508 Other urticaria: Secondary | ICD-10-CM | POA: Diagnosis not present

## 2015-10-18 DIAGNOSIS — E119 Type 2 diabetes mellitus without complications: Secondary | ICD-10-CM | POA: Diagnosis not present

## 2015-10-18 DIAGNOSIS — E039 Hypothyroidism, unspecified: Secondary | ICD-10-CM | POA: Diagnosis not present

## 2015-11-03 DIAGNOSIS — L500 Allergic urticaria: Secondary | ICD-10-CM | POA: Diagnosis not present

## 2015-11-10 DIAGNOSIS — Z79899 Other long term (current) drug therapy: Secondary | ICD-10-CM | POA: Diagnosis not present

## 2015-11-10 DIAGNOSIS — L509 Urticaria, unspecified: Secondary | ICD-10-CM | POA: Diagnosis not present

## 2015-11-10 DIAGNOSIS — L309 Dermatitis, unspecified: Secondary | ICD-10-CM | POA: Diagnosis not present

## 2015-11-29 DIAGNOSIS — L600 Ingrowing nail: Secondary | ICD-10-CM | POA: Diagnosis not present

## 2015-11-29 DIAGNOSIS — E1140 Type 2 diabetes mellitus with diabetic neuropathy, unspecified: Secondary | ICD-10-CM | POA: Diagnosis not present

## 2015-11-29 DIAGNOSIS — M79671 Pain in right foot: Secondary | ICD-10-CM | POA: Diagnosis not present

## 2015-11-29 DIAGNOSIS — E1151 Type 2 diabetes mellitus with diabetic peripheral angiopathy without gangrene: Secondary | ICD-10-CM | POA: Diagnosis not present

## 2015-12-01 DIAGNOSIS — Z79899 Other long term (current) drug therapy: Secondary | ICD-10-CM | POA: Diagnosis not present

## 2015-12-01 DIAGNOSIS — L309 Dermatitis, unspecified: Secondary | ICD-10-CM | POA: Diagnosis not present

## 2015-12-06 DIAGNOSIS — L500 Allergic urticaria: Secondary | ICD-10-CM | POA: Diagnosis not present

## 2015-12-06 DIAGNOSIS — Z6839 Body mass index (BMI) 39.0-39.9, adult: Secondary | ICD-10-CM | POA: Diagnosis not present

## 2015-12-12 ENCOUNTER — Ambulatory Visit (INDEPENDENT_AMBULATORY_CARE_PROVIDER_SITE_OTHER): Payer: Medicare Other | Admitting: *Deleted

## 2015-12-12 DIAGNOSIS — I441 Atrioventricular block, second degree: Secondary | ICD-10-CM | POA: Diagnosis not present

## 2015-12-12 NOTE — Progress Notes (Signed)
Remote pacemaker transmission.   

## 2015-12-14 ENCOUNTER — Encounter: Payer: Self-pay | Admitting: Cardiology

## 2015-12-14 DIAGNOSIS — H02833 Dermatochalasis of right eye, unspecified eyelid: Secondary | ICD-10-CM | POA: Diagnosis not present

## 2015-12-14 DIAGNOSIS — Z961 Presence of intraocular lens: Secondary | ICD-10-CM | POA: Diagnosis not present

## 2015-12-14 DIAGNOSIS — H353112 Nonexudative age-related macular degeneration, right eye, intermediate dry stage: Secondary | ICD-10-CM | POA: Diagnosis not present

## 2015-12-14 DIAGNOSIS — E113292 Type 2 diabetes mellitus with mild nonproliferative diabetic retinopathy without macular edema, left eye: Secondary | ICD-10-CM | POA: Diagnosis not present

## 2015-12-14 DIAGNOSIS — H353122 Nonexudative age-related macular degeneration, left eye, intermediate dry stage: Secondary | ICD-10-CM | POA: Diagnosis not present

## 2015-12-14 DIAGNOSIS — E113291 Type 2 diabetes mellitus with mild nonproliferative diabetic retinopathy without macular edema, right eye: Secondary | ICD-10-CM | POA: Diagnosis not present

## 2015-12-15 LAB — CUP PACEART REMOTE DEVICE CHECK
Battery Voltage: 3.01 V
Brady Statistic AP VP Percent: 1 %
Brady Statistic AP VS Percent: 27 %
Brady Statistic AS VP Percent: 1 %
Brady Statistic AS VS Percent: 72 %
Date Time Interrogation Session: 20170807060015
Implantable Lead Implant Date: 20140811
Implantable Lead Implant Date: 20140811
Implantable Lead Location: 753860
Lead Channel Impedance Value: 380 Ohm
Lead Channel Pacing Threshold Amplitude: 0.5 V
Lead Channel Pacing Threshold Amplitude: 0.5 V
Lead Channel Pacing Threshold Pulse Width: 0.4 ms
Lead Channel Sensing Intrinsic Amplitude: 12 mV
Lead Channel Setting Pacing Amplitude: 0.75 V
Lead Channel Setting Pacing Amplitude: 1.5 V
Lead Channel Setting Pacing Pulse Width: 0.4 ms
MDC IDC LEAD LOCATION: 753859
MDC IDC MSMT BATTERY REMAINING LONGEVITY: 136 mo
MDC IDC MSMT BATTERY REMAINING PERCENTAGE: 95.5 %
MDC IDC MSMT LEADCHNL RA IMPEDANCE VALUE: 430 Ohm
MDC IDC MSMT LEADCHNL RA PACING THRESHOLD PULSEWIDTH: 0.4 ms
MDC IDC MSMT LEADCHNL RA SENSING INTR AMPL: 3.7 mV
MDC IDC SET LEADCHNL RV SENSING SENSITIVITY: 2 mV
MDC IDC STAT BRADY RA PERCENT PACED: 28 %
MDC IDC STAT BRADY RV PERCENT PACED: 1 %
Pulse Gen Serial Number: 7528878

## 2015-12-26 DIAGNOSIS — L03019 Cellulitis of unspecified finger: Secondary | ICD-10-CM | POA: Diagnosis not present

## 2015-12-26 DIAGNOSIS — Z6841 Body Mass Index (BMI) 40.0 and over, adult: Secondary | ICD-10-CM | POA: Diagnosis not present

## 2016-01-23 DIAGNOSIS — E6609 Other obesity due to excess calories: Secondary | ICD-10-CM | POA: Diagnosis not present

## 2016-01-23 DIAGNOSIS — I10 Essential (primary) hypertension: Secondary | ICD-10-CM | POA: Diagnosis not present

## 2016-01-23 DIAGNOSIS — E1165 Type 2 diabetes mellitus with hyperglycemia: Secondary | ICD-10-CM | POA: Diagnosis not present

## 2016-01-23 DIAGNOSIS — E782 Mixed hyperlipidemia: Secondary | ICD-10-CM | POA: Diagnosis not present

## 2016-01-25 DIAGNOSIS — E1165 Type 2 diabetes mellitus with hyperglycemia: Secondary | ICD-10-CM | POA: Diagnosis not present

## 2016-01-25 DIAGNOSIS — Z1212 Encounter for screening for malignant neoplasm of rectum: Secondary | ICD-10-CM | POA: Diagnosis not present

## 2016-01-25 DIAGNOSIS — E8881 Metabolic syndrome: Secondary | ICD-10-CM | POA: Diagnosis not present

## 2016-01-25 DIAGNOSIS — E782 Mixed hyperlipidemia: Secondary | ICD-10-CM | POA: Diagnosis not present

## 2016-01-25 DIAGNOSIS — Z6839 Body mass index (BMI) 39.0-39.9, adult: Secondary | ICD-10-CM | POA: Diagnosis not present

## 2016-01-25 DIAGNOSIS — Z23 Encounter for immunization: Secondary | ICD-10-CM | POA: Diagnosis not present

## 2016-01-25 DIAGNOSIS — Z0001 Encounter for general adult medical examination with abnormal findings: Secondary | ICD-10-CM | POA: Diagnosis not present

## 2016-01-25 DIAGNOSIS — E039 Hypothyroidism, unspecified: Secondary | ICD-10-CM | POA: Diagnosis not present

## 2016-02-13 DIAGNOSIS — M179 Osteoarthritis of knee, unspecified: Secondary | ICD-10-CM | POA: Diagnosis not present

## 2016-02-13 DIAGNOSIS — S83241A Other tear of medial meniscus, current injury, right knee, initial encounter: Secondary | ICD-10-CM | POA: Diagnosis not present

## 2016-02-14 DIAGNOSIS — L600 Ingrowing nail: Secondary | ICD-10-CM | POA: Diagnosis not present

## 2016-02-14 DIAGNOSIS — E1140 Type 2 diabetes mellitus with diabetic neuropathy, unspecified: Secondary | ICD-10-CM | POA: Diagnosis not present

## 2016-02-14 DIAGNOSIS — E1151 Type 2 diabetes mellitus with diabetic peripheral angiopathy without gangrene: Secondary | ICD-10-CM | POA: Diagnosis not present

## 2016-02-14 DIAGNOSIS — M79671 Pain in right foot: Secondary | ICD-10-CM | POA: Diagnosis not present

## 2016-02-21 ENCOUNTER — Telehealth: Payer: Self-pay | Admitting: Allergy and Immunology

## 2016-02-21 NOTE — Telephone Encounter (Signed)
In reference to a TSH test done at the lab and it has been denied. MCR says that the claim needs to be resent with more information as to why it was needed. Can you look at this and call her back.

## 2016-03-12 ENCOUNTER — Ambulatory Visit (INDEPENDENT_AMBULATORY_CARE_PROVIDER_SITE_OTHER): Payer: Medicare Other | Admitting: *Deleted

## 2016-03-12 DIAGNOSIS — M179 Osteoarthritis of knee, unspecified: Secondary | ICD-10-CM | POA: Diagnosis not present

## 2016-03-12 DIAGNOSIS — I441 Atrioventricular block, second degree: Secondary | ICD-10-CM | POA: Diagnosis not present

## 2016-03-12 DIAGNOSIS — S83241A Other tear of medial meniscus, current injury, right knee, initial encounter: Secondary | ICD-10-CM | POA: Diagnosis not present

## 2016-03-13 NOTE — Progress Notes (Signed)
Remote pacemaker transmission.   

## 2016-03-14 ENCOUNTER — Encounter: Payer: Self-pay | Admitting: Cardiology

## 2016-03-20 DIAGNOSIS — Z888 Allergy status to other drugs, medicaments and biological substances status: Secondary | ICD-10-CM | POA: Diagnosis not present

## 2016-03-20 DIAGNOSIS — K219 Gastro-esophageal reflux disease without esophagitis: Secondary | ICD-10-CM | POA: Diagnosis not present

## 2016-03-20 DIAGNOSIS — E119 Type 2 diabetes mellitus without complications: Secondary | ICD-10-CM | POA: Diagnosis not present

## 2016-03-20 DIAGNOSIS — Z950 Presence of cardiac pacemaker: Secondary | ICD-10-CM | POA: Diagnosis not present

## 2016-03-20 DIAGNOSIS — Z6839 Body mass index (BMI) 39.0-39.9, adult: Secondary | ICD-10-CM | POA: Diagnosis not present

## 2016-03-20 DIAGNOSIS — M8580 Other specified disorders of bone density and structure, unspecified site: Secondary | ICD-10-CM | POA: Diagnosis not present

## 2016-03-20 DIAGNOSIS — S83281A Other tear of lateral meniscus, current injury, right knee, initial encounter: Secondary | ICD-10-CM | POA: Diagnosis not present

## 2016-03-20 DIAGNOSIS — M1711 Unilateral primary osteoarthritis, right knee: Secondary | ICD-10-CM | POA: Diagnosis not present

## 2016-03-20 DIAGNOSIS — Z7982 Long term (current) use of aspirin: Secondary | ICD-10-CM | POA: Diagnosis not present

## 2016-03-20 DIAGNOSIS — E669 Obesity, unspecified: Secondary | ICD-10-CM | POA: Diagnosis not present

## 2016-03-20 DIAGNOSIS — M2241 Chondromalacia patellae, right knee: Secondary | ICD-10-CM | POA: Diagnosis not present

## 2016-03-20 DIAGNOSIS — Z79899 Other long term (current) drug therapy: Secondary | ICD-10-CM | POA: Diagnosis not present

## 2016-03-20 DIAGNOSIS — Z809 Family history of malignant neoplasm, unspecified: Secondary | ICD-10-CM | POA: Diagnosis not present

## 2016-03-20 DIAGNOSIS — Z823 Family history of stroke: Secondary | ICD-10-CM | POA: Diagnosis not present

## 2016-03-20 DIAGNOSIS — Z882 Allergy status to sulfonamides status: Secondary | ICD-10-CM | POA: Diagnosis not present

## 2016-03-20 DIAGNOSIS — S83241A Other tear of medial meniscus, current injury, right knee, initial encounter: Secondary | ICD-10-CM | POA: Diagnosis not present

## 2016-03-20 DIAGNOSIS — Z794 Long term (current) use of insulin: Secondary | ICD-10-CM | POA: Diagnosis not present

## 2016-03-20 DIAGNOSIS — I10 Essential (primary) hypertension: Secondary | ICD-10-CM | POA: Diagnosis not present

## 2016-03-20 DIAGNOSIS — Z7902 Long term (current) use of antithrombotics/antiplatelets: Secondary | ICD-10-CM | POA: Diagnosis not present

## 2016-03-20 DIAGNOSIS — M25761 Osteophyte, right knee: Secondary | ICD-10-CM | POA: Diagnosis not present

## 2016-03-20 DIAGNOSIS — S83281S Other tear of lateral meniscus, current injury, right knee, sequela: Secondary | ICD-10-CM | POA: Diagnosis not present

## 2016-03-20 DIAGNOSIS — E039 Hypothyroidism, unspecified: Secondary | ICD-10-CM | POA: Diagnosis not present

## 2016-03-25 LAB — CUP PACEART REMOTE DEVICE CHECK
Battery Remaining Percentage: 95.5 %
Battery Voltage: 3.01 V
Brady Statistic RA Percent Paced: 20 %
Date Time Interrogation Session: 20171106164129
Implantable Lead Implant Date: 20140811
Implantable Lead Location: 753859
Implantable Pulse Generator Implant Date: 20140811
Lead Channel Impedance Value: 410 Ohm
Lead Channel Pacing Threshold Amplitude: 0.625 V
Lead Channel Pacing Threshold Pulse Width: 0.4 ms
Lead Channel Sensing Intrinsic Amplitude: 3.2 mV
MDC IDC LEAD IMPLANT DT: 20140811
MDC IDC LEAD LOCATION: 753860
MDC IDC MSMT BATTERY REMAINING LONGEVITY: 136 mo
MDC IDC MSMT LEADCHNL RA IMPEDANCE VALUE: 440 Ohm
MDC IDC MSMT LEADCHNL RV PACING THRESHOLD AMPLITUDE: 0.625 V
MDC IDC MSMT LEADCHNL RV PACING THRESHOLD PULSEWIDTH: 0.4 ms
MDC IDC MSMT LEADCHNL RV SENSING INTR AMPL: 12 mV
MDC IDC SET LEADCHNL RA PACING AMPLITUDE: 1.625
MDC IDC SET LEADCHNL RV PACING AMPLITUDE: 0.875
MDC IDC SET LEADCHNL RV PACING PULSEWIDTH: 0.4 ms
MDC IDC SET LEADCHNL RV SENSING SENSITIVITY: 2 mV
MDC IDC STAT BRADY AP VP PERCENT: 1 %
MDC IDC STAT BRADY AP VS PERCENT: 20 %
MDC IDC STAT BRADY AS VP PERCENT: 1 %
MDC IDC STAT BRADY AS VS PERCENT: 79 %
MDC IDC STAT BRADY RV PERCENT PACED: 1 %
Pulse Gen Model: 2240
Pulse Gen Serial Number: 7528878

## 2016-04-01 DIAGNOSIS — M25561 Pain in right knee: Secondary | ICD-10-CM | POA: Diagnosis not present

## 2016-04-01 DIAGNOSIS — Z794 Long term (current) use of insulin: Secondary | ICD-10-CM | POA: Diagnosis not present

## 2016-04-01 DIAGNOSIS — I4891 Unspecified atrial fibrillation: Secondary | ICD-10-CM | POA: Diagnosis not present

## 2016-04-01 DIAGNOSIS — Z7902 Long term (current) use of antithrombotics/antiplatelets: Secondary | ICD-10-CM | POA: Diagnosis not present

## 2016-04-01 DIAGNOSIS — I10 Essential (primary) hypertension: Secondary | ICD-10-CM | POA: Diagnosis not present

## 2016-04-01 DIAGNOSIS — E119 Type 2 diabetes mellitus without complications: Secondary | ICD-10-CM | POA: Diagnosis not present

## 2016-04-01 DIAGNOSIS — Z79899 Other long term (current) drug therapy: Secondary | ICD-10-CM | POA: Diagnosis not present

## 2016-04-01 DIAGNOSIS — Z9889 Other specified postprocedural states: Secondary | ICD-10-CM | POA: Diagnosis not present

## 2016-04-01 DIAGNOSIS — M25461 Effusion, right knee: Secondary | ICD-10-CM | POA: Diagnosis not present

## 2016-04-01 DIAGNOSIS — R52 Pain, unspecified: Secondary | ICD-10-CM | POA: Diagnosis not present

## 2016-04-05 DIAGNOSIS — Z882 Allergy status to sulfonamides status: Secondary | ICD-10-CM | POA: Diagnosis not present

## 2016-04-05 DIAGNOSIS — I482 Chronic atrial fibrillation: Secondary | ICD-10-CM | POA: Diagnosis not present

## 2016-04-05 DIAGNOSIS — L509 Urticaria, unspecified: Secondary | ICD-10-CM | POA: Diagnosis not present

## 2016-04-05 DIAGNOSIS — E1165 Type 2 diabetes mellitus with hyperglycemia: Secondary | ICD-10-CM | POA: Diagnosis not present

## 2016-04-05 DIAGNOSIS — M1711 Unilateral primary osteoarthritis, right knee: Secondary | ICD-10-CM | POA: Diagnosis not present

## 2016-04-05 DIAGNOSIS — Z794 Long term (current) use of insulin: Secondary | ICD-10-CM | POA: Diagnosis not present

## 2016-04-05 DIAGNOSIS — E039 Hypothyroidism, unspecified: Secondary | ICD-10-CM | POA: Diagnosis not present

## 2016-04-05 DIAGNOSIS — Z950 Presence of cardiac pacemaker: Secondary | ICD-10-CM | POA: Diagnosis not present

## 2016-04-05 DIAGNOSIS — M25561 Pain in right knee: Secondary | ICD-10-CM | POA: Diagnosis not present

## 2016-04-05 DIAGNOSIS — Z9889 Other specified postprocedural states: Secondary | ICD-10-CM | POA: Diagnosis not present

## 2016-04-05 DIAGNOSIS — Z888 Allergy status to other drugs, medicaments and biological substances status: Secondary | ICD-10-CM | POA: Diagnosis not present

## 2016-04-05 DIAGNOSIS — M25562 Pain in left knee: Secondary | ICD-10-CM | POA: Diagnosis not present

## 2016-04-05 DIAGNOSIS — M25061 Hemarthrosis, right knee: Secondary | ICD-10-CM | POA: Diagnosis not present

## 2016-04-05 DIAGNOSIS — R52 Pain, unspecified: Secondary | ICD-10-CM | POA: Diagnosis not present

## 2016-04-05 DIAGNOSIS — Z91040 Latex allergy status: Secondary | ICD-10-CM | POA: Diagnosis not present

## 2016-04-05 DIAGNOSIS — E1142 Type 2 diabetes mellitus with diabetic polyneuropathy: Secondary | ICD-10-CM | POA: Diagnosis not present

## 2016-04-05 DIAGNOSIS — I10 Essential (primary) hypertension: Secondary | ICD-10-CM | POA: Diagnosis not present

## 2016-04-05 DIAGNOSIS — Z6838 Body mass index (BMI) 38.0-38.9, adult: Secondary | ICD-10-CM | POA: Diagnosis not present

## 2016-04-10 DIAGNOSIS — L309 Dermatitis, unspecified: Secondary | ICD-10-CM | POA: Diagnosis not present

## 2016-04-24 DIAGNOSIS — M79671 Pain in right foot: Secondary | ICD-10-CM | POA: Diagnosis not present

## 2016-04-24 DIAGNOSIS — L600 Ingrowing nail: Secondary | ICD-10-CM | POA: Diagnosis not present

## 2016-04-24 DIAGNOSIS — E1151 Type 2 diabetes mellitus with diabetic peripheral angiopathy without gangrene: Secondary | ICD-10-CM | POA: Diagnosis not present

## 2016-04-24 DIAGNOSIS — E1140 Type 2 diabetes mellitus with diabetic neuropathy, unspecified: Secondary | ICD-10-CM | POA: Diagnosis not present

## 2016-05-07 DIAGNOSIS — Z6838 Body mass index (BMI) 38.0-38.9, adult: Secondary | ICD-10-CM | POA: Diagnosis not present

## 2016-05-07 DIAGNOSIS — M25061 Hemarthrosis, right knee: Secondary | ICD-10-CM | POA: Diagnosis not present

## 2016-06-07 DIAGNOSIS — E039 Hypothyroidism, unspecified: Secondary | ICD-10-CM | POA: Diagnosis not present

## 2016-06-07 DIAGNOSIS — Z9189 Other specified personal risk factors, not elsewhere classified: Secondary | ICD-10-CM | POA: Diagnosis not present

## 2016-06-07 DIAGNOSIS — I10 Essential (primary) hypertension: Secondary | ICD-10-CM | POA: Diagnosis not present

## 2016-06-07 DIAGNOSIS — E8881 Metabolic syndrome: Secondary | ICD-10-CM | POA: Diagnosis not present

## 2016-06-07 DIAGNOSIS — E782 Mixed hyperlipidemia: Secondary | ICD-10-CM | POA: Diagnosis not present

## 2016-06-07 DIAGNOSIS — E1165 Type 2 diabetes mellitus with hyperglycemia: Secondary | ICD-10-CM | POA: Diagnosis not present

## 2016-06-11 ENCOUNTER — Ambulatory Visit (INDEPENDENT_AMBULATORY_CARE_PROVIDER_SITE_OTHER): Payer: Medicare Other | Admitting: *Deleted

## 2016-06-11 DIAGNOSIS — I441 Atrioventricular block, second degree: Secondary | ICD-10-CM | POA: Diagnosis not present

## 2016-06-11 DIAGNOSIS — E782 Mixed hyperlipidemia: Secondary | ICD-10-CM | POA: Diagnosis not present

## 2016-06-11 DIAGNOSIS — E6609 Other obesity due to excess calories: Secondary | ICD-10-CM | POA: Diagnosis not present

## 2016-06-11 DIAGNOSIS — E8881 Metabolic syndrome: Secondary | ICD-10-CM | POA: Diagnosis not present

## 2016-06-11 DIAGNOSIS — E1165 Type 2 diabetes mellitus with hyperglycemia: Secondary | ICD-10-CM | POA: Diagnosis not present

## 2016-06-11 DIAGNOSIS — Z23 Encounter for immunization: Secondary | ICD-10-CM | POA: Diagnosis not present

## 2016-06-11 DIAGNOSIS — E039 Hypothyroidism, unspecified: Secondary | ICD-10-CM | POA: Diagnosis not present

## 2016-06-11 DIAGNOSIS — Z9189 Other specified personal risk factors, not elsewhere classified: Secondary | ICD-10-CM | POA: Diagnosis not present

## 2016-06-11 DIAGNOSIS — Z6838 Body mass index (BMI) 38.0-38.9, adult: Secondary | ICD-10-CM | POA: Diagnosis not present

## 2016-06-12 DIAGNOSIS — Z7901 Long term (current) use of anticoagulants: Secondary | ICD-10-CM | POA: Diagnosis not present

## 2016-06-12 DIAGNOSIS — Z79899 Other long term (current) drug therapy: Secondary | ICD-10-CM | POA: Diagnosis not present

## 2016-06-12 DIAGNOSIS — L2089 Other atopic dermatitis: Secondary | ICD-10-CM | POA: Diagnosis not present

## 2016-06-12 LAB — CUP PACEART REMOTE DEVICE CHECK
Battery Remaining Longevity: 136 mo
Brady Statistic AP VP Percent: 1 %
Brady Statistic AP VS Percent: 20 %
Brady Statistic AS VP Percent: 1 %
Brady Statistic RV Percent Paced: 1 %
Implantable Lead Implant Date: 20140811
Implantable Lead Location: 753860
Lead Channel Impedance Value: 430 Ohm
Lead Channel Pacing Threshold Amplitude: 0.625 V
Lead Channel Sensing Intrinsic Amplitude: 12 mV
Lead Channel Setting Pacing Amplitude: 1.5 V
Lead Channel Setting Pacing Pulse Width: 0.4 ms
Lead Channel Setting Sensing Sensitivity: 2 mV
MDC IDC LEAD IMPLANT DT: 20140811
MDC IDC LEAD LOCATION: 753859
MDC IDC MSMT BATTERY REMAINING PERCENTAGE: 95.5 %
MDC IDC MSMT BATTERY VOLTAGE: 3.01 V
MDC IDC MSMT LEADCHNL RA PACING THRESHOLD AMPLITUDE: 0.5 V
MDC IDC MSMT LEADCHNL RA PACING THRESHOLD PULSEWIDTH: 0.4 ms
MDC IDC MSMT LEADCHNL RA SENSING INTR AMPL: 2.9 mV
MDC IDC MSMT LEADCHNL RV IMPEDANCE VALUE: 360 Ohm
MDC IDC MSMT LEADCHNL RV PACING THRESHOLD PULSEWIDTH: 0.4 ms
MDC IDC PG IMPLANT DT: 20140811
MDC IDC PG SERIAL: 7528878
MDC IDC SESS DTM: 20180205072116
MDC IDC SET LEADCHNL RV PACING AMPLITUDE: 0.875
MDC IDC STAT BRADY AS VS PERCENT: 79 %
MDC IDC STAT BRADY RA PERCENT PACED: 20 %

## 2016-06-12 NOTE — Progress Notes (Signed)
Remote pacemaker transmission.   

## 2016-06-13 ENCOUNTER — Encounter: Payer: Self-pay | Admitting: Cardiology

## 2016-07-02 DIAGNOSIS — L539 Erythematous condition, unspecified: Secondary | ICD-10-CM | POA: Diagnosis not present

## 2016-07-02 DIAGNOSIS — B86 Scabies: Secondary | ICD-10-CM | POA: Diagnosis not present

## 2016-07-02 DIAGNOSIS — L509 Urticaria, unspecified: Secondary | ICD-10-CM | POA: Diagnosis not present

## 2016-07-06 DIAGNOSIS — H6123 Impacted cerumen, bilateral: Secondary | ICD-10-CM | POA: Diagnosis not present

## 2016-07-17 DIAGNOSIS — E1140 Type 2 diabetes mellitus with diabetic neuropathy, unspecified: Secondary | ICD-10-CM | POA: Diagnosis not present

## 2016-07-17 DIAGNOSIS — L600 Ingrowing nail: Secondary | ICD-10-CM | POA: Diagnosis not present

## 2016-07-17 DIAGNOSIS — M79671 Pain in right foot: Secondary | ICD-10-CM | POA: Diagnosis not present

## 2016-07-17 DIAGNOSIS — E1151 Type 2 diabetes mellitus with diabetic peripheral angiopathy without gangrene: Secondary | ICD-10-CM | POA: Diagnosis not present

## 2016-07-17 DIAGNOSIS — B351 Tinea unguium: Secondary | ICD-10-CM | POA: Diagnosis not present

## 2016-08-24 DIAGNOSIS — Z23 Encounter for immunization: Secondary | ICD-10-CM | POA: Diagnosis not present

## 2016-09-07 ENCOUNTER — Encounter: Payer: Medicare Other | Admitting: Internal Medicine

## 2016-09-10 ENCOUNTER — Other Ambulatory Visit: Payer: Self-pay | Admitting: Internal Medicine

## 2016-09-10 DIAGNOSIS — J0101 Acute recurrent maxillary sinusitis: Secondary | ICD-10-CM | POA: Diagnosis not present

## 2016-09-10 DIAGNOSIS — Z6838 Body mass index (BMI) 38.0-38.9, adult: Secondary | ICD-10-CM | POA: Diagnosis not present

## 2016-09-10 DIAGNOSIS — J209 Acute bronchitis, unspecified: Secondary | ICD-10-CM | POA: Diagnosis not present

## 2016-10-02 DIAGNOSIS — E1151 Type 2 diabetes mellitus with diabetic peripheral angiopathy without gangrene: Secondary | ICD-10-CM | POA: Diagnosis not present

## 2016-10-02 DIAGNOSIS — L600 Ingrowing nail: Secondary | ICD-10-CM | POA: Diagnosis not present

## 2016-10-02 DIAGNOSIS — M79671 Pain in right foot: Secondary | ICD-10-CM | POA: Diagnosis not present

## 2016-10-02 DIAGNOSIS — E1140 Type 2 diabetes mellitus with diabetic neuropathy, unspecified: Secondary | ICD-10-CM | POA: Diagnosis not present

## 2016-10-12 DIAGNOSIS — I10 Essential (primary) hypertension: Secondary | ICD-10-CM | POA: Diagnosis not present

## 2016-10-12 DIAGNOSIS — M1990 Unspecified osteoarthritis, unspecified site: Secondary | ICD-10-CM | POA: Diagnosis not present

## 2016-10-12 DIAGNOSIS — E1165 Type 2 diabetes mellitus with hyperglycemia: Secondary | ICD-10-CM | POA: Diagnosis not present

## 2016-10-12 DIAGNOSIS — E039 Hypothyroidism, unspecified: Secondary | ICD-10-CM | POA: Diagnosis not present

## 2016-10-12 DIAGNOSIS — E8881 Metabolic syndrome: Secondary | ICD-10-CM | POA: Diagnosis not present

## 2016-10-12 DIAGNOSIS — G4733 Obstructive sleep apnea (adult) (pediatric): Secondary | ICD-10-CM | POA: Diagnosis not present

## 2016-10-12 DIAGNOSIS — E782 Mixed hyperlipidemia: Secondary | ICD-10-CM | POA: Diagnosis not present

## 2016-10-12 DIAGNOSIS — K210 Gastro-esophageal reflux disease with esophagitis: Secondary | ICD-10-CM | POA: Diagnosis not present

## 2016-10-12 DIAGNOSIS — Z9189 Other specified personal risk factors, not elsewhere classified: Secondary | ICD-10-CM | POA: Diagnosis not present

## 2016-10-17 DIAGNOSIS — K219 Gastro-esophageal reflux disease without esophagitis: Secondary | ICD-10-CM | POA: Diagnosis not present

## 2016-10-17 DIAGNOSIS — E1165 Type 2 diabetes mellitus with hyperglycemia: Secondary | ICD-10-CM | POA: Diagnosis not present

## 2016-10-17 DIAGNOSIS — E8881 Metabolic syndrome: Secondary | ICD-10-CM | POA: Diagnosis not present

## 2016-10-17 DIAGNOSIS — I10 Essential (primary) hypertension: Secondary | ICD-10-CM | POA: Diagnosis not present

## 2016-10-17 DIAGNOSIS — Z1389 Encounter for screening for other disorder: Secondary | ICD-10-CM | POA: Diagnosis not present

## 2016-10-17 DIAGNOSIS — Z6837 Body mass index (BMI) 37.0-37.9, adult: Secondary | ICD-10-CM | POA: Diagnosis not present

## 2016-10-17 DIAGNOSIS — E782 Mixed hyperlipidemia: Secondary | ICD-10-CM | POA: Diagnosis not present

## 2016-10-17 DIAGNOSIS — E039 Hypothyroidism, unspecified: Secondary | ICD-10-CM | POA: Diagnosis not present

## 2016-11-06 DIAGNOSIS — L821 Other seborrheic keratosis: Secondary | ICD-10-CM | POA: Diagnosis not present

## 2016-11-06 DIAGNOSIS — Z85828 Personal history of other malignant neoplasm of skin: Secondary | ICD-10-CM | POA: Diagnosis not present

## 2016-11-06 DIAGNOSIS — D1801 Hemangioma of skin and subcutaneous tissue: Secondary | ICD-10-CM | POA: Diagnosis not present

## 2016-11-09 ENCOUNTER — Ambulatory Visit (INDEPENDENT_AMBULATORY_CARE_PROVIDER_SITE_OTHER): Payer: Medicare Other | Admitting: Internal Medicine

## 2016-11-09 ENCOUNTER — Encounter: Payer: Self-pay | Admitting: Internal Medicine

## 2016-11-09 VITALS — BP 136/79 | HR 63 | Ht 66.0 in | Wt 238.0 lb

## 2016-11-09 DIAGNOSIS — I441 Atrioventricular block, second degree: Secondary | ICD-10-CM

## 2016-11-09 DIAGNOSIS — I10 Essential (primary) hypertension: Secondary | ICD-10-CM | POA: Diagnosis not present

## 2016-11-09 DIAGNOSIS — I480 Paroxysmal atrial fibrillation: Secondary | ICD-10-CM

## 2016-11-09 MED ORDER — FLECAINIDE ACETATE 100 MG PO TABS: 300.0000 mg | ORAL_TABLET | Freq: Once | ORAL | 3 refills | Status: DC

## 2016-11-09 MED ORDER — DILTIAZEM HCL 30 MG PO TABS: 30.0000 mg | ORAL_TABLET | Freq: Four times a day (QID) | ORAL | 3 refills | Status: DC | PRN

## 2016-11-09 NOTE — Progress Notes (Signed)
PCP: Caryl Bis, MD  Alicia [name] is a 71 y.o. female who presents today for routine electrophysiology followup.  Since last being seen in our clinic, the patient reports doing very well.  Today, she denies symptoms of palpitations, chest pain, shortness of breath,  lower extremity edema, dizziness, presyncope, or syncope.  afib symptoms are well controlled.  Her primary concern is with R knee pain.  The patient is otherwise without complaint today.   Past Medical History:  Diagnosis Date  . Anemia   . Diabetes ([place])   . DJD (degenerative joint disease)   . Dysrhythmia    SYMPTOMATIC BRADYCARDIA  . Hypertension    off meds at this time  . Hypothyroidism   . Obesity   . 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 Wears glasses    Past Surgical History:  Procedure Laterality Date  . ANKLE SURGERY Right    multiple  . BREAST SURGERY     biopsy  . CHOLECYSTECTOMY    . COLONOSCOPY    . EYE SURGERY     both cataracts  . FOOT ARTHROTOMY     left  . KNEE SURGERY     right  . 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 PERMANENT PACEMAKER INSERTION N/A 12/15/2012   SJM 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 by Dr Lovena Le for transient AV block  . SHOULDER ARTHROSCOPY WITH SUBACROMIAL DECOMPRESSION AND OPEN ROTATOR C Right 07/07/2013   Procedure: RIGHT SHOULDER ARTHROSCOPY WITH SUBACROMIAL DECOMPRESSION AND ROTATOR CUFF DEBRIDEMENT/ DISTAL CLAVICLE RESECTION;  Surgeon: [name], MD;  Location: [facility];  Service: Orthopedics;  Laterality: Right;  . SHOULDER SURGERY Right 2012    ROS- all systems are reviewed and negative except as per HPI above  Current Outpatient Prescriptions  Medication Sig Dispense Refill  . apixaban (ELIQUIS) 5 MG TABS tablet Take 1 tablet (5 mg total) by mouth 2 (two) times daily. PT NEEDS APPT, CALL [phone] TO SCHEDULE, 1ST ATTEMPT 60 tablet 0  .  diltiazem (CARDIZEM) 30 MG tablet Take 1 tablet (30 mg total) by mouth every 6 (six) hours as needed. 30 tablet 1  . flecainide (TAMBOCOR) 100 MG tablet Take 3 tablets (300 mg total) by mouth once. AS NEEDED 12 tablet 1  . hydrochlorothiazide (HYDRODIURIL) 25 MG tablet     . insulin NPH-regular Human (NOVOLIN 70/30) (70-30) 100 UNIT/ML injection Inject 48 Units into the skin 2 (two) times daily with a meal.     . INSULIN SYRINGE 1CC/29G 29G X 1/2" 1 ML MISC     . levothyroxine (SYNTHROID, LEVOTHROID) 100 MCG tablet Take 100 mcg by mouth daily before breakfast.    . losartan (COZAAR) 100 MG tablet     . mometasone (ELOCON) 0.1 % ointment     . Multiple Vitamins-Minerals (ICAPS) CAPS Take 1 capsule by mouth daily.     [name] (SYSTANE) 0.4-0.3 % SOLN Place 1 drop into both eyes as needed (dry eyes).    . Probiotic Product (PROBIOTIC DAILY PO) Take 1 tablet by mouth daily.     No current facility-administered medications for this visit.     Physical Exam: Vitals:   11/09/16 1050  BP: 136/79  Pulse: 63  SpO2: 97%  Weight: 238 lb (108 kg)  Height: 5\' 6"  (1.676 m)    GEN- The patient is  well appearing, alert and oriented x 3 today.   Head- normocephalic, atraumatic Eyes-  Sclera clear, conjunctiva pink Ears- hearing intact Oropharynx- clear Lungs- Clear to ausculation bilaterally, normal work of breathing Chest- pacemaker pocket is well healed Heart- Regular rate and rhythm, no murmurs, rubs or gallops, PMI not laterally displaced GI- soft, NT, ND, + BS Extremities- no clubbing, cyanosis, or edema  Pacemaker interrogation- reviewed in detail today,  See PACEART report  Assessment and Plan:  1. Second degree AV block Normal pacemaker function See Pace Art report No changes today  2. Paroxysmal atrial fibrillation Well controlled Lifestyle modification encouraged chads2vasc score is 4 Continue on eliquis Prn flecainide/ diltiazem  3. Obesity Body  mass index is 38.41 kg/m.   4. HTN Lifestyle modification encouraged Stable No change required today  5. OSA Does not use CPAP   [name] Return to see me in a year unless problems occur  Thompson Grayer MD, [facility] 11/09/2016 11:00 AM

## 2016-11-09 NOTE — Patient Instructions (Signed)
Medication Instructions:  Continue all current medications.  Labwork: none  Testing/Procedures: none  Follow-Up: Your physician wants you to follow up in:  1 year.  You will receive a reminder letter in the mail one-two months in advance.  If you don't receive a letter, please call our office to schedule the follow up appointment.  (ALLRED)  Any Other Special Instructions Will Be Listed Below (If Applicable). Remote monitoring is used to monitor your Pacemaker of ICD from home. This monitoring reduces the number of office visits required to check your device to one time per year. It allows [place] to keep an eye on the functioning of your device to ensure it is working properly. You are scheduled for a device check from home on 02-11-2017. You may send your transmission at any time that day. If you have a wireless device, the transmission will be sent automatically. After your physician reviews your transmission, you will receive a postcard with your next transmission date.  If you need a refill on your cardiac medications before your next appointment, please call your pharmacy.

## 2016-11-13 LAB — CUP PACEART INCLINIC DEVICE CHECK
Battery Remaining Longevity: 142 mo
Brady Statistic RA Percent Paced: 23 %
Brady Statistic RV Percent Paced: 0.95 %
Implantable Lead Implant Date: 20140811
Implantable Lead Location: 753859
Lead Channel Pacing Threshold Amplitude: 0.5 V
Lead Channel Pacing Threshold Pulse Width: 0.4 ms
Lead Channel Sensing Intrinsic Amplitude: 3.5 mV
Lead Channel Setting Pacing Amplitude: 0.875
Lead Channel Setting Pacing Pulse Width: 0.4 ms
MDC IDC LEAD IMPLANT DT: 20140811
MDC IDC LEAD LOCATION: 753860
MDC IDC MSMT BATTERY VOLTAGE: 3.01 V
MDC IDC MSMT LEADCHNL RA IMPEDANCE VALUE: 437.5 Ohm
MDC IDC MSMT LEADCHNL RV IMPEDANCE VALUE: 462.5 Ohm
MDC IDC MSMT LEADCHNL RV PACING THRESHOLD AMPLITUDE: 0.625 V
MDC IDC MSMT LEADCHNL RV PACING THRESHOLD PULSEWIDTH: 0.4 ms
MDC IDC MSMT LEADCHNL RV SENSING INTR AMPL: 12 mV
MDC IDC PG IMPLANT DT: 20140811
MDC IDC PG SERIAL: 7528878
MDC IDC SESS DTM: 20180706145621
MDC IDC SET LEADCHNL RA PACING AMPLITUDE: 1.5 V
MDC IDC SET LEADCHNL RV SENSING SENSITIVITY: 2 mV
Pulse Gen Model: 2240

## 2016-11-24 DIAGNOSIS — N950 Postmenopausal bleeding: Secondary | ICD-10-CM | POA: Diagnosis not present

## 2016-11-24 DIAGNOSIS — Z6838 Body mass index (BMI) 38.0-38.9, adult: Secondary | ICD-10-CM | POA: Diagnosis not present

## 2016-11-24 DIAGNOSIS — R3121 Asymptomatic microscopic hematuria: Secondary | ICD-10-CM | POA: Diagnosis not present

## 2016-12-11 ENCOUNTER — Encounter: Payer: Self-pay | Admitting: *Deleted

## 2016-12-12 ENCOUNTER — Encounter: Payer: Self-pay | Admitting: Adult Health

## 2016-12-12 ENCOUNTER — Ambulatory Visit (INDEPENDENT_AMBULATORY_CARE_PROVIDER_SITE_OTHER): Payer: Medicare Other | Admitting: Adult Health

## 2016-12-12 VITALS — BP 140/60 | HR 76 | Ht 67.0 in | Wt 245.0 lb

## 2016-12-12 DIAGNOSIS — N950 Postmenopausal bleeding: Secondary | ICD-10-CM | POA: Diagnosis not present

## 2016-12-12 DIAGNOSIS — N841 Polyp of cervix uteri: Secondary | ICD-10-CM

## 2016-12-12 NOTE — Patient Instructions (Signed)

## 2016-12-12 NOTE — Progress Notes (Signed)
Subjective:     Patient ID: Alicia [name], female   DOB: 06/22/1945, 71 y.o.   MRN: [id number]  HPI Alicia [name] is a 71 year old white female,in for vaginal bleeding, she is postmenopausal. She was referred by PCP [name] at [facility].  She had negative pap 11/24/16 at [facility].  Review of Systems PM vaginal bleeding for about 10 days, started 11/21/16 and was bright red, then brown with clots,she is not sexually active,but is on eliquis for A fib.and has pacemaker.Started bleeding after hard coughing episode.  Reviewed past medical,surgical, social and family history. Reviewed medications and allergies.     Objective:   Physical Exam BP 140/60 (BP Location: Right Arm, Patient Position: Sitting, Cuff Size: Large)   Pulse 76   Ht 5\' 7"  (1.702 m)   Wt 245 lb (111.1 kg)   BMI 38.37 kg/m PHQ 2 score 0.   Skin warm and dry.Pelvic: external genitalia is normal in appearance no lesions, vagina: pale, with decreased moisture and rugae,urethra has no lesions or masses noted, cervix:bulbous and has polyp at os, non friable with Q tip, uterus: normal size, shape and contour, non tender, no masses felt, adnexa: no masses or tenderness noted. Bladder is non tender and no masses felt.  Will get [place] to assess endometrial lining and if thickened will get endo biopsy and remove polyp at that time, discussed with pt and she agrees.  Assessment:     1. PMB (postmenopausal bleeding)   2. Cervical polyp       Plan:     Return for GYN [place] 12/17/16, will talk after [place] results back Review handout on PMB

## 2016-12-17 ENCOUNTER — Ambulatory Visit (INDEPENDENT_AMBULATORY_CARE_PROVIDER_SITE_OTHER): Payer: Medicare Other

## 2016-12-17 DIAGNOSIS — N950 Postmenopausal bleeding: Secondary | ICD-10-CM | POA: Diagnosis not present

## 2016-12-17 NOTE — Progress Notes (Signed)
PELVIC US TA/TV:heterogeneous anteverted uterus w/a anterior subserosal fibroid  4 x 3 x 2.8 cm,simple right ovarian cyst 2 x 2 x 2.1 cm,complex thickened EEC 15.9 mm (no color flow visualized),no free fluid,ovaries appear mobile,mult.small nabothian cysts

## 2016-12-18 ENCOUNTER — Telehealth: Payer: Self-pay | Admitting: Adult Health

## 2016-12-18 DIAGNOSIS — N950 Postmenopausal bleeding: Secondary | ICD-10-CM

## 2016-12-18 NOTE — Telephone Encounter (Signed)
Pt aware US showed thickened endometrium 15.9 mm, will get endo bx with Dr [name] or Glo Herring and also remove endocervical polyp at that time, pt aware not to take Eliquis the day before and day of biopsy

## 2016-12-31 ENCOUNTER — Encounter: Payer: Self-pay | Admitting: Obstetrics & Gynecology

## 2016-12-31 ENCOUNTER — Ambulatory Visit (INDEPENDENT_AMBULATORY_CARE_PROVIDER_SITE_OTHER): Payer: Medicare Other | Admitting: Obstetrics & Gynecology

## 2016-12-31 ENCOUNTER — Other Ambulatory Visit: Payer: Self-pay | Admitting: Obstetrics & Gynecology

## 2016-12-31 VITALS — BP 178/70 | HR 68 | Ht 67.0 in | Wt 240.0 lb

## 2016-12-31 DIAGNOSIS — R938 Abnormal findings on diagnostic imaging of other specified body structures: Secondary | ICD-10-CM | POA: Diagnosis not present

## 2016-12-31 DIAGNOSIS — N841 Polyp of cervix uteri: Secondary | ICD-10-CM | POA: Diagnosis not present

## 2016-12-31 DIAGNOSIS — R9389 Abnormal findings on diagnostic imaging of other specified body structures: Secondary | ICD-10-CM

## 2016-12-31 DIAGNOSIS — N950 Postmenopausal bleeding: Secondary | ICD-10-CM

## 2016-12-31 DIAGNOSIS — C541 Malignant neoplasm of endometrium: Secondary | ICD-10-CM | POA: Diagnosis not present

## 2016-12-31 NOTE — Progress Notes (Signed)
Endometrial Biopsy Procedure Note  Pre-operative Diagnosis: post menopausal bleeding with heterogenous thickened endometrium, endocervical polyp  Post-operative Diagnosis: same  Indications: as baove  Procedure Details   Urine pregnancy test was not done.  The risks (including infection, bleeding, pain, and uterine perforation) and benefits of the procedure were explained to the patient and Written informed consent was obtained.  Antibiotic prophylaxis against endocarditis was not indicated.   The patient was placed in the dorsal lithotomy position.  Bimanual exam showed the uterus to be in the neutral position.  A Graves' speculum inserted in the vagina, and the cervix prepped with povidone iodine.  Endocervical curettage with a Kevorkian curette was not performed.  She had a cervical polyp protruding through the os and was removed with a ring forcep   A sharp tenaculum was applied to the anterior lip of the cervix for stabilization.  A sterile uterine sound was used to sound the uterus to a depth of 6.5cm.  A Mylex 7mm curette was used to sample the endometrium.  Sample was sent for pathologic examination.  Condition: Stable  Complications: None  Plan:  The patient was advised to call for any fever or for prolonged or severe pain or bleeding. She was advised to use OTC acetaminophen as needed for mild to moderate pain. She was advised to avoid vaginal intercourse for 48 hours or until the bleeding has completely stopped.  Attending Physician Documentation: I was present for or performed the following: removal of cervical polyp and endometrial biopsy

## 2017-01-01 DIAGNOSIS — E1151 Type 2 diabetes mellitus with diabetic peripheral angiopathy without gangrene: Secondary | ICD-10-CM | POA: Diagnosis not present

## 2017-01-01 DIAGNOSIS — E1140 Type 2 diabetes mellitus with diabetic neuropathy, unspecified: Secondary | ICD-10-CM | POA: Diagnosis not present

## 2017-01-01 DIAGNOSIS — M79671 Pain in right foot: Secondary | ICD-10-CM | POA: Diagnosis not present

## 2017-01-01 DIAGNOSIS — L600 Ingrowing nail: Secondary | ICD-10-CM | POA: Diagnosis not present

## 2017-01-02 DIAGNOSIS — H353132 Nonexudative age-related macular degeneration, bilateral, intermediate dry stage: Secondary | ICD-10-CM | POA: Diagnosis not present

## 2017-01-02 DIAGNOSIS — H35033 Hypertensive retinopathy, bilateral: Secondary | ICD-10-CM | POA: Diagnosis not present

## 2017-01-02 DIAGNOSIS — E113293 Type 2 diabetes mellitus with mild nonproliferative diabetic retinopathy without macular edema, bilateral: Secondary | ICD-10-CM | POA: Diagnosis not present

## 2017-01-02 DIAGNOSIS — H43393 Other vitreous opacities, bilateral: Secondary | ICD-10-CM | POA: Diagnosis not present

## 2017-01-03 DIAGNOSIS — M1711 Unilateral primary osteoarthritis, right knee: Secondary | ICD-10-CM | POA: Diagnosis not present

## 2017-01-10 ENCOUNTER — Encounter: Payer: Self-pay | Admitting: Obstetrics & Gynecology

## 2017-01-10 ENCOUNTER — Ambulatory Visit (INDEPENDENT_AMBULATORY_CARE_PROVIDER_SITE_OTHER): Payer: Medicare Other | Admitting: Obstetrics & Gynecology

## 2017-01-10 VITALS — BP 160/70 | HR 72 | Wt 248.0 lb

## 2017-01-10 DIAGNOSIS — C541 Malignant neoplasm of endometrium: Secondary | ICD-10-CM | POA: Diagnosis not present

## 2017-01-10 MED ORDER — LEVOTHYROXINE SODIUM 112 MCG PO TABS: 112.0000 ug | ORAL_TABLET | Freq: Every day | ORAL | 3 refills | Status: DC

## 2017-01-10 NOTE — Progress Notes (Signed)
Follow up appointment for results  Chief Complaint  Patient presents with  . Follow-up    result biopsy    Blood pressure (!) 160/70, pulse 72, weight 248 lb (112.5 kg).    Grade II endometrial adenocarcinoma, benign endometrial polyp  MEDS ordered this encounter: Meds ordered this encounter  Medications  . levothyroxine (SYNTHROID) 112 MCG tablet    Sig: Take 1 tablet (112 mcg total) by mouth daily before breakfast.    Dispense:  30 tablet    Refill:  3    Orders for this encounter: No orders of the defined types were placed in this encounter.   Impression: Grade II adenocarcinoma of the endometrium  Plan: Have arranged an appointment with GYN oncology colleagues for management She [place] aware she will need surgery and possibly post operative adjuvant therapy in the form of radiation or less likely chemotherapy Overall has low risk chnce of needing either  Follow Up: Return if symptoms worsen or fail to improve.       Face to face time:  15 minutes  Greater than 50% of the visit time was spent in counseling and coordination of care with the patient.  The summary and outline of the counseling and care coordination is summarized in the note above.   All questions were answered.  Past Medical History:  Diagnosis Date  . Anemia   . Diabetes ([place])   . DJD (degenerative joint disease)   . Dysrhythmia    SYMPTOMATIC BRADYCARDIA  . Hypertension    off meds at this time  . Hypothyroidism   . Obesity   . 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 Wears glasses     Past Surgical History:  Procedure Laterality Date  . ANKLE SURGERY Right    multiple  . BREAST SURGERY     biopsy  . CHOLECYSTECTOMY    . COLONOSCOPY    . EYE SURGERY     both cataracts  . FOOT ARTHROTOMY     left  . KNEE SURGERY     right  . 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 PERMANENT  PACEMAKER INSERTION N/A 12/15/2012   SJM 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 by Dr Lovena Le for transient AV block  . SHOULDER ARTHROSCOPY WITH SUBACROMIAL DECOMPRESSION AND OPEN ROTATOR C Right 07/07/2013   Procedure: RIGHT SHOULDER ARTHROSCOPY WITH SUBACROMIAL DECOMPRESSION AND ROTATOR CUFF DEBRIDEMENT/ DISTAL CLAVICLE RESECTION;  Surgeon: [name], MD;  Location: [facility];  Service: Orthopedics;  Laterality: Right;  . SHOULDER SURGERY Right 2012    OB History    Gravida Para Term Preterm AB Living   2 2 1 1   2    SAB TAB Ectopic Multiple Live Births           2      Allergies  Allergen Reactions  . Ace Inhibitors Cough  . Cardizem [Diltiazem Hcl] Other (See Comments)    headache  . Oxycodone Nausea Only  . Statins Other (See Comments)    myalgias  . Sulfa Antibiotics Hives and Swelling  . Tape     Paper tape is okay  . Cortisone Rash    flushing  . Latex Rash    Sensitive not allergic  . Tartrazine Rash    Social History   Social History  . Marital status: Married    Spouse name:  N/A  . Number of children: N/A  . Years of education: N/A   Social History Main Topics  . Smoking status: Never Smoker  . Smokeless tobacco: Never Used  . Alcohol use No  . Drug use: No  . Sexual activity: Not Currently    Birth control/ protection: Post-menopausal   Other Topics Concern  . None   Social History Narrative   Retired and lives in [place] with spouse    Family History  Problem Relation Age of Onset  . Sudden death Brother        age 26s, died while playing raquetball  . Stroke Mother   . Cancer Father        throat  . Allergic rhinitis Neg Hx   . Angioedema Neg Hx   . Asthma Neg Hx   . Atopy Neg Hx   . Eczema Neg Hx   . Immunodeficiency Neg Hx   . Urticaria Neg Hx

## 2017-01-11 ENCOUNTER — Telehealth: Payer: Self-pay | Admitting: *Deleted

## 2017-01-11 NOTE — Telephone Encounter (Signed)
Contacted the patient and gave her the appt for September 12th at 9:45am.

## 2017-01-16 ENCOUNTER — Encounter: Payer: Self-pay | Admitting: Gynecologic Oncology

## 2017-01-16 ENCOUNTER — Ambulatory Visit: Payer: Medicare Other | Attending: Gynecologic Oncology | Admitting: Gynecologic Oncology

## 2017-01-16 VITALS — BP 178/78 | HR 63 | Temp 97.8°F | Resp 18 | Wt 249.2 lb

## 2017-01-16 DIAGNOSIS — R600 Localized edema: Secondary | ICD-10-CM | POA: Insufficient documentation

## 2017-01-16 DIAGNOSIS — I4891 Unspecified atrial fibrillation: Secondary | ICD-10-CM | POA: Diagnosis not present

## 2017-01-16 DIAGNOSIS — M1990 Unspecified osteoarthritis, unspecified site: Secondary | ICD-10-CM | POA: Insufficient documentation

## 2017-01-16 DIAGNOSIS — C541 Malignant neoplasm of endometrium: Secondary | ICD-10-CM | POA: Diagnosis not present

## 2017-01-16 DIAGNOSIS — E119 Type 2 diabetes mellitus without complications: Secondary | ICD-10-CM | POA: Insufficient documentation

## 2017-01-16 DIAGNOSIS — E669 Obesity, unspecified: Secondary | ICD-10-CM | POA: Diagnosis not present

## 2017-01-16 DIAGNOSIS — Z7901 Long term (current) use of anticoagulants: Secondary | ICD-10-CM | POA: Diagnosis not present

## 2017-01-16 DIAGNOSIS — E039 Hypothyroidism, unspecified: Secondary | ICD-10-CM | POA: Diagnosis not present

## 2017-01-16 DIAGNOSIS — R110 Nausea: Secondary | ICD-10-CM | POA: Diagnosis not present

## 2017-01-16 DIAGNOSIS — I10 Essential (primary) hypertension: Secondary | ICD-10-CM | POA: Diagnosis not present

## 2017-01-16 DIAGNOSIS — Z794 Long term (current) use of insulin: Secondary | ICD-10-CM | POA: Diagnosis not present

## 2017-01-16 DIAGNOSIS — Z950 Presence of cardiac pacemaker: Secondary | ICD-10-CM | POA: Diagnosis not present

## 2017-01-16 NOTE — Patient Instructions (Addendum)
Preparing for your Surgery  You will be scheduled for a robotic assisted total laparoscopic hysterectomy, bilateral salpingo-oophorectomy, sentinel lymph node biopsy on February 05, 2017 with Dr. Everitt Amber.  Pre-operative Testing -You will receive a phone call from presurgical testing at [facility] to arrange for a pre-operative testing appointment before your surgery.  This appointment normally occurs one to two weeks before your scheduled surgery.   -Bring your insurance card, copy of an advanced directive if applicable, medication list  -At that visit, you will be asked to sign a consent for a possible blood transfusion in case a transfusion becomes necessary during surgery.  The need for a blood transfusion is rare but having consent is a necessary part of your care.     -You should not be taking blood thinners or aspirin at least ten days prior to surgery unless instructed by your surgeon.  Day Before Surgery at [place] will be asked to take in a light diet the day before surgery.  Avoid carbonated beverages.  You will be advised to have nothing to eat or drink after midnight the evening before.     Eat a light diet the day before surgery.  Examples including soups, broths, toast, yogurt, mashed potatoes.  Things to avoid include carbonated beverages (fizzy beverages), raw fruits and raw vegetables, or beans.    If your bowels are filled with gas, your surgeon will have difficulty visualizing your pelvic organs which increases your surgical risks.  Your role in recovery Your role is to become active as soon as directed by your doctor, while still giving yourself time to heal.  Rest when you feel tired. You will be asked to do the following in order to speed your recovery:  - Cough and breathe deeply. This helps toclear and expand your lungs and can prevent pneumonia. You may be given a spirometer to practice deep breathing. A staff member will show you how to  use the spirometer. - Do mild physical activity. Walking or moving your legs help your circulation and body functions return to normal. A staff member will help you when you try to walk and will provide you with simple exercises. Do not try to get up or walk alone the first time. - Actively manage your pain. Managing your pain lets you move in comfort. We will ask you to rate your pain on a scale of zero to 10. It is your responsibility to tell your doctor or nurse where and how much you hurt so your pain can be treated.  Special Considerations -If you are diabetic, you may be placed on insulin after surgery to have closer control over your blood sugars to promote healing and recovery.  This does not mean that you will be discharged on insulin.  If applicable, your oral antidiabetics will be resumed when you are tolerating a solid diet.  -Your final pathology results from surgery should be available by the Friday after surgery and the results will be relayed to you when available.   Blood Transfusion Information WHAT IS A BLOOD TRANSFUSION? A transfusion is the replacement of blood or some of its parts. Blood is made up of multiple cells which provide different functions.  Red blood cells carry oxygen and are used for blood loss replacement.  White blood cells fight against infection.  Platelets control bleeding.  Plasma helps clot blood.  Other blood products are  available for specialized needs, such as hemophilia or other clotting disorders. BEFORE THE TRANSFUSION  Who gives blood for transfusions?   You may be able to donate blood to be used at a later date on yourself (autologous donation).  Relatives can be asked to donate blood. This is generally not any safer than if you have received blood from a stranger. The same precautions are taken to ensure safety when a relative's blood is donated.  Healthy volunteers who are fully evaluated to make sure their blood is safe. This is blood  bank blood. Transfusion therapy is the safest it has ever been in the practice of medicine. Before blood is taken from a donor, a complete history is taken to make sure that person has no history of diseases nor engages in risky social behavior (examples are intravenous drug use or sexual activity with multiple partners). The donor's travel history is screened to minimize risk of transmitting infections, such as malaria. The donated blood is tested for signs of infectious diseases, such as HIV and hepatitis. The blood is then tested to be sure it is compatible with you in order to minimize the chance of a transfusion reaction. If you or a relative donates blood, this is often done in anticipation of surgery and is not appropriate for emergency situations. It takes many days to process the donated blood. RISKS AND COMPLICATIONS Although transfusion therapy is very safe and saves many lives, the main dangers of transfusion include:   Getting an infectious disease.  Developing a transfusion reaction. This is an allergic reaction to something in the blood you were given. Every precaution is taken to prevent this. The decision to have a blood transfusion has been considered carefully by your caregiver before blood is given. Blood is not given unless the benefits outweigh the risks.   Total Laparoscopic Hysterectomy A total laparoscopic hysterectomy is a minimally invasive surgery to remove your uterus and cervix. This surgery is performed by making several small cuts (incisions) in your abdomen. It can also be done with a thin, lighted tube (laparoscope) inserted into two small incisions in your lower abdomen. Your fallopian tubes and ovaries can be removed (bilateral salpingo-oophorectomy) during this surgery as well.Benefits of minimally invasive surgery include:  Less pain.  Less risk of blood loss.  Less risk of infection.  Quicker return to normal activities.  Tell a health care provider  about:  Any allergies you have.  All medicines you are taking, including vitamins, herbs, eye drops, creams, and over-the-counter medicines.  Any problems you or family members have had with anesthetic medicines.  Any blood disorders you have.  Any surgeries you have had.  Any medical conditions you have. What are the risks? Generally, this is a safe procedure. However, as with any procedure, complications can occur. Possible complications include:  Bleeding.  Blood clots in the legs or lung.  Infection.  Injury to surrounding organs.  Problems with anesthesia.  Early menopause symptoms (hot flashes, night sweats, insomnia).  Risk of conversion to an open abdominal incision.  What happens before the procedure?  Ask your health care provider about changing or stopping your regular medicines.  Do not take aspirin or blood thinners (anticoagulants) for 1 week before the surgery or as told by your health care provider.  Do not eat or drink anything for 8 hours before the surgery or as told by your health care provider.  Quit smoking if you smoke.  Arrange for a ride home after surgery  and for someone to help you at home during recovery. What happens during the procedure?  You will be given antibiotic medicine.  An IV tube will be placed in your arm. You will be given medicine to make you sleep (general anesthetic).  A gas (carbon dioxide) will be used to inflate your abdomen. This will allow your surgeon to look inside your abdomen, perform your surgery, and treat any other problems found if necessary.  Three or four small incisions (often less than 1/2 inch) will be made in your abdomen. One of these incisions will be made in the area of your belly button (navel). The laparoscope will be inserted into the incision. Your surgeon will look through the laparoscope while doing your procedure.  Other surgical instruments will be inserted through the other incisions.  Your  uterus may be removed through your vagina or cut into small pieces and removed through the small incisions.  Your incisions will be closed. What happens after the procedure?  The gas will be released from inside your abdomen.  You will be taken to the recovery area where a nurse will watch and check your progress. Once you are awake, stable, and taking fluids well, without other problems, you will return to your room or be allowed to go home.  There is usually minimal discomfort following the surgery because the incisions are so small.  You will be given pain medicine while you are in the hospital and for when you go home. This information is not intended to replace advice given to you by your health care provider. Make sure you discuss any questions you have with your health care provider. Document Released: 02/18/2007 Document Revised: 09/29/2015 Document Reviewed: 11/11/2012 Elsevier Interactive Patient Education  2017 [employer]. Total Laparoscopic Hysterectomy, Care After Refer to this sheet in the next few weeks. These instructions provide you with information on caring for yourself after your procedure. Your health care provider may also give you more specific instructions. Your treatment has been planned according to current medical practices, but problems sometimes occur. Call your health care provider if you have any problems or questions after your procedure. What can I expect after the procedure?  Pain and bruising at the incision sites. You will be given pain medicine to control it.  Menopausal symptoms such as hot flashes, night sweats, and insomnia if your ovaries were removed.  Sore throat from the breathing tube that was inserted during surgery. Follow these instructions at home:  Only take over-the-counter or prescription medicines for pain, discomfort, or fever as directed by your health care provider.  Do not take aspirin. It can cause bleeding.  Do not drive when  taking pain medicine.  Follow your health care provider's advice regarding diet, exercise, lifting, driving, and general activities.  Resume your usual diet as directed and allowed.  Get plenty of rest and sleep.  Do not douche, use tampons, or have sexual intercourse for at least 6 weeks, or until your health care provider gives you permission.  Change your bandages (dressings) as directed by your health care provider.  Monitor your temperature and notify your health care provider of a fever.  Take showers instead of baths for 2-3 weeks.  Do not drink alcohol until your health care provider gives you permission.  If you develop constipation, you may take a mild laxative with your health care provider's permission. Bran foods may help with constipation problems. Drinking enough fluids to keep your urine clear or pale yellow  may help as well.  Try to have someone home with you for 1-2 weeks to help around the house.  Keep all of your follow-up appointments as directed by your health care provider. Contact a health care provider if:  You have swelling, redness, or increasing pain around your incision sites.  You have pus coming from your incision.  You notice a bad smell coming from your incision.  Your incision breaks open.  You feel dizzy or lightheaded.  You have pain or bleeding when you urinate.  You have persistent diarrhea.  You have persistent nausea and vomiting.  You have abnormal vaginal discharge.  You have a rash.  You have any type of abnormal reaction or develop an allergy to your medicine.  You have poor pain control with your prescribed medicine. Get help right away if:  You have chest pain or shortness of breath.  You have severe abdominal pain that is not relieved with pain medicine.  You have pain or swelling in your legs. This information is not intended to replace advice given to you by your health care provider. Make sure you discuss any  questions you have with your health care provider. Document Released: 02/11/2013 Document Revised: 09/29/2015 Document Reviewed: 11/11/2012 Elsevier Interactive Patient Education  2017 [employer].

## 2017-01-16 NOTE — Progress Notes (Signed)
Consult Note: Gyn-Onc  Alicia [name] 71 y.o. female  CC:  Chief Complaint  Patient presents with  . Endometrial cancer [facility])    HPI: Patient is seen today in consultation at the request of Dr. [name].  Patient is a very pleasant 71 year old gravida 2 para 2 has been menopausal since her mid-40s. She's never took any hormone replacement therapy. She had an episode of bright red bleeding which then turned to brown in July 2018. She states she was seen by her primary care provider who performed a Pap smear. She does not know what those results are. She was then referred to Dr. [name]. She underwent a pelvic ultrasound on 12/17/2016. It revealed the uterus to be 9.8 x 4.9 x 6.1 cm with an anterior subserosal fibroid measuring 4 x 3 x 2.8 cm. The endometrium was 1.59 cm. The adnexa were unremarkable. She subsequently underwent an endometrial biopsy on August 27. She had a benign endocervical polyp. 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 Is for this reason that she is referred to [place] today.  She is otherwise in her usual state of health. She states she is always cold and relates that secondary to her thyroid disease. She states last night she will cup sweating and what she describes is disoriented. She checked her sugar and her blood pressure. She states her blood pressure was okay and checked her sugar which was 56. She then took glucose pills that she had at home and a repair. Her Accu-Cheks went from 51 then to 30 and she started feeling better. This morning her blood sugar was 96. Her last A1c was 6.3. She states that usually her sugars run from 110 220. This is a first time that she has had to take sugar pills at home. She states she's had one other episode of what she describes as hypoglycemia at a restaurant and she felt better after drinking orange juice. After she wasn't sure that was related to her atrial fibrillation she felt a little bit nauseated and occasionally with her  atrial fibrillation she will. Slightly nauseated.  She denies any chest pain or shortness of breath. She had a pacemaker placed about 6 months after that began experiencing a defibrillation. She's not sure when her last A. fib episode was. She's never had a cardiac catheterization. Her cardiologist is Dr. [name] and she saw him in the summer. She does endorse some lower extremity edema. She is on hydrochlorothiazide but she last took it on Saturday she needs to get her prescription refilled. Her last mammogram was 3 years ago. She has not had once she had a pacemaker placed for fear that it would hurt. Her last colonoscopy was in 2003.  Her father's health is notable for head and neck cancer he was a heavy smoker and drink. He certainly died at the age of 63 from a tongue cancer. Her mother died of a stroke. All of her mother's siblings except one died of cancer. This includes men and women. She does not know the types.  Review of Systems: Constitutional: Feels cold all the time, this is no change Skin: No rash Cardiovascular: No chest pain, shortness of breath, + LE edema as she has not taken her Lasix and Saturday Pulmonary: No cough or wheeze.  Gastro Intestinal:  No nausea, vomiting, constipation, or diarrhea reported. No bright red blood per rectum or change in bowel movement.  Genitourinary: No frequency, urgency, or dysuria.  + vaginal bleeding and discharge.  Musculoskeletal: +  Bilateral knee pain. She states that she needs injections per mail in her right knee. She had arthroscopic knee surgery that "did not work" as she took a blood thinner and had a "hemorrhage" into her knee.  Neurologic: No weakness Psychology: No changes  Current Meds:  Outpatient Encounter Prescriptions as of 01/16/2017  Medication Sig  . apixaban (ELIQUIS) 5 MG TABS tablet Take 1 tablet (5 mg total) by mouth 2 (two) times daily. PT NEEDS APPT, CALL [phone] TO SCHEDULE, 1ST ATTEMPT  . flecainide (TAMBOCOR) 100  MG tablet Take 3 tablets (300 mg total) by mouth once. AS NEEDED  . hydrochlorothiazide (HYDRODIURIL) 25 MG tablet Take 25 mg by mouth daily.   . insulin NPH-regular Human (NOVOLIN 70/30) (70-30) 100 UNIT/ML injection Inject 48 Units into the skin 2 (two) times daily with a meal.   . INSULIN SYRINGE 1CC/29G 29G X 1/2" 1 ML MISC   . levothyroxine (SYNTHROID) 112 MCG tablet Take 1 tablet (112 mcg total) by mouth daily before breakfast.  . levothyroxine (SYNTHROID, LEVOTHROID) 100 MCG tablet   . Multiple Vitamins-Minerals (ICAPS) CAPS Take 1 capsule by mouth daily.   Alicia [name] Glycol-Propyl Glycol (SYSTANE) 0.4-0.3 % SOLN Place 1 drop into both eyes as needed (dry eyes).  . Probiotic Product (PROBIOTIC DAILY PO) Take 1 tablet by mouth daily.   No facility-administered encounter medications on file as of 01/16/2017.     Allergy:  Allergies  Allergen Reactions  . Ace Inhibitors Cough  . Cardizem [Diltiazem Hcl] Other (See Comments)    headache  . Oxycodone Nausea Only  . Statins Other (See Comments)    myalgias  . Sulfa Antibiotics Hives and Swelling  . Tape     Paper tape is okay  . Cortisone Rash    flushing  . Latex Rash    Sensitive not allergic  . Tartrazine Rash    Social Hx:   Social History   Social History  . Marital status: Married    Spouse name: N/A  . Number of children: N/A  . Years of education: N/A   Occupational History  . Not on file.   Social History Main Topics  . Smoking status: Never Smoker  . Smokeless tobacco: Never Used  . Alcohol use No  . Drug use: No  . Sexual activity: Not Currently    Birth control/ protection: Post-menopausal   Other Topics Concern  . Not on file   Social History Narrative   Retired and lives in [place] with spouse    Past Surgical Hx:  Past Surgical History:  Procedure Laterality Date  . ANKLE SURGERY Right    multiple  . BREAST SURGERY     biopsy  . CHOLECYSTECTOMY    . COLONOSCOPY    . EYE SURGERY      both cataracts  . FOOT ARTHROTOMY     left  . KNEE SURGERY     right  . 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 PERMANENT PACEMAKER INSERTION N/A 12/15/2012   SJM 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 by Dr Lovena Le for transient AV block  . SHOULDER ARTHROSCOPY WITH SUBACROMIAL DECOMPRESSION AND OPEN ROTATOR C Right 07/07/2013   Procedure: RIGHT SHOULDER ARTHROSCOPY WITH SUBACROMIAL DECOMPRESSION AND ROTATOR CUFF DEBRIDEMENT/ DISTAL CLAVICLE RESECTION;  Surgeon: [name], MD;  Location: [facility];  Service: Orthopedics;  Laterality: Right;  . SHOULDER SURGERY Right 2012  Past Medical Hx:  Past Medical History:  Diagnosis Date  . Anemia   . Diabetes ([place])   . DJD (degenerative joint disease)   . Dysrhythmia    SYMPTOMATIC BRADYCARDIA  . Hypertension    off meds at this time  . Hypothyroidism   . Obesity   . 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 Wears glasses     Oncology Hx:    Endometrial ca [facility])   12/31/2016 Initial Diagnosis    Endometrial ca [facility])       Family Hx:  Family History  Problem Relation Age of Onset  . Sudden death Brother        age 23s, died while playing raquetball  . Stroke Mother   . Cancer Father        throat  . Allergic rhinitis Neg Hx   . Angioedema Neg Hx   . Asthma Neg Hx   . Atopy Neg Hx   . Eczema Neg Hx   . Immunodeficiency Neg Hx   . Urticaria Neg Hx     Vitals:  Blood pressure (!) 178/78, pulse 63, temperature 97.8 F (36.6 C), temperature source Oral, resp. rate 18, weight 249 lb 3.2 oz (113 kg), SpO2 98 %.  Physical Exam: Well-nourished older white female in no acute distress.  Neck: Supple, no lymphadenopathy, no thyromegaly.  Lungs: Clear to auscultation bilaterally.  Cardiac: Regular rate and rhythm. No murmur.  Abdomen: Obese well-healed laparoscopic incisions. Abdomen is soft, nontender, nondistended.  There are no palpable masses or hepatomegaly. Exam is somewhat limited by habitus.  Groins: No lymphadenopathy.  Extremities: 2+ pitting edema equal bilaterally in the lower extremities. No calf tenderness.  Pelvic: External genitalia is within normal limits. Vagina is atrophic. The cervix is visualized. His multiparous. There are no visible lesions there is no discharge. Bimanual dissemination [place] corpus is slightly globular but not markedly enlarged. The uterus is of normal size shape and consistency. There are no adnexal masses. Rectal confirms.  Assessment/Plan: 71 year old with a clinical stage I grade 2 endometrial carcinoma. Lengthy discussion today was had with the patient and her husband regarding surgical options.  We discussed the role of surgery including minimally invasive surgery versus abdominal hysterectomy. We discussed that minimally invasive surgery would allow [name] recovery, less pain and she is willing to proceed with that. She has previously undergone a laparoscopic cholecystectomy she was very happy to hear that we would be able to perform her hysterectomy in the same minimally invasive fashion. I did discuss with her that we use a robotic platform. Her questions regarding minimally invasive surgery were elicited in answer to her satisfaction. We also discussed the role of sentinel lymph node removal versus complete lymphadenectomy. She understands that should she not map to one or both sides will need to proceed with a complete lymphadenectomy on both sides.  She was offered a surgery date of next week however the patient has a church function that she would like to attend from September 26 to the 29th. She will like to defer surgery until after that time. I do not think that it terribly unreasonable as it would really only delay her surgery by 10 days. At this time she scheduled for robotic hysterectomy, bilateral salpingo-oophorectomy and bilateral sentinel lymph node  dissection with possible lymphadenectomy for 02/05/2017 with Dr. Everitt Amber. Risks of surgery including but not limited to bleeding, infection, injury to surrounding organs and thromboembolic disease were discussed.  With regards to her atrial fibrillation, her CHADS2 score is 4 based on diabetes, hypertension, gender and age. With this she has approximately 5% risk of stroke per year. We have a message into Dr. [name] regarding the timing of stopping her anticoagulation around whether or not she needs to be bridged around the time of surgery. We will notify the patient once we hear back from him regarding his recommendations.  The patient and her husband's questions were elicited in answer to her satisfaction. On a social note, she and her husband play Mr. and Mrs. Claus around the holidays.  It was a pleasure to participate in the care of this very pleasant patient.   Jeralynn Vaquera A., MD 01/16/2017, 10:34 AM

## 2017-01-17 ENCOUNTER — Telehealth: Payer: Self-pay | Admitting: Pharmacist

## 2017-01-17 NOTE — Telephone Encounter (Signed)
-----   Message from Thompson Grayer, MD sent at 01/17/2017  2:28 PM EDT ----- Ok to proceed with surgery if medically indicated Anticoagulation to assist with recs related to eliquis   ----- Message ----- From: Dorothyann Gibbs, NP Sent: 01/16/2017  10:39 AM To: Thompson Grayer, MD  Hello,    Writing about a mutual patient, Alicia [name].  We are planning on doing a robotic assisted total hysterectomy, BSO, sentinel lymph node biopsy for endometrial cancer.  She last saw you in July 2018.  We are requesting cardiac clearance to proceed along with recommendations for optimization for surgery.  She is also taking Eliquis.  Thank you!  Joylene John, NP for Dr. [facility] [phone]

## 2017-01-17 NOTE — Telephone Encounter (Signed)
Pt takes Eliquis for afib with CHADS2 of 2 (HTN, DM) and CHADS2VASc of 4 (HTN, DM, sex, age). Higher bleed risk procedure. We have not checked a BMET in 2-3 years so do not know how fast pt will clear Eliquis. Called over to [facility] office Dr Gar Ponto to see if they had checked labs. SCr from June 2018 was 1.12. Ok to hold Eliquis for 2 days prior to procedure. Clearance routed to Joylene John, NP.

## 2017-01-18 ENCOUNTER — Telehealth: Payer: Self-pay | Admitting: *Deleted

## 2017-01-18 NOTE — Telephone Encounter (Signed)
-----   Message from Thompson Grayer, MD sent at 01/17/2017  2:28 PM EDT ----- Ok to proceed with surgery if medically indicated Anticoagulation to assist with recs related to eliquis   ----- Message ----- From: Dorothyann Gibbs, NP Sent: 01/16/2017  10:39 AM To: Thompson Grayer, MD  Hello,    Writing about a mutual patient, Alicia [name].  We are planning on doing a robotic assisted total hysterectomy, BSO, sentinel lymph node biopsy for endometrial cancer.  She last saw you in July 2018.  We are requesting cardiac clearance to proceed along with recommendations for optimization for surgery.  She is also taking Eliquis.  Thank you!  Joylene John, NP for Dr. [facility] [phone]

## 2017-01-18 NOTE — Telephone Encounter (Signed)
Sent via staff message

## 2017-01-21 ENCOUNTER — Telehealth: Payer: Self-pay | Admitting: Gynecologic Oncology

## 2017-01-21 NOTE — Telephone Encounter (Signed)
Spoke with patient about Dr. Serita Grit recommendations for holding the Eliquis 5 days prior to surgery.  Advised that we received her cardiac clearance as well.  She would like to do her pre-op appt on Sept 24 or 25 since she will be going out of town.  Advised her that pre-surgical testing should be calling her today.  All questions answered.  Advised to call for any needs or concerns.

## 2017-01-28 NOTE — Patient Instructions (Addendum)
Alicia [name]  01/28/2017   Your procedure is scheduled on: 02/05/17  Report to [facility] Main  Entrance Take [address]  elevators to 3rd floor to  [place] at     [phone].    Call this number if you have problems the morning of surgery [phone]    Remember: ONLY 1 PERSON MAY GO WITH YOU TO SHORT STAY TO GET  READY MORNING OF YOUR SURGERY.               EAT A LIGHT DIET THE DAY BEFORE YOUR SURGERY.EXAMPLES ARE TOAST, YOGURT, MASHED POTATOES, SOUPS AND BROTHS, AVOID: CARBONATED BEVERAGES, RAW FRUITS AND VEGGIES AND BEANS   Do not eat food or drink liquids :After Midnight.     Take these medicines the morning of surgery with A SIP OF WATER: synthroid, flecanide if needed, citalopram,                         Diltiazem  DO NOT TAKE ANY DIABETIC MEDICATIONS DAY OF YOUR SURGERY                               You may not have any metal on your body including hair pins and              piercings  Do not wear jewelry, make-up, lotions, powders or perfumes, deodorant             Do not wear nail polish.  Do not shave  48 hours prior to surgery.     Do not bring valuables to the hospital. [place].  Contacts, dentures or bridgework may not be worn into surgery.  Leave suitcase in the car. After surgery it may be brought to your room.                  Please read over the following fact sheets you were given: _____________________________________________________________________         [facility] - Preparing for Surgery Before surgery, you can play an important role.  Because skin is not sterile, your skin needs to be as free of germs as possible.  You can reduce the number of germs on your skin by washing with CHG (chlorahexidine gluconate) soap before surgery.  CHG is an antiseptic cleaner which kills germs and bonds with the skin to continue killing germs even after washing. Please DO NOT use if you have an  allergy to CHG or antibacterial soaps.  If your skin becomes reddened/irritated stop using the CHG and inform your nurse when you arrive at Short Stay. Do not shave (including legs and underarms) for at least 48 hours prior to the first CHG shower.  You may shave your face/neck. Please follow these instructions carefully:  1.  Shower with CHG Soap the night before surgery and the  morning of Surgery.  2.  If you choose to wash your hair, wash your hair first as usual with your  normal  shampoo.  3.  After you shampoo, rinse your hair and body thoroughly to remove the  shampoo.  4.  Use CHG as you would any other liquid soap.  You can apply chg directly  to the skin and wash                       Gently with a scrungie or clean washcloth.  5.  Apply the CHG Soap to your body ONLY FROM THE NECK DOWN.   Do not use on face/ open                           Wound or open sores. Avoid contact with eyes, ears mouth and genitals (private parts).                       Wash face,  Genitals (private parts) with your normal soap.             6.  Wash thoroughly, paying special attention to the area where your surgery  will be performed.  7.  Thoroughly rinse your body with warm water from the neck down.  8.  DO NOT shower/wash with your normal soap after using and rinsing off  the CHG Soap.                9.  Pat yourself dry with a clean towel.            10.  Wear clean pajamas.            11.  Place clean sheets on your bed the night of your first shower and do not  sleep with pets. Day of Surgery : Do not apply any lotions/deodorants the morning of surgery.  Please wear clean clothes to the hospital/surgery center.  FAILURE TO FOLLOW THESE INSTRUCTIONS MAY RESULT IN THE CANCELLATION OF YOUR SURGERY PATIENT SIGNATURE_________________________________  NURSE  SIGNATURE__________________________________  ________________________________________________________________________  WHAT IS A BLOOD TRANSFUSION? Blood Transfusion Information  A transfusion is the replacement of blood or some of its parts. Blood is made up of multiple cells which provide different functions.  Red blood cells carry oxygen and are used for blood loss replacement.  White blood cells fight against infection.  Platelets control bleeding.  Plasma helps clot blood.  Other blood products are available for specialized needs, such as hemophilia or other clotting disorders. BEFORE THE TRANSFUSION  Who gives blood for transfusions?   Healthy volunteers who are fully evaluated to make sure their blood is safe. This is blood bank blood. Transfusion therapy is the safest it has ever been in the practice of medicine. Before blood is taken from a donor, a complete history is taken to make sure that person has no history of diseases nor engages in risky social behavior (examples are intravenous drug use or sexual activity with multiple partners). The donor's travel history is screened to minimize risk of transmitting infections, such as malaria. The donated blood is tested for signs of infectious diseases, such as HIV and hepatitis. The blood is then tested to be sure it is compatible with you in order to minimize the chance of a transfusion reaction. If you or a relative donates blood, this is often done in anticipation of surgery and is not appropriate for emergency situations. It takes many days to process the donated blood. RISKS AND COMPLICATIONS Although transfusion therapy is very safe and saves many lives, the main dangers of transfusion include:   Getting an infectious disease.  Developing a transfusion reaction. This  is an allergic reaction to something in the blood you were given. Every precaution is taken to prevent this. The decision to have a blood transfusion has been  considered carefully by your caregiver before blood is given. Blood is not given unless the benefits outweigh the risks. AFTER THE TRANSFUSION  Right after receiving a blood transfusion, you will usually feel much better and more energetic. This is especially true if your red blood cells have gotten low (anemic). The transfusion raises the level of the red blood cells which carry oxygen, and this usually causes an energy increase.  The nurse administering the transfusion will monitor you carefully for complications. HOME CARE INSTRUCTIONS  No special instructions are needed after a transfusion. You may find your energy is better. Speak with your caregiver about any limitations on activity for underlying diseases you may have. SEEK MEDICAL CARE IF:   Your condition is not improving after your transfusion.  You develop redness or irritation at the intravenous (IV) site. SEEK IMMEDIATE MEDICAL CARE IF:  Any of the following symptoms occur over the next 12 hours:  Shaking chills.  You have a temperature by mouth above 102 F (38.9 C), not controlled by medicine.  Chest, back, or muscle pain.  People around you feel you are not acting correctly or are confused.  Shortness of breath or difficulty breathing.  Dizziness and fainting.  You get a rash or develop hives.  You have a decrease in urine output.  Your urine turns a dark color or changes to pink, red, or brown. Any of the following symptoms occur over the next 10 days:  You have a temperature by mouth above 102 F (38.9 C), not controlled by medicine.  Shortness of breath.  Weakness after normal activity.  The white part of the eye turns yellow (jaundice).  You have a decrease in the amount of urine or are urinating less often.  Your urine turns a dark color or changes to pink, red, or brown. Document Released: 04/20/2000 Document Revised: 07/16/2011 Document Reviewed: 12/08/2007 ExitCare Patient Information 2014  [place].  _______________________________________________________________________  Incentive Spirometer  An incentive spirometer is a tool that can help keep your lungs clear and active. This tool measures how well you are filling your lungs with each breath. Taking long deep breaths may help reverse or decrease the chance of developing breathing (pulmonary) problems (especially infection) following:  A long period of time when you are unable to move or be active. BEFORE THE PROCEDURE   If the spirometer includes an indicator to show your best effort, your nurse or respiratory therapist will set it to a desired goal.  If possible, sit up straight or lean slightly forward. Try not to slouch.  Hold the incentive spirometer in an upright position. INSTRUCTIONS FOR USE  1. Sit on the edge of your bed if possible, or sit up as far as you can in bed or on a chair. 2. Hold the incentive spirometer in an upright position. 3. Breathe out normally. 4. Place the mouthpiece in your mouth and seal your lips tightly around it. 5. Breathe in slowly and as deeply as possible, raising the piston or the ball toward the top of the column. 6. Hold your breath for 3-5 seconds or for as long as possible. Allow the piston or ball to fall to the bottom of the column. 7. Remove the mouthpiece from your mouth and breathe out normally. 8. Rest for a few seconds and repeat Steps 1  through 7 at least 10 times every 1-2 hours when you are awake. Take your time and take a few normal breaths between deep breaths. 9. The spirometer may include an indicator to show your best effort. Use the indicator as a goal to work toward during each repetition. 10. After each set of 10 deep breaths, practice coughing to be sure your lungs are clear. If you have an incision (the cut made at the time of surgery), support your incision when coughing by placing a pillow or rolled up towels firmly against it. Once you are able to get  out of bed, walk around indoors and cough well. You may stop using the incentive spirometer when instructed by your caregiver.  RISKS AND COMPLICATIONS  Take your time so you do not get dizzy or light-headed.  If you are in pain, you may need to take or ask for pain medication before doing incentive spirometry. It is harder to take a deep breath if you are having pain. AFTER USE  Rest and breathe slowly and easily.  It can be helpful to keep track of a log of your progress. Your caregiver can provide you with a simple table to help with this. If you are using the spirometer at home, follow these instructions: [facility] IF:   You are having difficultly using the spirometer.  You have trouble using the spirometer as often as instructed.  Your pain medication is not giving enough relief while using the spirometer.  You develop fever of 100.5 F (38.1 C) or higher. SEEK IMMEDIATE MEDICAL CARE IF:   You cough up bloody sputum that had not been present before.  You develop fever of 102 F (38.9 C) or greater.  You develop worsening pain at or near the incision site. MAKE SURE YOU:   Understand these instructions.  Will watch your condition.  Will get help right away if you are not doing well or get worse. Document Released: 09/03/2006 Document Revised: 07/16/2011 Document Reviewed: 11/04/2006 ExitCare Patient Information 2014 ExitCare, [place].   ________________________________________________________________________ How to Manage Your Diabetes Before and After Surgery  Why is it important to control my blood sugar before and after surgery? . Improving blood sugar levels before and after surgery helps healing and can limit problems. . A way of improving blood sugar control is eating a healthy diet by: o  Eating less sugar and carbohydrates o  Increasing activity/exercise o  Talking with your doctor about reaching your blood sugar goals . High blood sugars (greater  than 180 mg/dL) can raise your risk of infections and slow your recovery, so you will need to focus on controlling your diabetes during the weeks before surgery. . Make sure that the doctor who takes care of your diabetes knows about your planned surgery including the date and location.  How do I manage my blood sugar before surgery? . Check your blood sugar at least 4 times a day, starting 2 days before surgery, to make sure that the level is not too high or low. o Check your blood sugar the morning of your surgery when you wake up and every 2 hours until you get to the Short Stay unit. . If your blood sugar is less than 70 mg/dL, you will need to treat for low blood sugar: o Do not take insulin. o Treat a low blood sugar (less than 70 mg/dL) with  cup of clear juice (cranberry or apple), 4 glucose tablets, OR glucose gel. o Recheck blood  sugar in 15 minutes after treatment (to make sure it is greater than 70 mg/dL). If your blood sugar is not greater than 70 mg/dL on recheck, call [phone] for further instructions. . Report your blood sugar to the short stay nurse when you get to Short Stay.  . If you are admitted to the hospital after surgery: o Your blood sugar will be checked by the staff and you will probably be given insulin after surgery (instead of oral diabetes medicines) to make sure you have good blood sugar levels. o The goal for blood sugar control after surgery is 80-180 mg/dL.   WHAT DO I DO ABOUT MY DIABETES MEDICATION?  [name] Do not take oral diabetes medicines (pills) the morning of surgery.  . THE DAY BEFORE SURGERY, take 70% OF YOUR  NPH DOSE 28 UNITS AM AND 5.6 UNITS IN THE EVENING     . THE MORNING OF SURGERY, take 0  Units  of   insulin.  . The day of surgery, do not take other diabetes injectables, including Byetta (exenatide), Bydureon (exenatide ER), Victoza (liraglutide), or Trulicity (dulaglutide).  . If your CBG is greater than 220 mg/dL, you may take  of  your sliding scale  . (correction) dose of insulin.     Patient Signature:  Date:   Nurse Signature:  Date:   Reviewed and Endorsed by [facility] Patient Education Committee, August 2015

## 2017-01-29 ENCOUNTER — Encounter (HOSPITAL_COMMUNITY)
Admission: RE | Admit: 2017-01-29 | Discharge: 2017-01-29 | Disposition: A | Discharge status: Home / Self Care | Payer: Medicare Other | Source: Ambulatory Visit | Attending: Gynecologic Oncology | Admitting: Gynecologic Oncology

## 2017-01-29 ENCOUNTER — Encounter (HOSPITAL_COMMUNITY): Payer: Self-pay

## 2017-01-29 DIAGNOSIS — Z01810 Encounter for preprocedural cardiovascular examination: Secondary | ICD-10-CM | POA: Diagnosis not present

## 2017-01-29 DIAGNOSIS — Z01812 Encounter for preprocedural laboratory examination: Secondary | ICD-10-CM | POA: Diagnosis not present

## 2017-01-29 HISTORY — DX: Calculus of gallbladder without cholecystitis without obstruction: K80.20

## 2017-01-29 HISTORY — DX: Anxiety disorder, unspecified: F41.9

## 2017-01-29 HISTORY — DX: Personal history of urinary calculi: Z87.442

## 2017-01-29 HISTORY — DX: Malignant (primary) neoplasm, unspecified: C80.1

## 2017-01-29 HISTORY — DX: Sleep apnea, unspecified: G47.30

## 2017-01-29 LAB — ABO/RH: ABO/RH(D): A POS

## 2017-01-29 LAB — COMPREHENSIVE METABOLIC PANEL
ALK PHOS: 62 U/L (ref 38–126)
ALT: 14 U/L (ref 14–54)
AST: 15 U/L (ref 15–41)
Albumin: 3.7 g/dL (ref 3.5–5.0)
Anion gap: 10 (ref 5–15)
BUN: 39 mg/dL — AB (ref 6–20)
CALCIUM: 9.3 mg/dL (ref 8.9–10.3)
CHLORIDE: 101 mmol/L (ref 101–111)
CO2: 28 mmol/L (ref 22–32)
Creatinine, Ser: 2.53 mg/dL — ABNORMAL HIGH (ref 0.44–1.00)
GFR calc Af Amer: 21 mL/min — ABNORMAL LOW (ref >60)
GFR, EST NON AFRICAN AMERICAN: 18 mL/min — AB (ref >60)
Glucose, Bld: 183 mg/dL — ABNORMAL HIGH (ref 65–99)
Potassium: 4.2 mmol/L (ref 3.5–5.1)
Sodium: 139 mmol/L (ref 135–145)
Total Bilirubin: 1.1 mg/dL (ref 0.3–1.2)
Total Protein: 6.8 g/dL (ref 6.5–8.1)

## 2017-01-29 LAB — URINALYSIS, ROUTINE W REFLEX MICROSCOPIC
BACTERIA UA: NONE SEEN
Bilirubin Urine: NEGATIVE
GLUCOSE, UA: NEGATIVE mg/dL
KETONES UR: NEGATIVE mg/dL
NITRITE: NEGATIVE
PROTEIN: NEGATIVE mg/dL
Specific Gravity, Urine: 1.011 (ref 1.005–1.030)
pH: 5 (ref 5.0–8.0)

## 2017-01-29 LAB — HEMOGLOBIN A1C
HEMOGLOBIN A1C: 7.1 % — AB (ref 4.8–5.6)
MEAN PLASMA GLUCOSE: 157.07 mg/dL

## 2017-01-29 LAB — CBC
HCT: 37.2 % (ref 36.0–46.0)
HEMOGLOBIN: 12.4 g/dL (ref 12.0–15.0)
MCH: 30.4 pg (ref 26.0–34.0)
MCHC: 33.3 g/dL (ref 30.0–36.0)
MCV: 91.2 fL (ref 78.0–100.0)
PLATELETS: 275 10*3/uL (ref 150–400)
RBC: 4.08 MIL/uL (ref 3.87–5.11)
RDW: 13.1 % (ref 11.5–15.5)
WBC: 8.4 10*3/uL (ref 4.0–10.5)

## 2017-01-29 LAB — GLUCOSE, CAPILLARY: GLUCOSE-CAPILLARY: 189 mg/dL — AB (ref 65–99)

## 2017-01-29 NOTE — Progress Notes (Signed)
CMP,ua, HGBAIC done 01/29/17 routed to Dr. Denman George via epic.

## 2017-01-29 NOTE — Progress Notes (Signed)
Telephone note Dr. [name] Clearance LOV 11/09/16 Dr. [name] epic  Stress 2016 epic Last device check 11/19/16 epic

## 2017-01-30 ENCOUNTER — Telehealth: Payer: Self-pay | Admitting: Gynecologic Oncology

## 2017-01-30 NOTE — Telephone Encounter (Signed)
Returned call to patient and advised her that we would need to postpone her surgery at this time per Dr. Denman George due to elevated creatinine needing further eval.  Advised that Dr. Quillian Quince, PCP, is out of the office next week so she would be seeing him on Oct 8 at 5pm when he returns.  Advised patient she would be contacted with a new surgical date.  Advised to call for any needs.

## 2017-01-30 NOTE — Telephone Encounter (Addendum)
Called and spoke with Dr. Arcola Jansky nurse about patient's recent elevated creatinine on pre-op labs.  Dr. Quillian Quince was going to see patient tomorrow at 5pm but the patient is out of town until Monday.  Called back to Dr. Arcola Jansky office and he is out of town next week.  Appt made for Oct 8 at 5pm.  Patient informed of creatinine levels and need for evaluation prior to surgery.  She is currently out of town until Monday so she is unable to come for a redraw etc.  Advised we would need to most likely post-pone her surgery until workup has been complete.  Plan to reschedule for October 16.

## 2017-01-30 NOTE — Progress Notes (Signed)
01/29/17 final ekg in epic

## 2017-01-31 ENCOUNTER — Telehealth: Payer: Self-pay | Admitting: Gynecologic Oncology

## 2017-01-31 NOTE — Telephone Encounter (Signed)
Called patient to inform her of new potential OR date of Oct 16 but advised if workup is needed for her elevated creatinine, this date may need to be rescheduled.  Asked about resuming her Eliquis.  She states she did not bring it on her trip but she will start it when she gets home on Saturday.  Once again, advised to stop medication one week before surgery.  She is going to call the office with an update after she has met with her PCP.  Advised to call for any needs. 

## 2017-02-04 DIAGNOSIS — Z23 Encounter for immunization: Secondary | ICD-10-CM | POA: Diagnosis not present

## 2017-02-04 DIAGNOSIS — Z01812 Encounter for preprocedural laboratory examination: Secondary | ICD-10-CM | POA: Diagnosis not present

## 2017-02-04 DIAGNOSIS — N189 Chronic kidney disease, unspecified: Secondary | ICD-10-CM | POA: Diagnosis not present

## 2017-02-04 DIAGNOSIS — Z6838 Body mass index (BMI) 38.0-38.9, adult: Secondary | ICD-10-CM | POA: Diagnosis not present

## 2017-02-04 DIAGNOSIS — C541 Malignant neoplasm of endometrium: Secondary | ICD-10-CM | POA: Diagnosis not present

## 2017-02-05 ENCOUNTER — Encounter (HOSPITAL_COMMUNITY): Admission: RE | Payer: Self-pay | Source: Ambulatory Visit

## 2017-02-05 ENCOUNTER — Ambulatory Visit (HOSPITAL_COMMUNITY): Admission: RE | Admit: 2017-02-05 | Payer: Medicare Other | Source: Ambulatory Visit | Admitting: Gynecologic Oncology

## 2017-02-05 LAB — TYPE AND SCREEN
ABO/RH(D): A POS
Antibody Screen: NEGATIVE

## 2017-02-05 SURGERY — ROBOTIC ASSISTED TOTAL HYSTERECTOMY WITH BILATERAL SALPINGO OOPHORECTOMY
Anesthesia: General | Laterality: Bilateral

## 2017-02-08 NOTE — Progress Notes (Signed)
02/04/2017-last office note from [place] on chart 02/05/2017- lab from [place], EDEN, [place]- CMP on chart

## 2017-02-11 ENCOUNTER — Ambulatory Visit (INDEPENDENT_AMBULATORY_CARE_PROVIDER_SITE_OTHER): Payer: Medicare Other | Admitting: *Deleted

## 2017-02-11 DIAGNOSIS — I441 Atrioventricular block, second degree: Secondary | ICD-10-CM | POA: Diagnosis not present

## 2017-02-12 ENCOUNTER — Encounter (HOSPITAL_COMMUNITY): Payer: Self-pay | Admitting: *Deleted

## 2017-02-12 NOTE — Progress Notes (Signed)
Remote pacemaker transmission.   

## 2017-02-15 ENCOUNTER — Encounter: Payer: Self-pay | Admitting: Cardiology

## 2017-02-19 ENCOUNTER — Telehealth: Payer: Self-pay | Admitting: *Deleted

## 2017-02-19 ENCOUNTER — Ambulatory Visit (HOSPITAL_COMMUNITY): Payer: Medicare Other | Admitting: Anesthesiology

## 2017-02-19 ENCOUNTER — Encounter (HOSPITAL_COMMUNITY): Payer: Self-pay | Admitting: *Deleted

## 2017-02-19 ENCOUNTER — Ambulatory Visit (HOSPITAL_COMMUNITY)
Admission: RE | Admit: 2017-02-19 | Discharge: 2017-02-20 | Disposition: A | Discharge status: Home / Self Care | Payer: Medicare Other | Source: Ambulatory Visit | Attending: Gynecologic Oncology | Admitting: Gynecologic Oncology

## 2017-02-19 ENCOUNTER — Encounter (HOSPITAL_COMMUNITY)
Admission: RE | Disposition: A | Discharge status: Home / Self Care | Payer: Self-pay | Source: Ambulatory Visit | Attending: Gynecologic Oncology

## 2017-02-19 DIAGNOSIS — G4730 Sleep apnea, unspecified: Secondary | ICD-10-CM | POA: Insufficient documentation

## 2017-02-19 DIAGNOSIS — F419 Anxiety disorder, unspecified: Secondary | ICD-10-CM | POA: Diagnosis not present

## 2017-02-19 DIAGNOSIS — D252 Subserosal leiomyoma of uterus: Secondary | ICD-10-CM | POA: Diagnosis not present

## 2017-02-19 DIAGNOSIS — Z79899 Other long term (current) drug therapy: Secondary | ICD-10-CM | POA: Insufficient documentation

## 2017-02-19 DIAGNOSIS — C542 Malignant neoplasm of myometrium: Secondary | ICD-10-CM | POA: Insufficient documentation

## 2017-02-19 DIAGNOSIS — I4891 Unspecified atrial fibrillation: Secondary | ICD-10-CM | POA: Diagnosis not present

## 2017-02-19 DIAGNOSIS — Z7901 Long term (current) use of anticoagulants: Secondary | ICD-10-CM | POA: Diagnosis not present

## 2017-02-19 DIAGNOSIS — E039 Hypothyroidism, unspecified: Secondary | ICD-10-CM | POA: Diagnosis not present

## 2017-02-19 DIAGNOSIS — C541 Malignant neoplasm of endometrium: Secondary | ICD-10-CM | POA: Diagnosis not present

## 2017-02-19 DIAGNOSIS — Z950 Presence of cardiac pacemaker: Secondary | ICD-10-CM | POA: Insufficient documentation

## 2017-02-19 DIAGNOSIS — D270 Benign neoplasm of right ovary: Secondary | ICD-10-CM | POA: Insufficient documentation

## 2017-02-19 DIAGNOSIS — D271 Benign neoplasm of left ovary: Secondary | ICD-10-CM | POA: Insufficient documentation

## 2017-02-19 DIAGNOSIS — I10 Essential (primary) hypertension: Secondary | ICD-10-CM | POA: Diagnosis not present

## 2017-02-19 DIAGNOSIS — Z794 Long term (current) use of insulin: Secondary | ICD-10-CM | POA: Diagnosis not present

## 2017-02-19 DIAGNOSIS — E119 Type 2 diabetes mellitus without complications: Secondary | ICD-10-CM | POA: Insufficient documentation

## 2017-02-19 DIAGNOSIS — G4733 Obstructive sleep apnea (adult) (pediatric): Secondary | ICD-10-CM | POA: Diagnosis not present

## 2017-02-19 HISTORY — PX: SENTINEL NODE BIOPSY: SHX6608

## 2017-02-19 HISTORY — PX: ROBOTIC ASSISTED TOTAL HYSTERECTOMY WITH BILATERAL SALPINGO OOPHERECTOMY: SHX6086

## 2017-02-19 LAB — GLUCOSE, CAPILLARY
GLUCOSE-CAPILLARY: 150 mg/dL — AB (ref 65–99)
Glucose-Capillary: 110 mg/dL — ABNORMAL HIGH (ref 65–99)
Glucose-Capillary: 130 mg/dL — ABNORMAL HIGH (ref 65–99)
Glucose-Capillary: 176 mg/dL — ABNORMAL HIGH (ref 65–99)
Glucose-Capillary: 214 mg/dL — ABNORMAL HIGH (ref 65–99)
Glucose-Capillary: 183 mg/dL — ABNORMAL HIGH (ref 65–99)

## 2017-02-19 SURGERY — HYSTERECTOMY, TOTAL, ROBOT-ASSISTED, LAPAROSCOPIC, WITH BILATERAL SALPINGO-OOPHORECTOMY
Anesthesia: General

## 2017-02-19 MED ORDER — ONDANSETRON HCL 4 MG/2ML IJ SOLN: 4.0000 mg | Freq: Four times a day (QID) | INTRAMUSCULAR | Status: DC | PRN

## 2017-02-19 MED ORDER — SUGAMMADEX SODIUM 500 MG/5ML IV SOLN
INTRAVENOUS | Status: AC
  Filled 2017-02-19: qty 5

## 2017-02-19 MED ORDER — HYDROMORPHONE HCL-NACL 0.5-0.9 MG/ML-% IV SOSY
PREFILLED_SYRINGE | INTRAVENOUS | Status: AC
  Filled 2017-02-19: qty 1

## 2017-02-19 MED ORDER — TRAMADOL HCL 50 MG PO TABS
100.0000 mg | ORAL_TABLET | Freq: Two times a day (BID) | ORAL | Status: DC | PRN
  Administered 2017-02-19: 100 mg via ORAL
  Filled 2017-02-19: qty 2

## 2017-02-19 MED ORDER — LEVOTHYROXINE SODIUM 112 MCG PO TABS
112.0000 ug | ORAL_TABLET | Freq: Every day | ORAL | Status: DC
  Administered 2017-02-20: 112 ug via ORAL
  Filled 2017-02-19: qty 1

## 2017-02-19 MED ORDER — EPHEDRINE SULFATE-NACL 50-0.9 MG/10ML-% IV SOSY
PREFILLED_SYRINGE | INTRAVENOUS | Status: DC | PRN
  Administered 2017-02-19 (×2): 10 mg via INTRAVENOUS

## 2017-02-19 MED ORDER — CEFAZOLIN SODIUM-DEXTROSE 2-3 GM-%(50ML) IV SOLR
INTRAVENOUS | Status: DC | PRN
  Administered 2017-02-19: 2 g via INTRAVENOUS

## 2017-02-19 MED ORDER — PHENYLEPHRINE 40 MCG/ML (10ML) SYRINGE FOR IV PUSH (FOR BLOOD PRESSURE SUPPORT)
PREFILLED_SYRINGE | INTRAVENOUS | Status: AC
  Filled 2017-02-19: qty 10

## 2017-02-19 MED ORDER — CEFAZOLIN SODIUM-DEXTROSE 2-4 GM/100ML-% IV SOLN
INTRAVENOUS | Status: AC
  Filled 2017-02-19: qty 100

## 2017-02-19 MED ORDER — INSULIN ASPART 100 UNIT/ML ~~LOC~~ SOLN
0.0000 [IU] | Freq: Three times a day (TID) | SUBCUTANEOUS | Status: DC
  Administered 2017-02-19: 5 [IU] via SUBCUTANEOUS
  Administered 2017-02-19: 3 [IU] via SUBCUTANEOUS
  Administered 2017-02-20: 2 [IU] via SUBCUTANEOUS

## 2017-02-19 MED ORDER — LOSARTAN POTASSIUM 50 MG PO TABS: 100.0000 mg | ORAL_TABLET | Freq: Every day | ORAL | Status: DC

## 2017-02-19 MED ORDER — ENOXAPARIN SODIUM 30 MG/0.3ML ~~LOC~~ SOLN
30.0000 mg | SUBCUTANEOUS | Status: DC
  Administered 2017-02-20: 30 mg via SUBCUTANEOUS
  Filled 2017-02-19 (×2): qty 0.3

## 2017-02-19 MED ORDER — STERILE WATER FOR IRRIGATION IR SOLN
Status: DC | PRN
  Administered 2017-02-19: 1000 mL

## 2017-02-19 MED ORDER — MEPERIDINE HCL 50 MG/ML IJ SOLN: 6.2500 mg | INTRAMUSCULAR | Status: DC | PRN

## 2017-02-19 MED ORDER — ONDANSETRON HCL 4 MG/2ML IJ SOLN
INTRAMUSCULAR | Status: DC | PRN
  Administered 2017-02-19: 4 mg via INTRAVENOUS

## 2017-02-19 MED ORDER — STERILE WATER FOR INJECTION IJ SOLN
INTRAMUSCULAR | Status: AC
  Filled 2017-02-19: qty 10

## 2017-02-19 MED ORDER — ROCURONIUM BROMIDE 50 MG/5ML IV SOSY
PREFILLED_SYRINGE | INTRAVENOUS | Status: AC
  Filled 2017-02-19: qty 10

## 2017-02-19 MED ORDER — MAGNESIUM HYDROXIDE 400 MG/5ML PO SUSP
30.0000 mL | Freq: Every day | ORAL | Status: DC
  Administered 2017-02-19: 30 mL via ORAL
  Filled 2017-02-19 (×2): qty 30

## 2017-02-19 MED ORDER — INSULIN NPH (HUMAN) (ISOPHANE) 100 UNIT/ML ~~LOC~~ SUSP
36.0000 [IU] | Freq: Two times a day (BID) | SUBCUTANEOUS | Status: DC
  Administered 2017-02-19 – 2017-02-20 (×2): 36 [IU] via SUBCUTANEOUS
  Filled 2017-02-19: qty 10

## 2017-02-19 MED ORDER — ROCURONIUM BROMIDE 10 MG/ML (PF) SYRINGE
PREFILLED_SYRINGE | INTRAVENOUS | Status: DC | PRN
  Administered 2017-02-19: 20 mg via INTRAVENOUS
  Administered 2017-02-19: 50 mg via INTRAVENOUS

## 2017-02-19 MED ORDER — HYDROMORPHONE HCL-NACL 0.5-0.9 MG/ML-% IV SOSY
0.2500 mg | PREFILLED_SYRINGE | INTRAVENOUS | Status: DC | PRN
Start: 2017-02-19 — End: 2017-02-19
  Administered 2017-02-19: 0.25 mg via INTRAVENOUS

## 2017-02-19 MED ORDER — FENTANYL CITRATE (PF) 250 MCG/5ML IJ SOLN
INTRAMUSCULAR | Status: DC | PRN
  Administered 2017-02-19: 150 ug via INTRAVENOUS
  Administered 2017-02-19 (×2): 50 ug via INTRAVENOUS

## 2017-02-19 MED ORDER — PROPOFOL 10 MG/ML IV BOLUS
INTRAVENOUS | Status: AC
  Filled 2017-02-19: qty 20

## 2017-02-19 MED ORDER — FENTANYL CITRATE (PF) 250 MCG/5ML IJ SOLN
INTRAMUSCULAR | Status: AC
  Filled 2017-02-19: qty 5

## 2017-02-19 MED ORDER — ONDANSETRON HCL 4 MG/2ML IJ SOLN: 4.0000 mg | Freq: Once | INTRAMUSCULAR | Status: DC | PRN

## 2017-02-19 MED ORDER — ONDANSETRON HCL 4 MG PO TABS: 4.0000 mg | ORAL_TABLET | Freq: Four times a day (QID) | ORAL | Status: DC | PRN

## 2017-02-19 MED ORDER — IBUPROFEN 800 MG PO TABS: 800.0000 mg | ORAL_TABLET | Freq: Three times a day (TID) | ORAL | Status: DC | PRN

## 2017-02-19 MED ORDER — LIDOCAINE 2% (20 MG/ML) 5 ML SYRINGE
INTRAMUSCULAR | Status: DC | PRN
  Administered 2017-02-19: 100 mg via INTRAVENOUS

## 2017-02-19 MED ORDER — CITALOPRAM HYDROBROMIDE 20 MG PO TABS: 20.0000 mg | ORAL_TABLET | Freq: Every day | ORAL | Status: DC

## 2017-02-19 MED ORDER — DIPHENHYDRAMINE HCL 12.5 MG/5ML PO ELIX
12.5000 mg | ORAL_SOLUTION | Freq: Four times a day (QID) | ORAL | Status: DC | PRN
  Administered 2017-02-19: 12.5 mg via ORAL
  Filled 2017-02-19: qty 5

## 2017-02-19 MED ORDER — PHENYLEPHRINE 40 MCG/ML (10ML) SYRINGE FOR IV PUSH (FOR BLOOD PRESSURE SUPPORT)
PREFILLED_SYRINGE | INTRAVENOUS | Status: DC | PRN
  Administered 2017-02-19 (×4): 80 ug via INTRAVENOUS

## 2017-02-19 MED ORDER — STERILE WATER FOR INJECTION IJ SOLN
INTRAMUSCULAR | Status: DC | PRN
  Administered 2017-02-19: 10 mL

## 2017-02-19 MED ORDER — HYDROMORPHONE HCL 1 MG/ML IJ SOLN
0.2000 mg | INTRAMUSCULAR | Status: DC | PRN
  Administered 2017-02-19: 0.2 mg via INTRAVENOUS
  Filled 2017-02-19: qty 1

## 2017-02-19 MED ORDER — EPHEDRINE 5 MG/ML INJ
INTRAVENOUS | Status: AC
  Filled 2017-02-19: qty 10

## 2017-02-19 MED ORDER — LIDOCAINE 2% (20 MG/ML) 5 ML SYRINGE
INTRAMUSCULAR | Status: AC
  Filled 2017-02-19: qty 5

## 2017-02-19 MED ORDER — SUGAMMADEX SODIUM 200 MG/2ML IV SOLN
INTRAVENOUS | Status: DC | PRN
  Administered 2017-02-19: 500 mg via INTRAVENOUS

## 2017-02-19 MED ORDER — DEXAMETHASONE SODIUM PHOSPHATE 10 MG/ML IJ SOLN
INTRAMUSCULAR | Status: AC
  Filled 2017-02-19: qty 1

## 2017-02-19 MED ORDER — PROPOFOL 10 MG/ML IV BOLUS
INTRAVENOUS | Status: DC | PRN
  Administered 2017-02-19: 110 mg via INTRAVENOUS

## 2017-02-19 MED ORDER — LACTATED RINGERS IV SOLN
INTRAVENOUS | Status: DC | PRN
  Administered 2017-02-19 (×2): via INTRAVENOUS

## 2017-02-19 MED ORDER — OXYCODONE-ACETAMINOPHEN 5-325 MG PO TABS: 1.0000 | ORAL_TABLET | ORAL | Status: DC | PRN

## 2017-02-19 MED ORDER — LACTATED RINGERS IR SOLN
Status: DC | PRN
  Administered 2017-02-19: 1000 mL

## 2017-02-19 MED ORDER — ONDANSETRON HCL 4 MG/2ML IJ SOLN
INTRAMUSCULAR | Status: AC
  Filled 2017-02-19: qty 2

## 2017-02-19 MED ORDER — KCL IN DEXTROSE-NACL 20-5-0.45 MEQ/L-%-% IV SOLN
INTRAVENOUS | Status: DC
  Administered 2017-02-19 (×2): via INTRAVENOUS
  Filled 2017-02-19 (×2): qty 1000

## 2017-02-19 SURGICAL SUPPLY — 63 items
ADH SKN CLS APL DERMABOND .7 (GAUZE/BANDAGES/DRESSINGS) ×1
AGENT HMST KT MTR STRL THRMB (HEMOSTASIS)
APL ESCP 34 STRL LF DISP (HEMOSTASIS)
APPLICATOR SURGIFLO ENDO (HEMOSTASIS) IMPLANT
BAG LAPAROSCOPIC 12 15 PORT 16 (BASKET) IMPLANT
BAG RETRIEVAL 12/15 (BASKET)
BAG SPEC RTRVL LRG 6X4 10 (ENDOMECHANICALS)
COVER BACK TABLE 60X90IN (DRAPES) ×2 IMPLANT
COVER TIP SHEARS 8 DVNC (MISCELLANEOUS) ×1 IMPLANT
COVER TIP SHEARS 8MM DA VINCI (MISCELLANEOUS) ×1
DERMABOND ADVANCED (GAUZE/BANDAGES/DRESSINGS) ×1
DERMABOND ADVANCED .7 DNX12 (GAUZE/BANDAGES/DRESSINGS) IMPLANT
DRAPE ARM DVNC X/XI (DISPOSABLE) ×4 IMPLANT
DRAPE COLUMN DVNC XI (DISPOSABLE) ×1 IMPLANT
DRAPE DA VINCI XI ARM (DISPOSABLE) ×4
DRAPE DA VINCI XI COLUMN (DISPOSABLE) ×1
DRAPE SHEET LG 3/4 BI-LAMINATE (DRAPES) ×2 IMPLANT
DRAPE SURG IRRIG POUCH 19X23 (DRAPES) ×2 IMPLANT
ELECT REM PT RETURN 15FT ADLT (MISCELLANEOUS) ×2 IMPLANT
GAUZE SPONGE 2X2 8PLY STRL LF (GAUZE/BANDAGES/DRESSINGS) IMPLANT
GLOVE BIO SURGEON STRL SZ 6 (GLOVE) ×4 IMPLANT
GLOVE BIO SURGEON STRL SZ 6.5 (GLOVE) ×2 IMPLANT
GLOVE BIOGEL PI IND STRL 7.0 (GLOVE) IMPLANT
GLOVE BIOGEL PI IND STRL 7.5 (GLOVE) IMPLANT
GLOVE BIOGEL PI INDICATOR 7.0 (GLOVE) ×3
GLOVE BIOGEL PI INDICATOR 7.5 (GLOVE) ×1
GLOVE SURG SS PI 6.0 STRL IVOR (GLOVE) ×4 IMPLANT
GLOVE SURG SS PI 6.5 STRL IVOR (GLOVE) ×3 IMPLANT
GOWN STRL REUS W/ TWL LRG LVL3 (GOWN DISPOSABLE) ×2 IMPLANT
GOWN STRL REUS W/TWL LRG LVL3 (GOWN DISPOSABLE) ×9 IMPLANT
HOLDER FOLEY CATH W/STRAP (MISCELLANEOUS) ×2 IMPLANT
IRRIG SUCT STRYKERFLOW 2 WTIP (MISCELLANEOUS) ×2
IRRIGATION SUCT STRKRFLW 2 WTP (MISCELLANEOUS) ×1 IMPLANT
KIT PROCEDURE DA VINCI SI (MISCELLANEOUS) ×1
KIT PROCEDURE DVNC SI (MISCELLANEOUS) IMPLANT
MANIPULATOR UTERINE 4.5 ZUMI (MISCELLANEOUS) ×2 IMPLANT
NDL SAFETY ECLIPSE 18X1.5 (NEEDLE) ×1 IMPLANT
NDL SPNL 18GX3.5 QUINCKE PK (NEEDLE) ×1 IMPLANT
NEEDLE HYPO 18GX1.5 SHARP (NEEDLE) ×2
NEEDLE SPNL 18GX3.5 QUINCKE PK (NEEDLE) ×2 IMPLANT
OBTURATOR OPTICAL STANDARD 8MM (TROCAR) ×1
OBTURATOR OPTICAL STND 8 DVNC (TROCAR) ×1
OBTURATOR OPTICALSTD 8 DVNC (TROCAR) ×1 IMPLANT
PACK ROBOT GYN CUSTOM WL (TRAY / TRAY PROCEDURE) ×2 IMPLANT
PAD POSITIONING PINK XL (MISCELLANEOUS) ×2 IMPLANT
POUCH SPECIMEN RETRIEVAL 10MM (ENDOMECHANICALS) IMPLANT
SEAL CANN UNIV 5-8 DVNC XI (MISCELLANEOUS) ×4 IMPLANT
SEAL XI 5MM-8MM UNIVERSAL (MISCELLANEOUS) ×4
SEALER VESSEL DA VINCI XI (MISCELLANEOUS) ×1
SEALER VESSEL EXT DVNC XI (MISCELLANEOUS) IMPLANT
SET TRI-LUMEN FLTR TB AIRSEAL (TUBING) ×2 IMPLANT
SOLUTION ELECTROLUBE (MISCELLANEOUS) ×1 IMPLANT
SPONGE GAUZE 2X2 STER 10/PKG (GAUZE/BANDAGES/DRESSINGS) ×1
SURGIFLO W/THROMBIN 8M KIT (HEMOSTASIS) IMPLANT
SUT VIC AB 0 CT1 27 (SUTURE)
SUT VIC AB 0 CT1 27XBRD ANTBC (SUTURE) IMPLANT
SYR 10ML LL (SYRINGE) ×2 IMPLANT
TOWEL OR NON WOVEN STRL DISP B (DISPOSABLE) ×2 IMPLANT
TRAP SPECIMEN MUCOUS 40CC (MISCELLANEOUS) IMPLANT
TRAY FOLEY BAG SILVER LF 16FR (CATHETERS) ×1 IMPLANT
TRAY FOLEY W/METER SILVER 16FR (SET/KITS/TRAYS/PACK) ×1 IMPLANT
UNDERPAD 30X30 (UNDERPADS AND DIAPERS) ×3 IMPLANT
WATER STERILE IRR 1000ML POUR (IV SOLUTION) ×2 IMPLANT

## 2017-02-19 NOTE — Anesthesia Postprocedure Evaluation (Signed)
Anesthesia Post Note  Patient: Darylene Price [place]  Procedure(s) Performed: XI ROBOTIC ASSISTED TOTAL HYSTERECTOMY WITH BILATERAL SALPINGO OOPHORECTOMY (N/A ) SENTINEL NODE BIOPSY (N/A )     Patient location during evaluation: PACU Anesthesia Type: General Level of consciousness: awake and alert Pain management: pain level controlled Vital Signs Assessment: post-procedure vital signs reviewed and stable Respiratory status: spontaneous breathing, nonlabored ventilation, respiratory function stable and patient connected to nasal cannula oxygen Cardiovascular status: blood pressure returned to baseline and stable Postop Assessment: no apparent nausea or vomiting Anesthetic complications: no    Last Vitals:  Vitals:   02/19/17 0945 02/19/17 1000  BP: (!) 162/42 (!) 168/37  Pulse: 60 60  Resp: 15 15  Temp:    SpO2: 100% 100%    Last Pain:  Vitals:   02/19/17 1000  TempSrc:   PainSc: 4                  Virjean Boman DAVID

## 2017-02-19 NOTE — Anesthesia Procedure Notes (Signed)
Procedure Name: Intubation Date/Time: 02/19/2017 7:38 AM Performed by: Talbot Grumbling Pre-anesthesia Checklist: Patient identified, Emergency Drugs available, Suction available and Patient being monitored Patient Re-evaluated:Patient Re-evaluated prior to induction Oxygen Delivery Method: Circle system utilized Preoxygenation: Pre-oxygenation with 100% oxygen Induction Type: IV induction Ventilation: Mask ventilation without difficulty Laryngoscope Size: Mac and 3 Grade View: Grade III Tube type: Oral Tube size: 7.5 mm Number of attempts: 1 Airway Equipment and Method: Stylet

## 2017-02-19 NOTE — Transfer of Care (Signed)
Immediate Anesthesia Transfer of Care Note  Patient: Alicia [name]  Procedure(s) Performed: Procedure(s): XI ROBOTIC ASSISTED TOTAL HYSTERECTOMY WITH BILATERAL SALPINGO OOPHORECTOMY (N/A) SENTINEL NODE BIOPSY (N/A)  Patient Location: PACU  Anesthesia Type:General  Level of Consciousness:  sedated, patient cooperative and responds to stimulation  Airway & Oxygen Therapy:Patient Spontanous Breathing and Patient connected to face mask oxgen  Post-op Assessment:  Report given to PACU RN and Post -op Vital signs reviewed and stable  Post vital signs:  Reviewed and stable  Last Vitals:  Vitals:   02/19/17 0520  BP: (!) 133/50  Pulse: 65  Resp: 16  Temp: 36.6 C  SpO2: 496%    Complications: No apparent anesthesia complications

## 2017-02-19 NOTE — H&P (Signed)
CC:     Chief Complaint  Patient presents with  . Endometrial cancer [facility])    HPI: Patient is seen today in consultation at the request of Dr. [name].  Patient is a very pleasant 71 year old gravida 2 para 2 has been menopausal since her mid-40s. She's never took any hormone replacement therapy. She had an episode of bright red bleeding which then turned to brown in July 2018. She states she was seen by her primary care provider who performed a Pap smear. She does not know what those results are. She was then referred to Dr. [name]. She underwent a pelvic ultrasound on 12/17/2016. It revealed the uterus to be 9.8 x 4.9 x 6.1 cm with an anterior subserosal fibroid measuring 4 x 3 x 2.8 cm. The endometrium was 1.59 cm. The adnexa were unremarkable. She subsequently underwent an endometrial biopsy on August 27. She had a benign endocervical polyp. 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 Is for this reason that she is referred to [place] today.  She is otherwise in her usual state of health. She states she is always cold and relates that secondary to her thyroid disease. She states last night she will cup sweating and what she describes is disoriented. She checked her sugar and her blood pressure. She states her blood pressure was okay and checked her sugar which was 56. She then took glucose pills that she had at home and a repair. Her Accu-Cheks went from 34 then to 13 and she started feeling better. This morning her blood sugar was 96. Her last A1c was 6.3. She states that usually her sugars run from 110 220. This is a first time that she has had to take sugar pills at home. She states she's had one other episode of what she describes as hypoglycemia at a restaurant and she felt better after drinking orange juice. After she wasn't sure that was related to her atrial fibrillation she felt a little bit nauseated and occasionally with her atrial fibrillation she will. Slightly  nauseated.  She denies any chest pain or shortness of breath. She had a pacemaker placed about 6 months after that began experiencing a defibrillation. She's not sure when her last A. fib episode was. She's never had a cardiac catheterization. Her cardiologist is Dr. [name] and she saw him in the summer. She does endorse some lower extremity edema. She is on hydrochlorothiazide but she last took it on Saturday she needs to get her prescription refilled. Her last mammogram was 3 years ago. She has not had once she had a pacemaker placed for fear that it would hurt. Her last colonoscopy was in 2003.  Her father's health is notable for head and neck cancer he was a heavy smoker and drink. He certainly died at the age of 15 from a tongue cancer. Her mother died of a stroke. All of her mother's siblings except one died of cancer. This includes men and women. She does not know the types.  Review of Systems: Constitutional: Feels cold all the time, this is no change Skin: No rash Cardiovascular: No chest pain, shortness of breath, + LE edema as she has not taken her Lasix and Saturday Pulmonary: No cough or wheeze.  Gastro Intestinal:  No nausea, vomiting, constipation, or diarrhea reported. No bright red blood per rectum or change in bowel movement.  Genitourinary: No frequency, urgency, or dysuria.  + vaginal bleeding and discharge.  Musculoskeletal: + Bilateral knee pain. She states that she  needs injections per mail in her right knee. She had arthroscopic knee surgery that "did not work" as she took a blood thinner and had a "hemorrhage" into her knee.  Neurologic: No weakness Psychology: No changes  Current Meds:      Outpatient Encounter Prescriptions as of 01/16/2017  Medication Sig  . apixaban (ELIQUIS) 5 MG TABS tablet Take 1 tablet (5 mg total) by mouth 2 (two) times daily. PT NEEDS APPT, CALL [phone] TO SCHEDULE, 1ST ATTEMPT  . flecainide (TAMBOCOR) 100 MG tablet Take 3 tablets (300  mg total) by mouth once. AS NEEDED  . hydrochlorothiazide (HYDRODIURIL) 25 MG tablet Take 25 mg by mouth daily.   . insulin NPH-regular Human (NOVOLIN 70/30) (70-30) 100 UNIT/ML injection Inject 48 Units into the skin 2 (two) times daily with a meal.   . INSULIN SYRINGE 1CC/29G 29G X 1/2" 1 ML MISC   . levothyroxine (SYNTHROID) 112 MCG tablet Take 1 tablet (112 mcg total) by mouth daily before breakfast.  . levothyroxine (SYNTHROID, LEVOTHROID) 100 MCG tablet   . Multiple Vitamins-Minerals (ICAPS) CAPS Take 1 capsule by mouth daily.   [name] (SYSTANE) 0.4-0.3 % SOLN Place 1 drop into both eyes as needed (dry eyes).  . Probiotic Product (PROBIOTIC DAILY PO) Take 1 tablet by mouth daily.   No facility-administered encounter medications on file as of 01/16/2017.     Allergy:       Allergies  Allergen Reactions  . Ace Inhibitors Cough  . Cardizem [Diltiazem Hcl] Other (See Comments)    headache  . Oxycodone Nausea Only  . Statins Other (See Comments)    myalgias  . Sulfa Antibiotics Hives and Swelling  . Tape     Paper tape is okay  . Cortisone Rash    flushing  . Latex Rash    Sensitive not allergic  . Tartrazine Rash    Social Hx:   Social History        Social History  . Marital status: Married    Spouse name: N/A  . Number of children: N/A  . Years of education: N/A      Occupational History  . Not on file.        Social History Main Topics  . Smoking status: Never Smoker  . Smokeless tobacco: Never Used  . Alcohol use No  . Drug use: No  . Sexual activity: Not Currently    Birth control/ protection: Post-menopausal       Other Topics Concern  . Not on file      Social History Narrative   Retired and lives in [place] with spouse    Past Surgical Hx:       Past Surgical History:  Procedure Laterality Date  . ANKLE SURGERY Right    multiple  . BREAST SURGERY     biopsy  . CHOLECYSTECTOMY     . COLONOSCOPY    . EYE SURGERY     both cataracts  . FOOT ARTHROTOMY     left  . KNEE SURGERY     right  . 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 PERMANENT PACEMAKER INSERTION N/A 12/15/2012   SJM 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 by Dr Lovena Le for transient AV block  . SHOULDER ARTHROSCOPY WITH SUBACROMIAL DECOMPRESSION AND OPEN ROTATOR C Right 07/07/2013   Procedure: RIGHT SHOULDER ARTHROSCOPY WITH SUBACROMIAL DECOMPRESSION AND ROTATOR CUFF DEBRIDEMENT/ DISTAL  CLAVICLE RESECTION;  Surgeon: [name], MD;  Location: [place];  Service: Orthopedics;  Laterality: Right;  . SHOULDER SURGERY Right 2012    Past Medical Hx:      Past Medical History:  Diagnosis Date  . Anemia   . Diabetes ([place])   . DJD (degenerative joint disease)   . Dysrhythmia    SYMPTOMATIC BRADYCARDIA  . Hypertension    off meds at this time  . Hypothyroidism   . Obesity   . 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 Wears glasses     Oncology Hx:        Endometrial ca [facility])   12/31/2016 Initial Diagnosis    Endometrial ca [facility])       Family Hx:       Family History  Problem Relation Age of Onset  . Sudden death Brother        age 21s, died while playing raquetball  . Stroke Mother   . Cancer Father        throat  . Allergic rhinitis Neg Hx   . Angioedema Neg Hx   . Asthma Neg Hx   . Atopy Neg Hx   . Eczema Neg Hx   . Immunodeficiency Neg Hx   . Urticaria Neg Hx     Vitals:  Blood pressure (!) 178/78, pulse 63, temperature 97.8 F (36.6 C), temperature source Oral, resp. rate 18, weight 249 lb 3.2 oz (113 kg), SpO2 98 %.  Physical Exam: Well-nourished older white female in no acute distress.  Neck: Supple, no lymphadenopathy, no thyromegaly.  Lungs: Clear to auscultation bilaterally.  Cardiac: Regular rate and  rhythm. No murmur.  Abdomen: Obese well-healed laparoscopic incisions. Abdomen is soft, nontender, nondistended. There are no palpable masses or hepatomegaly. Exam is somewhat limited by habitus.  Groins: No lymphadenopathy.  Extremities: 2+ pitting edema equal bilaterally in the lower extremities. No calf tenderness.  Pelvic: External genitalia is within normal limits. Vagina is atrophic. The cervix is visualized. His multiparous. There are no visible lesions there is no discharge. Bimanual dissemination [place] corpus is slightly globular but not markedly enlarged. The uterus is of normal size shape and consistency. There are no adnexal masses. Rectal confirms.  Assessment/Plan: 71 year old with a clinical stage I grade 2 endometrial carcinoma. Lengthy discussion today was had with the patient and her husband regarding surgical options.  We discussed the role of surgery including minimally invasive surgery versus abdominal hysterectomy. We discussed that minimally invasive surgery would allow faster recovery, less pain and she is willing to proceed with that. She has previously undergone a laparoscopic cholecystectomy she was very happy to hear that we would be able to perform her hysterectomy in the same minimally invasive fashion. I did discuss with her that we use a robotic platform. Her questions regarding minimally invasive surgery were elicited in answer to her satisfaction. We also discussed the role of sentinel lymph node removal versus complete lymphadenectomy. She understands that should she not map to one or both sides will need to proceed with a complete lymphadenectomy on both sides.  She was offered a surgery date of next week however the patient has a church function that she would like to attend from September 26 to the 29th. She will like to defer surgery until after that time. I do not think that it terribly unreasonable as it would really only delay her surgery by 10 days. At  this  time she scheduled for robotic hysterectomy, bilateral salpingo-oophorectomy and bilateral sentinel lymph node dissection with possible lymphadenectomy for 02/05/2017 with Dr. Everitt Amber. Risks of surgery including but not limited to bleeding, infection, injury to surrounding organs and thromboembolic disease were discussed.  With regards to her atrial fibrillation, her CHADS2 score is 4 based on diabetes, hypertension, gender and age. With this she has approximately 5% risk of stroke per year. We have a message into Dr. [name] regarding the timing of stopping her anticoagulation around whether or not she needs to be bridged around the time of surgery. We will notify the patient once we hear back from him regarding his recommendations.  The patient and her husband's questions were elicited in answer to her satisfaction. On a social note, she and her husband play Mr. and Mrs. Claus around the holidays.

## 2017-02-19 NOTE — Telephone Encounter (Signed)
Scheduled post op appt for patient. Patient to receive appt with her discharge summary

## 2017-02-19 NOTE — Anesthesia Preprocedure Evaluation (Signed)
Anesthesia Evaluation  Patient identified by MRN, date of birth, ID band Patient awake    Reviewed: Allergy & Precautions  Airway Mallampati: II  TM Distance: >3 FB Neck ROM: Full    Dental   Pulmonary sleep apnea ,    Pulmonary exam normal        Cardiovascular hypertension, Pt. on medications Normal cardiovascular exam+ dysrhythmias Atrial Fibrillation + pacemaker      Neuro/Psych Anxiety    GI/Hepatic   Endo/Other  diabetes, Type 2, Insulin Dependent  Renal/GU      Musculoskeletal   Abdominal   Peds  Hematology   Anesthesia Other Findings   Reproductive/Obstetrics                             Anesthesia Physical Anesthesia Plan  ASA: III  Anesthesia Plan: General   Post-op Pain Management:    Induction: Intravenous  PONV Risk Score and Plan: 3 and Ondansetron, Midazolam and Treatment may vary due to age or medical condition  Airway Management Planned: Oral ETT  Additional Equipment:   Intra-op Plan:   Post-operative Plan: Extubation in OR  Informed Consent: I have reviewed the patients History and Physical, chart, labs and discussed the procedure including the risks, benefits and alternatives for the proposed anesthesia with the patient or authorized representative who has indicated his/her understanding and acceptance.     Plan Discussed with: CRNA and Surgeon  Anesthesia Plan Comments:         Anesthesia Quick Evaluation

## 2017-02-19 NOTE — Progress Notes (Signed)
Call placed to Dr. Everitt Amber to report angioma noted on the corner of the upper lip approximately measures 1 cm. Received order.

## 2017-02-19 NOTE — Op Note (Signed)
OPERATIVE NOTE 02/19/17  Surgeon: Donaciano Eva   Assistants: Dr Lahoma Crocker (an MD assistant was necessary for tissue manipulation, management of robotic instrumentation, retraction and positioning due to the complexity of the case and hospital policies).   Anesthesia: General endotracheal anesthesia  ASA Class: 3   Pre-operative Diagnosis: endometrial cancer grade 2  Post-operative Diagnosis: same3  Operation: Robotic-assisted laparoscopic total hysterectomy with bilateral salpingoophorectomy, SLN biopsy   Surgeon: Donaciano Eva  Assistant Surgeon: Lahoma Crocker MD  Anesthesia: GET  Urine Output: 300  Operative Findings:  : 8cm uterus with fundal 4cm fibroid. Unilateral mapping on right. No suspicious nodes.  Estimated Blood Loss:  <20      Total IV Fluids: 500 ml         Specimens: uterus, cervix, bilateral tubes and ovaries, right external iliac SLN, left pelvic lymph nodes         Complications:  None; patient tolerated the procedure well.         Disposition: PACU - hemodynamically stable.  Procedure Details  The patient was seen in the Holding Room. The risks, benefits, complications, treatment options, and expected outcomes were discussed with the patient.  The patient concurred with the proposed plan, giving informed consent.  The site of surgery properly noted/marked. The patient was identified as Alicia [name] and the procedure verified as a Robotic-assisted hysterectomy with bilateral salpingo oophorectomy with SLN biopsy. A Time Out was held and the above information confirmed.  After induction of anesthesia, the patient was draped and prepped in the usual sterile manner. Pt was placed in supine position after anesthesia and draped and prepped in the usual sterile manner. The abdominal drape was placed after the CholoraPrep had been allowed to dry for 3 minutes.  Her arms were tucked to her side with all appropriate precautions.  The  shoulders were stabilized with padded shoulder blocks applied to the acromium processes.  The patient was placed in the semi-lithotomy position in [place].  The perineum was prepped with Betadine. The patient was then prepped. Foley catheter was placed.  A sterile speculum was placed in the vagina.  The cervix was grasped with a single-tooth tenaculum. 2mg  total of ICG was injected into the cervical stroma at 2 and 9 o'clock with 1cc injected at a 1cm and 71mm depth (concentration 0.5mg /ml) in all locations. The cervix was dilated with Kennon Rounds dilators.  The ZUMI uterine manipulator with a medium colpotomizer ring was placed without difficulty.  A pneum occluder balloon was placed over the manipulator.  OG tube placement was confirmed and to suction.   Next, a 5 mm skin incision was made 1 cm below the subcostal margin in the midclavicular line.  The 5 mm Optiview port and scope was used for direct entry.  Opening pressure was under 10 mm CO2.  During entry, the lesser sac was entered. The abdomen was insufflated and the findings were noted as above.   At this point and all points during the procedure, the patient's intra-abdominal pressure did not exceed 15 mmHg. Next, a 10 mm skin incision was made in the umbilicus and a right and left port was placed about 10 cm lateral to the robot port on the right and left side.  A fourth arm was placed in the left lower quadrant 2 cm above and superior and medial to the anterior superior iliac spine.  All ports were placed under direct visualization. The colon and stomach and omentum were closely inspected to  insure no trauma had occurred during entry. The patient was placed in steep Trendelenburg.  Bowel was folded away into the upper abdomen.  The robot was docked in the normal manner.  The right and left peritoneum were opened parallel to the IP ligament to open the retroperitoneal spaces bilaterally. The SLN mapping was performed in bilateral pelvic basins. The  para rectal and paravesical spaces were opened up entirely with careful dissection below the level of the ureters bilaterally and to the depth of the uterine artery origin in order to skeletonize the uterine "web" and ensure visualization of all parametrial channels. The para-aortic basins were carefully exposed and evaluated for isolated para-aortic SLN's. Lymphatic channels were identified travelling to the following visualized sentinel lymph node's: right external iliac. These SLN's were separated from their surrounding lymphatic tissue, removed and sent for permanent pathology.  Due to unilateral mapping a left sided pelvic lymphadenectomy was performed. The paravesical space was developed with monopolar and sharp dissection. It was held open with tension on the median umbilical ligament with the forth arm. The paravesical space was opened with blunt and sharp dissection to mobilize the ureter off of the medial surface of the internal iliac artery. The medial leaf of the broad ligament containing the ureter was held medially (opening the pararectal space) by the assistant's grasper. The left pelvic lymphadenectomy was performed by skeletonizing the internal iliac artery at the bifurcation with the external iliac artery. The obturator nerve was identified in the base of lateral paravesical space. The ureter was mobilized medially off of the dissection by developing the pararectal space. The genitofemoral nerve was identified, skeletonized and mobilized laterally off of the external iliac artery. An enbloc resection of lymph nodes was performed within the following boundaries: the mid portion of the common iliac proximally, the circumflex iliac vein distally, the obturator nerve posteriorally, the genitofemoral nerve laterally. The nodal basin (including obturator space) were confirmed to be empty of nodes and hemostatic. The nodes were placed in an endocatch bag and retrieved vaginally.  The hysterectomy was  started after the round ligament on the right side was incised and the retroperitoneum was entered and the pararectal space was developed.  The ureter was noted to be on the medial leaf of the broad ligament.  The peritoneum above the ureter was incised and stretched and the infundibulopelvic ligament was skeletonized, cauterized and cut.  The posterior peritoneum was taken down to the level of the KOH ring.  The anterior peritoneum was also taken down.  The bladder flap was created to the level of the KOH ring.  The uterine artery on the right side was skeletonized, cauterized and cut in the normal manner.  A similar procedure was performed on the left.  The colpotomy was made and the uterus, cervix, bilateral ovaries and tubes were amputated and delivered through the vagina.  Pedicles were inspected and excellent hemostasis was achieved.    The colpotomy at the vaginal cuff was closed with Vicryl on a CT1 needle in a running manner.  Irrigation was used and excellent hemostasis was achieved.  At this point in the procedure was completed.  Robotic instruments were removed under direct visulaization.  The robot was undocked. The 10 mm ports were closed with Vicryl on a UR-5 needle and the fascia was closed with 0 Vicryl on a UR-5 needle.  The skin was closed with 4-0 Vicryl in a subcuticular manner.  Dermabond was applied.  Sponge, lap and needle counts correct x 2.  The patient was taken to the recovery room in stable condition.  The vagina was swabbed with  minimal bleeding noted.   All instrument and needle counts were correct x  3.   The patient was transferred to the recovery room in a stable condition.  Donaciano Eva, MD

## 2017-02-20 ENCOUNTER — Encounter (HOSPITAL_COMMUNITY): Payer: Self-pay | Admitting: Gynecologic Oncology

## 2017-02-20 DIAGNOSIS — D271 Benign neoplasm of left ovary: Secondary | ICD-10-CM | POA: Diagnosis not present

## 2017-02-20 DIAGNOSIS — C541 Malignant neoplasm of endometrium: Secondary | ICD-10-CM | POA: Diagnosis not present

## 2017-02-20 DIAGNOSIS — D252 Subserosal leiomyoma of uterus: Secondary | ICD-10-CM | POA: Diagnosis not present

## 2017-02-20 DIAGNOSIS — C542 Malignant neoplasm of myometrium: Secondary | ICD-10-CM | POA: Diagnosis not present

## 2017-02-20 DIAGNOSIS — Z7901 Long term (current) use of anticoagulants: Secondary | ICD-10-CM | POA: Diagnosis not present

## 2017-02-20 DIAGNOSIS — D270 Benign neoplasm of right ovary: Secondary | ICD-10-CM | POA: Diagnosis not present

## 2017-02-20 LAB — BASIC METABOLIC PANEL
Anion gap: 8 (ref 5–15)
BUN: 14 mg/dL (ref 6–20)
CO2: 30 mmol/L (ref 22–32)
Calcium: 8.3 mg/dL — ABNORMAL LOW (ref 8.9–10.3)
Chloride: 100 mmol/L — ABNORMAL LOW (ref 101–111)
Creatinine, Ser: 1.1 mg/dL — ABNORMAL HIGH (ref 0.44–1.00)
GFR calc Af Amer: 57 mL/min — ABNORMAL LOW (ref >60)
GFR, EST NON AFRICAN AMERICAN: 49 mL/min — AB (ref >60)
GLUCOSE: 161 mg/dL — AB (ref 65–99)
Potassium: 4.1 mmol/L (ref 3.5–5.1)
SODIUM: 138 mmol/L (ref 135–145)

## 2017-02-20 LAB — CBC
HCT: 35.4 % — ABNORMAL LOW (ref 36.0–46.0)
Hemoglobin: 11.5 g/dL — ABNORMAL LOW (ref 12.0–15.0)
MCH: 30.6 pg (ref 26.0–34.0)
MCHC: 32.5 g/dL (ref 30.0–36.0)
MCV: 94.1 fL (ref 78.0–100.0)
PLATELETS: 234 10*3/uL (ref 150–400)
RBC: 3.76 MIL/uL — AB (ref 3.87–5.11)
RDW: 13.2 % (ref 11.5–15.5)
WBC: 7.8 10*3/uL (ref 4.0–10.5)

## 2017-02-20 LAB — GLUCOSE, CAPILLARY: Glucose-Capillary: 146 mg/dL — ABNORMAL HIGH (ref 65–99)

## 2017-02-20 MED ORDER — TRAMADOL HCL 50 MG PO TABS: 50.0000 mg | ORAL_TABLET | Freq: Four times a day (QID) | ORAL | 0 refills | Status: DC | PRN

## 2017-02-20 MED ORDER — APIXABAN 5 MG PO TABS: 5.0000 mg | ORAL_TABLET | Freq: Two times a day (BID) | ORAL | 0 refills | Status: DC

## 2017-02-20 NOTE — Discharge Summary (Signed)
Physician Discharge Summary  Patient ID: Alicia [name] MRN: [id number] DOB/AGE: 1946-02-06 71 y.o.  Admit date: 02/19/2017 Discharge date: 02/20/2017  Admission Diagnoses: Endometrial ca [facility])  Discharge Diagnoses:  Principal Problem:   Endometrial ca [facility]) Active Problems:   Endometrial cancer [facility])   Discharged Condition:  The patient is in good condition and stable for discharge.    Hospital Course: On 02/19/2017, the patient underwent the following: Procedure(s): XI ROBOTIC ASSISTED TOTAL HYSTERECTOMY WITH BILATERAL SALPINGO OOPHORECTOMY SENTINEL NODE BIOPSY.  The postoperative course was uneventful.  She was discharged to home on postoperative day 1 tolerating a regular diet, ambulating, voiding, pain controlled, having bowel movements/passing flatus.  She is to resume her Eliquis tomorrow per Dr. Delsa Sale.  Consults: None  Significant Diagnostic Studies: None  Treatments: surgery: see above  Discharge Exam: Blood pressure (!) 134/49, pulse 74, temperature 98 F (36.7 C), temperature source Oral, resp. rate 16, height 5\' 6"  (1.676 m), weight 240 lb (108.9 kg), SpO2 98 %. General appearance: alert, cooperative and no distress Resp: clear to auscultation bilaterally Cardio: regular rate and rhythm, S1, S2 normal, no murmur, click, rub or gallop GI: soft, non-tender; bowel sounds normal; no masses,  no organomegaly and abdomen obese Extremities: extremities normal, atraumatic, no cyanosis or edema Incision/Wound: Lap sites to the abdomen with dermabond without drainage or erythema  Disposition: 01-Home or Self Care  Discharge Instructions    Call MD for:  difficulty breathing, headache or visual disturbances    Complete by:  As directed    Call MD for:  extreme fatigue    Complete by:  As directed    Call MD for:  hives    Complete by:  As directed    Call MD for:  persistant dizziness or light-headedness    Complete by:  As directed    Call MD for:  persistant  nausea and vomiting    Complete by:  As directed    Call MD for:  redness, tenderness, or signs of infection (pain, swelling, redness, odor or green/yellow discharge around incision site)    Complete by:  As directed    Call MD for:  severe uncontrolled pain    Complete by:  As directed    Call MD for:  temperature >100.4    Complete by:  As directed    Diet - low sodium heart healthy    Complete by:  As directed    Discharge instructions    Complete by:  As directed    Resume Eliquis tomorrow, Oct 18   Driving Restrictions    Complete by:  As directed    No driving for 1 week.  Do not take narcotics and drive.   Increase activity slowly    Complete by:  As directed    Lifting restrictions    Complete by:  As directed    No lifting greater than 10 lbs.   Sexual Activity Restrictions    Complete by:  As directed    No sexual activity, nothing in the vagina, for 8 weeks.     Allergies as of 02/20/2017      Reactions   Ace Inhibitors Cough   Cardizem [diltiazem Hcl] Other (See Comments)   headache   Oxycodone Nausea Only   Statins Other (See Comments)   myalgias   Sulfa Antibiotics Hives, Swelling   Tape    Paper tape is okay   Cortisone Rash   flushing   Latex Rash   Sensitive not  allergic   Tartrazine Rash      Medication List    TAKE these medications   ANTI-ITCH MEDICATION EX Apply 1 application topically 2 (two) times daily as needed (Itching, hives). DerMend Steroid Free   apixaban 5 MG Tabs tablet Commonly known as:  ELIQUIS Take 1 tablet (5 mg total) by mouth 2 (two) times daily. PT NEEDS APPT, CALL [phone] TO SCHEDULE, 1ST ATTEMPT   citalopram 20 MG tablet Commonly known as:  CELEXA Take 20 mg by mouth daily.   diltiazem 30 MG tablet Commonly known as:  CARDIZEM Take 30 mg by mouth See admin instructions. Pt to take as needed if going into AFIB   ESSIAC TONIC Caps Take 2 capsules by mouth 2 (two) times daily.   flecainide 100 MG  tablet Commonly known as:  TAMBOCOR Take 3 tablets (300 mg total) by mouth once. AS NEEDED What changed:  when to take this  additional instructions   hydrochlorothiazide 25 MG tablet Commonly known as:  HYDRODIURIL Take 25 mg by mouth daily.   ICAPS Caps Take 1 capsule by mouth daily.   insulin NPH-regular Human (70-30) 100 UNIT/ML injection Commonly known as:  NOVOLIN 70/30 Inject 48 Units into the skin 2 (two) times daily with a meal. Pt takes 48 units every morning, and 48 units every evening   INSULIN SYRINGE 1CC/29G 29G X 1/2" 1 ML Misc   levothyroxine 112 MCG tablet Commonly known as:  SYNTHROID Take 1 tablet (112 mcg total) by mouth daily before breakfast.   losartan 100 MG tablet Commonly known as:  COZAAR Take 100 mg by mouth daily.   PROBIOTIC DAILY PO Take 1 tablet by mouth daily.   SYSTANE 0.4-0.3 % Soln Generic drug:  Polyethyl Glycol-Propyl Glycol Place 1 drop into both eyes daily as needed (dry eyes).   traMADol 50 MG tablet Commonly known as:  ULTRAM Take 1 tablet (50 mg total) by mouth every 6 (six) hours as needed.        Greater than thirty minutes were spend for face to face discharge instructions and discharge orders/summary in EPIC.   Signed: CROSS, MELISSA DEAL 02/20/2017, 9:44 AM

## 2017-02-20 NOTE — Discharge Instructions (Signed)
02/19/2017  Return to work: 4 weeks  Activity: 1. Be up and out of the bed during the day.  Take a nap if needed.  You may walk up steps but be careful and use the hand rail.  Stair climbing will tire you more than you think, you may need to stop part way and rest.   2. No lifting or straining for 6 weeks.  3. No driving for 1 weeks.  Do Not drive if you are taking narcotic pain medicine.  4. Shower daily.  Use soap and water on your incision and pat dry; don't rub.   5. No sexual activity and nothing in the vagina for 8 weeks.  Medications:  - Take ibuprofen and tylenol first line for pain control. Take these regularly (every 6 hours) to decrease the build up of pain.  - If necessary, for severe pain not relieved by ibuprofen, take percocet.  - While taking percocet you should take sennakot every night to reduce the likelihood of constipation. If this causes diarrhea, stop its use.  Diet: 1. Low sodium Heart Healthy Diet is recommended.  2. It is safe to use a laxative if you have difficulty moving your bowels.   Wound Care: 1. Keep clean and dry.  Shower daily.  Reasons to call the Doctor:   Fever - Oral temperature greater than 100.4 degrees Fahrenheit  Foul-smelling vaginal discharge  Difficulty urinating  Nausea and vomiting  Increased pain at the site of the incision that is unrelieved with pain medicine.  Difficulty breathing with or without chest pain  New calf pain especially if only on one side  Sudden, continuing increased vaginal bleeding with or without clots.   Follow-up: 1. See Everitt Amber in 3-4 weeks.  Contacts: For questions or concerns you should contact:  Dr. Everitt Amber at [phone] After hours and on week-ends call [phone] and ask to speak to the physician on call for Gynecologic Oncology   Tramadol tablets What is this medicine? TRAMADOL (TRA ma dole) is a pain reliever. It is used to treat moderate to severe pain in  adults. This medicine may be used for other purposes; ask your health care provider or pharmacist if you have questions. COMMON BRAND NAME(S): Ultram What should I tell my health care provider before I take this medicine? They need to know if you have any of these conditions: -brain tumor -depression -drug abuse or addiction -head injury -if you frequently drink alcohol containing drinks -kidney disease or trouble passing urine -liver disease -lung disease, asthma, or breathing problems -seizures or epilepsy -suicidal thoughts, plans, or attempt; a previous suicide attempt by you or a family member -an unusual or allergic reaction to tramadol, codeine, other medicines, foods, dyes, or preservatives -pregnant or trying to get pregnant -breast-feeding How should I use this medicine? Take this medicine by mouth with a full glass of water. Follow the directions on the prescription label. You can take it with or without food. If it upsets your stomach, take it with food. Do not take your medicine more often than directed. A special MedGuide will be given to you by the pharmacist with each prescription and refill. Be sure to read this information carefully each time. Talk to your pediatrician regarding the use of this medicine in children. Special care may be needed. Overdosage: If you think you have taken too much of this medicine contact a poison control center or emergency room at once. NOTE: This medicine is only for  you. Do not share this medicine with others. What if I miss a dose? If you miss a dose, take it as soon as you can. If it is almost time for your next dose, take only that dose. Do not take double or extra doses. What may interact with this medicine? Do not take this medication with any of the following medicines: -MAOIs like Carbex, Eldepryl, Marplan, Nardil, and Parnate This medicine may also interact with the following medications: -alcohol -antihistamines for allergy,  cough and cold -certain medicines for anxiety or sleep -certain medicines for depression like amitriptyline, fluoxetine, sertraline -certain medicines for migraine headache like almotriptan, eletriptan, frovatriptan, naratriptan, rizatriptan, sumatriptan, zolmitriptan -certain medicines for seizures like carbamazepine, oxcarbazepine, phenobarbital, primidone -certain medicines that treat or prevent blood clots like warfarin -digoxin -furazolidone -general anesthetics like halothane, isoflurane, methoxyflurane, propofol -linezolid -local anesthetics like lidocaine, pramoxine, tetracaine -medicines that relax muscles for surgery -other narcotic medicines for pain or cough -phenothiazines like chlorpromazine, mesoridazine, prochlorperazine, thioridazine -procarbazine This list may not describe all possible interactions. Give your health care provider a list of all the medicines, herbs, non-prescription drugs, or dietary supplements you use. Also tell them if you smoke, drink alcohol, or use illegal drugs. Some items may interact with your medicine. What should I watch for while using this medicine? Tell your doctor or health care professional if your pain does not go away, if it gets worse, or if you have new or a different type of pain. You may develop tolerance to the medicine. Tolerance means that you will need a higher dose of the medicine for pain relief. Tolerance is normal and is expected if you take this medicine for a long time. Do not suddenly stop taking your medicine because you may develop a severe reaction. Your body becomes used to the medicine. This does NOT mean you are addicted. Addiction is a behavior related to getting and using a drug for a non-medical reason. If you have pain, you have a medical reason to take pain medicine. Your doctor will tell you how much medicine to take. If your doctor wants you to stop the medicine, the dose will be slowly lowered over time to avoid any  side effects. There are different types of narcotic medicines (opiates). If you take more than one type at the same time or if you are taking another medicine that also causes drowsiness, you may have more side effects. Give your health care provider a list of all medicines you use. Your doctor will tell you how much medicine to take. Do not take more medicine than directed. Call emergency for help if you have problems breathing or unusual sleepiness. You may get drowsy or dizzy. Do not drive, use machinery, or do anything that needs mental alertness until you know how this medicine affects you. Do not stand or sit up quickly, especially if you are an older patient. This reduces the risk of dizzy or fainting spells. Alcohol can increase or decrease the effects of this medicine. Avoid alcoholic drinks. You may have constipation. Try to have a bowel movement at least every 2 to 3 days. If you do not have a bowel movement for 3 days, call your doctor or health care professional. Your mouth may get dry. Chewing sugarless gum or sucking hard candy, and drinking plenty of water may help. Contact your doctor if the problem does not go away or is severe. What side effects may I notice from receiving this medicine? Side effects that you  should report to your doctor or health care professional as soon as possible: -allergic reactions like skin rash, itching or hives, swelling of the face, lips, or tongue -breathing problems -confusion -seizures -signs and symptoms of low blood pressure like dizziness; feeling faint or lightheaded, falls; unusually weak or tired -trouble passing urine or change in the amount of urine Side effects that usually do not require medical attention (report to your doctor or health care professional if they continue or are bothersome): -constipation -dry mouth -nausea, vomiting -tiredness This list may not describe all possible side effects. Call your doctor for medical advice about  side effects. You may report side effects to FDA at 1-800-FDA-1088. Where should I keep my medicine? Keep out of the reach of children. This medicine may cause accidental overdose and death if it taken by other adults, children, or pets. Mix any unused medicine with a substance like cat litter or coffee grounds. Then throw the medicine away in a sealed container like a sealed bag or a coffee can with a lid. Do not use the medicine after the expiration date. Store at room temperature between 15 and 30 degrees C (59 and 86 degrees F). NOTE: This sheet is a summary. It may not cover all possible information. If you have questions about this medicine, talk to your doctor, pharmacist, or health care provider.  2018 Elsevier/Gold Standard (2015-01-16 09:00:04)

## 2017-02-22 ENCOUNTER — Telehealth: Payer: Self-pay | Admitting: Gynecologic Oncology

## 2017-02-22 NOTE — Telephone Encounter (Signed)
Spoke with patient about final path.  She states she is doing well post-op.  She is going to take something to have a BM tonight.  States her incision at the umbilicus is slightly open but no bleeding or drainage.  She states her underwear band hits at that area.  Advised to use a barrier between the band and her skin.  She is to call if bleeding develops or if the incision opens more.  Reportable signs and symptoms reviewed.  She is to call for any needs.  She reported some episodes of hypoglycemia and she has been adjusting the dose of insulin she is giving herself.  Advised to reach out to Dr. Quillian Quince to make him aware in case adjustments need to be made.

## 2017-02-26 ENCOUNTER — Telehealth: Payer: Self-pay | Admitting: Gynecologic Oncology

## 2017-02-26 NOTE — Telephone Encounter (Signed)
Spoke with patient about final path and Dr. Serita Grit recommendations for vaginal brachytherapy. All questions answered.  Follow up appt previously arranged.  Advised to call for any needs.

## 2017-02-27 ENCOUNTER — Encounter: Payer: Self-pay | Admitting: Radiation Oncology

## 2017-02-27 ENCOUNTER — Other Ambulatory Visit: Payer: Self-pay | Admitting: Gynecologic Oncology

## 2017-02-27 DIAGNOSIS — C541 Malignant neoplasm of endometrium: Secondary | ICD-10-CM

## 2017-03-01 ENCOUNTER — Telehealth: Payer: Self-pay

## 2017-03-01 NOTE — Telephone Encounter (Signed)
Ms Rochette needed to r/s f/u appointment with Dr. Denman George on [id number] d/t conflicting appointments.   Gave her radiation oncology's scheduling phone number to r/s Dr. Clabe Seal appointment. [phone].

## 2017-03-04 LAB — CUP PACEART REMOTE DEVICE CHECK
Brady Statistic RA Percent Paced: 43 %
Brady Statistic RV Percent Paced: 1.1 %
Implantable Lead Implant Date: 20140811
Lead Channel Impedance Value: 430 Ohm
Lead Channel Impedance Value: 440 Ohm
Lead Channel Pacing Threshold Amplitude: 0.625 V
Lead Channel Sensing Intrinsic Amplitude: 12 mV
Lead Channel Setting Pacing Amplitude: 1.625
Lead Channel Setting Pacing Pulse Width: 0.4 ms
Lead Channel Setting Sensing Sensitivity: 2 mV
MDC IDC LEAD IMPLANT DT: 20140811
MDC IDC LEAD LOCATION: 753859
MDC IDC LEAD LOCATION: 753860
MDC IDC MSMT BATTERY REMAINING LONGEVITY: 136 mo
MDC IDC MSMT BATTERY REMAINING PERCENTAGE: 95.5 %
MDC IDC MSMT BATTERY VOLTAGE: 3.01 V
MDC IDC MSMT LEADCHNL RA PACING THRESHOLD AMPLITUDE: 0.625 V
MDC IDC MSMT LEADCHNL RA PACING THRESHOLD PULSEWIDTH: 0.4 ms
MDC IDC MSMT LEADCHNL RA SENSING INTR AMPL: 3.3 mV
MDC IDC MSMT LEADCHNL RV PACING THRESHOLD PULSEWIDTH: 0.4 ms
MDC IDC PG IMPLANT DT: 20140811
MDC IDC PG SERIAL: 7528878
MDC IDC SESS DTM: 20181008060029
MDC IDC SET LEADCHNL RV PACING AMPLITUDE: 0.875
MDC IDC STAT BRADY AP VP PERCENT: 1 %
MDC IDC STAT BRADY AP VS PERCENT: 43 %
MDC IDC STAT BRADY AS VP PERCENT: 1 %
MDC IDC STAT BRADY AS VS PERCENT: 55 %

## 2017-03-15 NOTE — Progress Notes (Signed)
GYN Location of Tumor / Histology: Endometrial cancer   Alicia [name] presented with symptoms of: episode of bright red bleeding which then turned to brown in July 2018.  Biopsies revealed:   02/19/17 Diagnosis 1. Lymph node, biopsy, right external iliac TWO BENIGN LYMPH NODES (0/2) 2. 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 Lymph nodes, regional resection, left pelvic SEVEN BENIGN LYMPH NODES (0/7)  12/31/16 Diagnosis 1. Endocervical polyp - BENIGN ENDOCERVICAL POLYP 2. Endometrium, biopsy - ENDOMETRIOID CARCINOMA, FIGO GRADE 2  Past/Anticipated interventions by Gyn/Onc surgery, if any: 02/19/17 - Procedure: XI ROBOTIC ASSISTED TOTAL HYSTERECTOMY WITH BILATERAL SALPINGO OOPHORECTOMY;  Surgeon: Everitt Amber, MD  Past/Anticipated interventions by medical oncology, if any: no  Weight changes, if any: has lost about 2 lbs since surgery.  States her appetite is OK.  Bowel/Bladder complaints, if any: denies having any bowel issues.  She reports having some bladder heaviness that started last Thursday.  She had a urinalysis/culture on Monday.  Nausea/Vomiting, if any: no  Pain issues, if any:  no  SAFETY ISSUES:  Prior radiation? no  Pacemaker/ICD? yes  Possible current pregnancy? no  Is the patient on methotrexate? no  Current Complaints / other details:  She reports having some vaginal spotting since surgery.  She is here with her husband.  BP (!) 144/33 (BP Location: Left Arm, Patient Position: Sitting, Cuff Size: Large)   Pulse 69   Temp 98.4 F (36.9 C) (Oral)   Ht 5\' 6"  (1.676 m)   Wt 239 lb 3.2 oz (108.5 kg)   SpO2 100%   BMI 38.61 kg/m    Wt Readings from Last 3 Encounters:  03/20/17 239 lb 3.2 oz  (108.5 kg)  03/18/17 238 lb 6.4 oz (108.1 kg)  02/19/17 240 lb (108.9 kg)

## 2017-03-18 ENCOUNTER — Other Ambulatory Visit: Payer: Medicare Other

## 2017-03-18 ENCOUNTER — Ambulatory Visit: Payer: Medicare Other | Admitting: Gynecologic Oncology

## 2017-03-18 ENCOUNTER — Encounter: Payer: Self-pay | Admitting: Gynecologic Oncology

## 2017-03-18 ENCOUNTER — Ambulatory Visit: Payer: Medicare Other

## 2017-03-18 ENCOUNTER — Ambulatory Visit: Payer: Medicare Other | Attending: Gynecologic Oncology | Admitting: Gynecologic Oncology

## 2017-03-18 ENCOUNTER — Ambulatory Visit: Payer: Medicare Other | Admitting: Radiation Oncology

## 2017-03-18 VITALS — BP 130/50 | HR 68 | Temp 97.8°F | Resp 20 | Wt 238.4 lb

## 2017-03-18 DIAGNOSIS — Z79899 Other long term (current) drug therapy: Secondary | ICD-10-CM | POA: Diagnosis not present

## 2017-03-18 DIAGNOSIS — E039 Hypothyroidism, unspecified: Secondary | ICD-10-CM | POA: Diagnosis not present

## 2017-03-18 DIAGNOSIS — Z885 Allergy status to narcotic agent status: Secondary | ICD-10-CM | POA: Insufficient documentation

## 2017-03-18 DIAGNOSIS — Z79891 Long term (current) use of opiate analgesic: Secondary | ICD-10-CM | POA: Insufficient documentation

## 2017-03-18 DIAGNOSIS — R3989 Other symptoms and signs involving the genitourinary system: Secondary | ICD-10-CM

## 2017-03-18 DIAGNOSIS — Z90710 Acquired absence of both cervix and uterus: Secondary | ICD-10-CM | POA: Diagnosis not present

## 2017-03-18 DIAGNOSIS — Z87442 Personal history of urinary calculi: Secondary | ICD-10-CM | POA: Diagnosis not present

## 2017-03-18 DIAGNOSIS — Z85820 Personal history of malignant melanoma of skin: Secondary | ICD-10-CM | POA: Insufficient documentation

## 2017-03-18 DIAGNOSIS — M1990 Unspecified osteoarthritis, unspecified site: Secondary | ICD-10-CM | POA: Diagnosis not present

## 2017-03-18 DIAGNOSIS — E669 Obesity, unspecified: Secondary | ICD-10-CM | POA: Insufficient documentation

## 2017-03-18 DIAGNOSIS — C541 Malignant neoplasm of endometrium: Secondary | ICD-10-CM | POA: Diagnosis not present

## 2017-03-18 DIAGNOSIS — Z8542 Personal history of malignant neoplasm of other parts of uterus: Secondary | ICD-10-CM | POA: Diagnosis not present

## 2017-03-18 DIAGNOSIS — Z888 Allergy status to other drugs, medicaments and biological substances status: Secondary | ICD-10-CM | POA: Insufficient documentation

## 2017-03-18 DIAGNOSIS — Z9889 Other specified postprocedural states: Secondary | ICD-10-CM | POA: Insufficient documentation

## 2017-03-18 DIAGNOSIS — Z91048 Other nonmedicinal substance allergy status: Secondary | ICD-10-CM | POA: Diagnosis not present

## 2017-03-18 DIAGNOSIS — Z800 Family history of malignant neoplasm of digestive organs: Secondary | ICD-10-CM | POA: Insufficient documentation

## 2017-03-18 DIAGNOSIS — G4730 Sleep apnea, unspecified: Secondary | ICD-10-CM | POA: Diagnosis not present

## 2017-03-18 DIAGNOSIS — I10 Essential (primary) hypertension: Secondary | ICD-10-CM | POA: Diagnosis not present

## 2017-03-18 DIAGNOSIS — Z950 Presence of cardiac pacemaker: Secondary | ICD-10-CM | POA: Insufficient documentation

## 2017-03-18 DIAGNOSIS — Z794 Long term (current) use of insulin: Secondary | ICD-10-CM | POA: Diagnosis not present

## 2017-03-18 DIAGNOSIS — Z882 Allergy status to sulfonamides status: Secondary | ICD-10-CM | POA: Insufficient documentation

## 2017-03-18 DIAGNOSIS — Z8241 Family history of sudden cardiac death: Secondary | ICD-10-CM | POA: Insufficient documentation

## 2017-03-18 DIAGNOSIS — F419 Anxiety disorder, unspecified: Secondary | ICD-10-CM | POA: Insufficient documentation

## 2017-03-18 DIAGNOSIS — E11649 Type 2 diabetes mellitus with hypoglycemia without coma: Secondary | ICD-10-CM | POA: Insufficient documentation

## 2017-03-18 DIAGNOSIS — Z9049 Acquired absence of other specified parts of digestive tract: Secondary | ICD-10-CM | POA: Insufficient documentation

## 2017-03-18 DIAGNOSIS — Z823 Family history of stroke: Secondary | ICD-10-CM | POA: Diagnosis not present

## 2017-03-18 DIAGNOSIS — I4891 Unspecified atrial fibrillation: Secondary | ICD-10-CM | POA: Insufficient documentation

## 2017-03-18 DIAGNOSIS — Z7189 Other specified counseling: Secondary | ICD-10-CM | POA: Diagnosis not present

## 2017-03-18 LAB — URINALYSIS, MICROSCOPIC - CHCC
BILIRUBIN (URINE): NEGATIVE
Glucose: NEGATIVE mg/dL
KETONES: NEGATIVE mg/dL
NITRITE: NEGATIVE
PROTEIN: 30 mg/dL
Specific Gravity, Urine: 1.015 (ref 1.003–1.035)
Urobilinogen, UR: 0.2 mg/dL (ref 0.2–1)
pH: 6 (ref 4.6–8.0)

## 2017-03-18 NOTE — Progress Notes (Signed)
Consult Note: Gyn-Onc  Alicia [name] 71 y.o. female  CC:  Chief Complaint  Alicia [name] presents with  . 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 Recommendation is for vaginal brachytherapy to reduce risk for local recurrence in accordance with NCCN guidelines.  I discussed this with the Alicia [name]. I discussed the role of adjuvant therapy. I discussed prognosis and risk for recurrence. We reviewed symptoms concerning for recurrence and Alicia [name] will see me if these develop prior to Alicia [name] scheduled appointment.  After completing adjuvant therapy I recommend Alicia [name] follow-up at 3 monthly intervals for symptom review, physical examination and pelvic examination. Pap smear is not recommended in routine endometrial cancer surveillance. After 2 years we will space these visits to every 6 months, and then annually if recurrence has not developed within 5 years. All questions were answered.   Alicia [name] has some bladder pressure postop. I am recommending UA and culture today. May be secondary to postop changes.  HPI: Alicia [name] is seen today in consultation at the request of Dr. [name].  Alicia [name] is a very pleasant 71 year old gravida 2 para 2 has been menopausal since Alicia [name] mid-40s. Alicia [name]'s never took any hormone replacement therapy. Alicia [name] had an episode of bright red bleeding which then turned to brown in July 2018. Alicia [name] states Alicia [name] was seen by Alicia [name] primary care provider who performed a Pap smear. Alicia [name] does not know what those results are. Alicia [name] was then referred to Dr. [name]. Alicia [name] underwent a pelvic ultrasound on 12/17/2016. It revealed the uterus to be 9.8 x 4.9 x 6.1 cm with an anterior subserosal fibroid measuring 4 x 3 x 2.8 cm. The endometrium was 1.59 cm. The adnexa were unremarkable. Alicia [name] subsequently underwent an endometrial biopsy on August 27. Alicia [name] had a benign endocervical polyp. Alicia [name] endometrium revealed a grade 2  endometrioid adenocarcinoma. Is for this reason that Alicia [name] is referred to [place] today.  Alicia [name] is otherwise in Alicia [name] usual state of health. Alicia [name] states Alicia [name] is always cold and relates that secondary to Alicia [name] thyroid disease. Alicia [name] states last night Alicia [name] will cup sweating and what Alicia [name] describes is disoriented. Alicia [name] checked Alicia [name] sugar and Alicia [name] blood pressure. Alicia [name] states Alicia [name] blood pressure was okay and checked Alicia [name] sugar which was 56. Alicia [name] then took glucose pills that Alicia [name] had at home and a repair. Alicia [name] Accu-Cheks went from 25 then to 11 and Alicia [name] started feeling better. This morning Alicia [name] blood sugar was 96. Alicia [name] last A1c was 6.3. Alicia [name] states that usually Alicia [name] sugars run from 110 220. This is a first time that Alicia [name] has had to take sugar pills at home. Alicia [name] states Alicia [name]'s had one other episode of what Alicia [name] describes as hypoglycemia at a restaurant and Alicia [name] felt better after drinking orange juice. After Alicia [name] wasn't sure that was related to Alicia [name] atrial fibrillation Alicia [name] felt a little bit nauseated and occasionally with Alicia [name] atrial fibrillation Alicia [name] will. Slightly nauseated.  Alicia [name] denies any chest pain or shortness of breath. Alicia [name] had a pacemaker placed about 6 months after that began experiencing a defibrillation. Alicia [name]'s not sure when Alicia [name] last A. fib episode was. Alicia [name]'s never had a cardiac catheterization. Alicia [name] cardiologist is Dr. [name] and Alicia [name] saw him in the summer. Alicia [name] does endorse some lower extremity edema. Alicia [name] is on hydrochlorothiazide but Alicia [name] last took it on Saturday Alicia [name] needs to get Alicia [name] prescription refilled. Alicia [name] last mammogram was 3 years ago. Alicia [name] has not had once Alicia [name] had  a pacemaker placed for fear that it would hurt. Alicia [name] last colonoscopy was in 2003.  Alicia [name] father's health is notable for head and neck cancer he was a heavy smoker and drink. He certainly died at the age of 104 from a tongue cancer. Alicia [name] mother died of a stroke. All of Alicia [name] mother's siblings except one died of cancer. This includes men and women. Alicia [name] does not know the types.  Interval  Hx:  Preop creatinine was very elevated at 2.5, however re-check was normal at 1.3.  On 10/16 Alicia [name] underwent robotic hysterectomy, BSO, SLN biopsy and left pelvic lymphadenectomy. Final pathology revealed a grade 2 tumor, 3.6cm with 0.2 of 2.2cm myo invasion, no LVSI and no cervical or nodal involvement. Alicia [name] was determined to have high/intermediate risk factors and therefore adjuvant radiation as prescribed.   Review of Systems: Constitutional: Feels cold all the time, this is no change Skin: No rash Cardiovascular: No chest pain, shortness of breath, + LE edema as Alicia [name] has not taken Alicia [name] Lasix and Saturday Pulmonary: No cough or wheeze.  Gastro Intestinal:  No nausea, vomiting, constipation, or diarrhea reported. No bright red blood per rectum or change in bowel movement.  Genitourinary: No frequency, urgency, or dysuria.  + vaginal bleeding and discharge.  Musculoskeletal: + Bilateral knee pain. Alicia [name] states that Alicia [name] needs injections per mail in Alicia [name] right knee. Alicia [name] had arthroscopic knee surgery that "did not work" as Alicia [name] took a blood thinner and had a "hemorrhage" into Alicia [name] knee.  Neurologic: No weakness Psychology: No changes  Current Meds:  Outpatient Encounter Medications as of 03/18/2017  Medication Sig  . apixaban (ELIQUIS) 5 MG TABS tablet Take 1 tablet (5 mg total) by mouth 2 (two) times daily. PT NEEDS APPT, CALL [phone] TO SCHEDULE, 1ST ATTEMPT  . citalopram (CELEXA) 20 MG tablet Take 10 mg daily by mouth.   . diltiazem (CARDIZEM) 30 MG tablet Take 30 mg by mouth See admin instructions. Pt to take as needed if going into AFIB  . hydrochlorothiazide (HYDRODIURIL) 25 MG tablet Take 25 mg by mouth daily.   . insulin NPH-regular Human (NOVOLIN 70/30) (70-30) 100 UNIT/ML injection Inject 48 Units into the skin 2 (two) times daily with a meal. Pt takes 48 units every morning, and 48 units every evening  . INSULIN SYRINGE 1CC/29G 29G X 1/2" 1 ML MISC   . levothyroxine (SYNTHROID) 112  MCG tablet Take 1 tablet (112 mcg total) by mouth daily before breakfast.  . losartan (COZAAR) 100 MG tablet Take 50 mg daily by mouth.   . Misc Natural Products (ESSIAC TONIC) CAPS Take 2 capsules by mouth 2 (two) times daily.  . Multiple Vitamins-Minerals (ICAPS) CAPS Take 1 capsule by mouth daily.   [name] (SYSTANE) 0.4-0.3 % SOLN Place 1 drop into both eyes daily as needed (dry eyes).   . Pramoxine-Menthol (ANTI-ITCH MEDICATION EX) Apply 1 application topically 2 (two) times daily as needed (Itching, hives). DerMend Steroid Free  . Probiotic Product (PROBIOTIC DAILY PO) Take 1 tablet by mouth daily.  . traMADol (ULTRAM) 50 MG tablet Take 1 tablet (50 mg total) by mouth every 6 (six) hours as needed.  . flecainide (TAMBOCOR) 100 MG tablet Take 3 tablets (300 mg total) by mouth once. AS NEEDED (Alicia [name] taking differently: Take 300 mg by mouth See admin instructions. AS NEEDED if going into AFIB)   No facility-administered encounter medications on file as of 03/18/2017.     Allergy:  Allergies  Allergen Reactions  .  Ace Inhibitors Cough  . Cardizem [Diltiazem Hcl] Other (See Comments)    headache  . Oxycodone Nausea Only  . Statins Other (See Comments)    myalgias  . Sulfa Antibiotics Hives and Swelling  . Tape     Paper tape is okay  . Cortisone Rash    flushing  . Latex Rash    Sensitive not allergic  . Tartrazine Rash    Social Hx:   Social History   Socioeconomic History  . Marital status: Married    Spouse name: Not on file  . Number of children: Not on file  . Years of education: Not on file  . Highest education level: Not on file  Social Needs  . Financial resource strain: Not on file  . Food insecurity - worry: Not on file  . Food insecurity - inability: Not on file  . Transportation needs - medical: Not on file  . Transportation needs - non-medical: Not on file  Occupational History  . Not on file  Tobacco Use  . Smoking status:  Never Smoker  . Smokeless tobacco: Never Used  Substance and Sexual Activity  . Alcohol use: No    Alcohol/week: 0.0 oz  . Drug use: No  . Sexual activity: Not Currently    Birth control/protection: Post-menopausal  Other Topics Concern  . Not on file  Social History Narrative   Retired and lives in [place] with spouse    Past Surgical Hx:  Past Surgical History:  Procedure Laterality Date  . ANKLE SURGERY Right    multiple  . BREAST SURGERY     biopsy L breast benign  . CHOLECYSTECTOMY    . COLONOSCOPY    . EYE SURGERY     both cataracts  . FOOT ARTHROTOMY     left  . FOOT SURGERY     Bil  bone spurs /Left  foot neuroma removed Right foot sinus tarside surgery Right bone in toe removed  . KNEE SURGERY     right  . 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 SHOULDER SURGERY Right 2012    Past Medical Hx:  Past Medical History:  Diagnosis Date  . Anemia    hx of  . Anxiety   . Cancer [facility])    endometrial cancer/ skin cancers removed nose non melanoma  . Diabetes ([place])    Type 2  . DJD (degenerative joint disease)   . Dysrhythmia    SYMPTOMATIC BRADYCARDIA  / Afib  . Gallstones   . History of kidney stones    Hx of   . Hypertension    HCTZ  . Hypothyroidism   . Obesity   . 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 Sleep apnea    No machine Cant tolerate  . Wears glasses     Oncology Hx:    Endometrial ca [facility])   12/31/2016 Initial Diagnosis    Endometrial ca [facility])       Family Hx:  Family History  Problem Relation Age of Onset  . Sudden death Brother        age 74s, died while playing raquetball  . Stroke Mother   . Cancer Father        throat  . Allergic rhinitis Neg Hx   . Angioedema Neg Hx   . Asthma Neg Hx   . Atopy Neg  Hx   . Eczema Neg Hx   . Immunodeficiency Neg Hx   . Urticaria Neg Hx     Vitals:  Blood pressure (!) 130/50, pulse 68,  temperature 97.8 F (36.6 C), temperature source Oral, resp. rate 20, weight 238 lb 6.4 oz (108.1 kg), SpO2 100 %.  Physical Exam: Well-nourished older white female in no acute distress.  Neck: Supple, no lymphadenopathy, no thyromegaly.  Lungs: Clear to auscultation bilaterally.  Cardiac: Regular rate and rhythm. No murmur.  Abdomen: Obese well-healed laparoscopic incisions. Abdomen is soft, nontender, nondistended. There are no palpable masses or hepatomegaly. Exam is somewhat limited by habitus. Incisions well healed (umbilicus slightly open but granulating).  Groins: No lymphadenopathy.  Extremities: 2+ pitting edema equal bilaterally in the lower extremities. No calf tenderness.  Pelvic: External genitalia is within normal limits. Vagina is well healed at the cuff. No lesions. No masses   20 minutes of direct face to face counseling time was spent with the Alicia [name]. This included discussion about prognosis, therapy recommendations and postoperative side effects and are beyond the scope of routine postoperative care.  Donaciano Eva, MD 03/18/2017, 4:06 PM

## 2017-03-18 NOTE — Patient Instructions (Signed)
Please follow-up as scheduled with Dr Sondra Come.  Dr Denman George will see you back 3 months after you have completed radiation therapy (please call [phone]) for this appointment.

## 2017-03-19 ENCOUNTER — Telehealth: Payer: Self-pay

## 2017-03-19 DIAGNOSIS — E8881 Metabolic syndrome: Secondary | ICD-10-CM | POA: Diagnosis not present

## 2017-03-19 DIAGNOSIS — N189 Chronic kidney disease, unspecified: Secondary | ICD-10-CM | POA: Diagnosis not present

## 2017-03-19 DIAGNOSIS — M79671 Pain in right foot: Secondary | ICD-10-CM | POA: Diagnosis not present

## 2017-03-19 DIAGNOSIS — E1165 Type 2 diabetes mellitus with hyperglycemia: Secondary | ICD-10-CM | POA: Diagnosis not present

## 2017-03-19 DIAGNOSIS — K210 Gastro-esophageal reflux disease with esophagitis: Secondary | ICD-10-CM | POA: Diagnosis not present

## 2017-03-19 DIAGNOSIS — L600 Ingrowing nail: Secondary | ICD-10-CM | POA: Diagnosis not present

## 2017-03-19 DIAGNOSIS — E039 Hypothyroidism, unspecified: Secondary | ICD-10-CM | POA: Diagnosis not present

## 2017-03-19 DIAGNOSIS — E1151 Type 2 diabetes mellitus with diabetic peripheral angiopathy without gangrene: Secondary | ICD-10-CM | POA: Diagnosis not present

## 2017-03-19 DIAGNOSIS — E1140 Type 2 diabetes mellitus with diabetic neuropathy, unspecified: Secondary | ICD-10-CM | POA: Diagnosis not present

## 2017-03-19 DIAGNOSIS — E782 Mixed hyperlipidemia: Secondary | ICD-10-CM | POA: Diagnosis not present

## 2017-03-19 DIAGNOSIS — I10 Essential (primary) hypertension: Secondary | ICD-10-CM | POA: Diagnosis not present

## 2017-03-19 DIAGNOSIS — Z9189 Other specified personal risk factors, not elsewhere classified: Secondary | ICD-10-CM | POA: Diagnosis not present

## 2017-03-19 NOTE — Telephone Encounter (Signed)
Spoke with Ms Horn and told her that the u/a looks suspicious  for a UTI per Joylene John, NP. Pt states that she has some lower abdominal pressure.  She would rather wait for urine culture to result prior to beginning ATB.  Pt afebrile.  She is pushing fluids. Will notify her of urine culture results when available.

## 2017-03-20 ENCOUNTER — Other Ambulatory Visit: Payer: Self-pay

## 2017-03-20 ENCOUNTER — Encounter: Payer: Self-pay | Admitting: Radiation Oncology

## 2017-03-20 ENCOUNTER — Ambulatory Visit
Admission: RE | Admit: 2017-03-20 | Discharge: 2017-03-20 | Disposition: A | Discharge status: Home / Self Care | Payer: Medicare Other | Source: Ambulatory Visit | Attending: Radiation Oncology | Admitting: Radiation Oncology

## 2017-03-20 DIAGNOSIS — G4730 Sleep apnea, unspecified: Secondary | ICD-10-CM | POA: Diagnosis not present

## 2017-03-20 DIAGNOSIS — Z950 Presence of cardiac pacemaker: Secondary | ICD-10-CM | POA: Insufficient documentation

## 2017-03-20 DIAGNOSIS — Z882 Allergy status to sulfonamides status: Secondary | ICD-10-CM | POA: Diagnosis not present

## 2017-03-20 DIAGNOSIS — C541 Malignant neoplasm of endometrium: Secondary | ICD-10-CM

## 2017-03-20 DIAGNOSIS — Z87442 Personal history of urinary calculi: Secondary | ICD-10-CM | POA: Diagnosis not present

## 2017-03-20 DIAGNOSIS — Z90722 Acquired absence of ovaries, bilateral: Secondary | ICD-10-CM | POA: Insufficient documentation

## 2017-03-20 DIAGNOSIS — Z823 Family history of stroke: Secondary | ICD-10-CM | POA: Insufficient documentation

## 2017-03-20 DIAGNOSIS — F419 Anxiety disorder, unspecified: Secondary | ICD-10-CM | POA: Insufficient documentation

## 2017-03-20 DIAGNOSIS — R3915 Urgency of urination: Secondary | ICD-10-CM | POA: Insufficient documentation

## 2017-03-20 DIAGNOSIS — Z885 Allergy status to narcotic agent status: Secondary | ICD-10-CM | POA: Diagnosis not present

## 2017-03-20 DIAGNOSIS — E039 Hypothyroidism, unspecified: Secondary | ICD-10-CM | POA: Diagnosis not present

## 2017-03-20 DIAGNOSIS — Z794 Long term (current) use of insulin: Secondary | ICD-10-CM | POA: Diagnosis not present

## 2017-03-20 DIAGNOSIS — Z79899 Other long term (current) drug therapy: Secondary | ICD-10-CM | POA: Insufficient documentation

## 2017-03-20 DIAGNOSIS — Z9049 Acquired absence of other specified parts of digestive tract: Secondary | ICD-10-CM | POA: Diagnosis not present

## 2017-03-20 DIAGNOSIS — Z808 Family history of malignant neoplasm of other organs or systems: Secondary | ICD-10-CM | POA: Insufficient documentation

## 2017-03-20 DIAGNOSIS — Z90710 Acquired absence of both cervix and uterus: Secondary | ICD-10-CM | POA: Diagnosis not present

## 2017-03-20 DIAGNOSIS — Z91040 Latex allergy status: Secondary | ICD-10-CM | POA: Diagnosis not present

## 2017-03-20 DIAGNOSIS — E669 Obesity, unspecified: Secondary | ICD-10-CM | POA: Insufficient documentation

## 2017-03-20 DIAGNOSIS — Z7901 Long term (current) use of anticoagulants: Secondary | ICD-10-CM | POA: Diagnosis not present

## 2017-03-20 DIAGNOSIS — E119 Type 2 diabetes mellitus without complications: Secondary | ICD-10-CM | POA: Insufficient documentation

## 2017-03-20 DIAGNOSIS — Z6838 Body mass index (BMI) 38.0-38.9, adult: Secondary | ICD-10-CM | POA: Insufficient documentation

## 2017-03-20 DIAGNOSIS — I10 Essential (primary) hypertension: Secondary | ICD-10-CM | POA: Insufficient documentation

## 2017-03-20 DIAGNOSIS — Z888 Allergy status to other drugs, medicaments and biological substances status: Secondary | ICD-10-CM | POA: Diagnosis not present

## 2017-03-20 NOTE — Progress Notes (Signed)
Radiation Oncology         [phone] ________________________________  Initial Outpatient Consultation  Name: Alicia [name] MRN: [id number]  Date: 03/20/2017  DOB: 06-18-1945  NW:GNFAOZ, Mitzie Na, MD  Everitt Amber, MD   REFERRING PHYSICIAN: Everitt Amber, MD  DIAGNOSIS: Stage I-A grade 2 endometrioid endometrial cancer  HISTORY OF PRESENT ILLNESS: Alicia [name] is a 71 y.o. female who was referred to [place] from the care of Dr Denman George to discuss the potential role of radiotherapy in the ongoing management of her endometrial cancer.  Initially, the patient noticed an episode of bright red bleeding which turned brown around July 2018. She was seen by her PCP who performed an endometrial biopsy, however, the results of this were never returned to her. She was then referred to Dr [name] where she underwent a pelvic US on 12/17/16 which revealed the uterus to be 9.8 x 4.9 x 6.1 cm with an anterior subserosal fibroid measuring 4 x 3 x 2.8 cm. The endometrium was 1.59 cm. The adnexa were unremarkable. Following this, on 12/31/16 she underwent endometrial biopsy. This showed grade 2 endometrioid adenocarcinoma.  On 02/19/17 she underwent robotic hysterectomy, BSO, SLN biopsy and left pelvic lymphadenectomy by Dr. Everitt Amber. Surgical pathology revealed this to be grade 2 endometrioid carcinoma. The tumor invaded less than half of the myometrium, surgical margins were negative. Seven left pelvic lymph nodes and two right external iliac nodes were surveyed, all of which were negative for the presence of carcinoma. Bilateral ovaries and the bilateral fallopian tubes were both without the presence of carcinoma as well.   Since her surgery, she reports that she has been recovering very well. She denies any issues with leg swelling or pain. She did not have to use her prescribed pain medications following her surgery. Additionally, she does note that over the past week that she has been experiencing urinary urgency,  however, on voiding she reports that she has only been passing very small amounts of urine. She had a UA performed with Dr Denman George yesterday which was initially negative, however, cultures from this were sent out which are still pending. Otherwise, she reports that she is feeling at her baseline and she is otherwise without acute complaint.   On review of systems, pt denies fever, chills, rash, mouth sores, weight loss, decreased appetites. Denies pain. Pt denies abdominal pain, nausea, vomiting. Notable for positives as in the above HPI.   PREVIOUS RADIATION THERAPY: No  PAST MEDICAL HISTORY:  has a past medical history of Anemia, Anxiety, Cancer ([place]), Diabetes ([place]), DJD (degenerative joint disease), Dysrhythmia, Gallstones, History of kidney stones, Hypertension, Hypothyroidism, Obesity, Pacemaker (12/15/2012), Sleep apnea, and Wears glasses.    PAST SURGICAL HISTORY: Past Surgical History:  Procedure Laterality Date  . ANKLE SURGERY Right    multiple  . BREAST SURGERY     biopsy L breast benign  . CHOLECYSTECTOMY    . COLONOSCOPY    . EYE SURGERY     both cataracts  . FOOT ARTHROTOMY     left  . FOOT SURGERY     Bil  bone spurs /Left  foot neuroma removed Right foot sinus tarside surgery Right bone in toe removed  . KNEE SURGERY     right  . 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 SHOULDER SURGERY Right 2012    FAMILY HISTORY: family history includes Cancer in her  father; Stroke in her mother; Sudden death in her brother; Tongue cancer in her father.  SOCIAL HISTORY:  reports that  has never smoked. she has never used smokeless tobacco. She reports that she does not drink alcohol or use drugs.  ALLERGIES: Ace inhibitors; Cardizem [diltiazem hcl]; Oxycodone; Statins; Sulfa antibiotics; Tape; Cortisone; Latex; and Tartrazine  MEDICATIONS:  Current Outpatient Medications  Medication Sig Dispense  Refill  . apixaban (ELIQUIS) 5 MG TABS tablet Take 1 tablet (5 mg total) by mouth 2 (two) times daily. PT NEEDS APPT, CALL [phone] TO SCHEDULE, 1ST ATTEMPT 60 tablet 0  . citalopram (CELEXA) 20 MG tablet Take 10 mg daily by mouth.     . diltiazem (CARDIZEM) 30 MG tablet Take 30 mg by mouth See admin instructions. Pt to take as needed if going into AFIB    . flecainide (TAMBOCOR) 100 MG tablet Take 3 tablets (300 mg total) by mouth once. AS NEEDED (Patient taking differently: Take 300 mg by mouth See admin instructions. AS NEEDED if going into AFIB) 30 tablet 3  . hydrochlorothiazide (HYDRODIURIL) 25 MG tablet Take 25 mg by mouth daily.     . insulin NPH-regular Human (NOVOLIN 70/30) (70-30) 100 UNIT/ML injection Inject 48 Units into the skin 2 (two) times daily with a meal. Pt takes 48 units every morning, and 48 units every evening    . INSULIN SYRINGE 1CC/29G 29G X 1/2" 1 ML MISC     . levothyroxine (SYNTHROID) 112 MCG tablet Take 1 tablet (112 mcg total) by mouth daily before breakfast. 30 tablet 3  . losartan (COZAAR) 100 MG tablet Take 50 mg daily by mouth.     . Misc Natural Products (ESSIAC TONIC) CAPS Take 2 capsules by mouth 2 (two) times daily.    . Multiple Vitamins-Minerals (ICAPS) CAPS Take 1 capsule by mouth daily.     [name] (SYSTANE) 0.4-0.3 % SOLN Place 1 drop into both eyes daily as needed (dry eyes).     . Pramoxine-Menthol (ANTI-ITCH MEDICATION EX) Apply 1 application topically 2 (two) times daily as needed (Itching, hives). DerMend Steroid Free    . Probiotic Product (PROBIOTIC DAILY PO) Take 1 tablet by mouth daily.     No current facility-administered medications for this encounter.     REVIEW OF SYSTEMS:  A 10+ POINT REVIEW OF SYSTEMS WAS OBTAINED including neurology, dermatology, psychiatry, cardiac, respiratory, lymph, extremities, GI, GU, Musculoskeletal, constitutional, breasts, reproductive, HEENT.  All pertinent positives are noted in  the HPI.  All others are negative.   PHYSICAL EXAM:  height is 5\' 6"  (1.676 m) and weight is 239 lb 3.2 oz (108.5 kg). Her oral temperature is 98.4 F (36.9 C). Her blood pressure is 144/33 (abnormal) and her pulse is 69. Her oxygen saturation is 100%.   General: Alert and oriented, in no acute distress, accompanied by her husband on evaluation today HEENT: Head is normocephalic. Extraocular movements are intact. Oropharynx is clear. Neck: Neck is supple, no palpable cervical or supraclavicular lymphadenopathy. Heart: Regular in rate and rhythm with no murmurs, rubs, or gallops. Chest: Clear to auscultation bilaterally, with no rhonchi, wheezes, or rales. Pacemaker in place Abdomen: Soft, nontender, nondistended, with no rigidity or guarding. Pt has small scars along the abdominal area form her laparoscopic procedure. Some mild erythema to the umbilical scar, but no signs of infections.  Extremities: No cyanosis or edema. Lymphatics: see Neck Exam Skin: No concerning lesions. Musculoskeletal: symmetric strength and muscle tone throughout. Neurologic: Cranial  nerves II through XII are grossly intact. No obvious focalities. Speech is fluent. Coordination is intact. Psychiatric: Judgment and insight are intact. Affect is appropriate. Pelvic exam was deferred today as she recently had this performed with Dr Denman George, we will perform this again on CT Sim and treatment planning day.   ECOG = 0  LABORATORY DATA:  Lab Results  Component Value Date   WBC 7.8 02/20/2017   HGB 11.5 (L) 02/20/2017   HCT 35.4 (L) 02/20/2017   MCV 94.1 02/20/2017   PLT 234 02/20/2017   NEUTROABS 6.6 (H) 04/01/2014   Lab Results  Component Value Date   NA 138 02/20/2017   K 4.1 02/20/2017   CL 100 (L) 02/20/2017   CO2 30 02/20/2017   GLUCOSE 161 (H) 02/20/2017   CREATININE 1.10 (H) 02/20/2017   CALCIUM 8.3 (L) 02/20/2017     RADIOGRAPHY: No results found.    IMPRESSION: Alicia [name] is a very pleasant 71  y.o. female with a new diagnosis of early stage endometrial cancer who presents today to discuss the role of radiotherapy in the ongoing management of her stage 1-A grade 2 endometrioid endometrial cancer. The patient has high/intermediate risk factors for recurrence and I would agree with Dr Serita Grit recommendations for vaginal brachytherapy to reduce the risk of local recurrence in the proximal vagina. The risks vs benefits, side effects, and potential toxicities were discussed at great length with the patient.  The patient appears to understand and wishes to proceed with planned course of treatment.   PLAN: The patient will be set up for planning and her first treatment the week of November 26th which is approximately 6 wks out from her surgery. I anticipate 5 HDR treatments directed at the vaginal cuff with iridium 192 as the high-dose-rate source.    ------------------------------------------------  Blair Promise, PhD, MD  This document serves as a record of services personally performed by Gery Pray, MD. It was created on his behalf by Reola Mosher, a trained medical scribe. The creation of this record is based on the scribe's personal observations and the provider's statements to them. This document has been checked and approved by the attending provider.

## 2017-03-21 ENCOUNTER — Telehealth: Payer: Self-pay | Admitting: Gynecologic Oncology

## 2017-03-21 DIAGNOSIS — N390 Urinary tract infection, site not specified: Secondary | ICD-10-CM

## 2017-03-21 DIAGNOSIS — C541 Malignant neoplasm of endometrium: Secondary | ICD-10-CM

## 2017-03-21 LAB — URINE CULTURE

## 2017-03-21 MED ORDER — NITROFURANTOIN MONOHYD MACRO 100 MG PO CAPS: 100.0000 mg | ORAL_CAPSULE | Freq: Two times a day (BID) | ORAL | 0 refills | Status: DC

## 2017-03-21 NOTE — Telephone Encounter (Signed)
Informed patient of urine culture results.  Also discussed MSI testing results with patient with the recommendation for a genetics referral.  Patient agreeable with this plan.  Advised patient that we would send in [place] for seven days.  Reportable signs and symptoms reviewed.  Allergy alert flagged with macrobid due to rash from tartrazine.  Patient states she does not know what that is and states she does not have any dietary restrictions against yellow dye.  She will call for any new symptoms.  Advised she would be contacted from the [facility] with an appt to meet with the genetics counselor.  Advised to call for any needs or concerns.

## 2017-03-22 ENCOUNTER — Telehealth: Payer: Self-pay | Admitting: Genetics

## 2017-03-22 ENCOUNTER — Telehealth: Payer: Self-pay | Admitting: *Deleted

## 2017-03-22 DIAGNOSIS — C541 Malignant neoplasm of endometrium: Secondary | ICD-10-CM | POA: Diagnosis not present

## 2017-03-22 DIAGNOSIS — E782 Mixed hyperlipidemia: Secondary | ICD-10-CM | POA: Diagnosis not present

## 2017-03-22 DIAGNOSIS — E1122 Type 2 diabetes mellitus with diabetic chronic kidney disease: Secondary | ICD-10-CM | POA: Diagnosis not present

## 2017-03-22 DIAGNOSIS — L500 Allergic urticaria: Secondary | ICD-10-CM | POA: Diagnosis not present

## 2017-03-22 DIAGNOSIS — N183 Chronic kidney disease, stage 3 (moderate): Secondary | ICD-10-CM | POA: Diagnosis not present

## 2017-03-22 DIAGNOSIS — I10 Essential (primary) hypertension: Secondary | ICD-10-CM | POA: Diagnosis not present

## 2017-03-22 DIAGNOSIS — E039 Hypothyroidism, unspecified: Secondary | ICD-10-CM | POA: Diagnosis not present

## 2017-03-22 DIAGNOSIS — Z6838 Body mass index (BMI) 38.0-38.9, adult: Secondary | ICD-10-CM | POA: Diagnosis not present

## 2017-03-22 NOTE — Telephone Encounter (Signed)
Genetic counseling appt has been scheduled for the pt to see Ferol Luz on 12/24 at [facility] from gyn onc will notify the pt.

## 2017-03-22 NOTE — Telephone Encounter (Signed)
Scheduled the patient for a genetics appt for December 24th at [facility] the left a message to the machine with the information for the patient.

## 2017-03-22 NOTE — Telephone Encounter (Signed)
Called patient to inform of HDR [place], lvm for a return call

## 2017-03-26 DIAGNOSIS — M25579 Pain in unspecified ankle and joints of unspecified foot: Secondary | ICD-10-CM | POA: Diagnosis not present

## 2017-03-26 DIAGNOSIS — M79671 Pain in right foot: Secondary | ICD-10-CM | POA: Diagnosis not present

## 2017-04-03 ENCOUNTER — Telehealth: Payer: Self-pay | Admitting: *Deleted

## 2017-04-03 ENCOUNTER — Telehealth: Payer: Self-pay | Admitting: Cardiology

## 2017-04-03 ENCOUNTER — Telehealth: Payer: Self-pay | Admitting: Oncology

## 2017-04-03 NOTE — Telephone Encounter (Signed)
Form re-faxed

## 2017-04-03 NOTE — Telephone Encounter (Signed)
Follow Up:     Alicia [name] says she needs the pacer maker form today please. She says pt will start radiation tomorrow.

## 2017-04-03 NOTE — Telephone Encounter (Signed)
Called patient to remind of HDR [facility] for 04-04-17, spoke with patient and she is aware of these appts.

## 2017-04-03 NOTE — Telephone Encounter (Signed)
Left a message for the device clinic regarding pacemaker form.  Requested a return call.

## 2017-04-04 ENCOUNTER — Ambulatory Visit
Admission: RE | Admit: 2017-04-04 | Discharge: 2017-04-04 | Disposition: A | Discharge status: Home / Self Care | Payer: Medicare Other | Source: Ambulatory Visit | Attending: Radiation Oncology | Admitting: Radiation Oncology

## 2017-04-04 ENCOUNTER — Other Ambulatory Visit: Payer: Self-pay

## 2017-04-04 DIAGNOSIS — C541 Malignant neoplasm of endometrium: Secondary | ICD-10-CM | POA: Diagnosis not present

## 2017-04-04 DIAGNOSIS — F419 Anxiety disorder, unspecified: Secondary | ICD-10-CM | POA: Diagnosis not present

## 2017-04-04 DIAGNOSIS — E119 Type 2 diabetes mellitus without complications: Secondary | ICD-10-CM | POA: Diagnosis not present

## 2017-04-04 DIAGNOSIS — Z90722 Acquired absence of ovaries, bilateral: Secondary | ICD-10-CM | POA: Diagnosis not present

## 2017-04-04 DIAGNOSIS — Z90710 Acquired absence of both cervix and uterus: Secondary | ICD-10-CM | POA: Diagnosis not present

## 2017-04-04 DIAGNOSIS — Z87442 Personal history of urinary calculi: Secondary | ICD-10-CM | POA: Diagnosis not present

## 2017-04-04 NOTE — Progress Notes (Signed)
Zitlali is here for follow up/HDR.  She denies having any pain.  She reports having yellow/brown discharge which she has had since surgery.  She denies having any bowel/bladder issues.  She reports having hives on her arms and legs that have increased since surgery.  BP 140/62 (BP Location: Left Wrist, Patient Position: Sitting)   Pulse 62   Temp 97.9 F (36.6 C) (Oral)   Ht 5\' 6"  (1.676 m)   Wt 239 lb 3.2 oz (108.5 kg)   SpO2 100%   BMI 38.61 kg/m    Wt Readings from Last 3 Encounters:  04/04/17 239 lb 3.2 oz (108.5 kg)  03/20/17 239 lb 3.2 oz (108.5 kg)  03/18/17 238 lb 6.4 oz (108.1 kg)

## 2017-04-04 NOTE — Progress Notes (Signed)
  Radiation Oncology         [phone] ________________________________  Name: Alicia [name] MRN: [id number]  Date: 04/04/2017  DOB: 1946-04-27  SIMULATION AND TREATMENT PLANNING NOTE HDR BRACHYTHERAPY  DIAGNOSIS:  Stage I-A grade2endometrioid endometrialcancer   NARRATIVE:  The patient was brought to the [facility] suite.  Identity was confirmed.  All relevant records and images related to the planned course of therapy were reviewed.  The patient freely provided informed written consent to proceed with treatment after reviewing the details related to the planned course of therapy. The consent form was witnessed and verified by the simulation staff.  Then, the patient was set-up in a stable reproducible  supine position for radiation therapy.  CT images were obtained.  Surface markings were placed.  The CT images were loaded into the planning software.  Then the target and avoidance structures were contoured.  Treatment planning then occurred.  The radiation prescription was entered and confirmed.   I have requested : Brachytherapy Isodose Plan and Dosimetry Calculations to plan the radiation distribution.    PLAN:  The patient will receive 30 Gy in 5 fractions.  Patient will be treated with iridium 192 as the high-dose-rate source. She will receive 5 treatments of 6 gray prescribed to the vaginal mucosal surface. The patient will be treated with a 3 cm diameter cylinder and a treatment length of 3.5 cm.    ________________________________  Blair Promise, PhD, MD

## 2017-04-04 NOTE — Progress Notes (Signed)
Radiation Oncology         [phone] ________________________________  Name: Alicia [name] MRN: [id number]  Date: 04/04/2017  DOB: Feb 08, 1946  Vaginal Brachytherapy Procedure Note  CC: Alicia Bis, MD Alicia Amber, MD    ICD-10-CM   1. 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 Pt denies having any pain.  She reports having yellow/brown discharge which she has had since surgery. The pt denies having any bowel/bladder issues.  She reports having hives on her arms and legs that have increased since surgery. Topical medication controls this issue.   ALLERGIES: is allergic to ace inhibitors; cardizem [diltiazem hcl]; oxycodone; statins; sulfa antibiotics; tape; cortisone; latex; and tartrazine.  Meds: Current Outpatient Medications  Medication Sig Dispense Refill  . apixaban (ELIQUIS) 5 MG TABS tablet Take 1 tablet (5 mg total) by mouth 2 (two) times daily. PT NEEDS APPT, CALL [phone] TO SCHEDULE, 1ST ATTEMPT 60 tablet 0  . citalopram (CELEXA) 20 MG tablet Take 10 mg daily by mouth.     . diltiazem (CARDIZEM) 30 MG tablet Take 30 mg by mouth See admin instructions. Pt to take as needed if going into AFIB    . hydrochlorothiazide (HYDRODIURIL) 25 MG tablet Take 25 mg by mouth daily.     . insulin NPH-regular Human (NOVOLIN 70/30) (70-30) 100 UNIT/ML injection Inject 48 Units into the skin 2 (two) times daily with a meal. Pt takes 48 units every morning, and 48 units every evening    . INSULIN SYRINGE 1CC/29G 29G X 1/2" 1 ML MISC     . levothyroxine (SYNTHROID) 112 MCG tablet Take 1 tablet (112 mcg total) by mouth daily before breakfast. 30 tablet 3  . losartan (COZAAR) 100 MG tablet Take 50 mg daily by mouth.     . Misc Natural Products (ESSIAC TONIC) CAPS Take 2 capsules by mouth 2 (two) times daily.    . Multiple Vitamins-Minerals (ICAPS) CAPS Take 1 capsule by mouth daily.      [name] (SYSTANE) 0.4-0.3 % SOLN Place 1 drop into both eyes daily as needed (dry eyes).     . Pramoxine-Menthol (ANTI-ITCH MEDICATION EX) Apply 1 application topically 2 (two) times daily as needed (Itching, hives). DerMend Steroid Free    . Probiotic Product (PROBIOTIC DAILY PO) Take 1 tablet by mouth daily.    . flecainide (TAMBOCOR) 100 MG tablet Take 3 tablets (300 mg total) by mouth once. AS NEEDED (Patient taking differently: Take 300 mg by mouth See admin instructions. AS NEEDED if going into AFIB) 30 tablet 3   No current facility-administered medications for this encounter.     Physical Findings: The patient is in no acute distress. Patient is alert and oriented.  height is 5\' 6"  (1.676 m) and weight is 239 lb 3.2 oz (108.5 kg). Her oral temperature is 97.9 F (36.6 C). Her blood pressure is 140/62 and her pulse is 62. Her oxygen saturation is 100%.   No palpable cervical, supraclavicular or axillary lymphoadenopathy. The heart has a regular rate and rhythm. The lungs are clear to auscultation. Abdomen soft and non-tender.  On pelvic examination the external genitalia were unremarkable. A speculum exam was performed. Vaginal cuff intact, no mucosal lesions. On bimanual exam there were no pelvic masses appreciated.  Lab Findings: Lab Results  Component Value Date   WBC 7.8 02/20/2017   HGB 11.5 (L)  02/20/2017   HCT 35.4 (L) 02/20/2017   MCV 94.1 02/20/2017   PLT 234 02/20/2017    Radiographic Findings: No results found.  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 The patient will be treated with a 3 cm diameter cylinder with a treatment length of 3.5 cm. This distended the vaginal vault without undue discomfort. The patient tolerated the procedure well.  The patient was successfully fitted for a vaginal cylinder. The patient is appropriate to begin vaginal brachytherapy.   Plan: The  patient will proceed with CT simulation and vaginal brachytherapy today.    _______________________________   Blair Promise, PhD, MD This document serves as a record of services personally performed by Gery Pray, MD. It was created on his behalf by Valeta Harms, a trained medical scribe. The creation of this record is based on the scribe's personal observations and the provider's statements to them. This document has been checked and approved by the attending provider.

## 2017-04-04 NOTE — Progress Notes (Signed)
  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 She will be treated with a 3 cm diameter segmented cylinder. This conforms to her anatomy without undue discomfort.  Vaginal brachytherapy procedure node: The patient was brought to the [place] suite. Identity was confirmed. All relevant records and images related to the planned course of therapy were reviewed. The patient freely provided informed written consent to proceed with treatment after reviewing the details related to the planned course of therapy. The consent form was witnessed and verified by the simulation staff. Then, the patient was set-up in a stable reproducible supine position for radiation therapy. Pelvic exam revealed the vaginal cuff to be intact. The patient's custom vaginal cylinder was placed in the proximal vagina. This was affixed to the CT/MR stabilization plate to prevent slippage. Patient tolerated the placement well.  Verification simulation note:  A fiducial marker was placed within the vaginal cylinder. An AP and lateral film was then obtained through the pelvis area. This documented accurate position of the vaginal cylinder for treatment.  HDR BRACHYTHERAPY TREATMENT  The remote afterloading device was affixed to the vaginal cylinder by catheter. Patient then proceeded to undergo her first high-dose-rate treatment directed at the proximal vagina. The patient was prescribed a dose of 6 gray to be delivered to the mucosal surface. Treatment length was 3.5 cm. Patient was treated with 1 channel using 7 dwell positions. Treatment time was 307 seconds. Iridium 192 was the high-dose-rate source for treatment. The  patient tolerated the treatment well. After completion of her therapy, a radiation survey was performed documenting return of the iridium source into the Gamma[employer] safe.   PLAN: She will return next week for her second high dose treatment.   -----------------------------------  Blair Promise, PhD, MD  This document serves as a record of services personally performed by Gery Pray, MD. It was created on his behalf by Valeta Harms, a trained medical scribe. The creation of this record is based on the scribe's personal observations and the provider's statements to them. This document has been checked and approved by the attending provider.

## 2017-04-09 DIAGNOSIS — M79671 Pain in right foot: Secondary | ICD-10-CM | POA: Diagnosis not present

## 2017-04-09 DIAGNOSIS — M25579 Pain in unspecified ankle and joints of unspecified foot: Secondary | ICD-10-CM | POA: Diagnosis not present

## 2017-04-10 ENCOUNTER — Telehealth: Payer: Self-pay | Admitting: *Deleted

## 2017-04-10 NOTE — Telephone Encounter (Signed)
CALLED PATIENT TO REMIND OF HDR TX. FOR 04-11-17 @ 10 AM, LVM FOR A RETURN CALL

## 2017-04-11 ENCOUNTER — Ambulatory Visit
Admission: RE | Admit: 2017-04-11 | Discharge: 2017-04-11 | Disposition: A | Discharge status: Home / Self Care | Payer: Medicare Other | Source: Ambulatory Visit | Attending: Radiation Oncology | Admitting: Radiation Oncology

## 2017-04-11 DIAGNOSIS — C541 Malignant neoplasm of endometrium: Secondary | ICD-10-CM

## 2017-04-11 DIAGNOSIS — Z87442 Personal history of urinary calculi: Secondary | ICD-10-CM | POA: Diagnosis not present

## 2017-04-11 DIAGNOSIS — E119 Type 2 diabetes mellitus without complications: Secondary | ICD-10-CM | POA: Diagnosis not present

## 2017-04-11 DIAGNOSIS — Z90710 Acquired absence of both cervix and uterus: Secondary | ICD-10-CM | POA: Diagnosis not present

## 2017-04-11 DIAGNOSIS — F419 Anxiety disorder, unspecified: Secondary | ICD-10-CM | POA: Diagnosis not present

## 2017-04-11 DIAGNOSIS — Z90722 Acquired absence of ovaries, bilateral: Secondary | ICD-10-CM | POA: Diagnosis not present

## 2017-04-11 NOTE — Progress Notes (Signed)
  Radiation Oncology         [phone] ________________________________  Name: RUNETTE SCIFRES MRN: [id number]  Date: 04/11/2017  DOB: 01/31/1946  CC: Caryl Bis, MD  Everitt Amber, MD  HDR BRACHYTHERAPY NOTE  DIAGNOSIS: 70 y.o. female with Stage I-A grade2endometrioid endometrialcancer   Simple treatment device note: Patient had construction of her custom vaginal cylinder. She will be treated with a 3 cm diameter segmented cylinder. This conforms to her anatomy without undue discomfort.  Vaginal brachytherapy procedure node: The patient was brought to the [facility] suite. Identity was confirmed. All relevant records and images related to the planned course of therapy were reviewed. The patient freely provided informed written consent to proceed with treatment after reviewing the details related to the planned course of therapy. The consent form was witnessed and verified by the simulation staff. Then, the patient was set-up in a stable reproducible supine position for radiation therapy. Pelvic exam revealed the vaginal cuff to be intact. The patient's custom vaginal cylinder was placed in the proximal vagina. This was affixed to the CT/MR stabilization plate to prevent slippage. Patient tolerated the placement well.  Verification simulation note:  A fiducial marker was placed within the vaginal cylinder. An AP and lateral film was then obtained through the pelvis area. This documented accurate position of the vaginal cylinder for treatment.  HDR BRACHYTHERAPY TREATMENT  The remote afterloading device was affixed to the vaginal cylinder by catheter. Patient then proceeded to undergo her second high-dose-rate treatment directed at the proximal vagina. The patient was prescribed a dose of 6 gray to be delivered to the mucosal surface. Treatment length was 3.5 cm. Patient was treated with 1 channel using 7 dwell positions. Treatment time was 327.6 seconds. Iridium 192 was the high-dose-rate source  for treatment. The patient tolerated the treatment well. After completion of her therapy, a radiation survey was performed documenting return of the iridium source into the GammaMed safe.   PLAN: She will return on 04/18/2017 for her third HDR treatment. ________________________________   Blair Promise, PhD, MD  This document serves as a record of services personally performed by Gery Pray, MD. It was created on his behalf by Rae Lips, a trained medical scribe. The creation of this record is based on the scribe's personal observations and the provider's statements to them. This document has been checked and approved by the attending provider.

## 2017-04-17 ENCOUNTER — Telehealth: Payer: Self-pay | Admitting: *Deleted

## 2017-04-17 NOTE — Telephone Encounter (Signed)
Called patient to remind of [facility]. For 04-18-17 @ 10 am, lvm for a return call

## 2017-04-18 ENCOUNTER — Ambulatory Visit
Admission: RE | Admit: 2017-04-18 | Discharge: 2017-04-18 | Disposition: A | Discharge status: Home / Self Care | Payer: Medicare Other | Source: Ambulatory Visit | Attending: Radiation Oncology | Admitting: Radiation Oncology

## 2017-04-18 DIAGNOSIS — E119 Type 2 diabetes mellitus without complications: Secondary | ICD-10-CM | POA: Diagnosis not present

## 2017-04-18 DIAGNOSIS — Z90710 Acquired absence of both cervix and uterus: Secondary | ICD-10-CM | POA: Diagnosis not present

## 2017-04-18 DIAGNOSIS — C541 Malignant neoplasm of endometrium: Secondary | ICD-10-CM | POA: Diagnosis not present

## 2017-04-18 DIAGNOSIS — Z90722 Acquired absence of ovaries, bilateral: Secondary | ICD-10-CM | POA: Diagnosis not present

## 2017-04-18 DIAGNOSIS — F419 Anxiety disorder, unspecified: Secondary | ICD-10-CM | POA: Diagnosis not present

## 2017-04-18 DIAGNOSIS — Z87442 Personal history of urinary calculi: Secondary | ICD-10-CM | POA: Diagnosis not present

## 2017-04-18 NOTE — Progress Notes (Signed)
  Radiation Oncology         [phone] ________________________________  Name: Alicia [name] MRN: [id number]  Date: 04/18/2017  DOB: Jan 16, 1946  CC: Caryl Bis, MD  Everitt Amber, MD  HDR BRACHYTHERAPY NOTE  DIAGNOSIS: 71 y.o. female with Stage I-A grade2endometrioid endometrialcancer   Simple treatment device note: Patient had construction of her custom vaginal cylinder. She will be treated with a 3 cm diameter segmented cylinder. This conforms to her anatomy without undue discomfort.  Vaginal brachytherapy procedure node: The patient was brought to the [facility] suite. Identity was confirmed. All relevant records and images related to the planned course of therapy were reviewed. The patient freely provided informed written consent to proceed with treatment after reviewing the details related to the planned course of therapy. The consent form was witnessed and verified by the simulation staff. Then, the patient was set-up in a stable reproducible supine position for radiation therapy. Pelvic exam revealed the vaginal cuff to be intact. The patient's custom vaginal cylinder was placed in the proximal vagina. This was affixed to the CT/MR stabilization plate to prevent slippage. Patient tolerated the placement well.  Verification simulation note:  A fiducial marker was placed within the vaginal cylinder. An AP and lateral film was then obtained through the pelvis area. This documented accurate position of the vaginal cylinder for treatment.  HDR BRACHYTHERAPY TREATMENT  The remote afterloading device was affixed to the vaginal cylinder by catheter. Patient then proceeded to undergo her third high-dose-rate treatment directed at the proximal vagina. The patient was prescribed a dose of 6 gray to be delivered to the mucosal surface. Treatment length was 3.5 cm. Patient was treated with 1 channel using 7 dwell positions. Treatment time was 350.00 seconds. Iridium 192 was the high-dose-rate source  for treatment. The patient tolerated the treatment well. After completion of her therapy, a radiation survey was performed documenting return of the iridium source into the GammaMed safe.   PLAN: She will return next week for her fourth HDR treatment.    ________________________________   Blair Promise, PhD, MD  This document serves as a record of services personally performed by Gery Pray, MD. It was created on his behalf by Marlowe Kays, a trained medical scribe. The creation of this record is based on the scribe's personal observations and the provider's statements to them. This document has been checked and approved by the attending provider.

## 2017-04-19 ENCOUNTER — Telehealth: Payer: Self-pay | Admitting: *Deleted

## 2017-04-19 NOTE — Telephone Encounter (Signed)
Called patient to remind of HDR Tx. For 04-22-17 @ 10 am, spoke with patient and she is aware of this tx.

## 2017-04-22 ENCOUNTER — Ambulatory Visit
Admission: RE | Admit: 2017-04-22 | Discharge: 2017-04-22 | Disposition: A | Discharge status: Home / Self Care | Payer: Medicare Other | Source: Ambulatory Visit | Attending: Radiation Oncology | Admitting: Radiation Oncology

## 2017-04-22 DIAGNOSIS — Z90710 Acquired absence of both cervix and uterus: Secondary | ICD-10-CM | POA: Diagnosis not present

## 2017-04-22 DIAGNOSIS — Z90722 Acquired absence of ovaries, bilateral: Secondary | ICD-10-CM | POA: Diagnosis not present

## 2017-04-22 DIAGNOSIS — Z87442 Personal history of urinary calculi: Secondary | ICD-10-CM | POA: Diagnosis not present

## 2017-04-22 DIAGNOSIS — E119 Type 2 diabetes mellitus without complications: Secondary | ICD-10-CM | POA: Diagnosis not present

## 2017-04-22 DIAGNOSIS — C541 Malignant neoplasm of endometrium: Secondary | ICD-10-CM

## 2017-04-22 DIAGNOSIS — F419 Anxiety disorder, unspecified: Secondary | ICD-10-CM | POA: Diagnosis not present

## 2017-04-22 NOTE — Progress Notes (Signed)
  Radiation Oncology         [phone] ________________________________  Name: Alicia [name] MRN: [id number]  Date: 04/22/2017  DOB: 1945/07/20  CC: Caryl Bis, MD  Everitt Amber, MD  HDR BRACHYTHERAPY NOTE  DIAGNOSIS: 71 y.o. female with Stage I-A grade2endometrioid endometrialcancer    Simple treatment device note: Patient had construction of her custom vaginal cylinder. She will be treated with a 3.0 cm diameter segmented cylinder. This conforms to her anatomy without undue discomfort.  Vaginal brachytherapy procedure node: The patient was brought to the [facility] suite. Identity was confirmed. All relevant records and images related to the planned course of therapy were reviewed. The patient freely provided informed written consent to proceed with treatment after reviewing the details related to the planned course of therapy. The consent form was witnessed and verified by the simulation staff. Then, the patient was set-up in a stable reproducible supine position for radiation therapy. Pelvic exam revealed the vaginal cuff to be intact . The patient's custom vaginal cylinder was placed in the proximal vagina. This was affixed to the CT/MR stabilization plate to prevent slippage. Patient tolerated the placement well.  Verification simulation note:  A fiducial marker was placed within the vaginal cylinder. An AP and lateral film was then obtained through the pelvis area. This documented accurate position of the vaginal cylinder for treatment.  HDR BRACHYTHERAPY TREATMENT  The remote afterloading device was affixed to the vaginal cylinder by catheter. Patient then proceeded to undergo her 4th high-dose-rate treatment directed at the proximal vagina. The patient was prescribed a dose of 6.0 gray to be delivered to the mucosal surface. Treatment length was 3.5 cm. Patient was treated with 1 channel using 7 dwell positions. Treatment time was 363.2 seconds. Iridium 192 was the high-dose-rate  source for treatment. The patient tolerated the treatment well. After completion of her therapy, a radiation survey was performed documenting return of the iridium source into the GammaMed safe.   PLAN: Patient will return later this week for her fifth and final high-dose-rate treatment. ________________________________  Blair Promise, PhD, MD

## 2017-04-24 ENCOUNTER — Telehealth: Payer: Self-pay | Admitting: *Deleted

## 2017-04-24 DIAGNOSIS — M25571 Pain in right ankle and joints of right foot: Secondary | ICD-10-CM | POA: Diagnosis not present

## 2017-04-24 DIAGNOSIS — S99921A Unspecified injury of right foot, initial encounter: Secondary | ICD-10-CM | POA: Diagnosis not present

## 2017-04-24 DIAGNOSIS — S99811A Other specified injuries of right ankle, initial encounter: Secondary | ICD-10-CM | POA: Diagnosis not present

## 2017-04-24 NOTE — Telephone Encounter (Signed)
Called patient to remind of HDR Tx. for 04-25-17 @ 10 am, spoke with patient and she is aware of this tx.

## 2017-04-25 ENCOUNTER — Ambulatory Visit
Admission: RE | Admit: 2017-04-25 | Discharge: 2017-04-25 | Disposition: A | Discharge status: Home / Self Care | Payer: Medicare Other | Source: Ambulatory Visit | Attending: Radiation Oncology | Admitting: Radiation Oncology

## 2017-04-25 ENCOUNTER — Encounter: Payer: Self-pay | Admitting: Radiation Oncology

## 2017-04-25 DIAGNOSIS — Z90710 Acquired absence of both cervix and uterus: Secondary | ICD-10-CM | POA: Diagnosis not present

## 2017-04-25 DIAGNOSIS — C541 Malignant neoplasm of endometrium: Secondary | ICD-10-CM | POA: Diagnosis not present

## 2017-04-25 DIAGNOSIS — Z87442 Personal history of urinary calculi: Secondary | ICD-10-CM | POA: Diagnosis not present

## 2017-04-25 DIAGNOSIS — E119 Type 2 diabetes mellitus without complications: Secondary | ICD-10-CM | POA: Diagnosis not present

## 2017-04-25 DIAGNOSIS — F419 Anxiety disorder, unspecified: Secondary | ICD-10-CM | POA: Diagnosis not present

## 2017-04-25 DIAGNOSIS — Z90722 Acquired absence of ovaries, bilateral: Secondary | ICD-10-CM | POA: Diagnosis not present

## 2017-04-25 NOTE — Progress Notes (Signed)
  Radiation Oncology         (336) [phone] ________________________________  Name: Alicia [name] MRN: [id number]  Date: 04/25/2017  DOB: 1946-03-20  End of Treatment Note  Diagnosis:   71 y.o. female with Stage I-A grade2endometrioid endometrialcancer     Indication for treatment:  Curative       Radiation treatment dates:   04/04/2017, 04/11/2017, 04/18/2017, 04/22/2017, 04/25/2017  Site/dose:   The vagina cuff was treated to 30 Gy in 5 fractions of 6 Gy.  Beams/energy:   HDR Ir-192 Vaginal Brachytherapy  Narrative: The patient tolerated radiation treatment relatively well.   She denied any significant side effects.  Plan: The patient has completed radiation treatment. The patient will return to radiation oncology clinic for routine followup in one month. I advised them to call or return sooner if they have any questions or concerns related to their recovery or treatment.  -----------------------------------  Blair Promise, PhD, MD  This document serves as a record of services personally performed by Gery Pray, MD. It was created on his behalf by Rae Lips, a trained medical scribe. The creation of this record is based on the scribe's personal observations and the provider's statements to them. This document has been checked and approved by the attending provider.

## 2017-04-25 NOTE — Progress Notes (Addendum)
  Radiation Oncology         ([phone] ________________________________  Name: Alicia [name] MRN: [id number]  Date: 04/25/2017  DOB: 03-May-1946  CC: Caryl Bis, MD  Everitt Amber, MD  HDR BRACHYTHERAPY NOTE  DIAGNOSIS: 71 y.o. female with Stage I-A grade2endometrioid endometrialcancer   Simple treatment device note: Patient had construction of her custom vaginal cylinder. She will be treated with a 3.0 cm diameter segmented cylinder. This conforms to her anatomy without undue discomfort.  Vaginal brachytherapy procedure node: The patient was brought to the [facility] suite. Identity was confirmed. All relevant records and images related to the planned course of therapy were reviewed. The patient freely provided informed written consent to proceed with treatment after reviewing the details related to the planned course of therapy. The consent form was witnessed and verified by the simulation staff. Then, the patient was set-up in a stable reproducible supine position for radiation therapy. Pelvic exam revealed the vaginal cuff to be intact. The patient's custom vaginal cylinder was placed in the proximal vagina. This was affixed to the CT/MR stabilization plate to prevent slippage. Patient tolerated the placement well.  Verification simulation note:  A fiducial marker was placed within the vaginal cylinder. An AP and lateral film was then obtained through the pelvis area. This documented accurate position of the vaginal cylinder for treatment.  HDR BRACHYTHERAPY TREATMENT  The remote afterloading device was affixed to the vaginal cylinder by catheter. Patient then proceeded to undergo her 5th and final high-dose-rate treatment directed at the proximal vagina. The patient was prescribed a dose of 6 gray to be delivered to the mucosal surface. Treatment length was 3.5 cm. Patient was treated with 1 channel using 7 dwell positions. Treatment time was 373.40 seconds. Iridium 192 was the  high-dose-rate source for treatment. The patient tolerated the treatment well. After completion of her therapy, a radiation survey was performed documenting return of the iridium source into the Gamma[employer] safe.   PLAN: The patient has completed HDR treatment. She will return for routine follow-up in 4-6 weeks. ________________________________   Blair Promise, PhD, MD  This document serves as a record of services personally performed by Gery Pray, MD. It was created on his behalf by Rae Lips, a trained medical scribe. The creation of this record is based on the scribe's personal observations and the provider's statements to them. This document has been checked and approved by the attending provider.

## 2017-04-29 ENCOUNTER — Ambulatory Visit (HOSPITAL_BASED_OUTPATIENT_CLINIC_OR_DEPARTMENT_OTHER): Payer: Medicare Other | Admitting: Genetics

## 2017-04-29 ENCOUNTER — Other Ambulatory Visit: Payer: Medicare Other

## 2017-04-29 ENCOUNTER — Encounter: Payer: Self-pay | Admitting: Genetics

## 2017-04-29 DIAGNOSIS — C541 Malignant neoplasm of endometrium: Secondary | ICD-10-CM

## 2017-04-29 DIAGNOSIS — Z803 Family history of malignant neoplasm of breast: Secondary | ICD-10-CM | POA: Insufficient documentation

## 2017-04-29 DIAGNOSIS — Z7183 Encounter for nonprocreative genetic counseling: Secondary | ICD-10-CM

## 2017-04-29 DIAGNOSIS — Z809 Family history of malignant neoplasm, unspecified: Secondary | ICD-10-CM

## 2017-04-29 DIAGNOSIS — Z800 Family history of malignant neoplasm of digestive organs: Secondary | ICD-10-CM | POA: Diagnosis not present

## 2017-04-29 DIAGNOSIS — R8789 Other abnormal findings in specimens from female genital organs: Secondary | ICD-10-CM | POA: Diagnosis not present

## 2017-04-29 DIAGNOSIS — Z8542 Personal history of malignant neoplasm of other parts of uterus: Secondary | ICD-10-CM | POA: Diagnosis not present

## 2017-04-29 DIAGNOSIS — Z808 Family history of malignant neoplasm of other organs or systems: Secondary | ICD-10-CM | POA: Diagnosis not present

## 2017-04-29 NOTE — Progress Notes (Signed)
REFERRING PROVIDER: Dorothyann Gibbs, NP [place], [place] [address]  PRIMARY PROVIDER:  Caryl Bis, MD  PRIMARY REASON FOR VISIT:  1. Endometrial cancer ([place])   2. Family history of cancer   3. Family history of breast cancer     HISTORY OF PRESENT ILLNESS:   Alicia [name], a 71 y.o. female, was seen for a [facility] cancer genetics consultation at the request of Dr. Elinor Parkinson due to a personal and familyhistory of cancer.  Alicia [name] presents to clinic today to discuss the possibility of a hereditary predisposition to cancer, genetic testing, and to further clarify her future cancer risks, as well as potential cancer risks for family members.   In 2018, at the age of 21, Alicia [name] was diagnosed with grade 2 endometrial carcinoma.  She had a total hysterectomy with BSO on 02/19/2017.  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   Alicia [name] also has a history of a non-melanoma skin cancer.   CANCER HISTORY:    Endometrial ca ([place])   12/31/2016 Initial Diagnosis    Endometrial ca ([place])        HORMONAL RISK FACTORS:  Menarche was at age Pt does not remember.  First live birth at age 14.  Ovaries intact: no.  Hysterectomy: yes.  Menopausal status: postmenopausal.  HRT use: 0 years. Colonoscopy: yes; 2003- reportedly normal, might have had 1 other- reportedly normal. Number of breast biopsies: 1. Pt reports she had 3 lumps removed in the 1980's in her left breast that were all benign.      Past Medical History:  Diagnosis Date  . Anemia    hx of  . Anxiety   . Cancer [facility])    endometrial cancer/ skin cancers removed nose non melanoma  . Diabetes ([place])    Type 2  . DJD (degenerative joint disease)   . Dysrhythmia    SYMPTOMATIC BRADYCARDIA  / Afib  . Family history of breast cancer   . Family history of cancer   . Gallstones   . History of kidney stones    Hx of   . Hypertension    HCTZ  .  Hypothyroidism   . Obesity   . 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 Sleep apnea    No machine Cant tolerate  . Wears glasses     Past Surgical History:  Procedure Laterality Date  . ANKLE SURGERY Right    multiple  . BREAST SURGERY     biopsy L breast benign  . CHOLECYSTECTOMY    . COLONOSCOPY    . EYE SURGERY     both cataracts  . FOOT ARTHROTOMY     left  . FOOT SURGERY     Bil  bone spurs /Left  foot neuroma removed Right foot sinus tarside surgery Right bone in toe removed  . KNEE SURGERY     right  . 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 PERMANENT PACEMAKER INSERTION N/A 12/15/2012   SJM 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 by Dr Lovena Le for transient AV block  . ROBOTIC ASSISTED TOTAL HYSTERECTOMY WITH BILATERAL SALPINGO OOPHERECTOMY N/A 02/19/2017   Procedure: XI ROBOTIC ASSISTED TOTAL HYSTERECTOMY WITH BILATERAL SALPINGO OOPHORECTOMY;  Surgeon: Everitt Amber, MD;  Location: [facility];  Service: Gynecology;  Laterality: N/A;  .  SENTINEL NODE BIOPSY N/A 02/19/2017   Procedure: SENTINEL NODE BIOPSY;  Surgeon: Everitt Amber, MD;  Location: [facility];  Service: Gynecology;  Laterality: N/A;  . SHOULDER ARTHROSCOPY WITH SUBACROMIAL DECOMPRESSION AND OPEN ROTATOR C Right 07/07/2013   Procedure: RIGHT SHOULDER ARTHROSCOPY WITH SUBACROMIAL DECOMPRESSION AND ROTATOR CUFF DEBRIDEMENT/ DISTAL CLAVICLE RESECTION;  Surgeon: [name], MD;  Location: [facility];  Service: Orthopedics;  Laterality: Right;  . SHOULDER SURGERY Right 2012    Social History   Socioeconomic History  . Marital status: Married    Spouse name: None  . Number of children: 2  . Years of education: None  . Highest education level: None  Social Needs  . Financial resource strain: None  . Food insecurity - worry: None  . Food insecurity - inability: None  . Transportation needs - medical: None  .  Transportation needs - non-medical: None  Occupational History  . Occupation: retired  Tobacco Use  . Smoking status: Never Smoker  . Smokeless tobacco: Never Used  Substance and Sexual Activity  . Alcohol use: No    Alcohol/week: 0.0 oz  . Drug use: No  . Sexual activity: Not Currently    Birth control/protection: Post-menopausal  Other Topics Concern  . None  Social History Narrative   Retired and lives in [place] with spouse     FAMILY HISTORY:  We obtained a detailed, 4-generation family history.  Significant diagnoses are listed below: Family History  Problem Relation Age of Onset  . Sudden death Brother        age 52s, died while playing raquetball  . Stroke Mother   . Cancer Father        throat  . Tongue cancer Father   . Heart disease Maternal Grandmother   . Cancer Maternal Aunt        type unk,hx smoking  . Cancer Maternal Uncle        type unk, hx smok  . Cancer Paternal Aunt 37       thinks it was GI/colon- had hole in side and colostomy bag  . Cancer Maternal Aunt        type unk, had a colostomy, hx smoking  . Cancer Maternal Uncle        cancer maybe-?type unk  . Throat cancer Maternal Uncle        hx smoking  . Heart attack Maternal Uncle 50  . Cancer Cousin        type unk- died in 13's/50's  . Cancer Cousin        type unk, died in 47's/50's  . Breast cancer Cousin        x2  . Other Other 25       brain tumor  . Allergic rhinitis Neg Hx   . Angioedema Neg Hx   . Asthma Neg Hx   . Atopy Neg Hx   . Eczema Neg Hx   . Immunodeficiency Neg Hx   . Urticaria Neg Hx    Alicia [name] has a 78 year-old daughter with no history of cancer and no children.  Alicia [name] also has a 46 year-old son who has no history of cancer.  He has a 60 year-old son and a 63 year-old daughter. Alicia [name] has a brother who died in his 71's due to sudden death.  He had 2 children and some grandchildren.    Alicia [name] father died at 51 due to throat and tongue cancer.  He had a  history of smoking.  Alicia [name] reports that she has several paternal uncles and aunts, but does not know any health information about most of them.  She reports that 1 paternal aunt was diagnsoed with cancer in her 61's and thinks it was a colon/GI cancer because she had a large hole in her side and a colostomy bag.  Her paternal grandparents died due to heart problems.    Alicia [name] mother died at 45 due to a stroke.  Alicia [name] had 8 maternal aunts and uncles: -1 maternal uncle died at age 77.  -24 maternal uncle died at age 15.  -1 maternal uncle might have had cancer.  He had several children,2 of his daughters died of cancer in their 59's/50's- the type of cancer is unk.  -1 maternal uncle died of throat cancer and had a history of smoking.  -1 maternal uncle died of cancer- type and age of dx is unknown.  -1 maternal uncle died at 15 due to a heart attack.   -1 maternal aunt died of cancer- type and age of dx unknown.  This aunt had a daughter who had breast cancer twice.  She had a son who had a brain tumor at age 51.  -1 maternal aunt died of cancer and had a history of smoking.  Type and age of dx unk.  Alicia [name] has several other maternal cousins, no others with any known history of cancer.  Alicia [name] maternal grandparents did not have a history of cancer.     Alicia [name] is unaware of previous family history of genetic testing for hereditary cancer risks. Patient's maternal ancestors are of English/Scottish descent, and paternal ancestors are of English/Scottish descent. There is no reported Ashkenazi Jewish ancestry. There is no known consanguinity.  GENETIC COUNSELING ASSESSMENT: Alicia [name] is a 71 y.o. female with a personal and family which is somewhat suggestive of a Hereditary Cancer Predisposition Syndrome. We, therefore, discussed and recommended the following at today's visit.   DISCUSSION: We reviewed the characteristics, features and inheritance patterns of hereditary cancer  syndromes. We also discussed genetic testing, including the appropriate family members to test, the process of testing, insurance coverage and turn-around-time for results. We discussed the implications of a negative, positive and/or variant of uncertain significant result. We recommended Alicia [name] pursue genetic testing for the TumorNext Lynch + Cancer next gene panel. The CancerNext gene panel offered by [employer] includes sequencing and rearrangement analysis for the following 32 genes:   APC, ATM, BARD1, BMPR1A, BRCA1, BRCA2, BRIP1, CDH1, CDK4, CDKN2A, CHEK2, EPCAM, GREM1, MLH1, MRE11A, MSH2, MSH6, MUTYH, NBN, NF1, PALB2, PMS2, POLD1, POLE, PTEN, RAD50, RAD51D, SMAD4, SMARCA4, STK11, and TP53.   Tumor next: Studies have shown that 45-68% of cases with unexplained MSI-H and/or abnormal IHC have double somatic mutations in the MMR genes.  As a result, tumor sequencing may be helpful for individuals with tumor testing showing deficient MMR and no germline mutation detected. If double somatic mutations are identified, Lynch syndrome is ruled out but there may still be some increased familial risk. If double somatic mutations are identified, or if the testing does not help clarify the result, it is recommended that these patients and their close relatives be managed based on their family history and NOT as if they have Lynch syndrome.  If only one somatic mutation is found, the unidentified mutation could either be germline or somatic.  If no somatic mutations are found it is  possible that the [facility] results were incorrect or that none of the mutations are identifiable.  In any of these cases, the patient and their close relatives still need to be managed based on their family history.  TumorNext Lynch testing is performed in tandem with germline testing, and typically at no additional cost.   We discussed that only 5-10% of cancers are associated with a Hereditary Cancer Predisposition Syndrome.  The most  common hereditary cancer syndrome associated with endometiral cancer is Lynch Syndrome.  Lynch Syndrome is caused by mutations in the genes: MLH1, MSH2, MSH6, PMS2 and EPCAM.  This syndrome increases the risk for colon, uterine, ovarian and stomach cancers, as well as others.  Families with Lynch Syndrome tend to have multiple family members with these cancers, typically diagnosed under age 62, and diagnoses in multiple generations.  The tumors of people with Lynch Syndrome also tend to be MSI-High and have loss of the MMR genes on IHC analysis.   We discussed that there are several other genes that are associated with an increased risk for many other types of cancer.    Alicia [name] had intact Immuno-histo chemistry (IHC) performed on her tumor on 02/19/217. Her IHC was abnormal- absent MLH1 and PMS2.  so the suspicion for Lynch Syndrome is low. Her tumor was also found to be MSI-High  We discussed that if she is found to have a mutation in one of these genes, it may impact future medical management recommendations such as increased cancer screenings and consideration of risk reducing surgeries.  A positive result could also have implications for the patient's family members.  A Negative result would mean we were unable to identify a hereditary component to her cancer, but does not rule out the possibility of a hereditary basis for her cancer.  There could be mutations that are undetectable by current technology, or in genes not yet tested or identified to increase cancer risk.    We discussed the potential to find a Variant of Uncertain Significance or VUS.  These are variants that have not yet been identified as pathogenic or benign, and it is unknown if this variant is associated with increased cancer risk or if this is a normal finding.  Most VUS's are reclassified to benign or likely benign.   It should not be used to make medical management decisions. With time, we suspect the lab will determine the  significance of any VUS's identified if any.   Based on Ms. Hilger's personal and family history of cancer, she meets medical criteria for genetic testing. Despite that she meets criteria, she may still have an out of pocket cost. We discussed that if her out of pocket cost for testing is over $100, the laboratory will call and confirm whether she wants to proceed with testing.  If the out of pocket cost of testing is less than $100 she will be billed by the genetic testing laboratory.   PLAN: After considering the risks, benefits, and limitations, Ms. Petre  provided informed consent to pursue genetic testing and the blood sample was sent to [facility] for analysis of the TumorNext Lynch+CancerNext Panel. Results should be available within approximately 1 month's time, at which point they will be disclosed by telephone to Ms. Chadderdon, as will any additional recommendations warranted by these results. Ms. Tarquinio will receive a summary of her genetic counseling visit and a copy of her results once available. This information will also be available in Epic. We encouraged Ms. [employer]  to remain in contact with cancer genetics annually so that we can continuously update the family history and inform her of any changes in cancer genetics and testing that may be of benefit for her family. Ms. Burlison questions were answered to her satisfaction today. Our contact information was provided should additional questions or concerns arise.  Based on Ms. Rahming's family history, her paternal relatives may meet medical criteria (due to the aunt with GI/colon cancer in her 84's). Ms. Fakhouri will let us know if we can be of any assistance in coordinating genetic counseling and/or testing for this family member.   Lastly, we encouraged Ms. Dinkins to remain in contact with cancer genetics annually so that we can continuously update the family history and inform her of any changes in cancer genetics and testing that may be of  benefit for this family.   Ms.  Wands questions were answered to her satisfaction today. Our contact information was provided should additional questions or concerns arise. Thank you for the referral and allowing [place] to share in the care of your patient.   Tana Felts, MS Genetic Counselor [email or web address] phone: [phone]  The patient was seen for a total of 40 minutes in face-to-face genetic counseling. The patient was accompanied today by her husband.

## 2017-05-02 ENCOUNTER — Encounter: Payer: Self-pay | Admitting: Radiation Oncology

## 2017-05-13 ENCOUNTER — Telehealth: Payer: Self-pay | Admitting: Genetics

## 2017-05-13 ENCOUNTER — Ambulatory Visit (INDEPENDENT_AMBULATORY_CARE_PROVIDER_SITE_OTHER): Payer: Medicare Other | Admitting: *Deleted

## 2017-05-13 DIAGNOSIS — I441 Atrioventricular block, second degree: Secondary | ICD-10-CM

## 2017-05-13 NOTE — Telephone Encounter (Signed)
Ms. Bunker called to let me know more information she learned about the family history.  She states that her maternal cousins cancer types were: 1 had uterine cancer at 15, the other had a neuroendocrine tumor in her uterus.  These cousin's sister had genetic testing that was reportedly negative- [facility] Common Hereditary Cancer Panel. Her uncle victor had bladder cancer, and her other uncles with cancer had lung and throat cancer.  I informed her that her sample is moving through and I saw an update that part of the OOP cost was posted at $0 for TumorNext Lynch+ cancer next- no cost posted for MLH1 promoter hypomethylation study.

## 2017-05-13 NOTE — Progress Notes (Signed)
Remote pacemaker transmission.   

## 2017-05-14 ENCOUNTER — Encounter: Payer: Self-pay | Admitting: Cardiology

## 2017-05-20 LAB — CUP PACEART REMOTE DEVICE CHECK
Brady Statistic AP VP Percent: 1 %
Brady Statistic AP VS Percent: 47 %
Brady Statistic AS VP Percent: 1 %
Brady Statistic RV Percent Paced: 1.1 %
Implantable Lead Implant Date: 20140811
Implantable Lead Location: 753860
Implantable Pulse Generator Implant Date: 20140811
Lead Channel Impedance Value: 400 Ohm
Lead Channel Pacing Threshold Amplitude: 0.625 V
Lead Channel Sensing Intrinsic Amplitude: 12 mV
Lead Channel Setting Pacing Amplitude: 1.5 V
Lead Channel Setting Pacing Pulse Width: 0.4 ms
Lead Channel Setting Sensing Sensitivity: 2 mV
MDC IDC LEAD IMPLANT DT: 20140811
MDC IDC LEAD LOCATION: 753859
MDC IDC MSMT BATTERY REMAINING LONGEVITY: 134 mo
MDC IDC MSMT BATTERY REMAINING PERCENTAGE: 95.5 %
MDC IDC MSMT BATTERY VOLTAGE: 3.01 V
MDC IDC MSMT LEADCHNL RA IMPEDANCE VALUE: 430 Ohm
MDC IDC MSMT LEADCHNL RA PACING THRESHOLD AMPLITUDE: 0.5 V
MDC IDC MSMT LEADCHNL RA PACING THRESHOLD PULSEWIDTH: 0.4 ms
MDC IDC MSMT LEADCHNL RA SENSING INTR AMPL: 2.9 mV
MDC IDC MSMT LEADCHNL RV PACING THRESHOLD PULSEWIDTH: 0.4 ms
MDC IDC SESS DTM: 20190107070015
MDC IDC SET LEADCHNL RV PACING AMPLITUDE: 0.875
MDC IDC STAT BRADY AS VS PERCENT: 52 %
MDC IDC STAT BRADY RA PERCENT PACED: 47 %
Pulse Gen Model: 2240
Pulse Gen Serial Number: 7528878

## 2017-05-21 ENCOUNTER — Other Ambulatory Visit: Payer: Self-pay | Admitting: Gynecologic Oncology

## 2017-05-21 DIAGNOSIS — C541 Malignant neoplasm of endometrium: Secondary | ICD-10-CM

## 2017-05-24 ENCOUNTER — Encounter: Payer: Self-pay | Admitting: Oncology

## 2017-05-27 ENCOUNTER — Ambulatory Visit
Admission: RE | Admit: 2017-05-27 | Discharge: 2017-05-27 | Disposition: A | Discharge status: Home / Self Care | Payer: Medicare Other | Source: Ambulatory Visit | Attending: Radiation Oncology | Admitting: Radiation Oncology

## 2017-05-27 ENCOUNTER — Other Ambulatory Visit: Payer: Self-pay

## 2017-05-27 ENCOUNTER — Encounter: Payer: Self-pay | Admitting: Radiation Oncology

## 2017-05-27 VITALS — BP 146/77 | HR 61 | Temp 97.7°F | Ht 66.0 in | Wt 244.0 lb

## 2017-05-27 DIAGNOSIS — Z08 Encounter for follow-up examination after completed treatment for malignant neoplasm: Secondary | ICD-10-CM | POA: Diagnosis not present

## 2017-05-27 DIAGNOSIS — R197 Diarrhea, unspecified: Secondary | ICD-10-CM | POA: Insufficient documentation

## 2017-05-27 DIAGNOSIS — C541 Malignant neoplasm of endometrium: Secondary | ICD-10-CM | POA: Diagnosis not present

## 2017-05-27 NOTE — Progress Notes (Signed)
Radiation Oncology         [phone] ________________________________  Name: Alicia [name] MRN: [id number]  Date: 05/27/2017  DOB: 01-15-1946  Follow-Up Visit Note  CC: Caryl Bis, MD  Caryl Bis, MD    ICD-10-CM   1. Endometrial cancer [facility]) C54.1     Diagnosis:   72 y.o. female with Stage I-A grade2endometrioid endometrialcancer   Interval Since Last Radiation:  1 month Radiation treatment dates:   04/04/2017, 04/11/2017, 04/18/2017, 04/22/2017, 04/25/2017 Site/dose:   The vagina cuff was treated to 30 Gy in 5 fractions of 6 Gy.  Narrative:  The patient returns today for routine follow-up accompanied by her husband. The patient tolerated radiation treatment relatively well. She denied any significant side effects. Today, she has a stress fracture in her right heel and is wearing a boot. She denies any pain. She reports having 1 episode of explosive diarrhea about every other day that started toward the end of radiation treatment and has continued with no improvement. She states that she cannot attribute her diarrhea to anything in particular. She denies any associated nausea. She reports having urinary urgency and feels pressure. She denies dysuria. She reports fatigue and is taking 4-5 hour naps. She denies vaginal bleeding but does have light brown/dark yellow vaginal discharge.                     ALLERGIES:  is allergic to ace inhibitors; cardizem [diltiazem hcl]; oxycodone; statins; sulfa antibiotics; tape; cortisone; latex; and tartrazine.  Meds: Current Outpatient Medications  Medication Sig Dispense Refill  . apixaban (ELIQUIS) 5 MG TABS tablet Take 1 tablet (5 mg total) by mouth 2 (two) times daily. PT NEEDS APPT, CALL [phone] TO SCHEDULE, 1ST ATTEMPT 60 tablet 0  . citalopram (CELEXA) 20 MG tablet Take 10 mg daily by mouth.     . diltiazem (CARDIZEM) 30 MG tablet Take 30 mg by mouth See admin instructions. Pt to take as needed if going into AFIB    .  flecainide (TAMBOCOR) 100 MG tablet Take 3 tablets (300 mg total) by mouth once. AS NEEDED (Patient taking differently: Take 300 mg by mouth See admin instructions. AS NEEDED if going into AFIB) 30 tablet 3  . hydrochlorothiazide (HYDRODIURIL) 25 MG tablet Take 25 mg by mouth daily.     . insulin NPH-regular Human (NOVOLIN 70/30) (70-30) 100 UNIT/ML injection Inject 48 Units into the skin 2 (two) times daily with a meal. Pt takes 48 units every morning, and 48 units every evening    . INSULIN SYRINGE 1CC/29G 29G X 1/2" 1 ML MISC     . levothyroxine (SYNTHROID) 112 MCG tablet Take 1 tablet (112 mcg total) by mouth daily before breakfast. 30 tablet 3  . losartan (COZAAR) 100 MG tablet Take 50 mg daily by mouth.     . Misc Natural Products (ESSIAC TONIC) CAPS Take 2 capsules by mouth 2 (two) times daily.    . Multiple Vitamins-Minerals (ICAPS) CAPS Take 1 capsule by mouth daily.     [name] (SYSTANE) 0.4-0.3 % SOLN Place 1 drop into both eyes daily as needed (dry eyes).     . Pramoxine-Menthol (ANTI-ITCH MEDICATION EX) Apply 1 application topically 2 (two) times daily as needed (Itching, hives). DerMend Steroid Free    . Probiotic Product (PROBIOTIC DAILY PO) Take 1 tablet by mouth daily.     No current facility-administered medications for this encounter.     Physical Findings:  The patient is in no acute distress. Patient is alert and oriented.  height is 5\' 6"  (1.676 m) and weight is 244 lb (110.7 kg). Her oral temperature is 97.7 F (36.5 C). Her blood pressure is 146/77 (abnormal) and her pulse is 61. Her oxygen saturation is 100%.   Lungs are clear to auscultation bilaterally. Heart has regular rate and rhythm. No palpable cervical, supraclavicular, or axillary adenopathy. Abdomen soft, non-tender, normal bowel sounds.  Pelvic exam not performed today in light of recent completion of treatment.   Lab Findings: Lab Results  Component Value Date   WBC 7.8 02/20/2017    HGB 11.5 (L) 02/20/2017   HCT 35.4 (L) 02/20/2017   MCV 94.1 02/20/2017   PLT 234 02/20/2017    Radiographic Findings: No results found.  Impression:  The patient is recovering from the effects of radiation. She has been given size S and M vaginal dilators and was instructed on how to use them. For her diarrhea, I suggested she try Imodium. It would be very unusual for vaginal brachytherapy to be causing this degree of bowel problems as only a small portion of her rectum was treated with radiation therapy.  If it does not clear up, we will refer her to GI for evaluation. I also offered a referral to nutrition, but the patient would like to try Imodium first. Also discussed consideration for bulking agents. 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  Plan:  Follow up with Dr. Denman George in 2 months. Follow up with radiation oncology in 5 months.   ____________________________________  Blair Promise, PhD, MD  This document serves as a record of services personally performed by Gery Pray, MD. It was created on his behalf by Rae Lips, a trained medical scribe. The creation of this record is based on the scribe's personal observations and the provider's statements to them. This document has been checked and approved by the attending provider.

## 2017-05-27 NOTE — Progress Notes (Signed)

## 2017-05-27 NOTE — Progress Notes (Signed)
Alicia [name] is here for follow up.  She denies having any pain.  She reports having explosive diarrhea after she eats.  She reports having urinary urgency and feels pressure.  She denies having dysuria.  She reports having fatigue and is taking 4-5 hour naps. She denies having vaginal bleeding but does have light brown/dark yellow vaginal discharge.  She has been given size S and M vaginal dilators and was instructed on how to use them.  She also has a stress fracture in her right heel and is wearing a boot.  BP (!) 146/77 (BP Location: Left Arm, Patient Position: Sitting)   Pulse 61   Temp 97.7 F (36.5 C) (Oral)   Ht 5\' 6"  (1.676 m)   Wt 244 lb (110.7 kg)   SpO2 100%   BMI 39.38 kg/m    Wt Readings from Last 3 Encounters:  05/27/17 244 lb (110.7 kg)  04/04/17 239 lb 3.2 oz (108.5 kg)  03/20/17 239 lb 3.2 oz (108.5 kg)

## 2017-05-28 ENCOUNTER — Telehealth: Payer: Self-pay | Admitting: *Deleted

## 2017-05-28 NOTE — Telephone Encounter (Signed)
Returned Shirley's call from radiation, scheduled a 2 month follow up appt for the patient on April 3rd at 2:15pm

## 2017-05-28 NOTE — Telephone Encounter (Signed)
Called patient to inform of fu with Dr. Denman George on 08-22-17 - arrival time - 1:15 pm, lvm for a return call

## 2017-05-28 NOTE — Telephone Encounter (Signed)
CALLED PATIENT TO INFORM OF APPT. WITH DR. Denman George ON 08-07-17 - ARRIVAL TIME - 2 PM, LVM FOR A RETURN CALL

## 2017-05-29 DIAGNOSIS — M79671 Pain in right foot: Secondary | ICD-10-CM | POA: Diagnosis not present

## 2017-05-29 DIAGNOSIS — S92014D Nondisplaced fracture of body of right calcaneus, subsequent encounter for fracture with routine healing: Secondary | ICD-10-CM | POA: Diagnosis not present

## 2017-06-11 DIAGNOSIS — E1151 Type 2 diabetes mellitus with diabetic peripheral angiopathy without gangrene: Secondary | ICD-10-CM | POA: Diagnosis not present

## 2017-06-11 DIAGNOSIS — L600 Ingrowing nail: Secondary | ICD-10-CM | POA: Diagnosis not present

## 2017-06-11 DIAGNOSIS — E1140 Type 2 diabetes mellitus with diabetic neuropathy, unspecified: Secondary | ICD-10-CM | POA: Diagnosis not present

## 2017-06-11 DIAGNOSIS — M79671 Pain in right foot: Secondary | ICD-10-CM | POA: Diagnosis not present

## 2017-06-11 DIAGNOSIS — M179 Osteoarthritis of knee, unspecified: Secondary | ICD-10-CM | POA: Insufficient documentation

## 2017-06-11 DIAGNOSIS — M1710 Unilateral primary osteoarthritis, unspecified knee: Secondary | ICD-10-CM | POA: Insufficient documentation

## 2017-06-12 DIAGNOSIS — M1711 Unilateral primary osteoarthritis, right knee: Secondary | ICD-10-CM | POA: Diagnosis not present

## 2017-06-20 DIAGNOSIS — M1711 Unilateral primary osteoarthritis, right knee: Secondary | ICD-10-CM | POA: Diagnosis not present

## 2017-06-24 DIAGNOSIS — N183 Chronic kidney disease, stage 3 (moderate): Secondary | ICD-10-CM | POA: Diagnosis not present

## 2017-06-24 DIAGNOSIS — E1165 Type 2 diabetes mellitus with hyperglycemia: Secondary | ICD-10-CM | POA: Diagnosis not present

## 2017-06-24 DIAGNOSIS — E8881 Metabolic syndrome: Secondary | ICD-10-CM | POA: Diagnosis not present

## 2017-06-24 DIAGNOSIS — I10 Essential (primary) hypertension: Secondary | ICD-10-CM | POA: Diagnosis not present

## 2017-06-24 DIAGNOSIS — E782 Mixed hyperlipidemia: Secondary | ICD-10-CM | POA: Diagnosis not present

## 2017-06-24 DIAGNOSIS — E039 Hypothyroidism, unspecified: Secondary | ICD-10-CM | POA: Diagnosis not present

## 2017-06-24 DIAGNOSIS — K210 Gastro-esophageal reflux disease with esophagitis: Secondary | ICD-10-CM | POA: Diagnosis not present

## 2017-06-24 DIAGNOSIS — Z9189 Other specified personal risk factors, not elsewhere classified: Secondary | ICD-10-CM | POA: Diagnosis not present

## 2017-06-24 DIAGNOSIS — E1122 Type 2 diabetes mellitus with diabetic chronic kidney disease: Secondary | ICD-10-CM | POA: Diagnosis not present

## 2017-06-27 DIAGNOSIS — E039 Hypothyroidism, unspecified: Secondary | ICD-10-CM | POA: Diagnosis not present

## 2017-06-27 DIAGNOSIS — Z0001 Encounter for general adult medical examination with abnormal findings: Secondary | ICD-10-CM | POA: Diagnosis not present

## 2017-06-27 DIAGNOSIS — M1711 Unilateral primary osteoarthritis, right knee: Secondary | ICD-10-CM | POA: Diagnosis not present

## 2017-06-27 DIAGNOSIS — E782 Mixed hyperlipidemia: Secondary | ICD-10-CM | POA: Diagnosis not present

## 2017-07-01 DIAGNOSIS — M79671 Pain in right foot: Secondary | ICD-10-CM | POA: Diagnosis not present

## 2017-07-01 DIAGNOSIS — S92014D Nondisplaced fracture of body of right calcaneus, subsequent encounter for fracture with routine healing: Secondary | ICD-10-CM | POA: Diagnosis not present

## 2017-07-04 ENCOUNTER — Ambulatory Visit: Payer: Self-pay | Admitting: Genetics

## 2017-07-04 ENCOUNTER — Encounter: Payer: Self-pay | Admitting: Genetics

## 2017-07-04 DIAGNOSIS — Z1379 Encounter for other screening for genetic and chromosomal anomalies: Secondary | ICD-10-CM

## 2017-07-04 DIAGNOSIS — Z809 Family history of malignant neoplasm, unspecified: Secondary | ICD-10-CM

## 2017-07-04 DIAGNOSIS — C541 Malignant neoplasm of endometrium: Secondary | ICD-10-CM

## 2017-07-04 DIAGNOSIS — Z803 Family history of malignant neoplasm of breast: Secondary | ICD-10-CM

## 2017-07-04 NOTE — Progress Notes (Signed)
HPI: Alicia [name] was previously seen in the [place] clinic on 04/29/2018 due to a personal history of MSI-High endometrial cancer and concerns regarding a hereditary predisposition to cancer. Please refer to our prior cancer genetics clinic note for more information regarding Alicia [name]'s medical, social and family histories, and our assessment and recommendations, at the time. Alicia [name] recent genetic test results were disclosed to her, as well as recommendations warranted by these results. These results and recommendations are discussed in more detail below.  CANCER HISTORY:    Endometrial ca [facility])   12/31/2016 Initial Diagnosis    Endometrial ca [facility])        FAMILY HISTORY:  We obtained a detailed, 4-generation family history.  Significant diagnoses are listed below: Family History  Problem Relation Age of Onset  . Sudden death Brother        age 67s, died while playing raquetball  . Stroke Mother   . Cancer Father        throat  . Tongue cancer Father   . Heart disease Maternal Grandmother   . Cancer Maternal Aunt        type unk,hx smoking  . Cancer Maternal Uncle        type unk, hx smok  . Cancer Paternal Aunt 76       thinks it was GI/colon- had hole in side and colostomy bag  . Cancer Maternal Aunt        type unk, had a colostomy, hx smoking  . Cancer Maternal Uncle        cancer maybe-?type unk  . Throat cancer Maternal Uncle        hx smoking  . Heart attack Maternal Uncle 50  . Cancer Cousin        type unk- died in 66's/50's  . Cancer Cousin        type unk, died in 7's/50's  . Breast cancer Cousin        x2  . Other Other 25       brain tumor  . Allergic rhinitis Neg Hx   . Angioedema Neg Hx   . Asthma Neg Hx   . Atopy Neg Hx   . Eczema Neg Hx   . Immunodeficiency Neg Hx   . Urticaria Neg Hx     Alicia [name] has a 52 year-old daughter with no history of cancer and no children.  Alicia [name] also has a 4 year-old son who has no history  of cancer.  He has a 60 year-old son and a 87 year-old daughter. Alicia [name] has a brother who died in his 79's due to sudden death.  He had 2 children and some grandchildren.    Alicia [name] father died at 28 due to throat and tongue cancer.  He had a history of smoking.  Alicia [name] reports that she has several paternal uncles and aunts, but does not know any health information about most of them.  She reports that 1 paternal aunt was diagnsoed with cancer in her 109's and thinks it was a colon/GI cancer because she had a large hole in her side and a colostomy bag.  Her paternal grandparents died due to heart problems.    Alicia [name] mother died at 44 due to a stroke.  Alicia [name] had 8 maternal aunts and uncles: -1 maternal uncle died at age 31.  -72 maternal uncle died at age 61.  -1 maternal uncle might have had cancer.  He had several children,2 of his daughters died of cancer in their 36's/50's- the type of cancer is unk.  -1 maternal uncle died of throat cancer and had a history of smoking.  -1 maternal uncle died of cancer- type and age of dx is unknown.  -1 maternal uncle died at 54 due to a heart attack.   -1 maternal aunt died of cancer- type and age of dx unknown.  This aunt had a daughter who had breast cancer twice.  She had a son who had a brain tumor at age 63.  -1 maternal aunt died of cancer and had a history of smoking.  Type and age of dx unk.  Alicia [name] has several other maternal cousins, no others with any known history of cancer.  Alicia [name] maternal grandparents did not have a history of cancer.     Alicia [name] is unaware of previous family history of genetic testing for hereditary cancer risks. Patient's maternal ancestors are of English/Scottish descent, and paternal ancestors are of English/Scottish descent. There is no reported Ashkenazi Jewish ancestry. There is no known consanguinity.  GENETIC TEST RESULTS: Genetic testing performed through [facility]  panel reported out on 06/14/2017 showed no pathogenic variants. Germline Genes Analyzed: MLH1, MSH2, MSH6, PMS2, APC, ATM, BARD1, BMPR1A, BRIP1, CDH1, CDKN2A, CHEK2, DICER1, MRE11A, MUTYH, NBN, PALB2, PTEN, RAD50, RAD51C, RAD51D, SMAD4, STK11, TP53, CDK4, NF1, BRCA1, BRCA2, POLD1, POLE, SMARCA4, HOXB13 (sequencing and deletion/duplication); EPCAM, GREM1 (deletion/duplication only).  Somatic Results:  BRAFV600E- not detected, KRAS targeted analysis- no variants detected NRAS targeted analysis- no variants detected Microsatellite Instability- High MLH1 Promoter Hypermethylation- Present Somatic Variant of uncertain significance in MSH2 p.P415S was identified.    The test report will be scanned into EPIC and will be located under the Molecular Pathology section of the Results Review tab.A portion of the result report is included below for reference.        We discussed with Alicia [name] that because current genetic testing is not perfect, it is possible there may be a gene mutation in one of these genes that current testing cannot detect, but that chance is small. We also discussed, that there could be another gene that has not yet been discovered, or that we have not yet tested, that is responsible for the cancer diagnoses in the family. It is also possible there is a hereditary cause for the cancer in the family that Alicia [name] did not inherit and therefore was not identified in her testing.  Therefore, it is important to remain in touch with cancer genetics in the future so that we can continue to offer Alicia [name] the most up to date genetic testing.   ADDITIONAL GENETIC TESTING: We discussed with Alicia [name] that there are other genes that are associated with increased cancer risk that can be analyzed. The laboratories that offer this testing look at these additional genes via a hereditary cancer gene panel. Should Alicia [name] wish to pursue additional genetic testing, we are happy to discuss and  coordinate this testing, at any time.    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    This normal result is reassuring and indicates that it is unlikely Alicia [name] has an increased risk of cancer due to a germline mutation in one of these genes.  Therefore, Alicia [name]. Shackett was advised to continue following the cancer screening guidelines provided by her oncology and primary healthcare providers. Other factors  such as her personal and family history may still affect her cancer risk.  Genetic testing cannot definitively rule out a hereditary cause for Alicia [name]. Hostetler's cancer as there could be mutations current technology cannot detect or in genes not yet identified to increase cancer risk.   RECOMMENDATIONS FOR FAMILY MEMBERS: Women in this family might be at some increased risk of developing cancer, over the general population risk, simply due to the family history of cancer. We recommended women in this family have a yearly mammogram beginning at age 63, or 23 years younger than the earliest onset of cancer, an annual clinical breast exam, and perform monthly breast self-exams. Women in this family should also have a gynecological exam as recommended by their primary provider. All family members should have a colonoscopy by age 52.  All family members should inform their physicians about the family history of cancer so their doctors can make the most appropriate screening recommendations for them.   Based on Alicia [name]. Grisanti's family history, her maternal and paternal relatives appear meet medical criteria and be recommended to have genetic testing.   Alicia [name]. Tesfaye will let us know if we can be of any assistance in coordinating genetic counseling and/or testing for these family members.   FOLLOW-UP: Lastly, we discussed with Alicia [name]. Sammarco that cancer genetics is a rapidly advancing field and it is possible that new genetic tests will be appropriate for her  and/or her family members in the future. We encouraged her to remain in contact with cancer genetics on an annual basis so we can update her personal and family histories and let her know of advances in cancer genetics that may benefit this family.   Our contact number was provided. Alicia [name]. Zelaya questions were answered to her satisfaction, and she knows she is welcome to call us at anytime with additional questions or concerns.   Ferol Luz, Alicia [name], [facility] Certified Genetic Counselor [email or web address]

## 2017-07-29 DIAGNOSIS — S92014D Nondisplaced fracture of body of right calcaneus, subsequent encounter for fracture with routine healing: Secondary | ICD-10-CM | POA: Diagnosis not present

## 2017-07-29 DIAGNOSIS — M79671 Pain in right foot: Secondary | ICD-10-CM | POA: Diagnosis not present

## 2017-08-07 ENCOUNTER — Ambulatory Visit: Payer: Medicare Other | Admitting: Gynecologic Oncology

## 2017-08-12 ENCOUNTER — Telehealth: Payer: Self-pay | Admitting: *Deleted

## 2017-08-12 ENCOUNTER — Ambulatory Visit (INDEPENDENT_AMBULATORY_CARE_PROVIDER_SITE_OTHER): Payer: Medicare Other | Admitting: *Deleted

## 2017-08-12 DIAGNOSIS — I441 Atrioventricular block, second degree: Secondary | ICD-10-CM | POA: Diagnosis not present

## 2017-08-12 NOTE — Telephone Encounter (Signed)
Called and and moved the patient's appt from April 18th to May 2nd

## 2017-08-13 NOTE — Progress Notes (Signed)
Remote pacemaker transmission.   

## 2017-08-15 ENCOUNTER — Encounter: Payer: Self-pay | Admitting: Cardiology

## 2017-08-21 DIAGNOSIS — M1711 Unilateral primary osteoarthritis, right knee: Secondary | ICD-10-CM | POA: Diagnosis not present

## 2017-08-22 ENCOUNTER — Ambulatory Visit: Payer: Medicare Other | Admitting: Gynecologic Oncology

## 2017-08-22 DIAGNOSIS — M79671 Pain in right foot: Secondary | ICD-10-CM | POA: Diagnosis not present

## 2017-09-03 DIAGNOSIS — M79671 Pain in right foot: Secondary | ICD-10-CM | POA: Diagnosis not present

## 2017-09-03 DIAGNOSIS — E1151 Type 2 diabetes mellitus with diabetic peripheral angiopathy without gangrene: Secondary | ICD-10-CM | POA: Diagnosis not present

## 2017-09-03 DIAGNOSIS — E1140 Type 2 diabetes mellitus with diabetic neuropathy, unspecified: Secondary | ICD-10-CM | POA: Diagnosis not present

## 2017-09-03 DIAGNOSIS — L600 Ingrowing nail: Secondary | ICD-10-CM | POA: Diagnosis not present

## 2017-09-04 LAB — CUP PACEART REMOTE DEVICE CHECK
Battery Remaining Longevity: 135 mo
Battery Remaining Percentage: 95.5 %
Battery Voltage: 2.99 V
Brady Statistic AP VS Percent: 48 %
Brady Statistic RV Percent Paced: 1.1 %
Implantable Lead Implant Date: 20140811
Implantable Lead Location: 753859
Implantable Pulse Generator Implant Date: 20140811
Lead Channel Impedance Value: 440 Ohm
Lead Channel Impedance Value: 440 Ohm
Lead Channel Pacing Threshold Amplitude: 0.625 V
Lead Channel Pacing Threshold Amplitude: 0.625 V
Lead Channel Pacing Threshold Pulse Width: 0.4 ms
Lead Channel Sensing Intrinsic Amplitude: 3.2 mV
Lead Channel Setting Pacing Pulse Width: 0.4 ms
Lead Channel Setting Sensing Sensitivity: 2 mV
MDC IDC LEAD IMPLANT DT: 20140811
MDC IDC LEAD LOCATION: 753860
MDC IDC MSMT LEADCHNL RV PACING THRESHOLD PULSEWIDTH: 0.4 ms
MDC IDC MSMT LEADCHNL RV SENSING INTR AMPL: 12 mV
MDC IDC PG SERIAL: 7528878
MDC IDC SESS DTM: 20190408083334
MDC IDC SET LEADCHNL RA PACING AMPLITUDE: 1.625
MDC IDC SET LEADCHNL RV PACING AMPLITUDE: 0.875
MDC IDC STAT BRADY AP VP PERCENT: 1 %
MDC IDC STAT BRADY AS VP PERCENT: 1 %
MDC IDC STAT BRADY AS VS PERCENT: 50 %
MDC IDC STAT BRADY RA PERCENT PACED: 48 %

## 2017-09-05 ENCOUNTER — Encounter: Payer: Self-pay | Admitting: Gynecologic Oncology

## 2017-09-05 ENCOUNTER — Inpatient Hospital Stay: Payer: Medicare Other | Attending: Gynecologic Oncology | Admitting: Gynecologic Oncology

## 2017-09-05 VITALS — BP 140/46 | HR 61 | Temp 98.2°F | Resp 18 | Ht 66.0 in | Wt 241.1 lb

## 2017-09-05 DIAGNOSIS — Z923 Personal history of irradiation: Secondary | ICD-10-CM | POA: Insufficient documentation

## 2017-09-05 DIAGNOSIS — Z90722 Acquired absence of ovaries, bilateral: Secondary | ICD-10-CM | POA: Diagnosis not present

## 2017-09-05 DIAGNOSIS — Z90710 Acquired absence of both cervix and uterus: Secondary | ICD-10-CM | POA: Diagnosis not present

## 2017-09-05 DIAGNOSIS — C541 Malignant neoplasm of endometrium: Secondary | ICD-10-CM | POA: Diagnosis not present

## 2017-09-05 NOTE — Patient Instructions (Signed)
Please notify Dr Maryellen Dowdle at phone number [phone] if you notice vaginal bleeding, new pelvic or abdominal pains, bloating, feeling full easy, or a change in bladder or bowel function.   Please return to see Dr Nainoa Woldt in October, 2019.  

## 2017-09-05 NOTE — Progress Notes (Signed)
Consult Note: Gyn-Onc  Alicia [name] 72 y.o. female  CC:  Chief Complaint  Patient presents with  . 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 MSI unstable (genetic testing negative). S/p vaginal brachytherapy completed December, 2018.  I recommend she follow-up at 3 monthly intervals for symptom review, physical examination and pelvic examination. Pap smear is not recommended in routine endometrial cancer surveillance. After 2 years we will space these visits to every 6 months, and then annually if recurrence has not developed within 5 years. All questions were answered.   HPI: Patient is seen today in consultation at the request of Dr. Elonda [name].  Patient is a very pleasant 72 year old gravida 2 para 2 has been menopausal since her mid-40s. She's never took any hormone replacement therapy. She had an episode of bright red bleeding which then turned to brown in July 2018. She states she was seen by her primary care provider who performed a Pap smear. She does not know what those results are. She was then referred to Dr. Elonda [name]. She underwent a pelvic ultrasound on 12/17/2016. It revealed the uterus to be 9.8 x 4.9 x 6.1 cm with an anterior subserosal fibroid measuring 4 x 3 x 2.8 cm. The endometrium was 1.59 cm. The adnexa were unremarkable. She subsequently underwent an endometrial biopsy on August 27. She had a benign endocervical polyp. 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 Is for this reason that she is referred to [place] today.  She is otherwise in her usual state of health. She states she is always cold and relates that secondary to her thyroid disease. She states last night she will cup sweating and what she describes is disoriented. She checked her sugar and her blood pressure. She states her blood pressure was okay and checked her sugar which was 56. She  then took glucose pills that she had at home and a repair. Her Accu-Cheks went from 48 then to 37 and she started feeling better. This morning her blood sugar was 96. Her last A1c was 6.3. She states that usually her sugars run from 110 220. This is a first time that she has had to take sugar pills at home. She states she's had one other episode of what she describes as hypoglycemia at a restaurant and she felt better after drinking orange juice. After she wasn't sure that was related to her atrial fibrillation she felt a little bit nauseated and occasionally with her atrial fibrillation she will. Slightly nauseated.  She denies any chest pain or shortness of breath. She had a pacemaker placed about 6 months after that began experiencing a defibrillation. She's not sure when her last A. fib episode was. She's never had a cardiac catheterization. Her cardiologist is Dr. Rayann [name] and she saw him in the summer. She does endorse some lower extremity edema. She is on hydrochlorothiazide but she last took it on Saturday she needs to get her prescription refilled. Her last mammogram was 3 years ago. She has not had once she had a pacemaker placed for fear that it would hurt. Her last colonoscopy was in 2003.  Her father's health is notable for head and neck cancer he was a heavy smoker and drink. He certainly died at the age of 74 from a tongue cancer. Her mother died of a stroke. All of her mother's siblings except one died of cancer. This includes men and women. She does not  know the types. Preop creatinine was very elevated at 2.5, however re-check was normal at 1.3.  On 10/16 she underwent robotic hysterectomy, BSO, SLN biopsy and left pelvic lymphadenectomy. Final pathology revealed a grade 2 tumor, 3.6cm with 0.2 of 2.2cm myo invasion, no LVSI and no cervical or nodal involvement. MSI unstable tumor on IHC. GENETIC TEST RESULTS: Genetic testing performed through [facility] panel  reported out on 06/14/2017 showed no pathogenic variants. Germline Genes Analyzed: MLH1, MSH2, MSH6, PMS2, APC, ATM, BARD1, BMPR1A, BRIP1, CDH1, CDKN2A, CHEK2, DICER1, MRE11A, MUTYH, NBN, PALB2, PTEN, RAD50, RAD51C, RAD51D, SMAD4, STK11, TP53, CDK4, NF1, BRCA1, BRCA2, POLD1, POLE, SMARCA4, HOXB13 (sequencing and deletion/duplication); EPCAM, GREM1 (deletion/duplication only). 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   She was determined to have high/intermediate risk factors and therefore adjuvant radiation as prescribed.   She completed adjuvant radiation between 04/04/17 - 04/25/17 receiving 30Gy in 5 fractions at 6 Gy.  She tolerated treatment well and has had no symptoms concerning for recurrence.   Review of Systems: Constitutional: Feels cold all the time, this is no change Skin: No rash Cardiovascular: No chest pain, shortness of breath, + LE edema as she has not taken her Lasix and Saturday Pulmonary: No cough or wheeze.  Gastro Intestinal:  No nausea, vomiting, constipation, or diarrhea reported. No bright red blood per rectum or change in bowel movement.  Genitourinary: No frequency, urgency, or dysuria.  + vaginal bleeding and discharge.  Musculoskeletal: + Bilateral knee pain. She states that she needs injections per mail in her right knee. She had arthroscopic knee surgery that "did not work" as she took a blood thinner and had a "hemorrhage" into her knee.  Neurologic: No weakness Psychology: No changes  Current Meds:  Outpatient Encounter Medications as of 09/05/2017  Medication Sig  . apixaban (ELIQUIS) 5 MG TABS tablet Take 1 tablet (5 mg total) by mouth 2 (two) times daily. PT NEEDS APPT, CALL [phone] TO SCHEDULE, 1ST ATTEMPT  .  citalopram (CELEXA) 20 MG tablet Take 10 mg daily by mouth.   . diltiazem (CARDIZEM) 30 MG tablet Take 30 mg by mouth See admin instructions. Pt to take as needed if going into AFIB  . hydrochlorothiazide (HYDRODIURIL) 25 MG tablet Take 25 mg by mouth daily.   . insulin NPH-regular Human (NOVOLIN 70/30) (70-30) 100 UNIT/ML injection Inject 48 Units into the skin 2 (two) times daily with a meal. Pt takes 48 units every morning, and 48 units every evening  . INSULIN SYRINGE 1CC/29G 29G X 1/2" 1 ML MISC   . levothyroxine (SYNTHROID) 112 MCG tablet Take 1 tablet (112 mcg total) by mouth daily before breakfast.  . losartan (COZAAR) 100 MG tablet Take 50 mg daily by mouth.   . Misc Natural Products (ESSIAC TONIC) CAPS Take 2 capsules by mouth 2 (two) times daily.  . Multiple Vitamins-Minerals (ICAPS) CAPS Take 1 capsule by mouth daily.   [name] (SYSTANE) 0.4-0.3 % SOLN Place 1 drop into both eyes daily as needed (dry eyes).   . Pramoxine-Menthol (ANTI-ITCH MEDICATION EX) Apply 1 application topically 2 (two) times daily as needed (Itching, hives). DerMend Steroid Free  . Probiotic Product (PROBIOTIC DAILY PO) Take 1 tablet by mouth daily.  . flecainide (TAMBOCOR) 100 MG tablet Take 3 tablets (300 mg total)  by mouth once. AS NEEDED (Patient taking differently: Take 300 mg by mouth See admin instructions. AS NEEDED if going into AFIB)   No facility-administered encounter medications on file as of 09/05/2017.     Allergy:  Allergies  Allergen Reactions  . Ace Inhibitors Cough  . Cardizem [Diltiazem Hcl] Other (See Comments)    headache  . Oxycodone Nausea Only  . Statins Other (See Comments)    myalgias  . Sulfa Antibiotics Hives and Swelling  . Tape     Paper tape is okay  . Cortisone Rash    flushing  . Latex Rash    Sensitive not allergic  . Tartrazine Rash    Social Hx:   Social History   Socioeconomic History  . Marital status: Married    Spouse name: Not  on file  . Number of children: 2  . Years of education: Not on file  . Highest education level: Not on file  Occupational History  . Occupation: retired  Scientific laboratory technician  . Financial resource strain: Not on file  . Food insecurity:    Worry: Not on file    Inability: Not on file  . Transportation needs:    Medical: Not on file    Non-medical: Not on file  Tobacco Use  . Smoking status: Never Smoker  . Smokeless tobacco: Never Used  Substance and Sexual Activity  . Alcohol use: No    Alcohol/week: 0.0 oz  . Drug use: No  . Sexual activity: Not Currently    Birth control/protection: Post-menopausal  Lifestyle  . Physical activity:    Days per week: Not on file    Minutes per session: Not on file  . Stress: Not on file  Relationships  . Social connections:    Talks on phone: Not on file    Gets together: Not on file    Attends religious service: Not on file    Active member of club or organization: Not on file    Attends meetings of clubs or organizations: Not on file    Relationship status: Not on file  . Intimate partner violence:    Fear of current or ex partner: Not on file    Emotionally abused: Not on file    Physically abused: Not on file    Forced sexual activity: Not on file  Other Topics Concern  . Not on file  Social History Narrative   Retired and lives in [place] with spouse    Past Surgical Hx:  Past Surgical History:  Procedure Laterality Date  . ANKLE SURGERY Right    multiple  . BREAST SURGERY     biopsy L breast benign  . CHOLECYSTECTOMY    . COLONOSCOPY    . EYE SURGERY     both cataracts  . FOOT ARTHROTOMY     left  . FOOT SURGERY     Bil  bone spurs /Left  foot neuroma removed Right foot sinus tarside surgery Right bone in toe removed  . KNEE SURGERY     right  . 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 PERMANENT PACEMAKER INSERTION N/A 12/15/2012   SJM  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 by Dr Lovena Le for transient AV block  . ROBOTIC ASSISTED TOTAL HYSTERECTOMY WITH BILATERAL SALPINGO OOPHERECTOMY N/A 02/19/2017   Procedure: XI ROBOTIC ASSISTED TOTAL HYSTERECTOMY WITH BILATERAL SALPINGO OOPHORECTOMY;  Surgeon: Everitt Amber, MD;  Location:  [facility];  Service: Gynecology;  Laterality: N/A;  . SENTINEL NODE BIOPSY N/A 02/19/2017   Procedure: SENTINEL NODE BIOPSY;  Surgeon: Everitt Amber, MD;  Location: [facility];  Service: Gynecology;  Laterality: N/A;  . SHOULDER ARTHROSCOPY WITH SUBACROMIAL DECOMPRESSION AND OPEN ROTATOR C Right 07/07/2013   Procedure: RIGHT SHOULDER ARTHROSCOPY WITH SUBACROMIAL DECOMPRESSION AND ROTATOR CUFF DEBRIDEMENT/ DISTAL CLAVICLE RESECTION;  Surgeon: [name], MD;  Location: [place];  Service: Orthopedics;  Laterality: Right;  . SHOULDER SURGERY Right 2012    Past Medical Hx:  Past Medical History:  Diagnosis Date  . Anemia    hx of  . Anxiety   . Cancer [facility])    endometrial cancer/ skin cancers removed nose non melanoma  . Diabetes ([place])    Type 2  . DJD (degenerative joint disease)   . Dysrhythmia    SYMPTOMATIC BRADYCARDIA  / Afib  . Family history of breast cancer   . Family history of cancer   . Gallstones   . History of kidney stones    Hx of   . History of radiation therapy 04/04/17-04/25/17   vaginal cuff 30 Gy in 5 fractions  . Hypertension    HCTZ  . Hypothyroidism   . Obesity   . 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 Sleep apnea    No machine Cant tolerate  . Wears glasses     Oncology Hx:    Endometrial ca [facility])   12/31/2016 Initial Diagnosis    Endometrial ca [facility])       Family Hx:  Family History  Problem Relation Age of Onset  . Sudden death Brother        age 45s, died while playing raquetball  . Stroke Mother   . Cancer Father        throat  . Tongue cancer Father   . Heart disease Maternal Grandmother   . Cancer Maternal Aunt        type unk,hx  smoking  . Cancer Maternal Uncle        type unk, hx smok  . Cancer Paternal Aunt 78       thinks it was GI/colon- had hole in side and colostomy bag  . Cancer Maternal Aunt        type unk, had a colostomy, hx smoking  . Cancer Maternal Uncle        cancer maybe-?type unk  . Throat cancer Maternal Uncle        hx smoking  . Heart attack Maternal Uncle 50  . Cancer Cousin        type unk- died in 65's/50's  . Cancer Cousin        type unk, died in 28's/50's  . Breast cancer Cousin        x2  . Other Other 25       brain tumor  . Allergic rhinitis Neg Hx   . Angioedema Neg Hx   . Asthma Neg Hx   . Atopy Neg Hx   . Eczema Neg Hx   . Immunodeficiency Neg Hx   . Urticaria Neg Hx     Vitals:  Blood pressure (!) 140/46, pulse 61, temperature 98.2 F (36.8 C), temperature source Oral, resp. rate 18, height _0  (1.676 m), weight 241 lb 1.6 oz (109.4 kg), SpO2 97 %.  Physical Exam: Well-nourished older white female in no acute distress.  Neck: Supple, no lymphadenopathy,  no thyromegaly.  Lungs: Clear to auscultation bilaterally.  Cardiac: Regular rate and rhythm. No murmur.  Abdomen: Obese well-healed laparoscopic incisions. Abdomen is soft, nontender, nondistended. There are no palpable masses or hepatomegaly. Exam is somewhat limited by habitus. Incisions well healed and soft  Groins: No lymphadenopathy.  Extremities: 2+ pitting edema equal bilaterally in the lower extremities. No calf tenderness.  Pelvic: External genitalia is within normal limits. Vagina is well healed at the cuff. No lesions. No masses. No lesions.     Thereasa Solo, MD 09/05/2017, 2:08 PM

## 2017-09-25 DIAGNOSIS — J0100 Acute maxillary sinusitis, unspecified: Secondary | ICD-10-CM | POA: Diagnosis not present

## 2017-09-25 DIAGNOSIS — J309 Allergic rhinitis, unspecified: Secondary | ICD-10-CM | POA: Diagnosis not present

## 2017-10-28 ENCOUNTER — Ambulatory Visit: Payer: Self-pay | Admitting: Radiation Oncology

## 2017-11-04 ENCOUNTER — Other Ambulatory Visit: Payer: Self-pay

## 2017-11-04 ENCOUNTER — Ambulatory Visit
Admission: RE | Admit: 2017-11-04 | Discharge: 2017-11-04 | Disposition: A | Discharge status: Home / Self Care | Payer: Medicare Other | Source: Ambulatory Visit | Attending: Radiation Oncology | Admitting: Radiation Oncology

## 2017-11-04 ENCOUNTER — Encounter: Payer: Self-pay | Admitting: Radiation Oncology

## 2017-11-04 VITALS — BP 149/52 | HR 74 | Temp 98.0°F | Resp 16 | Wt 236.8 lb

## 2017-11-04 DIAGNOSIS — Z08 Encounter for follow-up examination after completed treatment for malignant neoplasm: Secondary | ICD-10-CM | POA: Diagnosis not present

## 2017-11-04 DIAGNOSIS — Z79890 Hormone replacement therapy (postmenopausal): Secondary | ICD-10-CM | POA: Insufficient documentation

## 2017-11-04 DIAGNOSIS — K5900 Constipation, unspecified: Secondary | ICD-10-CM | POA: Insufficient documentation

## 2017-11-04 DIAGNOSIS — R197 Diarrhea, unspecified: Secondary | ICD-10-CM | POA: Diagnosis not present

## 2017-11-04 DIAGNOSIS — Z882 Allergy status to sulfonamides status: Secondary | ICD-10-CM | POA: Insufficient documentation

## 2017-11-04 DIAGNOSIS — Z923 Personal history of irradiation: Secondary | ICD-10-CM | POA: Insufficient documentation

## 2017-11-04 DIAGNOSIS — Z79899 Other long term (current) drug therapy: Secondary | ICD-10-CM | POA: Insufficient documentation

## 2017-11-04 DIAGNOSIS — Z7901 Long term (current) use of anticoagulants: Secondary | ICD-10-CM | POA: Insufficient documentation

## 2017-11-04 DIAGNOSIS — M25562 Pain in left knee: Secondary | ICD-10-CM | POA: Insufficient documentation

## 2017-11-04 DIAGNOSIS — Z794 Long term (current) use of insulin: Secondary | ICD-10-CM | POA: Diagnosis not present

## 2017-11-04 DIAGNOSIS — Z888 Allergy status to other drugs, medicaments and biological substances status: Secondary | ICD-10-CM | POA: Diagnosis not present

## 2017-11-04 DIAGNOSIS — C541 Malignant neoplasm of endometrium: Secondary | ICD-10-CM | POA: Diagnosis not present

## 2017-11-04 DIAGNOSIS — Z885 Allergy status to narcotic agent status: Secondary | ICD-10-CM | POA: Diagnosis not present

## 2017-11-04 DIAGNOSIS — R5383 Other fatigue: Secondary | ICD-10-CM | POA: Diagnosis not present

## 2017-11-04 DIAGNOSIS — R55 Syncope and collapse: Secondary | ICD-10-CM

## 2017-11-04 HISTORY — DX: Syncope and collapse: R55

## 2017-11-04 NOTE — Progress Notes (Signed)
Radiation Oncology         [phone] ________________________________  Name: Alicia [name] MRN: [id number]  Date: 11/04/2017  DOB: 24-Dec-1945  Follow-Up Visit Note  CC: Caryl Bis, MD  Everitt Amber, MD    ICD-10-CM   1. Endometrial cancer [facility]) C54.1     Diagnosis:   72 y.o. female with Stage I-A grade2endometrioid endometrialcancer   Interval Since Last Radiation:  7 months  Radiation treatment dates:   04/04/2017, 04/11/2017, 04/18/2017, 04/22/2017, 04/25/2017 Site/dose:   The vagina cuff was treated to 30 Gy in 5 fractions of 6 Gy.  Narrative:  The patient returns today for routine follow-up. She is doing well overall. She follows up with Dr. Kern Alberta q 3 months with routine labs.   On review of systems, she reports moderate fatigue, diarrhea, left knee pain (being treated with injections), minimal weight loss, and decreased appetite. She takes pill form of imodium to alleviate her diarrhea, however she then has constipation for up to 4 days following use. she denies vaginal bleeding, back pain, flank pain, and any other symptoms. Pertinent positives are listed and detailed within the above HPI. She denies any new medical issues since her last visit.              ALLERGIES:  is allergic to ace inhibitors; cardizem [diltiazem hcl]; oxycodone; statins; sulfa antibiotics; tape; cortisone; latex; and tartrazine.  Meds: Current Outpatient Medications  Medication Sig Dispense Refill  . apixaban (ELIQUIS) 5 MG TABS tablet Take 1 tablet (5 mg total) by mouth 2 (two) times daily. PT NEEDS APPT, CALL [phone] TO SCHEDULE, 1ST ATTEMPT 60 tablet 0  . citalopram (CELEXA) 20 MG tablet Take 10 mg daily by mouth.     . diltiazem (CARDIZEM) 30 MG tablet Take 30 mg by mouth See admin instructions. Pt to take as needed if going into AFIB    . hydrochlorothiazide (HYDRODIURIL) 25 MG tablet Take 25 mg by mouth daily.     . insulin NPH-regular Human (NOVOLIN 70/30) (70-30) 100  UNIT/ML injection Inject 48 Units into the skin 2 (two) times daily with a meal. Pt takes 48 units every morning, and 48 units every evening    . INSULIN SYRINGE 1CC/29G 29G X 1/2" 1 ML MISC     . levothyroxine (SYNTHROID) 112 MCG tablet Take 1 tablet (112 mcg total) by mouth daily before breakfast. 30 tablet 3  . losartan (COZAAR) 100 MG tablet Take 50 mg daily by mouth.     . Misc Natural Products (ESSIAC TONIC) CAPS Take 2 capsules by mouth 2 (two) times daily.    . Multiple Vitamins-Minerals (ICAPS) CAPS Take 1 capsule by mouth daily.     [name] (SYSTANE) 0.4-0.3 % SOLN Place 1 drop into both eyes daily as needed (dry eyes).     . Pramoxine-Menthol (ANTI-ITCH MEDICATION EX) Apply 1 application topically 2 (two) times daily as needed (Itching, hives). DerMend Steroid Free    . Probiotic Product (PROBIOTIC DAILY PO) Take 1 tablet by mouth daily.    . flecainide (TAMBOCOR) 100 MG tablet Take 3 tablets (300 mg total) by mouth once. AS NEEDED (Patient taking differently: Take 300 mg by mouth See admin instructions. AS NEEDED if going into AFIB) 30 tablet 3   No current facility-administered medications for this encounter.     Physical Findings: The patient is in no acute distress. Patient is alert and oriented.  weight is 236 lb 12.8 oz (107.4 kg). Her  temperature is 98 F (36.7 C). Her blood pressure is 149/52 (abnormal) and her pulse is 74. Her respiration is 16 and oxygen saturation is 97%.   Lungs are clear to auscultation bilaterally. Heart has regular rate and rhythm. No palpable cervical, supraclavicular, or axillary adenopathy. Abdomen soft, non-tender, normal bowel sounds.  On pelvic examination the external genitalia were unremarkable. A speculum exam was performed. There are no mucosal lesions noted in the vaginal vault. On bimanual and rectovaginal examination there were no pelvic masses appreciated.   Lab Findings: Lab Results  Component Value Date   WBC  7.8 02/20/2017   HGB 11.5 (L) 02/20/2017   HCT 35.4 (L) 02/20/2017   MCV 94.1 02/20/2017   PLT 234 02/20/2017    Radiographic Findings: No results found.  Impression:  No evidence of recurrence on clinical exam.  persistent fatigue and mild diarrhea since completion of brachytherapy treatments which is somewhat surprising given the small amount tissue treaed with radiation treatment.  Plan:  Follow up with Dr. Denman George in 3 months. Follow up with radiation oncology in 6 months.   ____________________________________  Blair Promise, PhD, MD  This document serves as a record of services personally performed by Gery Pray, MD. It was created on his behalf by [facility], a trained medical scribe. The creation of this record is based on the scribe's personal observations and the provider's statements to them. This document has been checked and approved by the attending provider.

## 2017-11-04 NOTE — Progress Notes (Signed)
Alicia [name] presents today for follow up with Dr. Sondra Come. She is unaccompanied. Pt denies. Pt reports fatigue as severe and describes it as not having any stamina. Pt reports taking naps every day. Pt denies hematuria or dysuria. Pt denies vaginal bleeding. Pt reports occasional vaginal discharge, yellow to brown with odor. Pt reports diarrhea frequently. Pt states Immodium constipates her. Pt denies rectal bleeding. Pt denies N/V or bloating.   BP (!) 149/52   Pulse 74   Temp 98 F (36.7 C)   Resp 16   Wt 236 lb 12.8 oz (107.4 kg)   SpO2 97%   BMI 38.22 kg/m   Wt Readings from Last 3 Encounters:  11/04/17 236 lb 12.8 oz (107.4 kg)  09/05/17 241 lb 1.6 oz (109.4 kg)  05/27/17 244 lb (110.7 kg)

## 2017-11-08 ENCOUNTER — Other Ambulatory Visit: Payer: Self-pay

## 2017-11-08 ENCOUNTER — Encounter: Payer: Self-pay | Admitting: Internal Medicine

## 2017-11-08 ENCOUNTER — Ambulatory Visit: Payer: Medicare Other | Admitting: Internal Medicine

## 2017-11-08 VITALS — BP 130/70 | HR 62 | Ht 66.0 in | Wt 234.0 lb

## 2017-11-08 DIAGNOSIS — G4733 Obstructive sleep apnea (adult) (pediatric): Secondary | ICD-10-CM

## 2017-11-08 DIAGNOSIS — I441 Atrioventricular block, second degree: Secondary | ICD-10-CM | POA: Diagnosis not present

## 2017-11-08 DIAGNOSIS — I10 Essential (primary) hypertension: Secondary | ICD-10-CM

## 2017-11-08 LAB — CUP PACEART INCLINIC DEVICE CHECK
Battery Remaining Longevity: 139 mo
Battery Voltage: 2.99 V
Brady Statistic RA Percent Paced: 48 %
Brady Statistic RV Percent Paced: 1.1 %
Date Time Interrogation Session: 20190705132841
Implantable Lead Implant Date: 20140811
Implantable Lead Location: 753860
Lead Channel Impedance Value: 400 Ohm
Lead Channel Pacing Threshold Amplitude: 0.75 V
Lead Channel Pacing Threshold Pulse Width: 0.4 ms
Lead Channel Pacing Threshold Pulse Width: 0.4 ms
Lead Channel Pacing Threshold Pulse Width: 0.4 ms
Lead Channel Sensing Intrinsic Amplitude: 12 mV
Lead Channel Setting Pacing Amplitude: 0.875
Lead Channel Setting Pacing Amplitude: 1.625
Lead Channel Setting Pacing Pulse Width: 0.4 ms
Lead Channel Setting Sensing Sensitivity: 2 mV
MDC IDC LEAD IMPLANT DT: 20140811
MDC IDC LEAD LOCATION: 753859
MDC IDC MSMT LEADCHNL RA IMPEDANCE VALUE: 425 Ohm
MDC IDC MSMT LEADCHNL RA PACING THRESHOLD AMPLITUDE: 0.75 V
MDC IDC MSMT LEADCHNL RA PACING THRESHOLD AMPLITUDE: 0.75 V
MDC IDC MSMT LEADCHNL RA SENSING INTR AMPL: 3.6 mV
MDC IDC MSMT LEADCHNL RV PACING THRESHOLD AMPLITUDE: 0.75 V
MDC IDC MSMT LEADCHNL RV PACING THRESHOLD PULSEWIDTH: 0.4 ms
MDC IDC PG IMPLANT DT: 20140811
Pulse Gen Model: 2240
Pulse Gen Serial Number: 7528878

## 2017-11-08 NOTE — Progress Notes (Signed)
PCP: Caryl Bis, MD   Primary EP:  Dr Aurea Graff Alicia [name] is a 72 y.o. female who presents today for routine electrophysiology followup.  Since last being seen in our clinic, the patient reports doing reasonably well.  She is recovering from endometrial cancer treatment.  She is very fatigued.  She is tired during the day.  Today, she denies symptoms of palpitations, chest pain, shortness of breath,  lower extremity edema, dizziness, presyncope, or syncope.  The patient is otherwise without complaint today.   Past Medical History:  Diagnosis Date  . Anemia    hx of  . Anxiety   . Cancer [facility])    endometrial cancer/ skin cancers removed nose non melanoma  . Diabetes ([place])    Type 2  . DJD (degenerative joint disease)   . Dysrhythmia    SYMPTOMATIC BRADYCARDIA  / Afib  . Family history of breast cancer   . Family history of cancer   . Gallstones   . History of kidney stones    Hx of   . History of radiation therapy 04/04/17-04/25/17   vaginal cuff 30 Gy in 5 fractions  . Hypertension    HCTZ  . Hypothyroidism   . Obesity   . 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 Sleep apnea    No machine Cant tolerate  . Wears glasses    Past Surgical History:  Procedure Laterality Date  . ANKLE SURGERY Right    multiple  . BREAST SURGERY     biopsy L breast benign  . CHOLECYSTECTOMY    . COLONOSCOPY    . EYE SURGERY     both cataracts  . FOOT ARTHROTOMY     left  . FOOT SURGERY     Bil  bone spurs /Left  foot neuroma removed Right foot sinus tarside surgery Right bone in toe removed  . KNEE SURGERY     right  . 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 PERMANENT PACEMAKER INSERTION N/A 12/15/2012   SJM 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 by Dr Lovena Le for transient AV block  . ROBOTIC ASSISTED TOTAL HYSTERECTOMY WITH BILATERAL SALPINGO OOPHERECTOMY N/A 02/19/2017   Procedure: XI ROBOTIC  ASSISTED TOTAL HYSTERECTOMY WITH BILATERAL SALPINGO OOPHORECTOMY;  Surgeon: Everitt Amber, MD;  Location: [facility];  Service: Gynecology;  Laterality: N/A;  . SENTINEL NODE BIOPSY N/A 02/19/2017   Procedure: SENTINEL NODE BIOPSY;  Surgeon: Everitt Amber, MD;  Location: [facility];  Service: Gynecology;  Laterality: N/A;  . SHOULDER ARTHROSCOPY WITH SUBACROMIAL DECOMPRESSION AND OPEN ROTATOR C Right 07/07/2013   Procedure: RIGHT SHOULDER ARTHROSCOPY WITH SUBACROMIAL DECOMPRESSION AND ROTATOR CUFF DEBRIDEMENT/ DISTAL CLAVICLE RESECTION;  Surgeon: [name], MD;  Location: [facility];  Service: Orthopedics;  Laterality: Right;  . SHOULDER SURGERY Right 2012    ROS- all systems are reviewed and negative except as per HPI above  Current Outpatient Medications  Medication Sig Dispense Refill  . apixaban (ELIQUIS) 5 MG TABS tablet Take 1 tablet (5 mg total) by mouth 2 (two) times daily. PT NEEDS APPT, CALL [phone] TO SCHEDULE, 1ST ATTEMPT 60 tablet 0  . citalopram (CELEXA) 20 MG tablet Take 10 mg daily by mouth.     . diltiazem (CARDIZEM) 30 MG tablet Take 30 mg by mouth See admin instructions. Pt to take as needed if going  into AFIB    . flecainide (TAMBOCOR) 100 MG tablet Take 300 mg by mouth as needed. TAKE 3 TABLETS (300 MG) ONCE AS NEEDED    . hydrochlorothiazide (HYDRODIURIL) 25 MG tablet Take 25 mg by mouth daily.     . insulin NPH-regular Human (NOVOLIN 70/30) (70-30) 100 UNIT/ML injection Inject 48 Units into the skin 2 (two) times daily with a meal. Pt takes 48 units every morning, and 48 units every evening    . INSULIN SYRINGE 1CC/29G 29G X 1/2" 1 ML MISC     . levothyroxine (SYNTHROID) 112 MCG tablet Take 1 tablet (112 mcg total) by mouth daily before breakfast. 30 tablet 3  . losartan (COZAAR) 100 MG tablet Take 50 mg daily by mouth.     . Misc Natural Products (ESSIAC TONIC) CAPS Take 2 capsules by mouth 2 (two) times daily.    . Multiple Vitamins-Minerals (ICAPS) CAPS  Take 1 capsule by mouth daily.     [name] (SYSTANE) 0.4-0.3 % SOLN Place 1 drop into both eyes daily as needed (dry eyes).     . Probiotic Product (PROBIOTIC DAILY PO) Take 1 tablet by mouth daily.     No current facility-administered medications for this visit.     Physical Exam: Vitals:   11/08/17 1146  BP: 130/70  Pulse: 62  SpO2: 100%  Weight: 234 lb (106.1 kg)  Height: 5\' 6"  (1.676 m)    GEN- The patient is well appearing, alert and oriented x 3 today.   Head- normocephalic, atraumatic Eyes-  Sclera clear, conjunctiva pink Ears- hearing intact Oropharynx- clear Lungs- Clear to ausculation bilaterally, normal work of breathing Chest- pacemaker pocket is well healed Heart- Regular rate and rhythm, no murmurs, rubs or gallops, PMI not laterally displaced GI- soft, NT, ND, + BS Extremities- no clubbing, cyanosis, or edema  Pacemaker interrogation- reviewed in detail today,  See PACEART report   Assessment and Plan:  1. Symptomatic second degree heart block Normal pacemaker function See Pace Art report No changes today  2. Paroxysmal atrial fibrillation Well controlled afib burden is <1 % chads2vasc score is 4.  Continue eliquis She has prn diltiazem/ flecainide  3. HTN Stable No change required today  4. Overweight Body mass index is 37.77 kg/m. Wt Readings from Last 3 Encounters:  11/08/17 234 lb (106.1 kg)  11/04/17 236 lb 12.8 oz (107.4 kg)  09/05/17 241 lb 1.6 oz (109.4 kg)   5. OSA Does not use CPAP We discussed other options such as ental appliance.  She is not interested at this time  [place] Return in a year  Thompson Grayer MD, [facility] 11/08/2017 11:54 AM

## 2017-11-08 NOTE — Patient Instructions (Signed)
Medication Instructions:  Continue all current medications.  Labwork: none  Testing/Procedures: none  Follow-Up: Your physician wants you to follow up in:  1 year.  You will receive a reminder letter in the mail one-two months in advance.  If you don't receive a letter, please call our office to schedule the follow up appointment   Any Other Special Instructions Will Be Listed Below (If Applicable). Remote monitoring is used to monitor your Pacemaker of ICD from home. This monitoring reduces the number of office visits required to check your device to one time per year. It allows us to keep an eye on the functioning of your device to ensure it is working properly. You are scheduled for a device check from home on 11/11/2017. You may send your transmission at any time that day. If you have a wireless device, the transmission will be sent automatically. After your physician reviews your transmission, you will receive a postcard with your next transmission date.  If you need a refill on your cardiac medications before your next appointment, please call your pharmacy.  

## 2017-11-10 ENCOUNTER — Emergency Department (HOSPITAL_COMMUNITY)
Admission: EM | Admit: 2017-11-10 | Discharge: 2017-11-10 | Disposition: A | Discharge status: Home / Self Care | Payer: Medicare Other | Attending: Emergency Medicine | Admitting: Emergency Medicine

## 2017-11-10 ENCOUNTER — Emergency Department (HOSPITAL_COMMUNITY): Payer: Medicare Other

## 2017-11-10 ENCOUNTER — Other Ambulatory Visit: Payer: Self-pay

## 2017-11-10 ENCOUNTER — Encounter (HOSPITAL_COMMUNITY): Payer: Self-pay | Admitting: Emergency Medicine

## 2017-11-10 DIAGNOSIS — I10 Essential (primary) hypertension: Secondary | ICD-10-CM | POA: Insufficient documentation

## 2017-11-10 DIAGNOSIS — Z7901 Long term (current) use of anticoagulants: Secondary | ICD-10-CM | POA: Insufficient documentation

## 2017-11-10 DIAGNOSIS — E119 Type 2 diabetes mellitus without complications: Secondary | ICD-10-CM | POA: Diagnosis not present

## 2017-11-10 DIAGNOSIS — Z91040 Latex allergy status: Secondary | ICD-10-CM | POA: Diagnosis not present

## 2017-11-10 DIAGNOSIS — E039 Hypothyroidism, unspecified: Secondary | ICD-10-CM | POA: Diagnosis not present

## 2017-11-10 DIAGNOSIS — Z794 Long term (current) use of insulin: Secondary | ICD-10-CM | POA: Diagnosis not present

## 2017-11-10 DIAGNOSIS — R404 Transient alteration of awareness: Secondary | ICD-10-CM | POA: Diagnosis not present

## 2017-11-10 DIAGNOSIS — W19XXXA Unspecified fall, initial encounter: Secondary | ICD-10-CM | POA: Diagnosis not present

## 2017-11-10 DIAGNOSIS — R55 Syncope and collapse: Secondary | ICD-10-CM | POA: Diagnosis not present

## 2017-11-10 DIAGNOSIS — R52 Pain, unspecified: Secondary | ICD-10-CM | POA: Diagnosis not present

## 2017-11-10 DIAGNOSIS — R531 Weakness: Secondary | ICD-10-CM | POA: Diagnosis not present

## 2017-11-10 DIAGNOSIS — Z950 Presence of cardiac pacemaker: Secondary | ICD-10-CM | POA: Diagnosis not present

## 2017-11-10 DIAGNOSIS — Z79899 Other long term (current) drug therapy: Secondary | ICD-10-CM | POA: Insufficient documentation

## 2017-11-10 DIAGNOSIS — M25551 Pain in right hip: Secondary | ICD-10-CM | POA: Diagnosis not present

## 2017-11-10 LAB — BASIC METABOLIC PANEL
Anion gap: 9 (ref 5–15)
BUN: 22 mg/dL (ref 8–23)
CALCIUM: 9.2 mg/dL (ref 8.9–10.3)
CHLORIDE: 103 mmol/L (ref 98–111)
CO2: 27 mmol/L (ref 22–32)
CREATININE: 1.23 mg/dL — AB (ref 0.44–1.00)
GFR calc Af Amer: 49 mL/min — ABNORMAL LOW (ref >60)
GFR calc non Af Amer: 42 mL/min — ABNORMAL LOW (ref >60)
GLUCOSE: 185 mg/dL — AB (ref 70–99)
Potassium: 4 mmol/L (ref 3.5–5.1)
Sodium: 139 mmol/L (ref 135–145)

## 2017-11-10 LAB — CBC WITH DIFFERENTIAL/PLATELET
BASOS PCT: 0 %
Basophils Absolute: 0 10*3/uL (ref 0.0–0.1)
EOS ABS: 0.2 10*3/uL (ref 0.0–0.7)
Eosinophils Relative: 3 %
HEMATOCRIT: 36.5 % (ref 36.0–46.0)
HEMOGLOBIN: 12 g/dL (ref 12.0–15.0)
LYMPHS ABS: 1.6 10*3/uL (ref 0.7–4.0)
Lymphocytes Relative: 24 %
MCH: 31.5 pg (ref 26.0–34.0)
MCHC: 32.9 g/dL (ref 30.0–36.0)
MCV: 95.8 fL (ref 78.0–100.0)
MONO ABS: 0.5 10*3/uL (ref 0.1–1.0)
MONOS PCT: 7 %
NEUTROS ABS: 4.4 10*3/uL (ref 1.7–7.7)
NEUTROS PCT: 66 %
Platelets: 242 10*3/uL (ref 150–400)
RBC: 3.81 MIL/uL — ABNORMAL LOW (ref 3.87–5.11)
RDW: 13.2 % (ref 11.5–15.5)
WBC: 6.7 10*3/uL (ref 4.0–10.5)

## 2017-11-10 LAB — CBG MONITORING, ED: Glucose-Capillary: 202 mg/dL — ABNORMAL HIGH (ref 70–99)

## 2017-11-10 LAB — TROPONIN I

## 2017-11-10 MED ORDER — SODIUM CHLORIDE 0.9 % IV BOLUS
500.0000 mL | Freq: Once | INTRAVENOUS | Status: AC
  Administered 2017-11-10: 500 mL via INTRAVENOUS

## 2017-11-10 MED ORDER — SODIUM CHLORIDE 0.9 % IV SOLN
INTRAVENOUS | Status: DC
  Administered 2017-11-10: 11:00:00 via INTRAVENOUS

## 2017-11-10 NOTE — ED Notes (Signed)
EDP at bedside  

## 2017-11-10 NOTE — ED Notes (Signed)
Hydrologist. Jude's pacemaker.

## 2017-11-10 NOTE — ED Triage Notes (Signed)
Pt reports she was sitting on a stool at church and became nauseated, diaphoretic, and became dizzy and subsequently had a syncopal episode.  Was lowered to the ground by people with her.

## 2017-11-10 NOTE — Discharge Instructions (Addendum)
Follow-up with your cardiologist this week, return to the emergency room as needed for any recurrent symptoms

## 2017-11-10 NOTE — ED Provider Notes (Signed)
[facility] EMERGENCY DEPARTMENT Provider Note   CSN: [id number] Arrival date & time: 11/10/17  1013     History   Chief Complaint Chief Complaint  Patient presents with  . Loss of Consciousness    HPI Alicia [name] is a 72 y.o. female.  HPI Patient presents to the emergency room for evaluation after a syncopal episode.  Patient has a history of atrial fibrillation and symptomatic bradycardia.  She has a pacemaker in place.  Patient states she felt fine this morning when she woke up.  She was sitting at church when she suddenly became nauseated, diaphoretic and lightheaded.  She then had a brief syncopal episode.  She was not injured because she was lowered to the ground by people with her.  Symptoms resolved after a few minutes.  Patient now feels fine and back at her baseline.  She denies any trouble with any chest pain or shortness of breath.  No abdominal pain.  No vomiting or diarrhea.  No headache.  No trouble with her speech coordination or balance. Past Medical History:  Diagnosis Date  . Anemia    hx of  . Anxiety   . Cancer [facility])    endometrial cancer/ skin cancers removed nose non melanoma  . Diabetes ([place])    Type 2  . DJD (degenerative joint disease)   . Dysrhythmia    SYMPTOMATIC BRADYCARDIA  / Afib  . Family history of breast cancer   . Family history of cancer   . Gallstones   . History of kidney stones    Hx of   . History of radiation therapy 04/04/17-04/25/17   vaginal cuff 30 Gy in 5 fractions  . Hypertension    HCTZ  . Hypothyroidism   . Obesity   . 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 Sleep apnea    No machine Cant tolerate  . Wears glasses     Patient Active Problem List   Diagnosis Date Noted  . Genetic testing 07/04/2017  . Family history of cancer   . Family history of breast cancer   . Endometrial cancer ([place]) 02/19/2017  . Endometrial ca ([place]) 01/16/2017  . Cervical polyp 12/12/2016  . PMB (postmenopausal bleeding)  12/12/2016  . Dermatitis 09/19/2015  . 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 Hypothyroidism 10/05/2014  . Morbid obesity ([place]) 04/17/2014  . Type 2 diabetes mellitus without complication ([place]) 19/62/2297  . Disorders of bursae and tendons in shoulder region, unspecified 07/07/2013  . A-fib ([place]) 07/03/2013  . Second degree Mobitz II AV block 03/18/2013  . Syncope 03/18/2013  . Essential hypertension 03/18/2013  . Pacemaker 12/05/2012    Past Surgical History:  Procedure Laterality Date  . ANKLE SURGERY Right    multiple  . BREAST SURGERY     biopsy L breast benign  . CHOLECYSTECTOMY    . COLONOSCOPY    . EYE SURGERY     both cataracts  . FOOT ARTHROTOMY     left  . FOOT SURGERY     Bil  bone spurs /Left  foot neuroma removed Right foot sinus tarside surgery Right bone in toe removed  . KNEE SURGERY     right  . 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 PERMANENT PACEMAKER INSERTION N/A 12/15/2012   SJM 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 by Dr Lovena Le for transient AV block  . ROBOTIC  ASSISTED TOTAL HYSTERECTOMY WITH BILATERAL SALPINGO OOPHERECTOMY N/A 02/19/2017   Procedure: XI ROBOTIC ASSISTED TOTAL HYSTERECTOMY WITH BILATERAL SALPINGO OOPHORECTOMY;  Surgeon: Everitt Amber, MD;  Location: [facility];  Service: Gynecology;  Laterality: N/A;  . SENTINEL NODE BIOPSY N/A 02/19/2017   Procedure: SENTINEL NODE BIOPSY;  Surgeon: Everitt Amber, MD;  Location: [facility];  Service: Gynecology;  Laterality: N/A;  . SHOULDER ARTHROSCOPY WITH SUBACROMIAL DECOMPRESSION AND OPEN ROTATOR C Right 07/07/2013   Procedure: RIGHT SHOULDER ARTHROSCOPY WITH SUBACROMIAL DECOMPRESSION AND ROTATOR CUFF DEBRIDEMENT/ DISTAL CLAVICLE RESECTION;  Surgeon: [name], MD;  Location: [place];  Service: Orthopedics;  Laterality: Right;  . SHOULDER SURGERY Right 2012     OB History    Gravida  2   Para  2   Term  1    Preterm  1   AB      Living  2     SAB      TAB      Ectopic      Multiple      Live Births  2            Home Medications    Prior to Admission medications   Medication Sig Start Date End Date Taking? Authorizing Provider  apixaban (ELIQUIS) 5 MG TABS tablet Take 1 tablet (5 mg total) by mouth 2 (two) times daily. PT NEEDS APPT, CALL [phone] TO SCHEDULE, 1ST ATTEMPT 02/21/17  Yes Cross, Melissa D, NP  citalopram (CELEXA) 20 MG tablet Take 10 mg daily by mouth.    Yes [provider]  diltiazem (CARDIZEM) 30 MG tablet Take 30 mg by mouth See admin instructions. Pt to take as needed if going into AFIB   Yes [provider]  flecainide (TAMBOCOR) 100 MG tablet Take 300 mg by mouth as needed. TAKE 3 TABLETS (300 MG) ONCE AS NEEDED   Yes [provider]  hydrochlorothiazide (HYDRODIURIL) 25 MG tablet Take 25 mg by mouth daily.  08/13/16  Yes [provider]  insulin NPH-regular Human (NOVOLIN 70/30) (70-30) 100 UNIT/ML injection Inject 47 Units into the skin 2 (two) times daily with a meal. Pt takes 48 units every morning, and 48 units every evening   Yes [provider]  INSULIN SYRINGE 1CC/29G 29G X 1/2" 1 ML MISC  10/05/14  Yes [provider]  levothyroxine (SYNTHROID) 112 MCG tablet Take 1 tablet (112 mcg total) by mouth daily before breakfast. 01/10/17  Yes Florian Buff, MD  losartan (COZAAR) 100 MG tablet Take 50 mg daily by mouth.    Yes [provider]  Misc Natural Products (ESSIAC TONIC) CAPS Take 2 capsules by mouth 2 (two) times daily.   Yes [provider]  Multiple Vitamins-Minerals (ICAPS) CAPS Take 1 capsule by mouth daily.    Yes [provider]  Polyethyl Glycol-Propyl Glycol (SYSTANE) 0.4-0.3 % SOLN Place 1 drop into both eyes daily as needed (dry eyes).    Yes [provider]  Probiotic Product (PROBIOTIC DAILY PO) Take 1 tablet by mouth daily.   Yes [provider]    Family History Family History  Problem Relation Age of Onset  . Sudden death Brother        age 49s, died while playing raquetball  . Stroke Mother   . Cancer Father        throat  . Tongue cancer Father   . Heart disease Maternal Grandmother   . Cancer Maternal Aunt  type unk,hx smoking  . Cancer Maternal Uncle        type unk, hx smok  . Cancer Paternal Aunt 18       thinks it was GI/colon- had hole in side and colostomy bag  . Cancer Maternal Aunt        type unk, had a colostomy, hx smoking  . Cancer Maternal Uncle        cancer maybe-?type unk  . Throat cancer Maternal Uncle        hx smoking  . Heart attack Maternal Uncle 50  . Cancer Cousin        type unk- died in 72's/50's  . Cancer Cousin        type unk, died in 80's/50's  . Breast cancer Cousin        x2  . Other Other 25       brain tumor  . Allergic rhinitis Neg Hx   . Angioedema Neg Hx   . Asthma Neg Hx   . Atopy Neg Hx   . Eczema Neg Hx   . Immunodeficiency Neg Hx   . Urticaria Neg Hx     Social History Social History   Tobacco Use  . Smoking status: Never Smoker  . Smokeless tobacco: Never Used  Substance Use Topics  . Alcohol use: No    Alcohol/week: 0.0 oz  . Drug use: No     Allergies   Ace inhibitors; Cardizem [diltiazem hcl]; Oxycodone; Statins; Sulfa antibiotics; Tape; Cortisone; Latex; and Tartrazine   Review of Systems Review of Systems  All other systems reviewed and are negative.    Physical Exam Updated Vital Signs BP (!) 151/51   Pulse (!) 59   Temp 97.9 F (36.6 C) (Oral)   Resp 14   Ht 1.676 m (5\' 6" )   Wt 106.6 kg (235 lb)   SpO2 99%   BMI 37.93 kg/m   Physical Exam  Constitutional: She appears well-developed and well-nourished. No distress.  HENT:  Head: Normocephalic and atraumatic.  Right Ear: External ear normal.  Left Ear: External ear normal.  Eyes: Conjunctivae are normal. Right eye exhibits no discharge. Left eye  exhibits no discharge. No scleral icterus.  Neck: Neck supple. No tracheal deviation present.  Cardiovascular: Normal rate, regular rhythm and intact distal pulses.  Pulmonary/Chest: Effort normal and breath sounds normal. No stridor. No respiratory distress. She has no wheezes. She has no rales.  Abdominal: Soft. Bowel sounds are normal. She exhibits no distension. There is no tenderness. There is no rebound and no guarding.  Musculoskeletal: She exhibits no edema or tenderness.  Neurological: She is alert. She has normal strength. No cranial nerve deficit (no facial droop, extraocular movements intact, no slurred speech) or sensory deficit. She exhibits normal muscle tone. She displays no seizure activity. Coordination normal.  No pronator drift, able to lift both legs off the bed without difficulty  Skin: Skin is warm and dry. No rash noted.  Psychiatric: She has a normal mood and affect.  Nursing note and vitals reviewed.    ED Treatments / Results  Labs (all labs ordered are listed, but only abnormal results are displayed) Labs Reviewed  BASIC METABOLIC PANEL - Abnormal; Notable for the following components:      Result Value   Glucose, Bld 185 (*)    Creatinine, Ser 1.23 (*)    GFR calc non Af Amer 42 (*)    GFR calc Af Amer 49 (*)  All other components within normal limits  CBC WITH DIFFERENTIAL/PLATELET - Abnormal; Notable for the following components:   RBC 3.81 (*)    All other components within normal limits  CBG MONITORING, ED - Abnormal; Notable for the following components:   Glucose-Capillary 202 (*)    All other components within normal limits  TROPONIN I    EKG EKG Interpretation  Date/Time:  Sunday November 10 2017 10:20:15 EDT Ventricular Rate:  60 PR Interval:    QRS Duration: 108 QT Interval:  420 QTC Calculation: 420 R Axis:   -23 Text Interpretation:  Atrial-paced rhythm Left ventricular hypertrophy No significant change since last tracing Confirmed by  Dorie Rank [phone]) on 11/10/2017 10:23:22 AM   Radiology Dg Hip Unilat W Or Wo Pelvis 2-3 Views Right  Result Date: 11/10/2017 CLINICAL DATA:  Pain with syncope.  History of endometrial carcinoma EXAM: DG HIP (WITH OR WITHOUT PELVIS) 2-3V RIGHT COMPARISON:  None. FINDINGS: Frontal pelvis as well as frontal and lateral right hip images were obtained. There is no fracture or dislocation. There is mild symmetric narrowing of each hip joint. Sacroiliac joints appear normal bilaterally. No blastic or lytic bone lesions. IMPRESSION: Mild symmetric narrowing of hip joints.  No fracture or dislocation. Electronically Signed   By: Lowella Grip III M.D.   On: 11/10/2017 13:08    Procedures Procedures (including critical care time)  Medications Ordered in ED Medications  0.9 %  sodium chloride infusion ( Intravenous New Bag/Given 11/10/17 1109)  sodium chloride 0.9 % bolus 500 mL (0 mLs Intravenous Stopped 11/10/17 1230)     Initial Impression / Assessment and Plan / ED Course  I have reviewed the triage vital signs and the nursing notes.  Pertinent labs & imaging results that were available during my care of the patient were reviewed by me and considered in my medical decision making (see chart for details).  Clinical Course as of Nov 11 1407  Sun Nov 10, 2017  1130 Labs reviewed.  No anemia.  No significant electrolyte abnormalities.   [NA]  3557 Pt is complaining of hip pain now.  TTP right hip   [JK]  1408 Patient has been up walking around the ED without any difficulty.  X-rays negative for fracture   [JK]  1408 Patient's pacemaker was interrogated.  No acute events noted   [JK]    Clinical Course User Index [JK] Dorie Rank, MD   Patient presented to the emergency room for evaluation after a syncopal event.  Patient has a pacemaker and her pacemaker appears to be functioning appropriately.  No dysrhythmia or pacemaker malfunction noted based on the interrogation.  Her laboratory tests  are otherwise reassuring.  Patient has been monitored in the ED for several hours.  No recurrent episodes.  Patient is not showing any signs to suggest TIA or stroke.  I have a low suspicion for an acute cardiac etiology based on her evaluation today.  Patient is feeling well without complaints.  Is possible she had a vasovagal episode.  I think it is reasonable for her to be discharged with close outpatient follow-up.  Patient is comfortable with this plan.   Final Clinical Impressions(s) / ED Diagnoses   Final diagnoses:  Syncope and collapse    ED Discharge Orders    None       Dorie Rank, MD 11/10/17 1409

## 2017-11-10 NOTE — ED Notes (Signed)
Patient transported to X-ray 

## 2017-11-11 ENCOUNTER — Ambulatory Visit (INDEPENDENT_AMBULATORY_CARE_PROVIDER_SITE_OTHER): Payer: Medicare Other | Admitting: *Deleted

## 2017-11-11 DIAGNOSIS — I441 Atrioventricular block, second degree: Secondary | ICD-10-CM

## 2017-11-12 NOTE — Progress Notes (Signed)
Remote pacemaker transmission.   

## 2017-11-26 LAB — CUP PACEART REMOTE DEVICE CHECK
Battery Remaining Longevity: 135 mo
Brady Statistic AP VS Percent: 42 %
Brady Statistic AS VP Percent: 1 %
Brady Statistic AS VS Percent: 57 %
Brady Statistic RA Percent Paced: 42 %
Date Time Interrogation Session: 20190707142405
Implantable Lead Implant Date: 20140811
Implantable Lead Location: 753860
Lead Channel Pacing Threshold Amplitude: 0.625 V
Lead Channel Pacing Threshold Pulse Width: 0.4 ms
Lead Channel Pacing Threshold Pulse Width: 0.4 ms
Lead Channel Sensing Intrinsic Amplitude: 12 mV
Lead Channel Setting Pacing Amplitude: 0.875
MDC IDC LEAD IMPLANT DT: 20140811
MDC IDC LEAD LOCATION: 753859
MDC IDC MSMT BATTERY REMAINING PERCENTAGE: 95.5 %
MDC IDC MSMT BATTERY VOLTAGE: 2.99 V
MDC IDC MSMT LEADCHNL RA IMPEDANCE VALUE: 430 Ohm
MDC IDC MSMT LEADCHNL RA SENSING INTR AMPL: 2.9 mV
MDC IDC MSMT LEADCHNL RV IMPEDANCE VALUE: 400 Ohm
MDC IDC MSMT LEADCHNL RV PACING THRESHOLD AMPLITUDE: 0.625 V
MDC IDC PG IMPLANT DT: 20140811
MDC IDC SET LEADCHNL RA PACING AMPLITUDE: 1.625
MDC IDC SET LEADCHNL RV PACING PULSEWIDTH: 0.4 ms
MDC IDC SET LEADCHNL RV SENSING SENSITIVITY: 2 mV
MDC IDC STAT BRADY AP VP PERCENT: 1 %
MDC IDC STAT BRADY RV PERCENT PACED: 1.1 %
Pulse Gen Model: 2240
Pulse Gen Serial Number: 7528878

## 2017-12-11 DIAGNOSIS — N183 Chronic kidney disease, stage 3 (moderate): Secondary | ICD-10-CM | POA: Diagnosis not present

## 2017-12-11 DIAGNOSIS — E039 Hypothyroidism, unspecified: Secondary | ICD-10-CM | POA: Diagnosis not present

## 2017-12-11 DIAGNOSIS — E1122 Type 2 diabetes mellitus with diabetic chronic kidney disease: Secondary | ICD-10-CM | POA: Diagnosis not present

## 2017-12-11 DIAGNOSIS — N189 Chronic kidney disease, unspecified: Secondary | ICD-10-CM | POA: Diagnosis not present

## 2017-12-11 DIAGNOSIS — K210 Gastro-esophageal reflux disease with esophagitis: Secondary | ICD-10-CM | POA: Diagnosis not present

## 2017-12-11 DIAGNOSIS — I10 Essential (primary) hypertension: Secondary | ICD-10-CM | POA: Diagnosis not present

## 2017-12-16 DIAGNOSIS — E782 Mixed hyperlipidemia: Secondary | ICD-10-CM | POA: Diagnosis not present

## 2017-12-16 DIAGNOSIS — E1122 Type 2 diabetes mellitus with diabetic chronic kidney disease: Secondary | ICD-10-CM | POA: Diagnosis not present

## 2017-12-16 DIAGNOSIS — I10 Essential (primary) hypertension: Secondary | ICD-10-CM | POA: Diagnosis not present

## 2017-12-16 DIAGNOSIS — E039 Hypothyroidism, unspecified: Secondary | ICD-10-CM | POA: Diagnosis not present

## 2017-12-16 DIAGNOSIS — H6121 Impacted cerumen, right ear: Secondary | ICD-10-CM | POA: Diagnosis not present

## 2018-02-04 DIAGNOSIS — Z23 Encounter for immunization: Secondary | ICD-10-CM | POA: Diagnosis not present

## 2018-02-10 ENCOUNTER — Telehealth: Payer: Self-pay

## 2018-02-10 ENCOUNTER — Ambulatory Visit (INDEPENDENT_AMBULATORY_CARE_PROVIDER_SITE_OTHER): Payer: Medicare Other | Admitting: *Deleted

## 2018-02-10 DIAGNOSIS — I441 Atrioventricular block, second degree: Secondary | ICD-10-CM

## 2018-02-10 NOTE — Telephone Encounter (Signed)
LMOVM reminding pt to send remote transmission.   

## 2018-02-12 NOTE — Progress Notes (Signed)
Remote pacemaker transmission.   

## 2018-02-14 ENCOUNTER — Encounter: Payer: Self-pay | Admitting: Gynecologic Oncology

## 2018-02-14 ENCOUNTER — Inpatient Hospital Stay: Payer: Medicare Other | Attending: Gynecologic Oncology | Admitting: Gynecologic Oncology

## 2018-02-14 VITALS — BP 149/59 | HR 66 | Temp 97.8°F | Resp 20 | Ht 66.0 in | Wt 243.7 lb

## 2018-02-14 DIAGNOSIS — Z90710 Acquired absence of both cervix and uterus: Secondary | ICD-10-CM

## 2018-02-14 DIAGNOSIS — Z8542 Personal history of malignant neoplasm of other parts of uterus: Secondary | ICD-10-CM | POA: Diagnosis not present

## 2018-02-14 DIAGNOSIS — Z90722 Acquired absence of ovaries, bilateral: Secondary | ICD-10-CM

## 2018-02-14 DIAGNOSIS — Z923 Personal history of irradiation: Secondary | ICD-10-CM | POA: Diagnosis not present

## 2018-02-14 DIAGNOSIS — Z08 Encounter for follow-up examination after completed treatment for malignant neoplasm: Secondary | ICD-10-CM | POA: Insufficient documentation

## 2018-02-14 DIAGNOSIS — C541 Malignant neoplasm of endometrium: Secondary | ICD-10-CM

## 2018-02-14 NOTE — Progress Notes (Signed)
Follow-up Note: Gyn-Onc  Alicia [name] 72 y.o. female  CC:  Chief Complaint  Patient presents with  . 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 MSI unstable (genetic testing negative). S/p vaginal brachytherapy completed December, 2018.  I recommend she follow-up at 3 monthly intervals for symptom review, physical examination and pelvic examination. Pap smear is not recommended in routine endometrial cancer surveillance. After 2 years we will space these visits to every 6 months, and then annually if recurrence has not developed within 5 years. All questions were answered.   HPI: Patient is seen today in consultation at the request of Dr. [name].  Patient is a very pleasant 72 year old gravida 2 para 2 has been menopausal since her mid-40s. She's never took any hormone replacement therapy. She had an episode of bright red bleeding which then turned to brown in July 2018. She states she was seen by her primary care provider who performed a Pap smear. She does not know what those results are. She was then referred to Dr. [name]. She underwent a pelvic ultrasound on 12/17/2016. It revealed the uterus to be 9.8 x 4.9 x 6.1 cm with an anterior subserosal fibroid measuring 4 x 3 x 2.8 cm. The endometrium was 1.59 cm. The adnexa were unremarkable. She subsequently underwent an endometrial biopsy on August 27. She had a benign endocervical polyp. 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 Is for this reason that she is referred to [place] today.  She is otherwise in her usual state of health. She states she is always cold and relates that secondary to her thyroid disease. She states last night she will cup sweating and what she describes is disoriented. She checked her sugar and her blood pressure. She states her blood pressure was okay and checked her sugar which was 56. She  then took glucose pills that she had at home and a repair. Her Accu-Cheks went from 9 then to 83 and she started feeling better. This morning her blood sugar was 96. Her last A1c was 6.3. She states that usually her sugars run from 110 220. This is a first time that she has had to take sugar pills at home. She states she's had one other episode of what she describes as hypoglycemia at a restaurant and she felt better after drinking orange juice. After she wasn't sure that was related to her atrial fibrillation she felt a little bit nauseated and occasionally with her atrial fibrillation she will. Slightly nauseated.  She denies any chest pain or shortness of breath. She had a pacemaker placed about 6 months after that began experiencing a defibrillation. She's not sure when her last A. fib episode was. She's never had a cardiac catheterization. Her cardiologist is Dr. [name] and she saw him in the summer. She does endorse some lower extremity edema. She is on hydrochlorothiazide but she last took it on Saturday she needs to get her prescription refilled. Her last mammogram was 3 years ago. She has not had once she had a pacemaker placed for fear that it would hurt. Her last colonoscopy was in 2003.  Her father's health is notable for head and neck cancer he was a heavy smoker and drink. He certainly died at the age of 18 from a tongue cancer. Her mother died of a stroke. All of her mother's siblings except one died of cancer. This includes men and women. She does not  know the types. Preop creatinine was very elevated at 2.5, however re-check was normal at 1.3.  On 10/16 she underwent robotic hysterectomy, BSO, SLN biopsy and left pelvic lymphadenectomy. Final pathology revealed a grade 2 tumor, 3.6cm with 0.2 of 2.2cm myo invasion, no LVSI and no cervical or nodal involvement. MSI unstable tumor on IHC. GENETIC TEST RESULTS: Genetic testing performed through [facility] panel  reported out on 06/14/2017 showed no pathogenic variants. Germline Genes Analyzed: MLH1, MSH2, MSH6, PMS2, APC, ATM, BARD1, BMPR1A, BRIP1, CDH1, CDKN2A, CHEK2, DICER1, MRE11A, MUTYH, NBN, PALB2, PTEN, RAD50, RAD51C, RAD51D, SMAD4, STK11, TP53, CDK4, NF1, BRCA1, BRCA2, POLD1, POLE, SMARCA4, HOXB13 (sequencing and deletion/duplication); EPCAM, GREM1 (deletion/duplication only). 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   She was determined to have high/intermediate risk factors and therefore adjuvant radiation as prescribed.   She completed adjuvant radiation between 04/04/17 - 04/25/17 receiving 30Gy in 5 fractions at 6 Gy.  She tolerated treatment well and has had no symptoms concerning for recurrence.   Review of Systems: Constitutional: Feels cold all the time, this is no change Skin: No rash Cardiovascular: No chest pain, shortness of breath, + LE edema as she has not taken her Lasix and Saturday Pulmonary: No cough or wheeze.  Gastro Intestinal:  No nausea, vomiting, constipation, or diarrhea reported. No bright red blood per rectum or change in bowel movement.  Genitourinary: No frequency, urgency, or dysuria.  + vaginal bleeding and discharge.  Musculoskeletal: + Bilateral knee pain. She states that she needs injections per mail in her right knee. She had arthroscopic knee surgery that "did not work" as she took a blood thinner and had a "hemorrhage" into her knee.  Neurologic: No weakness Psychology: No changes  Current Meds:  Outpatient Encounter Medications as of 02/14/2018  Medication Sig  . apixaban (ELIQUIS) 5 MG TABS tablet Take 1 tablet (5 mg total) by mouth 2 (two) times daily. PT NEEDS APPT, CALL [phone] TO SCHEDULE, 1ST ATTEMPT  .  citalopram (CELEXA) 20 MG tablet Take 10 mg daily by mouth.   . diltiazem (CARDIZEM) 30 MG tablet Take 30 mg by mouth See admin instructions. Pt to take as needed if going into AFIB  . flecainide (TAMBOCOR) 100 MG tablet Take 300 mg by mouth as needed. TAKE 3 TABLETS (300 MG) ONCE AS NEEDED  . hydrochlorothiazide (HYDRODIURIL) 25 MG tablet Take 25 mg by mouth daily.   . insulin NPH-regular Human (NOVOLIN 70/30) (70-30) 100 UNIT/ML injection Inject 47 Units into the skin 2 (two) times daily with a meal. Pt takes 48 units every morning, and 48 units every evening  . INSULIN SYRINGE 1CC/29G 29G X 1/2" 1 ML MISC   . levothyroxine (SYNTHROID) 112 MCG tablet Take 1 tablet (112 mcg total) by mouth daily before breakfast.  . Misc Natural Products (ESSIAC TONIC) CAPS Take 2 capsules by mouth 2 (two) times daily.  . Multiple Vitamins-Minerals (ICAPS) CAPS Take 1 capsule by mouth daily.   [name] (SYSTANE) 0.4-0.3 % SOLN Place 1 drop into both eyes daily as needed (dry eyes).   . Probiotic Product (PROBIOTIC DAILY PO) Take 1 tablet by mouth daily.  . [DISCONTINUED] losartan (COZAAR) 100 MG tablet Take 50 mg daily by mouth.    No facility-administered encounter medications on file as of 02/14/2018.  Allergy:  Allergies  Allergen Reactions  . Ace Inhibitors Cough  . Cardizem [Diltiazem Hcl] Other (See Comments)    headache  . Oxycodone Nausea Only  . Statins Other (See Comments)    myalgias  . Sulfa Antibiotics Hives and Swelling  . Tape     Paper tape is okay  . Cortisone Rash    flushing  . Latex Rash    Sensitive not allergic  . Tartrazine Rash    Social Hx:   Social History   Socioeconomic History  . Marital status: Married    Spouse name: Not on file  . Number of children: 2  . Years of education: Not on file  . Highest education level: Not on file  Occupational History  . Occupation: retired  Scientific laboratory technician  . Financial resource strain: Not on file   . Food insecurity:    Worry: Not on file    Inability: Not on file  . Transportation needs:    Medical: Not on file    Non-medical: Not on file  Tobacco Use  . Smoking status: Never Smoker  . Smokeless tobacco: Never Used  Substance and Sexual Activity  . Alcohol use: No    Alcohol/week: 0.0 standard drinks  . Drug use: No  . Sexual activity: Not Currently    Birth control/protection: Post-menopausal  Lifestyle  . Physical activity:    Days per week: Not on file    Minutes per session: Not on file  . Stress: Not on file  Relationships  . Social connections:    Talks on phone: Not on file    Gets together: Not on file    Attends religious service: Not on file    Active member of club or organization: Not on file    Attends meetings of clubs or organizations: Not on file    Relationship status: Not on file  . Intimate partner violence:    Fear of current or ex partner: Not on file    Emotionally abused: Not on file    Physically abused: Not on file    Forced sexual activity: Not on file  Other Topics Concern  . Not on file  Social History Narrative   Retired and lives in [place] with spouse    Past Surgical Hx:  Past Surgical History:  Procedure Laterality Date  . ANKLE SURGERY Right    multiple  . BREAST SURGERY     biopsy L breast benign  . CHOLECYSTECTOMY    . COLONOSCOPY    . EYE SURGERY     both cataracts  . FOOT ARTHROTOMY     left  . FOOT SURGERY     Bil  bone spurs /Left  foot neuroma removed Right foot sinus tarside surgery Right bone in toe removed  . KNEE SURGERY     right  . 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 PERMANENT PACEMAKER INSERTION N/A 12/15/2012   SJM 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 by Dr Lovena Le for transient AV block  . ROBOTIC ASSISTED TOTAL HYSTERECTOMY WITH BILATERAL SALPINGO OOPHERECTOMY N/A 02/19/2017   Procedure: XI ROBOTIC ASSISTED TOTAL HYSTERECTOMY  WITH BILATERAL SALPINGO OOPHORECTOMY;  Surgeon: Everitt Amber, MD;  Location: [facility];  Service: Gynecology;  Laterality: N/A;  . SENTINEL NODE BIOPSY N/A 02/19/2017   Procedure: SENTINEL NODE BIOPSY;  Surgeon: Everitt Amber, MD;  Location: [facility];  Service: Gynecology;  Laterality: N/A;  .  SHOULDER ARTHROSCOPY WITH SUBACROMIAL DECOMPRESSION AND OPEN ROTATOR C Right 07/07/2013   Procedure: RIGHT SHOULDER ARTHROSCOPY WITH SUBACROMIAL DECOMPRESSION AND ROTATOR CUFF DEBRIDEMENT/ DISTAL CLAVICLE RESECTION;  Surgeon: [name], MD;  Location: [facility];  Service: Orthopedics;  Laterality: Right;  . SHOULDER SURGERY Right 2012    Past Medical Hx:  Past Medical History:  Diagnosis Date  . Anemia    hx of  . Anxiety   . Cancer [facility])    endometrial cancer/ skin cancers removed nose non melanoma  . Diabetes ([place])    Type 2  . DJD (degenerative joint disease)   . Dysrhythmia    SYMPTOMATIC BRADYCARDIA  / Afib  . Family history of breast cancer   . Family history of cancer   . Gallstones   . History of kidney stones    Hx of   . History of radiation therapy 04/04/17-04/25/17   vaginal cuff 30 Gy in 5 fractions  . Hypertension    HCTZ  . Hypothyroidism   . Obesity   . 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 Sleep apnea    No machine Cant tolerate  . 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  . Wears glasses     Oncology Hx:    Endometrial ca [facility])   12/31/2016 Initial Diagnosis    Endometrial ca [facility])     Family Hx:  Family History  Problem Relation Age of Onset  . Sudden death Brother        age 69s, died while playing raquetball  . Stroke Mother   . Cancer Father        throat  . Tongue cancer Father   . Heart disease Maternal Grandmother   . Cancer Maternal Aunt        type unk,hx smoking  . Cancer Maternal Uncle        type unk, hx smok  . Cancer Paternal Aunt 39       thinks it was GI/colon- had hole in side and colostomy bag  . Cancer  Maternal Aunt        type unk, had a colostomy, hx smoking  . Cancer Maternal Uncle        cancer maybe-?type unk  . Throat cancer Maternal Uncle        hx smoking  . Heart attack Maternal Uncle 50  . Cancer Cousin        type unk- died in 15's/50's  . Cancer Cousin        type unk, died in 31's/50's  . Breast cancer Cousin        x2  . Other Other 25       brain tumor  . Allergic rhinitis Neg Hx   . Angioedema Neg Hx   . Asthma Neg Hx   . Atopy Neg Hx   . Eczema Neg Hx   . Immunodeficiency Neg Hx   . Urticaria Neg Hx     Vitals:  Blood pressure (!) 149/59, pulse 66, temperature 97.8 F (36.6 C), temperature source Oral, resp. rate 20, height '5\' 6"'  (1.676 m), weight 243 lb 11.2 oz (110.5 kg), SpO2 100 %.  Physical Exam: Well-nourished older white female in no acute distress.  Neck: Supple, no lymphadenopathy, no thyromegaly.  Lungs: Clear to auscultation bilaterally.  Cardiac: Regular rate and rhythm. No murmur.  Abdomen: Obese well-healed laparoscopic incisions. Abdomen is soft, nontender, nondistended. There are no palpable masses  or hepatomegaly. Exam is somewhat limited by habitus. Incisions well healed and soft  Groins: No lymphadenopathy.  Extremities: 2+ pitting edema equal bilaterally in the lower extremities. No calf tenderness.  Pelvic: External genitalia is within normal limits. Vagina is well healed at the cuff. No lesions. No masses. No lesions.     Thereasa Solo, MD 02/14/2018, 2:42 PM

## 2018-02-14 NOTE — Patient Instructions (Signed)
Please notify Dr Denman George at phone number [phone] if you notice vaginal bleeding, new pelvic or abdominal pains, bloating, feeling full easy, or a change in bladder or bowel function.   Please return to see Dr Sondra Come as scheduled in December, 2019. When checking out from that appointment request that his staff call up to Dr Serita Grit office to schedule follow-up with her in March, 2020.

## 2018-02-19 ENCOUNTER — Encounter: Payer: Self-pay | Admitting: Cardiology

## 2018-03-04 ENCOUNTER — Other Ambulatory Visit: Payer: Self-pay | Admitting: Obstetrics & Gynecology

## 2018-03-05 DIAGNOSIS — I10 Essential (primary) hypertension: Secondary | ICD-10-CM | POA: Diagnosis not present

## 2018-03-05 DIAGNOSIS — E782 Mixed hyperlipidemia: Secondary | ICD-10-CM | POA: Diagnosis not present

## 2018-03-28 LAB — CUP PACEART REMOTE DEVICE CHECK
Battery Remaining Longevity: 135 mo
Battery Remaining Percentage: 95.5 %
Battery Voltage: 2.99 V
Brady Statistic AP VP Percent: 1 %
Brady Statistic AS VS Percent: 60 %
Brady Statistic RA Percent Paced: 39 %
Implantable Lead Implant Date: 20140811
Implantable Lead Location: 753859
Implantable Pulse Generator Implant Date: 20140811
Lead Channel Impedance Value: 410 Ohm
Lead Channel Pacing Threshold Amplitude: 0.5 V
Lead Channel Pacing Threshold Pulse Width: 0.4 ms
Lead Channel Pacing Threshold Pulse Width: 0.4 ms
Lead Channel Sensing Intrinsic Amplitude: 12 mV
Lead Channel Sensing Intrinsic Amplitude: 3.3 mV
Lead Channel Setting Pacing Amplitude: 0.875
Lead Channel Setting Pacing Amplitude: 1.5 V
Lead Channel Setting Sensing Sensitivity: 2 mV
MDC IDC LEAD IMPLANT DT: 20140811
MDC IDC LEAD LOCATION: 753860
MDC IDC MSMT LEADCHNL RA IMPEDANCE VALUE: 430 Ohm
MDC IDC MSMT LEADCHNL RV PACING THRESHOLD AMPLITUDE: 0.625 V
MDC IDC PG SERIAL: 7528878
MDC IDC SESS DTM: 20191007235328
MDC IDC SET LEADCHNL RV PACING PULSEWIDTH: 0.4 ms
MDC IDC STAT BRADY AP VS PERCENT: 39 %
MDC IDC STAT BRADY AS VP PERCENT: 1 %
MDC IDC STAT BRADY RV PERCENT PACED: 1.2 %

## 2018-04-07 ENCOUNTER — Other Ambulatory Visit: Payer: Self-pay

## 2018-04-07 ENCOUNTER — Encounter: Payer: Self-pay | Admitting: Radiation Oncology

## 2018-04-07 ENCOUNTER — Ambulatory Visit
Admission: RE | Admit: 2018-04-07 | Discharge: 2018-04-07 | Disposition: A | Discharge status: Home / Self Care | Payer: Medicare Other | Source: Ambulatory Visit | Attending: Radiation Oncology | Admitting: Radiation Oncology

## 2018-04-07 VITALS — BP 141/50 | HR 67 | Temp 97.6°F | Resp 18 | Wt 239.4 lb

## 2018-04-07 DIAGNOSIS — Z794 Long term (current) use of insulin: Secondary | ICD-10-CM | POA: Diagnosis not present

## 2018-04-07 DIAGNOSIS — C541 Malignant neoplasm of endometrium: Secondary | ICD-10-CM | POA: Diagnosis present

## 2018-04-07 DIAGNOSIS — Z08 Encounter for follow-up examination after completed treatment for malignant neoplasm: Secondary | ICD-10-CM | POA: Diagnosis not present

## 2018-04-07 DIAGNOSIS — Z79899 Other long term (current) drug therapy: Secondary | ICD-10-CM | POA: Diagnosis not present

## 2018-04-07 DIAGNOSIS — Z7901 Long term (current) use of anticoagulants: Secondary | ICD-10-CM | POA: Diagnosis not present

## 2018-04-07 DIAGNOSIS — R530 Neoplastic (malignant) related fatigue: Secondary | ICD-10-CM | POA: Diagnosis not present

## 2018-04-07 NOTE — Progress Notes (Signed)
Radiation Oncology         [phone] ________________________________  Name: Alicia [name] MRN: [id number]  Date: 04/07/2018  DOB: [birth year]  Follow-Up Visit Note  CC: Caryl Bis, MD  Everitt Amber, MD    ICD-10-CM   1. Endometrial cancer ([place]) C54.1     Diagnosis:   72 y.o. female with Stage I-A grade2endometrioid endometrialcancer   Interval Since Last Radiation:  11 months, 2 weeks  Radiation treatment dates:   04/04/2017, 04/11/2017, 04/18/2017, 04/22/2017, 04/25/2017 Site/dose:   The vagina cuff was treated to 30 Gy in 5 fractions of 6 Gy.  Narrative:  The patient returns today for routine follow-up. She is doing well overall. She is unaccompanied today. She sees Dr. Olena Heckle for primary care every 3 months for blood work. She saw Dr. Denman George 2 months ago.   On review of systems, she reports persisting fatigue from radiation. She reports her blood work has not been able to determine the cause. She has discontinued using her dilator, but states she will resume. she denies vaginal bleeding and any other symptoms. Pertinent positives are listed and detailed within the above HPI.  ALLERGIES:  is allergic to ace inhibitors; cardizem [diltiazem hcl]; oxycodone; statins; sulfa antibiotics; tape; cortisone; latex; and tartrazine.  Meds: Current Outpatient Medications  Medication Sig Dispense Refill  . apixaban (ELIQUIS) 5 MG TABS tablet Take 1 tablet (5 mg total) by mouth 2 (two) times daily. PT NEEDS APPT, CALL [phone] TO SCHEDULE, 1ST ATTEMPT 60 tablet 0  . citalopram (CELEXA) 20 MG tablet Take 10 mg daily by mouth.     . ezetimibe (ZETIA) 10 MG tablet     . flecainide (TAMBOCOR) 100 MG tablet Take 300 mg by mouth as needed. TAKE 3 TABLETS (300 MG) ONCE AS NEEDED    . hydrochlorothiazide (HYDRODIURIL) 25 MG tablet Take 25 mg by mouth daily.     . insulin NPH-regular Human (NOVOLIN 70/30) (70-30) 100 UNIT/ML injection Inject 47 Units into the skin 2 (two) times daily  with a meal. Pt takes 48 units every morning, and 48 units every evening    . INSULIN SYRINGE 1CC/29G 29G X 1/2" 1 ML MISC     . levothyroxine (SYNTHROID, LEVOTHROID) 112 MCG tablet TAKE 1 TABLET ONCE DAILY BEFORE BREAKFAST 30 tablet 3  . Misc Natural Products (ESSIAC TONIC) CAPS Take 2 capsules by mouth 2 (two) times daily.    . Multiple Vitamins-Minerals (ICAPS) CAPS Take 1 capsule by mouth daily.     [name] (SYSTANE) 0.4-0.3 % SOLN Place 1 drop into both eyes daily as needed (dry eyes).     . Probiotic Product (PROBIOTIC DAILY PO) Take 1 tablet by mouth daily.    [name] diltiazem (CARDIZEM) 30 MG tablet Take 30 mg by mouth See admin instructions. Pt to take as needed if going into AFIB     No current facility-administered medications for this encounter.     Physical Findings: The patient is in no acute distress. Patient is alert and oriented.  weight is 239 lb 6.4 oz (108.6 kg). Her temperature is 97.6 F (36.4 C). Her blood pressure is 141/50 (abnormal) and her pulse is 67. Her respiration is 18 and oxygen saturation is 99%.   Lungs are clear to auscultation bilaterally. Heart has regular rate and rhythm. No palpable cervical, supraclavicular, or axillary adenopathy. Abdomen soft, non-tender, normal bowel sounds. On pelvic examination the external genitalia were unremarkable. A speculum exam was performed. There are no  mucosal lesions noted in the vaginal vault. On bimanual and rectovaginal examination there were no pelvic masses appreciated. She has a slight white vaginal discharge but denies any itching or vaginal drainage.  Lab Findings: Lab Results  Component Value Date   WBC 6.7 11/10/2017   HGB 12.0 11/10/2017   HCT 36.5 11/10/2017   MCV 95.8 11/10/2017   PLT 242 11/10/2017    Radiographic Findings: No results found.  Impression:  No evidence of recurrence on clinical exam.  Plan:  Follow up with Dr. Denman George in March. Follow up with radiation oncology in  6 months.   ____________________________________  Blair Promise, PhD, MD  This document serves as a record of services personally performed by Gery Pray, MD. It was created on his behalf by Jamileth-Margaret Loma Messing, a trained medical scribe. The creation of this record is based on the scribe's personal observations and the provider's statements to them. This document has been checked and approved by the attending provider.

## 2018-04-07 NOTE — Progress Notes (Signed)
Alicia [name] is here for a follow-up appointment today with Dr. Sondra Come.Patient denies any pain . Patient reports serve fatigue. Patient denies any nausea or vomiting. Patient states that she has diarrhea sometime . Patient states that she takes Imodium for relief.Patient denies any vaginal or rectal bleeding. Patient states that she has some vaginal discharge. Patient states that the discharge is light yellow-to brown in color and that it has an odor. Patient denies any dysuria.Patient states that she is not using her vaginal dilator. Vitals:   04/07/18 1503  BP: (!) 141/50  Pulse: 67  Resp: 18  Temp: 97.6 F (36.4 C)  SpO2: 99%  Weight: 239 lb 6.4 oz (108.6 kg)   Wt Readings from Last 3 Encounters:  04/07/18 239 lb 6.4 oz (108.6 kg)  02/14/18 243 lb 11.2 oz (110.5 kg)  11/10/17 235 lb (106.6 kg)

## 2018-04-10 ENCOUNTER — Other Ambulatory Visit: Payer: Self-pay

## 2018-04-10 NOTE — Patient Outreach (Signed)
[facility]) Care Management  04/10/2018  Alicia [name] 1946/04/05 [id number]   Medication Adherence call to [name] left a message for patient to call back patient is due on Losartan 100 mg. [name] is showing past due under [place].    [facility] Direct Dial [phone]  Fax [phone] [email or web address]

## 2018-04-15 DIAGNOSIS — E1165 Type 2 diabetes mellitus with hyperglycemia: Secondary | ICD-10-CM | POA: Diagnosis not present

## 2018-04-15 DIAGNOSIS — E1122 Type 2 diabetes mellitus with diabetic chronic kidney disease: Secondary | ICD-10-CM | POA: Diagnosis not present

## 2018-04-15 DIAGNOSIS — E782 Mixed hyperlipidemia: Secondary | ICD-10-CM | POA: Diagnosis not present

## 2018-04-15 DIAGNOSIS — Z9189 Other specified personal risk factors, not elsewhere classified: Secondary | ICD-10-CM | POA: Diagnosis not present

## 2018-04-18 DIAGNOSIS — I10 Essential (primary) hypertension: Secondary | ICD-10-CM | POA: Diagnosis not present

## 2018-04-18 DIAGNOSIS — E039 Hypothyroidism, unspecified: Secondary | ICD-10-CM | POA: Diagnosis not present

## 2018-04-18 DIAGNOSIS — L500 Allergic urticaria: Secondary | ICD-10-CM | POA: Diagnosis not present

## 2018-04-18 DIAGNOSIS — E782 Mixed hyperlipidemia: Secondary | ICD-10-CM | POA: Diagnosis not present

## 2018-04-28 DIAGNOSIS — I708 Atherosclerosis of other arteries: Secondary | ICD-10-CM | POA: Diagnosis not present

## 2018-04-28 DIAGNOSIS — H35033 Hypertensive retinopathy, bilateral: Secondary | ICD-10-CM | POA: Diagnosis not present

## 2018-04-28 DIAGNOSIS — E113293 Type 2 diabetes mellitus with mild nonproliferative diabetic retinopathy without macular edema, bilateral: Secondary | ICD-10-CM | POA: Diagnosis not present

## 2018-04-28 DIAGNOSIS — H353132 Nonexudative age-related macular degeneration, bilateral, intermediate dry stage: Secondary | ICD-10-CM | POA: Diagnosis not present

## 2018-05-14 ENCOUNTER — Ambulatory Visit (INDEPENDENT_AMBULATORY_CARE_PROVIDER_SITE_OTHER): Payer: Medicare Other

## 2018-05-14 DIAGNOSIS — I441 Atrioventricular block, second degree: Secondary | ICD-10-CM

## 2018-05-15 LAB — CUP PACEART REMOTE DEVICE CHECK
Battery Remaining Percentage: 95.5 %
Battery Voltage: 2.99 V
Brady Statistic AP VS Percent: 40 %
Brady Statistic AS VS Percent: 58 %
Implantable Lead Implant Date: 20140811
Implantable Lead Implant Date: 20140811
Lead Channel Pacing Threshold Amplitude: 0.5 V
Lead Channel Pacing Threshold Amplitude: 0.625 V
Lead Channel Pacing Threshold Pulse Width: 0.4 ms
Lead Channel Sensing Intrinsic Amplitude: 3.6 mV
Lead Channel Setting Pacing Amplitude: 0.875
MDC IDC LEAD LOCATION: 753859
MDC IDC LEAD LOCATION: 753860
MDC IDC MSMT BATTERY REMAINING LONGEVITY: 135 mo
MDC IDC MSMT LEADCHNL RA IMPEDANCE VALUE: 430 Ohm
MDC IDC MSMT LEADCHNL RA PACING THRESHOLD PULSEWIDTH: 0.4 ms
MDC IDC MSMT LEADCHNL RV IMPEDANCE VALUE: 400 Ohm
MDC IDC MSMT LEADCHNL RV SENSING INTR AMPL: 12 mV
MDC IDC PG IMPLANT DT: 20140811
MDC IDC SESS DTM: 20200106191143
MDC IDC SET LEADCHNL RA PACING AMPLITUDE: 1.5 V
MDC IDC SET LEADCHNL RV PACING PULSEWIDTH: 0.4 ms
MDC IDC SET LEADCHNL RV SENSING SENSITIVITY: 2 mV
MDC IDC STAT BRADY AP VP PERCENT: 1 %
MDC IDC STAT BRADY AS VP PERCENT: 1 %
MDC IDC STAT BRADY RA PERCENT PACED: 40 %
MDC IDC STAT BRADY RV PERCENT PACED: 1.1 %
Pulse Gen Model: 2240
Pulse Gen Serial Number: 7528878

## 2018-05-15 NOTE — Progress Notes (Signed)
Remote pacemaker transmission.   

## 2018-05-29 DIAGNOSIS — G4730 Sleep apnea, unspecified: Secondary | ICD-10-CM | POA: Diagnosis not present

## 2018-05-30 DIAGNOSIS — G4730 Sleep apnea, unspecified: Secondary | ICD-10-CM | POA: Diagnosis not present

## 2018-06-12 ENCOUNTER — Other Ambulatory Visit: Payer: Self-pay | Admitting: Obstetrics & Gynecology

## 2018-07-04 DIAGNOSIS — F331 Major depressive disorder, recurrent, moderate: Secondary | ICD-10-CM | POA: Diagnosis not present

## 2018-07-04 DIAGNOSIS — E782 Mixed hyperlipidemia: Secondary | ICD-10-CM | POA: Diagnosis not present

## 2018-07-04 DIAGNOSIS — I10 Essential (primary) hypertension: Secondary | ICD-10-CM | POA: Diagnosis not present

## 2018-07-07 ENCOUNTER — Telehealth: Payer: Self-pay

## 2018-07-07 NOTE — Telephone Encounter (Signed)
Incoming call from pt regarding making an appt with Dr Denman George for follow up - is due to see her first of March- pt prefers appt after March 22 because she will be out of town.  Appt made for March 25 th at 2:15 pm- pt agreeable. No other needs per pt at this time.

## 2018-07-24 ENCOUNTER — Telehealth: Payer: Self-pay

## 2018-07-24 ENCOUNTER — Telehealth: Payer: Self-pay | Admitting: *Deleted

## 2018-07-24 NOTE — Telephone Encounter (Signed)
Incoming call from pt- returning our call to reschedule to May 1st with Dr Denman George.  Pt agreeable, denies any issues at this time.  No other needs per pt at this time.

## 2018-07-24 NOTE — Telephone Encounter (Signed)
Called and left the patient a message to call the office back. Need to move her appt form 3/25 to the end of April/beginning of May

## 2018-07-30 ENCOUNTER — Ambulatory Visit: Payer: Medicare Other | Admitting: Gynecologic Oncology

## 2018-08-11 ENCOUNTER — Other Ambulatory Visit: Payer: Self-pay

## 2018-08-11 ENCOUNTER — Ambulatory Visit (INDEPENDENT_AMBULATORY_CARE_PROVIDER_SITE_OTHER): Payer: Medicare Other | Admitting: *Deleted

## 2018-08-11 DIAGNOSIS — I441 Atrioventricular block, second degree: Secondary | ICD-10-CM

## 2018-08-11 LAB — CUP PACEART REMOTE DEVICE CHECK
Date Time Interrogation Session: 20200406190103
Implantable Lead Implant Date: 20140811
Implantable Lead Implant Date: 20140811
Implantable Lead Location: 753859
Implantable Lead Location: 753860
Implantable Pulse Generator Implant Date: 20140811
Pulse Gen Model: 2240
Pulse Gen Serial Number: 7528878

## 2018-08-18 DIAGNOSIS — E039 Hypothyroidism, unspecified: Secondary | ICD-10-CM | POA: Diagnosis not present

## 2018-08-18 DIAGNOSIS — K210 Gastro-esophageal reflux disease with esophagitis: Secondary | ICD-10-CM | POA: Diagnosis not present

## 2018-08-18 DIAGNOSIS — I10 Essential (primary) hypertension: Secondary | ICD-10-CM | POA: Diagnosis not present

## 2018-08-18 DIAGNOSIS — E1165 Type 2 diabetes mellitus with hyperglycemia: Secondary | ICD-10-CM | POA: Diagnosis not present

## 2018-08-18 DIAGNOSIS — E782 Mixed hyperlipidemia: Secondary | ICD-10-CM | POA: Diagnosis not present

## 2018-08-18 DIAGNOSIS — N183 Chronic kidney disease, stage 3 (moderate): Secondary | ICD-10-CM | POA: Diagnosis not present

## 2018-08-20 NOTE — Progress Notes (Signed)
Remote pacemaker transmission.   

## 2018-08-21 DIAGNOSIS — Z8739 Personal history of other diseases of the musculoskeletal system and connective tissue: Secondary | ICD-10-CM | POA: Diagnosis not present

## 2018-08-21 DIAGNOSIS — I10 Essential (primary) hypertension: Secondary | ICD-10-CM | POA: Diagnosis not present

## 2018-08-21 DIAGNOSIS — E039 Hypothyroidism, unspecified: Secondary | ICD-10-CM | POA: Diagnosis not present

## 2018-08-21 DIAGNOSIS — Z23 Encounter for immunization: Secondary | ICD-10-CM | POA: Diagnosis not present

## 2018-08-21 DIAGNOSIS — E1122 Type 2 diabetes mellitus with diabetic chronic kidney disease: Secondary | ICD-10-CM | POA: Diagnosis not present

## 2018-08-21 DIAGNOSIS — Z0001 Encounter for general adult medical examination with abnormal findings: Secondary | ICD-10-CM | POA: Diagnosis not present

## 2018-08-21 DIAGNOSIS — E782 Mixed hyperlipidemia: Secondary | ICD-10-CM | POA: Diagnosis not present

## 2018-08-21 DIAGNOSIS — Z6837 Body mass index (BMI) 37.0-37.9, adult: Secondary | ICD-10-CM | POA: Diagnosis not present

## 2018-09-01 ENCOUNTER — Telehealth: Payer: Self-pay | Admitting: *Deleted

## 2018-09-01 NOTE — Telephone Encounter (Signed)
Patient called back and changed her appt to a phone visit

## 2018-09-01 NOTE — Telephone Encounter (Addendum)
Called and left the patient a message to call the office back. Need to change her appt to a WebEx.

## 2018-09-04 DIAGNOSIS — E1165 Type 2 diabetes mellitus with hyperglycemia: Secondary | ICD-10-CM | POA: Diagnosis not present

## 2018-09-04 DIAGNOSIS — I10 Essential (primary) hypertension: Secondary | ICD-10-CM | POA: Diagnosis not present

## 2018-09-05 ENCOUNTER — Encounter: Payer: Self-pay | Admitting: Gynecologic Oncology

## 2018-09-05 ENCOUNTER — Inpatient Hospital Stay: Payer: Medicare Other | Attending: Gynecologic Oncology | Admitting: Gynecologic Oncology

## 2018-09-05 DIAGNOSIS — C541 Malignant neoplasm of endometrium: Secondary | ICD-10-CM | POA: Diagnosis not present

## 2018-09-05 DIAGNOSIS — Z9221 Personal history of antineoplastic chemotherapy: Secondary | ICD-10-CM | POA: Diagnosis not present

## 2018-09-05 DIAGNOSIS — Z923 Personal history of irradiation: Secondary | ICD-10-CM | POA: Diagnosis not present

## 2018-09-05 NOTE — Progress Notes (Signed)
Gynecologic Oncology Telehealth Consult Note: Gyn-Onc  I connected with Alicia [name] on 09/05/18 at  2:15 PM EDT by telephone and verified that I am speaking with the correct person using two identifiers.  I discussed the limitations, risks, security and privacy concerns of performing an evaluation and management service by telemedicine and the availability of in-person appointments. I also discussed with the patient that there may be a patient responsible charge related to this service. The patient expressed understanding and agreed to proceed.  Other persons participating in the visit and their role in the encounter: none.  Patient's location: home Provider's location: [place] 73 y.o. female  CC:  Chief Complaint  Patient presents with  . 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 MSI high (genetic testing negative). S/p vaginal brachytherapy completed December, 2018.  I recommend she follow-up at 3 monthly intervals for symptom review, physical examination and pelvic examination. Pap smear is not recommended in routine endometrial cancer surveillance. After 2 years we will space these visits to every 6 months, and then annually if recurrence has not developed within 5 years. All questions were answered.   HPI: Patient is seen today in consultation at the request of Dr. [name].  Patient is a very pleasant 73 year old gravida 2 para 2 has been menopausal since her mid-40s. She's never took any hormone replacement therapy. She had an episode of bright red bleeding which then turned to brown in July 2018. She states she was seen by her primary care provider who performed a Pap smear. She does not know what those results are. She was then referred to Dr. [name]. She underwent a pelvic ultrasound on 12/17/2016. It revealed the uterus to be 9.8 x 4.9 x 6.1  cm with an anterior subserosal fibroid measuring 4 x 3 x 2.8 cm. The endometrium was 1.59 cm. The adnexa were unremarkable. She subsequently underwent an endometrial biopsy on August 27. She had a benign endocervical polyp. 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 Is for this reason that she is referred to [place] today.  She is otherwise in her usual state of health. She states she is always cold and relates that secondary to her thyroid disease. She states last night she will cup sweating and what she describes is disoriented. She checked her sugar and her blood pressure. She states her blood pressure was okay and checked her sugar which was 56. She then took glucose pills that she had at home and a repair. Her Accu-Cheks went from 74 then to 76 and she started feeling better. This morning her blood sugar was 96. Her last A1c was 6.3. She states that usually her sugars run from 110 220. This is a first time that she has had to take sugar pills at home. She states she's had one other episode of what she describes as hypoglycemia at a restaurant and she felt better after drinking orange juice. After she wasn't sure that was related to her atrial fibrillation she felt a little bit nauseated and occasionally with her atrial fibrillation she will. Slightly nauseated.  She denies any chest pain or shortness of breath. She had a pacemaker placed about 6 months after that began experiencing a defibrillation. She's not sure when her last A. fib episode was. She's never had a cardiac catheterization. Her cardiologist is Dr. [name] and she saw him in the summer. She does  endorse some lower extremity edema. She is on hydrochlorothiazide but she last took it on Saturday she needs to get her prescription refilled. Her last mammogram was 3 years ago. She has not had once she had a pacemaker placed for fear that it would hurt. Her last colonoscopy was in 2003.  Her father's health is notable for head and  neck cancer he was a heavy smoker and drink. He certainly died at the age of 24 from a tongue cancer. Her mother died of a stroke. All of her mother's siblings except one died of cancer. This includes men and women. She does not know the types. Preop creatinine was very elevated at 2.5, however re-check was normal at 1.3.  On 10/16 she underwent robotic hysterectomy, BSO, SLN biopsy and left pelvic lymphadenectomy. Final pathology revealed a grade 2 tumor, 3.6cm with 0.2 of 2.2cm myo invasion, no LVSI and no cervical or nodal involvement. MSI unstable tumor on IHC. GENETIC TEST RESULTS: Genetic testing performed through [facility] panel reported out on 06/14/2017 showed no pathogenic variants. Germline Genes Analyzed: MLH1, MSH2, MSH6, PMS2, APC, ATM, BARD1, BMPR1A, BRIP1, CDH1, CDKN2A, CHEK2, DICER1, MRE11A, MUTYH, NBN, PALB2, PTEN, RAD50, RAD51C, RAD51D, SMAD4, STK11, TP53, CDK4, NF1, BRCA1, BRCA2, POLD1, POLE, SMARCA4, HOXB13 (sequencing and deletion/duplication); EPCAM, GREM1 (deletion/duplication only). 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   She was determined to have high/intermediate risk factors and therefore adjuvant radiation as prescribed.   She completed adjuvant radiation between 04/04/17 - 04/25/17 receiving 30Gy in 5 fractions at 6 Gy.  Interval Hx:  She tolerated treatment well and has had no symptoms concerning for recurrence.   Review of Systems: Constitutional: Feels cold all the time, this is no change Skin: No rash Cardiovascular: No chest pain, shortness of breath, + LE edema as she has not taken her Lasix and Saturday Pulmonary: No cough or wheeze.  Gastro Intestinal:  No nausea, vomiting, constipation, or  diarrhea reported. No bright red blood per rectum or change in bowel movement.  Genitourinary: No frequency, urgency, or dysuria.  + vaginal bleeding and discharge.  Musculoskeletal: + Bilateral knee pain. She states that she needs injections per mail in her right knee. She had arthroscopic knee surgery that "did not work" as she took a blood thinner and had a "hemorrhage" into her knee.  Neurologic: No weakness Psychology: No changes  Current Meds:  Outpatient Encounter Medications as of 09/05/2018  Medication Sig  . apixaban (ELIQUIS) 5 MG TABS tablet Take 1 tablet (5 mg total) by mouth 2 (two) times daily. PT NEEDS APPT, CALL [phone] TO SCHEDULE, 1ST ATTEMPT  . citalopram (CELEXA) 20 MG tablet Take 10 mg daily by mouth.   . diltiazem (CARDIZEM) 30 MG tablet Take 30 mg by mouth See admin instructions. Pt to take as needed if going into AFIB  . flecainide (TAMBOCOR) 100 MG tablet Take 300 mg by mouth as needed. TAKE 3 TABLETS (300 MG) ONCE AS NEEDED  . hydrochlorothiazide (HYDRODIURIL) 25 MG tablet Take 25 mg by mouth daily.   . insulin NPH-regular Human (NOVOLIN 70/30) (70-30) 100 UNIT/ML injection Inject 47 Units into the skin 2 (two) times daily with a meal. Pt takes 48 units every morning, and 48 units every evening  . INSULIN SYRINGE 1CC/29G 29G X 1/2" 1 ML MISC   .  levothyroxine (SYNTHROID, LEVOTHROID) 112 MCG tablet TAKE 1 TABLET ONCE A DAY BEFORE BREAKFAST.  [name] Misc Natural Products (ESSIAC TONIC) CAPS Take 2 capsules by mouth 2 (two) times daily.  . Multiple Vitamins-Minerals (ICAPS) CAPS Take 1 capsule by mouth daily.   [name] (SYSTANE) 0.4-0.3 % SOLN Place 1 drop into both eyes daily as needed (dry eyes).   . Probiotic Product (PROBIOTIC DAILY PO) Take 1 tablet by mouth daily.  . [DISCONTINUED] ezetimibe (ZETIA) 10 MG tablet    No facility-administered encounter medications on file as of 09/05/2018.     Allergy:  Allergies  Allergen Reactions  . Ace  Inhibitors Cough  . Cardizem [Diltiazem Hcl] Other (See Comments)    headache  . Oxycodone Nausea Only  . Statins Other (See Comments)    myalgias  . Sulfa Antibiotics Hives and Swelling  . Tape     Paper tape is okay  . Cortisone Rash    flushing  . Latex Rash    Sensitive not allergic  . Tartrazine Rash    Social Hx:   Social History   Socioeconomic History  . Marital status: Married    Spouse name: Not on file  . Number of children: 2  . Years of education: Not on file  . Highest education level: Not on file  Occupational History  . Occupation: retired  Scientific laboratory technician  . Financial resource strain: Not on file  . Food insecurity:    Worry: Not on file    Inability: Not on file  . Transportation needs:    Medical: Not on file    Non-medical: Not on file  Tobacco Use  . Smoking status: Never Smoker  . Smokeless tobacco: Never Used  Substance and Sexual Activity  . Alcohol use: No    Alcohol/week: 0.0 standard drinks  . Drug use: No  . Sexual activity: Not Currently    Birth control/protection: Post-menopausal  Lifestyle  . Physical activity:    Days per week: Not on file    Minutes per session: Not on file  . Stress: Not on file  Relationships  . Social connections:    Talks on phone: Not on file    Gets together: Not on file    Attends religious service: Not on file    Active member of club or organization: Not on file    Attends meetings of clubs or organizations: Not on file    Relationship status: Not on file  . Intimate partner violence:    Fear of current or ex partner: Not on file    Emotionally abused: Not on file    Physically abused: Not on file    Forced sexual activity: Not on file  Other Topics Concern  . Not on file  Social History Narrative   Retired and lives in [place] with spouse    Past Surgical Hx:  Past Surgical History:  Procedure Laterality Date  . ANKLE SURGERY Right    multiple  . BREAST SURGERY     biopsy L breast benign   . CHOLECYSTECTOMY    . COLONOSCOPY    . EYE SURGERY     both cataracts  . FOOT ARTHROTOMY     left  . FOOT SURGERY     Bil  bone spurs /Left  foot neuroma removed Right foot sinus tarside surgery Right bone in toe removed  . KNEE SURGERY     right  . 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 PERMANENT PACEMAKER INSERTION N/A 12/15/2012   SJM 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 by Dr Lovena Le for transient AV block  . ROBOTIC ASSISTED TOTAL HYSTERECTOMY WITH BILATERAL SALPINGO OOPHERECTOMY N/A 02/19/2017   Procedure: XI ROBOTIC ASSISTED TOTAL HYSTERECTOMY WITH BILATERAL SALPINGO OOPHORECTOMY;  Surgeon: Everitt Amber, MD;  Location: [facility];  Service: Gynecology;  Laterality: N/A;  . SENTINEL NODE BIOPSY N/A 02/19/2017   Procedure: SENTINEL NODE BIOPSY;  Surgeon: Everitt Amber, MD;  Location: [facility];  Service: Gynecology;  Laterality: N/A;  . SHOULDER ARTHROSCOPY WITH SUBACROMIAL DECOMPRESSION AND OPEN ROTATOR C Right 07/07/2013   Procedure: RIGHT SHOULDER ARTHROSCOPY WITH SUBACROMIAL DECOMPRESSION AND ROTATOR CUFF DEBRIDEMENT/ DISTAL CLAVICLE RESECTION;  Surgeon: [name], MD;  Location: [place];  Service: Orthopedics;  Laterality: Right;  . SHOULDER SURGERY Right 2012    Past Medical Hx:  Past Medical History:  Diagnosis Date  . Anemia    hx of  . Anxiety   . Cancer [facility])    endometrial cancer/ skin cancers removed nose non melanoma  . Diabetes ([place])    Type 2  . DJD (degenerative joint disease)   . Dysrhythmia    SYMPTOMATIC BRADYCARDIA  / Afib  . Family history of breast cancer   . Family history of cancer   . Gallstones   . History of kidney stones    Hx of   . History of radiation therapy 04/04/17-04/25/17   vaginal cuff 30 Gy in 5 fractions  . Hypertension    HCTZ  . Hypothyroidism   . Obesity   . 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 Sleep apnea    No machine  Cant tolerate  . 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  . Wears glasses     Oncology Hx:    Endometrial ca [facility])   12/31/2016 Initial Diagnosis    Endometrial ca [facility])     Family Hx:  Family History  Problem Relation Age of Onset  . Sudden death Brother        age 109s, died while playing raquetball  . Stroke Mother   . Cancer Father        throat  . Tongue cancer Father   . Heart disease Maternal Grandmother   . Cancer Maternal Aunt        type unk,hx smoking  . Cancer Maternal Uncle        type unk, hx smok  . Cancer Paternal Aunt 47       thinks it was GI/colon- had hole in side and colostomy bag  . Cancer Maternal Aunt        type unk, had a colostomy, hx smoking  . Cancer Maternal Uncle        cancer maybe-?type unk  . Throat cancer Maternal Uncle        hx smoking  . Heart attack Maternal Uncle 50  . Cancer Cousin        type unk- died in 34's/50's  . Cancer Cousin        type unk, died in 50's/50's  . Breast cancer Cousin        x2  . Other Other 25       brain tumor  . Allergic rhinitis Neg Hx   . Angioedema Neg Hx   . Asthma Neg Hx   . Atopy Neg Hx   .  Eczema Neg Hx   . Immunodeficiency Neg Hx   . Urticaria Neg Hx     Vitals:  There were no vitals taken for this visit.  Physical Exam: No exam done due to telephone visit.  I discussed the assessment and treatment plan with the patient. The patient was provided with an opportunity to ask questions and all were answered. The patient agreed with the plan and demonstrated an understanding of the instructions.   The patient was advised to call back or see an in-person evaluation if the symptoms worsen or if the condition fails to improve as anticipated.   I provided 10 minutes of non face-to-face telephone visit time during this encounter, and > 50% was spent counseling as documented under my assessment & plan.     Thereasa Solo, MD 09/05/2018, 2:26 PM

## 2018-10-04 DIAGNOSIS — I10 Essential (primary) hypertension: Secondary | ICD-10-CM | POA: Diagnosis not present

## 2018-10-04 DIAGNOSIS — E1165 Type 2 diabetes mellitus with hyperglycemia: Secondary | ICD-10-CM | POA: Diagnosis not present

## 2018-10-13 ENCOUNTER — Ambulatory Visit: Payer: Medicare Other | Admitting: Radiation Oncology

## 2018-11-04 DIAGNOSIS — I10 Essential (primary) hypertension: Secondary | ICD-10-CM | POA: Diagnosis not present

## 2018-11-04 DIAGNOSIS — E782 Mixed hyperlipidemia: Secondary | ICD-10-CM | POA: Diagnosis not present

## 2018-11-04 DIAGNOSIS — E1122 Type 2 diabetes mellitus with diabetic chronic kidney disease: Secondary | ICD-10-CM | POA: Diagnosis not present

## 2018-11-10 ENCOUNTER — Ambulatory Visit (INDEPENDENT_AMBULATORY_CARE_PROVIDER_SITE_OTHER): Payer: Medicare Other | Admitting: *Deleted

## 2018-11-10 DIAGNOSIS — I441 Atrioventricular block, second degree: Secondary | ICD-10-CM

## 2018-11-10 LAB — CUP PACEART REMOTE DEVICE CHECK
Date Time Interrogation Session: 20200706161349
Implantable Lead Implant Date: 20140811
Implantable Lead Implant Date: 20140811
Implantable Lead Location: 753859
Implantable Lead Location: 753860
Implantable Pulse Generator Implant Date: 20140811
Pulse Gen Model: 2240
Pulse Gen Serial Number: 7528878

## 2018-11-14 ENCOUNTER — Encounter: Payer: Medicare Other | Admitting: Internal Medicine

## 2018-11-20 ENCOUNTER — Encounter: Payer: Self-pay | Admitting: Cardiology

## 2018-11-20 NOTE — Progress Notes (Signed)
Remote pacemaker transmission.   

## 2018-12-05 ENCOUNTER — Encounter: Payer: Medicare Other | Admitting: Internal Medicine

## 2018-12-11 ENCOUNTER — Other Ambulatory Visit: Payer: Self-pay

## 2018-12-11 ENCOUNTER — Encounter: Payer: Self-pay | Admitting: Radiation Oncology

## 2018-12-11 ENCOUNTER — Ambulatory Visit
Admission: RE | Admit: 2018-12-11 | Discharge: 2018-12-11 | Disposition: A | Discharge status: Home / Self Care | Payer: Medicare Other | Source: Ambulatory Visit | Attending: Radiation Oncology | Admitting: Radiation Oncology

## 2018-12-11 VITALS — BP 156/58 | HR 59 | Temp 98.3°F | Resp 18 | Ht 66.0 in | Wt 233.4 lb

## 2018-12-11 DIAGNOSIS — C541 Malignant neoplasm of endometrium: Secondary | ICD-10-CM

## 2018-12-11 DIAGNOSIS — Z79899 Other long term (current) drug therapy: Secondary | ICD-10-CM | POA: Diagnosis not present

## 2018-12-11 DIAGNOSIS — Z923 Personal history of irradiation: Secondary | ICD-10-CM | POA: Insufficient documentation

## 2018-12-11 DIAGNOSIS — Z8542 Personal history of malignant neoplasm of other parts of uterus: Secondary | ICD-10-CM | POA: Insufficient documentation

## 2018-12-11 DIAGNOSIS — Z08 Encounter for follow-up examination after completed treatment for malignant neoplasm: Secondary | ICD-10-CM | POA: Diagnosis not present

## 2018-12-11 DIAGNOSIS — Z7901 Long term (current) use of anticoagulants: Secondary | ICD-10-CM | POA: Insufficient documentation

## 2018-12-11 NOTE — Progress Notes (Signed)
Radiation Oncology         [phone] ________________________________  Name: Alicia [name] MRN: [id number]  Date: 12/11/2018  DOB: [birth year]  Follow-Up Visit Note  CC: Caryl Bis, MD  Everitt Amber, MD    ICD-10-CM   1. Endometrial cancer ([place])  C54.1     Diagnosis:   73 y.o. female with Stage I-A grade2endometrioid endometrialcancer  Interval Since Last Radiation:  1 year 8 months   HDR Radiation treatment dates:   04/04/2017, 04/11/2017, 04/18/2017, 04/22/2017, 04/25/2017  Site/dose:   The vaginal cuff was treated to 30 Gy in 5 fractions of 6 Gy.  Narrative:  The patient returns today for routine follow-up.  She was last seen by Dr. Denman George 3 months ago; physical exam deferred since this was a telephone visit due to COVID-19 issues.  On review of systems, the patient reports that she is doing well overall. She denies any vaginal bleeding or discharge at present. No other issues.  She denies any abdominal bloating.  She has not been using her vaginal dilator.  ALLERGIES:  is allergic to ace inhibitors; cardizem [diltiazem hcl]; oxycodone; statins; sulfa antibiotics; tape; cortisone; latex; and tartrazine.  Meds: Current Outpatient Medications  Medication Sig Dispense Refill  . apixaban (ELIQUIS) 5 MG TABS tablet Take 1 tablet (5 mg total) by mouth 2 (two) times daily. PT NEEDS APPT, CALL [phone] TO SCHEDULE, 1ST ATTEMPT 60 tablet 0  . citalopram (CELEXA) 20 MG tablet Take 10 mg daily by mouth.     . co-enzyme Q-10 30 MG capsule Take 30 mg by mouth daily.    [name] diltiazem (CARDIZEM) 30 MG tablet Take 30 mg by mouth See admin instructions. Pt to take as needed if going into AFIB    . flecainide (TAMBOCOR) 100 MG tablet Take 300 mg by mouth as needed. TAKE 3 TABLETS (300 MG) ONCE AS NEEDED    . hydrochlorothiazide (HYDRODIURIL) 25 MG tablet Take 25 mg by mouth daily.     . insulin NPH-regular Human (NOVOLIN 70/30) (70-30) 100 UNIT/ML injection Inject 47 Units into the  skin 2 (two) times daily with a meal. Pt takes 48 units every morning, and 48 units every evening    . INSULIN SYRINGE 1CC/29G 29G X 1/2" 1 ML MISC     . levothyroxine (SYNTHROID, LEVOTHROID) 112 MCG tablet TAKE 1 TABLET ONCE A DAY BEFORE BREAKFAST. 30 tablet 11  . Misc Natural Products (ESSIAC TONIC) CAPS Take 2 capsules by mouth 2 (two) times daily.    . Multiple Vitamins-Minerals (ICAPS) CAPS Take 1 capsule by mouth daily.     [name] (SYSTANE) 0.4-0.3 % SOLN Place 1 drop into both eyes daily as needed (dry eyes).     . Probiotic Product (PROBIOTIC DAILY PO) Take 1 tablet by mouth daily.     No current facility-administered medications for this encounter.     Physical Findings: The patient is in no acute distress. Patient is alert and oriented.  height is 5\' 6"  (1.676 m) and weight is 233 lb 6 oz (105.9 kg). Her temporal temperature is 98.3 F (36.8 C). Her blood pressure is 156/58 (abnormal) and her pulse is 59 (abnormal). Her respiration is 18 and oxygen saturation is 98%.   Lungs are clear to auscultation bilaterally. Heart has regular rate and rhythm. No palpable cervical, supraclavicular, or axillary adenopathy. Abdomen soft, non-tender, normal bowel sounds.  On pelvic examination the external genitalia were unremarkable. A speculum exam was performed. There are  no mucosal lesions noted in the vaginal vault. On bimanual and rectovaginal examination there were no pelvic masses appreciated. In the vaginal vault, the patient was noted to have white discharge consistent with a yeast infection. The patient does report a history of diabetes.   Lab Findings: Lab Results  Component Value Date   WBC 6.7 11/10/2017   HGB 12.0 11/10/2017   HCT 36.5 11/10/2017   MCV 95.8 11/10/2017   PLT 242 11/10/2017    Radiographic Findings: No results found.  Impression:  Stage I-A grade2endometrioid endometrialcancer. No evidence of recurrence on clinical exam.   Plan:  Routine follow-up in 6 months.  In the interim the patient will see Dr. Denman George.  We have talked about consideration for Diflucan however she is allergic to tartrazine  may have a problem with this medication and therefore was not prescribed.  ____________________________________  Blair Promise, PhD, MD  This document serves as a record of services personally performed by Gery Pray, MD. It was created on his behalf by Rae Lips, a trained medical scribe. The creation of this record is based on the scribe's personal observations and the provider's statements to them. This document has been checked and approved by the attending provider.

## 2018-12-11 NOTE — Patient Instructions (Signed)
Coronavirus (COVID-19) Are you at risk?  Are you at risk for the Coronavirus (COVID-19)?  To be considered HIGH RISK for Coronavirus (COVID-19), you have to meet the following criteria:  . Traveled to [place], [place], [place], [place] or [place]; or in the [place] to [place], [place], [place], or [place]; and have fever, cough, and shortness of breath within the last 2 weeks of travel OR . Been in close contact with a person diagnosed with COVID-19 within the last 2 weeks and have fever, cough, and shortness of breath . IF YOU DO NOT MEET THESE CRITERIA, YOU ARE CONSIDERED LOW RISK FOR COVID-19.  What to do if you are HIGH RISK for COVID-19?  . If you are having a medical emergency, call 911. . Seek medical care right away. Before you go to a doctor's office, urgent care or emergency department, call ahead and tell them about your recent travel, contact with someone diagnosed with COVID-19, and your symptoms. You should receive instructions from your physician's office regarding next steps of care.  . When you arrive at healthcare provider, tell the healthcare staff immediately you have returned from visiting [place], [place], [place], [place] or [place]; or traveled in the [place] to [place], [place], [place], or [place]; in the last two weeks or you have been in close contact with a person diagnosed with COVID-19 in the last 2 weeks.   . Tell the health care staff about your symptoms: fever, cough and shortness of breath. . After you have been seen by a medical provider, you will be either: o Tested for (COVID-19) and discharged home on quarantine except to seek medical care if symptoms worsen, and asked to  - Stay home and avoid contact with others until you get your results (4-5 days)  - Avoid travel on public transportation if possible (such as bus, train, or airplane) or o Sent to the Emergency Department by EMS for evaluation, COVID-19 testing, and possible  admission depending on your condition and test results.  What to do if you are LOW RISK for COVID-19?  Reduce your risk of any infection by using the same precautions used for avoiding the common cold or flu:  . Wash your hands often with soap and warm water for at least 20 seconds.  If soap and water are not readily available, use an alcohol-based hand sanitizer with at least 60% alcohol.  . If coughing or sneezing, cover your mouth and nose by coughing or sneezing into the elbow areas of your shirt or coat, into a tissue or into your sleeve (not your hands). . Avoid shaking hands with others and consider head nods or verbal greetings only. . Avoid touching your eyes, nose, or mouth with unwashed hands.  . Avoid close contact with people who are sick. . Avoid places or events with large numbers of people in one location, like concerts or sporting events. . Carefully consider travel plans you have or are making. . If you are planning any travel outside or inside the [place], visit the CDC's Travelers' Health webpage for the latest health notices. . If you have some symptoms but not all symptoms, continue to monitor at home and seek medical attention if your symptoms worsen. . If you are having a medical emergency, call 911.   ADDITIONAL HEALTHCARE OPTIONS FOR PATIENTS  [facility] / e-Visit: [email or web address]         [facility]: [phone]  [facility]: [phone]                   [facility]: [phone]   

## 2018-12-11 NOTE — Progress Notes (Signed)
Patient in for follow up doing well. Phone interview with Dr Denman George 3 months ago. Denies any bleeding or discharge at present. No issues.

## 2019-01-05 DIAGNOSIS — I10 Essential (primary) hypertension: Secondary | ICD-10-CM | POA: Diagnosis not present

## 2019-01-05 DIAGNOSIS — E1165 Type 2 diabetes mellitus with hyperglycemia: Secondary | ICD-10-CM | POA: Diagnosis not present

## 2019-01-07 DIAGNOSIS — E1122 Type 2 diabetes mellitus with diabetic chronic kidney disease: Secondary | ICD-10-CM | POA: Diagnosis not present

## 2019-01-07 DIAGNOSIS — E782 Mixed hyperlipidemia: Secondary | ICD-10-CM | POA: Diagnosis not present

## 2019-01-07 DIAGNOSIS — N183 Chronic kidney disease, stage 3 (moderate): Secondary | ICD-10-CM | POA: Diagnosis not present

## 2019-01-07 DIAGNOSIS — I4891 Unspecified atrial fibrillation: Secondary | ICD-10-CM | POA: Diagnosis not present

## 2019-01-07 DIAGNOSIS — K210 Gastro-esophageal reflux disease with esophagitis: Secondary | ICD-10-CM | POA: Diagnosis not present

## 2019-01-07 DIAGNOSIS — E8881 Metabolic syndrome: Secondary | ICD-10-CM | POA: Diagnosis not present

## 2019-01-07 DIAGNOSIS — I10 Essential (primary) hypertension: Secondary | ICD-10-CM | POA: Diagnosis not present

## 2019-01-07 DIAGNOSIS — E039 Hypothyroidism, unspecified: Secondary | ICD-10-CM | POA: Diagnosis not present

## 2019-01-13 DIAGNOSIS — E1122 Type 2 diabetes mellitus with diabetic chronic kidney disease: Secondary | ICD-10-CM | POA: Diagnosis not present

## 2019-01-13 DIAGNOSIS — Z8739 Personal history of other diseases of the musculoskeletal system and connective tissue: Secondary | ICD-10-CM | POA: Diagnosis not present

## 2019-01-13 DIAGNOSIS — Z23 Encounter for immunization: Secondary | ICD-10-CM | POA: Diagnosis not present

## 2019-01-13 DIAGNOSIS — Z6837 Body mass index (BMI) 37.0-37.9, adult: Secondary | ICD-10-CM | POA: Diagnosis not present

## 2019-01-13 DIAGNOSIS — E039 Hypothyroidism, unspecified: Secondary | ICD-10-CM | POA: Diagnosis not present

## 2019-01-13 DIAGNOSIS — E782 Mixed hyperlipidemia: Secondary | ICD-10-CM | POA: Diagnosis not present

## 2019-01-13 DIAGNOSIS — I10 Essential (primary) hypertension: Secondary | ICD-10-CM | POA: Diagnosis not present

## 2019-01-13 DIAGNOSIS — C541 Malignant neoplasm of endometrium: Secondary | ICD-10-CM | POA: Diagnosis not present

## 2019-02-09 ENCOUNTER — Ambulatory Visit (INDEPENDENT_AMBULATORY_CARE_PROVIDER_SITE_OTHER): Payer: Medicare Other | Admitting: *Deleted

## 2019-02-09 DIAGNOSIS — I441 Atrioventricular block, second degree: Secondary | ICD-10-CM

## 2019-02-09 DIAGNOSIS — R55 Syncope and collapse: Secondary | ICD-10-CM

## 2019-02-10 LAB — CUP PACEART REMOTE DEVICE CHECK
Battery Remaining Longevity: 135 mo
Battery Remaining Percentage: 95.5 %
Battery Voltage: 2.99 V
Brady Statistic AP VP Percent: 1 %
Brady Statistic AP VS Percent: 47 %
Brady Statistic AS VP Percent: 1 %
Brady Statistic AS VS Percent: 52 %
Brady Statistic RA Percent Paced: 47 %
Brady Statistic RV Percent Paced: 1 %
Date Time Interrogation Session: 20201005143702
Implantable Lead Implant Date: 20140811
Implantable Lead Implant Date: 20140811
Implantable Lead Location: 753859
Implantable Lead Location: 753860
Implantable Pulse Generator Implant Date: 20140811
Lead Channel Impedance Value: 360 Ohm
Lead Channel Impedance Value: 430 Ohm
Lead Channel Pacing Threshold Amplitude: 0.375 V
Lead Channel Pacing Threshold Amplitude: 0.5 V
Lead Channel Pacing Threshold Pulse Width: 0.4 ms
Lead Channel Pacing Threshold Pulse Width: 0.4 ms
Lead Channel Sensing Intrinsic Amplitude: 12 mV
Lead Channel Sensing Intrinsic Amplitude: 2.8 mV
Lead Channel Setting Pacing Amplitude: 0.75 V
Lead Channel Setting Pacing Amplitude: 1.375
Lead Channel Setting Pacing Pulse Width: 0.4 ms
Lead Channel Setting Sensing Sensitivity: 2 mV
Pulse Gen Model: 2240
Pulse Gen Serial Number: 7528878

## 2019-02-17 NOTE — Progress Notes (Signed)
Remote pacemaker transmission.   

## 2019-02-24 DIAGNOSIS — H6123 Impacted cerumen, bilateral: Secondary | ICD-10-CM | POA: Diagnosis not present

## 2019-03-24 ENCOUNTER — Telehealth: Payer: Self-pay | Admitting: Internal Medicine

## 2019-03-24 NOTE — Telephone Encounter (Signed)
Virtual Visit Pre-Appointment Phone Call  "(Name), I am calling you today to discuss your upcoming appointment. We are currently trying to limit exposure to the virus that causes COVID-19 by seeing patients at home rather than in the office."  1. "What is the BEST phone number to call the day of the visit?" - include this in appointment notes  2. Do you have or have access to (through a family member/friend) a smartphone with video capability that we can use for your visit?" a. If yes - list this number in appt notes as cell (if different from BEST phone #) and list the appointment type as a VIDEO visit in appointment notes b. If no - list the appointment type as a PHONE visit in appointment notes  3. Confirm consent - "In the setting of the current Covid19 crisis, you are scheduled for a (phone or video) visit with your provider on (date) at (time).  Just as we do with many in-office visits, in order for you to participate in this visit, we must obtain consent.  If you'd like, I can send this to your mychart (if signed up) or email for you to review.  Otherwise, I can obtain your verbal consent now.  All virtual visits are billed to your insurance company just like a normal visit would be.  By agreeing to a virtual visit, we'd like you to understand that the technology does not allow for your provider to perform an examination, and thus may limit your provider's ability to fully assess your condition. If your provider identifies any concerns that need to be evaluated in person, we will make arrangements to do so.  Finally, though the technology is pretty good, we cannot assure that it will always work on either your or our end, and in the setting of a video visit, we may have to convert it to a phone-only visit.  In either situation, we cannot ensure that we have a secure connection.  Are you willing to proceed?" STAFF: Did the patient verbally acknowledge consent to telehealth visit? Document  YES/NO here: yes  4. Advise patient to be prepared - "Two hours prior to your appointment, go ahead and check your blood pressure, pulse, oxygen saturation, and your weight (if you have the equipment to check those) and write them all down. When your visit starts, your provider will ask you for this information. If you have an Apple Watch or Kardia device, please plan to have heart rate information ready on the day of your appointment. Please have a pen and paper handy nearby the day of the visit as well."  5. Give patient instructions for MyChart download to smartphone OR Doximity/Doxy.me as below if video visit (depending on what platform provider is using)  6. Inform patient they will receive a phone call 15 minutes prior to their appointment time (may be from unknown caller ID) so they should be prepared to answer    Alicia [name] has been deemed a candidate for a follow-up tele-health visit to limit community exposure during the Covid-19 pandemic. I spoke with the patient via phone to ensure availability of phone/video source, confirm preferred email & phone number, and discuss instructions and expectations.  I reminded Alicia [name] to be prepared with any vital sign and/or heart rhythm information that could potentially be obtained via home monitoring, at the time of her visit. I reminded Alicia [name] to expect a phone call prior to  her visit.  Alicia [name] 03/24/2019 10:34 AM   INSTRUCTIONS FOR DOWNLOADING THE MYCHART APP TO SMARTPHONE  - The patient must first make sure to have activated MyChart and know their login information - If Apple, go to [employer] and type in MyChart in the search bar and download the app. If Android, ask patient to go to [employer] and type in [place] in the search bar and download the app. The app is free but as with any other app downloads, their phone may require them to verify saved payment information or Apple/Android  password.  - The patient will need to then log into the app with their MyChart username and password, and select [facility] as their healthcare provider to link the account. When it is time for your visit, go to the MyChart app, find appointments, and click Begin Video Visit. Be sure to Select Allow for your device to access the Microphone and Camera for your visit. You will then be connected, and your provider will be with you shortly.  **If they have any issues connecting, or need assistance please contact MyChart service desk [phone])**  **If using a computer, in order to ensure the best quality for their visit they will need to use either of the following Internet Browsers: [employer], or Google Chrome**  IF USING DOXIMITY or DOXY.ME - The patient will receive a link just prior to their visit by text.     FULL LENGTH CONSENT FOR TELE-HEALTH VISIT   I hereby voluntarily request, consent and authorize [facility] and its employed or contracted physicians, physician assistants, nurse practitioners or other licensed health care professionals (the Practitioner), to provide me with telemedicine health care services (the Services") as deemed necessary by the treating Practitioner. I acknowledge and consent to receive the Services by the Practitioner via telemedicine. I understand that the telemedicine visit will involve communicating with the Practitioner through live audiovisual communication technology and the disclosure of certain medical information by electronic transmission. I acknowledge that I have been given the opportunity to request an in-person assessment or other available alternative prior to the telemedicine visit and am voluntarily participating in the telemedicine visit.  I understand that I have the right to withhold or withdraw my consent to the use of telemedicine in the course of my care at any time, without affecting my right to future care or treatment,  and that the Practitioner or I may terminate the telemedicine visit at any time. I understand that I have the right to inspect all information obtained and/or recorded in the course of the telemedicine visit and may receive copies of available information for a reasonable fee.  I understand that some of the potential risks of receiving the Services via telemedicine include:   Delay or interruption in medical evaluation due to technological equipment failure or disruption;  Information transmitted may not be sufficient (e.g. poor resolution of images) to allow for appropriate medical decision making by the Practitioner; and/or   In rare instances, security protocols could fail, causing a breach of personal health information.  Furthermore, I acknowledge that it is my responsibility to provide information about my medical history, conditions and care that is complete and accurate to the best of my ability. I acknowledge that Practitioner's advice, recommendations, and/or decision may be based on factors not within their control, such as incomplete or inaccurate data provided by me or distortions of diagnostic images or specimens that may result from electronic transmissions. I  understand that the practice of medicine is not an exact science and that Practitioner makes no warranties or guarantees regarding treatment outcomes. I acknowledge that I will receive a copy of this consent concurrently upon execution via email to the email address I last provided but may also request a printed copy by calling the office of [facility].    I understand that my insurance will be billed for this visit.   I have read or had this consent read to me.  I understand the contents of this consent, which adequately explains the benefits and risks of the Services being provided via telemedicine.   I have been provided ample opportunity to ask questions regarding this consent and the Services and have had my questions  answered to my satisfaction.  I give my informed consent for the services to be provided through the use of telemedicine in my medical care  By participating in this telemedicine visit I agree to the above.

## 2019-04-01 ENCOUNTER — Encounter: Payer: Self-pay | Admitting: Internal Medicine

## 2019-04-01 ENCOUNTER — Telehealth (INDEPENDENT_AMBULATORY_CARE_PROVIDER_SITE_OTHER): Payer: Medicare Other | Admitting: Internal Medicine

## 2019-04-01 VITALS — BP 136/55 | HR 60 | Ht 66.0 in | Wt 231.5 lb

## 2019-04-01 DIAGNOSIS — I441 Atrioventricular block, second degree: Secondary | ICD-10-CM

## 2019-04-01 DIAGNOSIS — G4733 Obstructive sleep apnea (adult) (pediatric): Secondary | ICD-10-CM

## 2019-04-01 DIAGNOSIS — I10 Essential (primary) hypertension: Secondary | ICD-10-CM

## 2019-04-01 DIAGNOSIS — I480 Paroxysmal atrial fibrillation: Secondary | ICD-10-CM

## 2019-04-01 NOTE — Progress Notes (Signed)
Electrophysiology TeleHealth Note  Due to national recommendations of social distancing due to [place] 19, an audio telehealth visit is felt to be most appropriate for this patient at this time.  Verbal consent was obtained by me for the telehealth visit today.  The patient does not have capability for a virtual visit.  A phone visit is therefore required today.   Date:  04/01/2019   ID:  Alicia, [name] Sep 15, 1945, MRN [id number]  Location: patient's home  Provider location:  [name]  Evaluation Performed: Follow-up visit  PCP:  Caryl Bis, MD   Electrophysiologist:  Dr [name]  Chief Complaint:  palpitations  History of Present Illness:    Alicia [name] is a 73 y.o. female who presents via telehealth conferencing today.  Since last being seen in our clinic, the patient reports doing very well.  No afib.  Today, she denies symptoms of palpitations, chest pain, shortness of breath,  lower extremity edema, dizziness, presyncope, or syncope.  The patient is otherwise without complaint today.  The patient denies symptoms of fevers, chills, cough, or new SOB worrisome for COVID 19.  Past Medical History:  Diagnosis Date  . Anemia    hx of  . Anxiety   . Cancer Alicia [name])    endometrial cancer/ skin cancers removed nose non melanoma  . Diabetes ([place])    Type 2  . DJD (degenerative joint disease)   . Dysrhythmia    SYMPTOMATIC BRADYCARDIA  / Afib  . Family history of breast cancer   . Family history of cancer   . Gallstones   . History of kidney stones    Hx of   . History of radiation therapy 04/04/17-04/25/17   vaginal cuff 30 Gy in 5 fractions  . Hypertension    HCTZ  . Hypothyroidism   . Obesity   . 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 Sleep apnea    No machine Cant tolerate  . 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  . Wears glasses     Past Surgical History:  Procedure Laterality Date  . ANKLE SURGERY Right    multiple  . BREAST SURGERY      biopsy L breast benign  . CHOLECYSTECTOMY    . COLONOSCOPY    . EYE SURGERY     both cataracts  . FOOT ARTHROTOMY     left  . FOOT SURGERY     Bil  bone spurs /Left  foot neuroma removed Right foot sinus tarside surgery Right bone in toe removed  . KNEE SURGERY     right  . 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 PERMANENT PACEMAKER INSERTION N/A 12/15/2012   SJM 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 by Dr Lovena Le for transient AV block  . ROBOTIC ASSISTED TOTAL HYSTERECTOMY WITH BILATERAL SALPINGO OOPHERECTOMY N/A 02/19/2017   Procedure: XI ROBOTIC ASSISTED TOTAL HYSTERECTOMY WITH BILATERAL SALPINGO OOPHORECTOMY;  Surgeon: Everitt Amber, MD;  Location: [facility];  Service: Gynecology;  Laterality: N/A;  . SENTINEL NODE BIOPSY N/A 02/19/2017   Procedure: SENTINEL NODE BIOPSY;  Surgeon: Everitt Amber, MD;  Location: [facility];  Service: Gynecology;  Laterality: N/A;  . SHOULDER ARTHROSCOPY WITH SUBACROMIAL DECOMPRESSION AND OPEN ROTATOR C Right 07/07/2013   Procedure: RIGHT SHOULDER ARTHROSCOPY WITH SUBACROMIAL DECOMPRESSION AND ROTATOR CUFF DEBRIDEMENT/ DISTAL CLAVICLE RESECTION;  Surgeon: [name],  MD;  Location: [facility];  Service: Orthopedics;  Laterality: Right;  . SHOULDER SURGERY Right 2012    Current Outpatient Medications  Medication Sig Dispense Refill  . apixaban (ELIQUIS) 5 MG TABS tablet Take 1 tablet (5 mg total) by mouth 2 (two) times daily. PT NEEDS APPT, CALL [phone] TO SCHEDULE, 1ST ATTEMPT 60 tablet 0  . citalopram (CELEXA) 20 MG tablet Take 10 mg daily by mouth.     . co-enzyme Q-10 30 MG capsule Take 30 mg by mouth daily.    [name] diltiazem (CARDIZEM) 30 MG tablet Take 30 mg by mouth See admin instructions. Pt to take as needed if going into AFIB    . flecainide (TAMBOCOR) 100 MG tablet Take 300 mg by mouth as needed. TAKE 3 TABLETS (300 MG) ONCE AS NEEDED    .  hydrochlorothiazide (HYDRODIURIL) 25 MG tablet Take 25 mg by mouth daily.     . insulin NPH-regular Human (NOVOLIN 70/30) (70-30) 100 UNIT/ML injection Inject 47 Units into the skin 2 (two) times daily with a meal. Pt takes 48 units every morning, and 48 units every evening    . INSULIN SYRINGE 1CC/29G 29G X 1/2" 1 ML MISC     . levothyroxine (SYNTHROID, LEVOTHROID) 112 MCG tablet TAKE 1 TABLET ONCE A DAY BEFORE BREAKFAST. 30 tablet 11  . Misc Natural Products (ESSIAC TONIC) CAPS Take 2 capsules by mouth 2 (two) times daily.    . Multiple Vitamins-Minerals (ICAPS) CAPS Take 1 capsule by mouth daily.     [name] (SYSTANE) 0.4-0.3 % SOLN Place 1 drop into both eyes daily as needed (dry eyes).     . Probiotic Product (PROBIOTIC DAILY PO) Take 1 tablet by mouth daily.     No current facility-administered medications for this visit.     Allergies:   Ace inhibitors, Cardizem [diltiazem hcl], Oxycodone, Statins, Sulfa antibiotics, Tape, Cortisone, Latex, and Tartrazine   Social History:  The patient  reports that she has never smoked. She has never used smokeless tobacco. She reports that she does not drink alcohol or use drugs.   Family History:  The patient's family history includes Breast cancer in her cousin; Cancer in her cousin, cousin, father, maternal aunt, maternal aunt, maternal uncle, and maternal uncle; Cancer (age of onset: 8) in her paternal aunt; Heart attack (age of onset: 40) in her maternal uncle; Heart disease in her maternal grandmother; Other (age of onset: 36) in an other family member; Stroke in her mother; Sudden death in her brother; Throat cancer in her maternal uncle; Tongue cancer in her father.   ROS:  Please see the history of present illness.   All other systems are personally reviewed and negative.    Exam:    Vital Signs:  BP (!) 136/55   Pulse 60   Ht 5\' 6"  (1.676 m)   Wt 231 lb 8 oz (105 kg)   BMI 37.37 kg/m   Well sounding, alert  and conversant   Labs/Other Tests and Data Reviewed:    Recent Labs: No results found for requested labs within last 8760 hours.   Wt Readings from Last 3 Encounters:  04/01/19 231 lb 8 oz (105 kg)  12/11/18 233 lb 6 oz (105.9 kg)  04/07/18 239 lb 6.4 oz (108.6 kg)     Last device remote is reviewed from [facility] which reveals normal device function, no arrhythmias    ASSESSMENT & PLAN:    1.  Second  degree AV block Normal pacemaker function Not dependant  2. Paroxysmal atrial fibrillation No afib by device interrogation On eliquis for chads2vasc score of 4  3. HTN Stable No change required today  4. Overweight Lifestyle modification encouraged  5. OSA Not using CPAP  Follow-up:  12 months   Patient Risk:  after full review of this patients clinical status, I feel that they are at moderate risk at this time.  Today, I have spent 15 minutes with the patient with telehealth technology discussing arrhythmia management .    Army Fossa, MD  04/01/2019 11:06 AM     [facility] [address] [place] [address] [phone] (office) [phone] (fax)

## 2019-04-06 DIAGNOSIS — E1165 Type 2 diabetes mellitus with hyperglycemia: Secondary | ICD-10-CM | POA: Diagnosis not present

## 2019-04-06 DIAGNOSIS — E039 Hypothyroidism, unspecified: Secondary | ICD-10-CM | POA: Diagnosis not present

## 2019-04-22 DIAGNOSIS — L03031 Cellulitis of right toe: Secondary | ICD-10-CM | POA: Diagnosis not present

## 2019-04-22 DIAGNOSIS — L600 Ingrowing nail: Secondary | ICD-10-CM | POA: Diagnosis not present

## 2019-04-22 DIAGNOSIS — M79671 Pain in right foot: Secondary | ICD-10-CM | POA: Diagnosis not present

## 2019-04-22 DIAGNOSIS — M79674 Pain in right toe(s): Secondary | ICD-10-CM | POA: Diagnosis not present

## 2019-05-11 ENCOUNTER — Ambulatory Visit (INDEPENDENT_AMBULATORY_CARE_PROVIDER_SITE_OTHER): Payer: Medicare Other | Admitting: *Deleted

## 2019-05-11 DIAGNOSIS — I441 Atrioventricular block, second degree: Secondary | ICD-10-CM | POA: Diagnosis not present

## 2019-05-11 LAB — CUP PACEART REMOTE DEVICE CHECK
Date Time Interrogation Session: 20210104065156
Implantable Lead Implant Date: 20140811
Implantable Lead Implant Date: 20140811
Implantable Lead Location: 753859
Implantable Lead Location: 753860
Implantable Pulse Generator Implant Date: 20140811
Pulse Gen Model: 2240
Pulse Gen Serial Number: 7528878

## 2019-05-12 DIAGNOSIS — E1122 Type 2 diabetes mellitus with diabetic chronic kidney disease: Secondary | ICD-10-CM | POA: Diagnosis not present

## 2019-05-12 DIAGNOSIS — I4891 Unspecified atrial fibrillation: Secondary | ICD-10-CM | POA: Diagnosis not present

## 2019-05-12 DIAGNOSIS — I10 Essential (primary) hypertension: Secondary | ICD-10-CM | POA: Diagnosis not present

## 2019-05-12 DIAGNOSIS — N189 Chronic kidney disease, unspecified: Secondary | ICD-10-CM | POA: Diagnosis not present

## 2019-05-12 DIAGNOSIS — E1165 Type 2 diabetes mellitus with hyperglycemia: Secondary | ICD-10-CM | POA: Diagnosis not present

## 2019-05-12 DIAGNOSIS — Z0001 Encounter for general adult medical examination with abnormal findings: Secondary | ICD-10-CM | POA: Diagnosis not present

## 2019-05-12 DIAGNOSIS — E039 Hypothyroidism, unspecified: Secondary | ICD-10-CM | POA: Diagnosis not present

## 2019-05-12 DIAGNOSIS — E782 Mixed hyperlipidemia: Secondary | ICD-10-CM | POA: Diagnosis not present

## 2019-05-13 DIAGNOSIS — M79675 Pain in left toe(s): Secondary | ICD-10-CM | POA: Diagnosis not present

## 2019-05-13 DIAGNOSIS — E1140 Type 2 diabetes mellitus with diabetic neuropathy, unspecified: Secondary | ICD-10-CM | POA: Diagnosis not present

## 2019-05-13 DIAGNOSIS — E1151 Type 2 diabetes mellitus with diabetic peripheral angiopathy without gangrene: Secondary | ICD-10-CM | POA: Diagnosis not present

## 2019-05-13 DIAGNOSIS — M79674 Pain in right toe(s): Secondary | ICD-10-CM | POA: Diagnosis not present

## 2019-05-13 DIAGNOSIS — M79672 Pain in left foot: Secondary | ICD-10-CM | POA: Diagnosis not present

## 2019-05-13 DIAGNOSIS — L600 Ingrowing nail: Secondary | ICD-10-CM | POA: Diagnosis not present

## 2019-05-13 DIAGNOSIS — M79671 Pain in right foot: Secondary | ICD-10-CM | POA: Diagnosis not present

## 2019-05-21 ENCOUNTER — Telehealth: Payer: Self-pay | Admitting: *Deleted

## 2019-05-21 NOTE — Telephone Encounter (Signed)
Patient called and scheduled a follow up appt for Feb

## 2019-06-02 DIAGNOSIS — G8929 Other chronic pain: Secondary | ICD-10-CM | POA: Diagnosis not present

## 2019-06-02 DIAGNOSIS — M25561 Pain in right knee: Secondary | ICD-10-CM | POA: Diagnosis not present

## 2019-06-02 DIAGNOSIS — M1711 Unilateral primary osteoarthritis, right knee: Secondary | ICD-10-CM | POA: Diagnosis not present

## 2019-06-11 ENCOUNTER — Telehealth: Payer: Self-pay | Admitting: *Deleted

## 2019-06-11 NOTE — Telephone Encounter (Signed)
Patient called and canceled appt for 2/10 rescheduled to mid March

## 2019-06-12 DIAGNOSIS — M1711 Unilateral primary osteoarthritis, right knee: Secondary | ICD-10-CM | POA: Diagnosis not present

## 2019-06-17 ENCOUNTER — Inpatient Hospital Stay: Payer: Medicare Other | Admitting: Gynecologic Oncology

## 2019-06-18 ENCOUNTER — Ambulatory Visit: Payer: Medicare Other | Admitting: Radiation Oncology

## 2019-06-18 DIAGNOSIS — Z23 Encounter for immunization: Secondary | ICD-10-CM | POA: Diagnosis not present

## 2019-07-15 ENCOUNTER — Other Ambulatory Visit: Payer: Self-pay | Admitting: Obstetrics & Gynecology

## 2019-07-17 DIAGNOSIS — Z23 Encounter for immunization: Secondary | ICD-10-CM | POA: Diagnosis not present

## 2019-07-21 ENCOUNTER — Encounter: Payer: Self-pay | Admitting: Gynecologic Oncology

## 2019-07-21 ENCOUNTER — Other Ambulatory Visit: Payer: Self-pay

## 2019-07-21 ENCOUNTER — Inpatient Hospital Stay: Payer: Medicare Other | Attending: Gynecologic Oncology | Admitting: Gynecologic Oncology

## 2019-07-21 VITALS — BP 156/56 | HR 67 | Temp 98.5°F | Resp 16 | Ht 66.0 in | Wt 232.6 lb

## 2019-07-21 DIAGNOSIS — Z923 Personal history of irradiation: Secondary | ICD-10-CM | POA: Diagnosis not present

## 2019-07-21 DIAGNOSIS — Z8349 Family history of other endocrine, nutritional and metabolic diseases: Secondary | ICD-10-CM | POA: Insufficient documentation

## 2019-07-21 DIAGNOSIS — Z803 Family history of malignant neoplasm of breast: Secondary | ICD-10-CM | POA: Diagnosis not present

## 2019-07-21 DIAGNOSIS — Z8542 Personal history of malignant neoplasm of other parts of uterus: Secondary | ICD-10-CM | POA: Diagnosis not present

## 2019-07-21 DIAGNOSIS — E119 Type 2 diabetes mellitus without complications: Secondary | ICD-10-CM | POA: Insufficient documentation

## 2019-07-21 DIAGNOSIS — Z950 Presence of cardiac pacemaker: Secondary | ICD-10-CM | POA: Insufficient documentation

## 2019-07-21 DIAGNOSIS — Z8249 Family history of ischemic heart disease and other diseases of the circulatory system: Secondary | ICD-10-CM | POA: Diagnosis not present

## 2019-07-21 DIAGNOSIS — Z90722 Acquired absence of ovaries, bilateral: Secondary | ICD-10-CM | POA: Insufficient documentation

## 2019-07-21 DIAGNOSIS — I10 Essential (primary) hypertension: Secondary | ICD-10-CM | POA: Insufficient documentation

## 2019-07-21 DIAGNOSIS — E039 Hypothyroidism, unspecified: Secondary | ICD-10-CM | POA: Insufficient documentation

## 2019-07-21 DIAGNOSIS — Z85828 Personal history of other malignant neoplasm of skin: Secondary | ICD-10-CM | POA: Insufficient documentation

## 2019-07-21 DIAGNOSIS — Z794 Long term (current) use of insulin: Secondary | ICD-10-CM | POA: Insufficient documentation

## 2019-07-21 DIAGNOSIS — Z7901 Long term (current) use of anticoagulants: Secondary | ICD-10-CM | POA: Diagnosis not present

## 2019-07-21 DIAGNOSIS — Z9079 Acquired absence of other genital organ(s): Secondary | ICD-10-CM | POA: Insufficient documentation

## 2019-07-21 DIAGNOSIS — Z90710 Acquired absence of both cervix and uterus: Secondary | ICD-10-CM

## 2019-07-21 DIAGNOSIS — Z79899 Other long term (current) drug therapy: Secondary | ICD-10-CM | POA: Diagnosis not present

## 2019-07-21 DIAGNOSIS — C541 Malignant neoplasm of endometrium: Secondary | ICD-10-CM

## 2019-07-21 DIAGNOSIS — Z801 Family history of malignant neoplasm of trachea, bronchus and lung: Secondary | ICD-10-CM | POA: Insufficient documentation

## 2019-07-21 NOTE — Progress Notes (Signed)
Gynecologic Oncology Follow-up  Alicia [name] 74 y.o. female  CC:  Chief Complaint  Patient presents with  . 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 MSI high (genetic testing negative). S/p vaginal brachytherapy completed December, 2018.  No evidence of recurrence/complete clinical response.   I recommend she follow-up at 6 monthly intervals until December, 2023 for symptom review, physical examination and pelvic examination. Pap smear is not recommended in routine endometrial cancer surveillance.  HPI: Patient is seen today in consultation at the request of Dr. [name].  Patient is a very pleasant 74 year old gravida 2 para 2 has been menopausal since her mid-40s. She's never took any hormone replacement therapy. She had an episode of bright red bleeding which then turned to brown in July 2018. She states she was seen by her primary care provider who performed a Pap smear. She does not know what those results are. She was then referred to Dr. [name]. She underwent a pelvic ultrasound on 12/17/2016. It revealed the uterus to be 9.8 x 4.9 x 6.1 cm with an anterior subserosal fibroid measuring 4 x 3 x 2.8 cm. The endometrium was 1.59 cm. The adnexa were unremarkable. She subsequently underwent an endometrial biopsy on August 27. She had a benign endocervical polyp. 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 Is for this reason that she is referred to [place] today.  She is otherwise in her usual state of health. She states she is always cold and relates that secondary to her thyroid disease. She states last night she will cup sweating and what she describes is disoriented. She checked her sugar and her blood pressure. She states her blood pressure was okay and checked her sugar which was 56. She then took glucose pills that she had at home and a repair. Her  Accu-Cheks went from 47 then to 73 and she started feeling better. This morning her blood sugar was 96. Her last A1c was 6.3. She states that usually her sugars run from 110 220. This is a first time that she has had to take sugar pills at home. She states she's had one other episode of what she describes as hypoglycemia at a restaurant and she felt better after drinking orange juice. After she wasn't sure that was related to her atrial fibrillation she felt a little bit nauseated and occasionally with her atrial fibrillation she will. Slightly nauseated.  She denies any chest pain or shortness of breath. She had a pacemaker placed about 6 months after that began experiencing a defibrillation. She's not sure when her last A. fib episode was. She's never had a cardiac catheterization. Her cardiologist is Dr. [name] and she saw him in the summer. She does endorse some lower extremity edema. She is on hydrochlorothiazide but she last took it on Saturday she needs to get her prescription refilled. Her last mammogram was 3 years ago. She has not had once she had a pacemaker placed for fear that it would hurt. Her last colonoscopy was in 2003.  Her father's health is notable for head and neck cancer he was a heavy smoker and drink. He certainly died at the age of 83 from a tongue cancer. Her mother died of a stroke. All of her mother's siblings except one died of cancer. This includes men and women. She does not know the types. Preop creatinine was very elevated at 2.5, however re-check  was normal at 1.3.  On 10/16 she underwent robotic hysterectomy, BSO, SLN biopsy and left pelvic lymphadenectomy. Final pathology revealed a grade 2 tumor, 3.6cm with 0.2 of 2.2cm myo invasion, no LVSI and no cervical or nodal involvement. MSI unstable tumor on IHC. GENETIC TEST RESULTS: Genetic testing performed through [facility] panel reported out on 06/14/2017 showed no pathogenic variants. Germline  Genes Analyzed: MLH1, MSH2, MSH6, PMS2, APC, ATM, BARD1, BMPR1A, BRIP1, CDH1, CDKN2A, CHEK2, DICER1, MRE11A, MUTYH, NBN, PALB2, PTEN, RAD50, RAD51C, RAD51D, SMAD4, STK11, TP53, CDK4, NF1, BRCA1, BRCA2, POLD1, POLE, SMARCA4, HOXB13 (sequencing and deletion/duplication); EPCAM, GREM1 (deletion/duplication only). 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   She was determined to have high/intermediate risk factors and therefore adjuvant radiation as prescribed.   She completed adjuvant radiation between 04/04/17 - 04/25/17 receiving 30Gy in 5 fractions at 6 Gy.  Interval Hx:  She tolerated treatment well and has had no symptoms concerning for recurrence.   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 She is doing well with no complaints or concerning symptoms of recurrence.  Review of Systems: Constitutional: Feels cold all the time, this is no change Skin: No rash Cardiovascular: No chest pain, shortness of breath, + LE edema as she has not taken her Lasix and Saturday Pulmonary: No cough or wheeze.  Gastro Intestinal:  No nausea, vomiting, constipation, or diarrhea reported. No bright red blood per rectum or change in bowel movement.  Genitourinary: No frequency, urgency, or dysuria.  Musculoskeletal: + Bilateral knee pain. She states that she needs injections per mail in her right knee. She had arthroscopic knee surgery that "did not work" as she took a blood thinner and had a "hemorrhage" into her knee.  Neurologic: No weakness Psychology: No changes  Current Meds:  Outpatient Encounter Medications as of 07/21/2019  Medication Sig  . apixaban (ELIQUIS) 5 MG TABS tablet Take 1 tablet (5 mg total)  by mouth 2 (two) times daily. PT NEEDS APPT, CALL [phone] TO SCHEDULE, 1ST ATTEMPT  . citalopram (CELEXA) 20 MG tablet Take 10 mg daily by mouth.   . co-enzyme Q-10 30 MG capsule Take 30 mg by mouth daily.  [name] diltiazem (CARDIZEM) 30 MG tablet Take 30 mg by mouth See admin instructions. Pt to take as needed if going into AFIB  . flecainide (TAMBOCOR) 100 MG tablet Take 300 mg by mouth as needed. TAKE 3 TABLETS (300 MG) ONCE AS NEEDED  . hydrochlorothiazide (HYDRODIURIL) 25 MG tablet Take 25 mg by mouth daily.   . insulin NPH-regular Human (NOVOLIN 70/30) (70-30) 100 UNIT/ML injection Inject 47 Units into the skin 2 (two) times daily with a meal. Pt takes 48 units every morning, and 48 units every evening  . INSULIN SYRINGE 1CC/29G 29G X 1/2" 1 ML MISC   . levothyroxine (SYNTHROID) 112 MCG tablet TAKE 1 TABLET BY MOUTH ONCE DAILY BEFORE BREAKFAST  . Misc Natural Products (ESSIAC TONIC) CAPS Take 2 capsules by mouth 2 (two) times daily.  . Multiple Vitamins-Minerals (ICAPS) CAPS Take 1 capsule by mouth daily.   [name] (SYSTANE) 0.4-0.3 % SOLN Place 1 drop into both eyes daily as needed (dry eyes).   . Probiotic Product (PROBIOTIC DAILY PO) Take 1 tablet by mouth daily.  No facility-administered encounter medications on file as of 07/21/2019.    Allergy:  Allergies  Allergen Reactions  . Ace Inhibitors Cough  . Cardizem [Diltiazem Hcl] Other (See Comments)    headache  . Oxycodone Nausea Only  . Statins Other (See Comments)    myalgias  . Sulfa Antibiotics Hives and Swelling  . Tape     Paper tape is okay  . Cortisone Rash    flushing  . Latex Rash    Sensitive not allergic  . Tartrazine Rash    Social Hx:   Social History   Socioeconomic History  . Marital status: Married    Spouse name: Not on file  . Number of children: 2  . Years of education: Not on file  . Highest education level: Not on file  Occupational History  . Occupation: retired   Tobacco Use  . Smoking status: Never Smoker  . Smokeless tobacco: Never Used  Substance and Sexual Activity  . Alcohol use: No    Alcohol/week: 0.0 standard drinks  . Drug use: No  . Sexual activity: Not Currently    Birth control/protection: Post-menopausal  Other Topics Concern  . Not on file  Social History Narrative   Retired and lives in [place] with spouse   Social Determinants of Health   Financial Resource Strain:   . Difficulty of Paying Living Expenses:   Food Insecurity:   . Worried About Charity fundraiser in the Last Year:   . Arboriculturist in the Last Year:   Transportation Needs:   . Film/video editor (Medical):   [name] Lack of Transportation (Non-Medical):   Physical Activity:   . Days of Exercise per Week:   . Minutes of Exercise per Session:   Stress:   . Feeling of Stress :   Social Connections:   . Frequency of Communication with Friends and Family:   . Frequency of Social Gatherings with Friends and Family:   . Attends Religious Services:   . Active Member of Clubs or Organizations:   . Attends Archivist Meetings:   [name] Marital Status:   Intimate Partner Violence:   . Fear of Current or Ex-Partner:   . Emotionally Abused:   [name] Physically Abused:   . Sexually Abused:     Past Surgical Hx:  Past Surgical History:  Procedure Laterality Date  . ANKLE SURGERY Right    multiple  . BREAST SURGERY     biopsy L breast benign  . CHOLECYSTECTOMY    . COLONOSCOPY    . EYE SURGERY     both cataracts  . FOOT ARTHROTOMY     left  . FOOT SURGERY     Bil  bone spurs /Left  foot neuroma removed Right foot sinus tarside surgery Right bone in toe removed  . KNEE SURGERY     right  . 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 PERMANENT PACEMAKER INSERTION N/A 12/15/2012   SJM 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 by Dr Lovena Le for transient AV block  . ROBOTIC ASSISTED TOTAL  HYSTERECTOMY WITH BILATERAL SALPINGO OOPHERECTOMY N/A 02/19/2017   Procedure: XI ROBOTIC ASSISTED TOTAL HYSTERECTOMY WITH BILATERAL SALPINGO OOPHORECTOMY;  Surgeon: Everitt Amber, MD;  Location: [facility];  Service: Gynecology;  Laterality: N/A;  . SENTINEL NODE BIOPSY N/A 02/19/2017   Procedure: SENTINEL NODE BIOPSY;  Surgeon: Everitt Amber, MD;  Location: [facility];  Service: Gynecology;  Laterality: N/A;  . SHOULDER ARTHROSCOPY WITH SUBACROMIAL DECOMPRESSION AND OPEN ROTATOR C Right 07/07/2013   Procedure: RIGHT SHOULDER ARTHROSCOPY WITH SUBACROMIAL DECOMPRESSION AND ROTATOR CUFF DEBRIDEMENT/ DISTAL CLAVICLE RESECTION;  Surgeon: [name], MD;  Location: [facility];  Service: Orthopedics;  Laterality: Right;  . SHOULDER SURGERY Right 2012    Past Medical Hx:  Past Medical History:  Diagnosis Date  . Anemia    hx of  . Anxiety   . Cancer [facility])    endometrial cancer/ skin cancers removed nose non melanoma  . Diabetes ([place])    Type 2  . DJD (degenerative joint disease)   . Dysrhythmia    SYMPTOMATIC BRADYCARDIA  / Afib  . Family history of breast cancer   . Family history of cancer   . Gallstones   . History of kidney stones    Hx of   . History of radiation therapy 04/04/17-04/25/17   vaginal cuff 30 Gy in 5 fractions  . Hypertension    HCTZ  . Hypothyroidism   . Obesity   . 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 Sleep apnea    No machine Cant tolerate  . 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  . Wears glasses     Oncology Hx:  Oncology History  Endometrial ca [facility])  12/31/2016 Initial Diagnosis   Endometrial ca [facility])     Family Hx:  Family History  Problem Relation Age of Onset  . Sudden death Brother        age 22s, died while playing raquetball  . Stroke Mother   . Cancer Father        throat  . Tongue cancer Father   . Heart disease Maternal Grandmother   . Cancer Maternal Aunt        type unk,hx smoking  . Cancer Maternal  Uncle        type unk, hx smok  . Cancer Paternal Aunt 50       thinks it was GI/colon- had hole in side and colostomy bag  . Cancer Maternal Aunt        type unk, had a colostomy, hx smoking  . Cancer Maternal Uncle        cancer maybe-?type unk  . Throat cancer Maternal Uncle        hx smoking  . Heart attack Maternal Uncle 50  . Cancer Cousin        type unk- died in 90's/50's  . Cancer Cousin        type unk, died in 62's/50's  . Breast cancer Cousin        x2  . Other Other 25       brain tumor  . Allergic rhinitis Neg Hx   . Angioedema Neg Hx   . Asthma Neg Hx   . Atopy Neg Hx   . Eczema Neg Hx   . Immunodeficiency Neg Hx   . Urticaria Neg Hx     Vitals:  Blood pressure (!) 156/56, pulse 67, temperature 98.5 F (36.9 C), temperature source Temporal, resp. rate 16, height '5\' 6"'  (1.676 m), weight 232 lb 9.6 oz (105.5 kg), SpO2 97 %.  Physical Exam: WD in NAD Neck  Supple NROM, without any enlargements.  Skin  No rash/lesions/breakdown  Psychiatry  Alert and oriented to person, place, and time  Abdomen  Normoactive bowel sounds, abdomen soft, non-tender and obese without evidence  of hernia.  Back No CVA tenderness Genito Urinary  Vulva/vagina: Normal external female genitalia.   No lesions. No discharge or bleeding.  Bladder/urethra:  No lesions or masses, well supported bladder  Vagina: normal vaginal tissues, no lesions/masses/blood  Adnexa: no palpable masses. Rectal  deferred Extremities  No bilateral cyanosis, clubbing or edema.     Thereasa Solo, MD 07/21/2019, 4:09 PM

## 2019-07-21 NOTE — Patient Instructions (Signed)
Please notify Dr Denman George at phone number [phone] if you notice vaginal bleeding, new pelvic or abdominal pains, bloating, feeling full easy, or a change in bladder or bowel function.   Please follow-up with Dr Sondra Come in 6 months.  Please follow-up with Dr Denman George in 12 months.  Please have Dr Clabe Seal staff contact Dr Serita Grit office (at [phone]) in September after your appointment with him to request an appointment with her for March, 2022.

## 2019-08-10 ENCOUNTER — Ambulatory Visit (INDEPENDENT_AMBULATORY_CARE_PROVIDER_SITE_OTHER): Payer: Medicare Other | Admitting: *Deleted

## 2019-08-10 DIAGNOSIS — I441 Atrioventricular block, second degree: Secondary | ICD-10-CM

## 2019-08-10 LAB — CUP PACEART REMOTE DEVICE CHECK
Battery Remaining Longevity: 134 mo
Battery Remaining Percentage: 95.5 %
Battery Voltage: 2.98 V
Brady Statistic AP VP Percent: 1 %
Brady Statistic AP VS Percent: 48 %
Brady Statistic AS VP Percent: 1 %
Brady Statistic AS VS Percent: 51 %
Brady Statistic RA Percent Paced: 48 %
Brady Statistic RV Percent Paced: 1.1 %
Date Time Interrogation Session: 20210405140343
Implantable Lead Implant Date: 20140811
Implantable Lead Implant Date: 20140811
Implantable Lead Location: 753859
Implantable Lead Location: 753860
Implantable Pulse Generator Implant Date: 20140811
Lead Channel Impedance Value: 360 Ohm
Lead Channel Impedance Value: 430 Ohm
Lead Channel Pacing Threshold Amplitude: 0.625 V
Lead Channel Pacing Threshold Amplitude: 0.625 V
Lead Channel Pacing Threshold Pulse Width: 0.4 ms
Lead Channel Pacing Threshold Pulse Width: 0.4 ms
Lead Channel Sensing Intrinsic Amplitude: 12 mV
Lead Channel Sensing Intrinsic Amplitude: 2.9 mV
Lead Channel Setting Pacing Amplitude: 0.875
Lead Channel Setting Pacing Amplitude: 1.625
Lead Channel Setting Pacing Pulse Width: 0.4 ms
Lead Channel Setting Sensing Sensitivity: 2 mV
Pulse Gen Model: 2240
Pulse Gen Serial Number: 7528878

## 2019-08-11 NOTE — Progress Notes (Signed)
PPM Remote  

## 2019-09-29 DIAGNOSIS — E1122 Type 2 diabetes mellitus with diabetic chronic kidney disease: Secondary | ICD-10-CM | POA: Diagnosis not present

## 2019-09-29 DIAGNOSIS — E1165 Type 2 diabetes mellitus with hyperglycemia: Secondary | ICD-10-CM | POA: Diagnosis not present

## 2019-09-29 DIAGNOSIS — E782 Mixed hyperlipidemia: Secondary | ICD-10-CM | POA: Diagnosis not present

## 2019-09-29 DIAGNOSIS — C541 Malignant neoplasm of endometrium: Secondary | ICD-10-CM | POA: Diagnosis not present

## 2019-09-29 DIAGNOSIS — I10 Essential (primary) hypertension: Secondary | ICD-10-CM | POA: Diagnosis not present

## 2019-09-29 DIAGNOSIS — N183 Chronic kidney disease, stage 3 unspecified: Secondary | ICD-10-CM | POA: Diagnosis not present

## 2019-09-29 DIAGNOSIS — E8881 Metabolic syndrome: Secondary | ICD-10-CM | POA: Diagnosis not present

## 2019-09-29 DIAGNOSIS — E039 Hypothyroidism, unspecified: Secondary | ICD-10-CM | POA: Diagnosis not present

## 2019-09-29 DIAGNOSIS — K219 Gastro-esophageal reflux disease without esophagitis: Secondary | ICD-10-CM | POA: Diagnosis not present

## 2019-09-29 DIAGNOSIS — Z9189 Other specified personal risk factors, not elsewhere classified: Secondary | ICD-10-CM | POA: Diagnosis not present

## 2019-10-06 DIAGNOSIS — E782 Mixed hyperlipidemia: Secondary | ICD-10-CM | POA: Diagnosis not present

## 2019-10-06 DIAGNOSIS — Z8739 Personal history of other diseases of the musculoskeletal system and connective tissue: Secondary | ICD-10-CM | POA: Diagnosis not present

## 2019-10-06 DIAGNOSIS — E1122 Type 2 diabetes mellitus with diabetic chronic kidney disease: Secondary | ICD-10-CM | POA: Diagnosis not present

## 2019-10-06 DIAGNOSIS — C541 Malignant neoplasm of endometrium: Secondary | ICD-10-CM | POA: Diagnosis not present

## 2019-10-06 DIAGNOSIS — Z6837 Body mass index (BMI) 37.0-37.9, adult: Secondary | ICD-10-CM | POA: Diagnosis not present

## 2019-10-06 DIAGNOSIS — E039 Hypothyroidism, unspecified: Secondary | ICD-10-CM | POA: Diagnosis not present

## 2019-10-06 DIAGNOSIS — K219 Gastro-esophageal reflux disease without esophagitis: Secondary | ICD-10-CM | POA: Diagnosis not present

## 2019-10-06 DIAGNOSIS — I10 Essential (primary) hypertension: Secondary | ICD-10-CM | POA: Diagnosis not present

## 2019-11-10 ENCOUNTER — Ambulatory Visit: Payer: Medicare Other

## 2019-11-16 ENCOUNTER — Ambulatory Visit (INDEPENDENT_AMBULATORY_CARE_PROVIDER_SITE_OTHER): Payer: Medicare Other | Admitting: *Deleted

## 2019-11-16 DIAGNOSIS — I441 Atrioventricular block, second degree: Secondary | ICD-10-CM | POA: Diagnosis not present

## 2019-11-16 LAB — CUP PACEART REMOTE DEVICE CHECK
Battery Remaining Longevity: 134 mo
Battery Remaining Percentage: 95.5 %
Battery Voltage: 2.98 V
Brady Statistic AP VP Percent: 1 %
Brady Statistic AP VS Percent: 47 %
Brady Statistic AS VP Percent: 1 %
Brady Statistic AS VS Percent: 51 %
Brady Statistic RA Percent Paced: 47 %
Brady Statistic RV Percent Paced: 1.1 %
Date Time Interrogation Session: 20210711144927
Implantable Lead Implant Date: 20140811
Implantable Lead Implant Date: 20140811
Implantable Lead Location: 753859
Implantable Lead Location: 753860
Implantable Pulse Generator Implant Date: 20140811
Lead Channel Impedance Value: 400 Ohm
Lead Channel Impedance Value: 440 Ohm
Lead Channel Pacing Threshold Amplitude: 0.5 V
Lead Channel Pacing Threshold Amplitude: 0.625 V
Lead Channel Pacing Threshold Pulse Width: 0.4 ms
Lead Channel Pacing Threshold Pulse Width: 0.4 ms
Lead Channel Sensing Intrinsic Amplitude: 12 mV
Lead Channel Sensing Intrinsic Amplitude: 3.1 mV
Lead Channel Setting Pacing Amplitude: 0.875
Lead Channel Setting Pacing Amplitude: 1.5 V
Lead Channel Setting Pacing Pulse Width: 0.4 ms
Lead Channel Setting Sensing Sensitivity: 2 mV
Pulse Gen Model: 2240
Pulse Gen Serial Number: 7528878

## 2019-11-24 NOTE — Progress Notes (Signed)
Remote pacemaker transmission.   

## 2020-01-06 ENCOUNTER — Telehealth: Payer: Self-pay | Admitting: *Deleted

## 2020-01-06 NOTE — Telephone Encounter (Signed)
Returned patient's call, rescheduled fu for 01-21-20 due to patient gone that week, rescheduled for 02-08-20, patient agreed to new date and time

## 2020-01-18 ENCOUNTER — Ambulatory Visit: Payer: Medicare Other | Admitting: Radiation Oncology

## 2020-01-21 ENCOUNTER — Ambulatory Visit: Payer: Medicare Other | Admitting: Radiation Oncology

## 2020-02-05 DIAGNOSIS — E039 Hypothyroidism, unspecified: Secondary | ICD-10-CM | POA: Diagnosis not present

## 2020-02-05 DIAGNOSIS — N183 Chronic kidney disease, stage 3 unspecified: Secondary | ICD-10-CM | POA: Diagnosis not present

## 2020-02-05 DIAGNOSIS — K219 Gastro-esophageal reflux disease without esophagitis: Secondary | ICD-10-CM | POA: Diagnosis not present

## 2020-02-05 DIAGNOSIS — I482 Chronic atrial fibrillation, unspecified: Secondary | ICD-10-CM | POA: Diagnosis not present

## 2020-02-05 DIAGNOSIS — R946 Abnormal results of thyroid function studies: Secondary | ICD-10-CM | POA: Diagnosis not present

## 2020-02-05 DIAGNOSIS — E1122 Type 2 diabetes mellitus with diabetic chronic kidney disease: Secondary | ICD-10-CM | POA: Diagnosis not present

## 2020-02-05 DIAGNOSIS — E8881 Metabolic syndrome: Secondary | ICD-10-CM | POA: Diagnosis not present

## 2020-02-05 DIAGNOSIS — E782 Mixed hyperlipidemia: Secondary | ICD-10-CM | POA: Diagnosis not present

## 2020-02-05 DIAGNOSIS — I10 Essential (primary) hypertension: Secondary | ICD-10-CM | POA: Diagnosis not present

## 2020-02-05 DIAGNOSIS — E1165 Type 2 diabetes mellitus with hyperglycemia: Secondary | ICD-10-CM | POA: Diagnosis not present

## 2020-02-07 NOTE — Progress Notes (Signed)
Radiation Oncology         [phone] ________________________________  Name: Alicia [name] MRN: [id number]  Date: 02/08/2020  DOB: 01/01/1946  Follow-Up Visit Note  CC: Caryl Bis, MD  Everitt Amber, MD    ICD-10-CM   1. Endometrial cancer ([place])  C54.1     Diagnosis: Stage I-A grade2endometrioid endometrialcancer  Interval Since Last Radiation: Two years, nine months, and two weeks.  HDR Radiation treatment dates:  04/04/2017, 04/11/2017, 04/18/2017, 04/22/2017, 04/25/2017  Site/dose: The vaginal cuff was treated to 30 Gy in 5 fractions of 6 Gy.  Narrative:  The patient returns today for routine follow-up. She was last seen by Dr. Denman George on 07/21/2019, during which time she was noted to have had a complete clinical response with no evidence of recurrence.  On review of systems, she reports diarrhea after eating.  This occurs approximately every 2 to 3 days and is quite debilitating when it does happen.  Patient reports no associations with certain food intake but is associated with meals.  She has been placed on the medication by her primary care physician to address this issue and she will see him again tomorrow.  We discussed possible consideration for GI consultation if does not improve.  Discussed bulking agents to help with absorption of fluid within the bowels to see if this might possibly help her issues.  She denies pelvic pain, vaginal bleeding, and changes in bladder patterns.  ALLERGIES:  is allergic to ace inhibitors, cardizem [diltiazem hcl], oxycodone, statins, sulfa antibiotics, tape, cortisone, latex, and tartrazine.  Meds: Current Outpatient Medications  Medication Sig Dispense Refill  . apixaban (ELIQUIS) 5 MG TABS tablet Take 1 tablet (5 mg total) by mouth 2 (two) times daily. PT NEEDS APPT, CALL [phone] TO SCHEDULE, 1ST ATTEMPT 60 tablet 0  . citalopram (CELEXA) 20 MG tablet Take 10 mg daily by mouth.     . co-enzyme Q-10 30 MG capsule Take 30 mg  by mouth daily.    [name] diltiazem (CARDIZEM) 30 MG tablet Take 30 mg by mouth See admin instructions. Pt to take as needed if going into AFIB    . flecainide (TAMBOCOR) 100 MG tablet Take 300 mg by mouth as needed. TAKE 3 TABLETS (300 MG) ONCE AS NEEDED    . hydrochlorothiazide (HYDRODIURIL) 25 MG tablet Take 25 mg by mouth daily.     . insulin NPH-regular Human (NOVOLIN 70/30) (70-30) 100 UNIT/ML injection Inject 47 Units into the skin 2 (two) times daily with a meal. Pt takes 48 units every morning, and 48 units every evening    . INSULIN SYRINGE 1CC/29G 29G X 1/2" 1 ML MISC     . levothyroxine (SYNTHROID) 112 MCG tablet TAKE 1 TABLET BY MOUTH ONCE DAILY BEFORE BREAKFAST 30 tablet 11  . Misc Natural Products (ESSIAC TONIC) CAPS Take 2 capsules by mouth 2 (two) times daily.    . Multiple Vitamins-Minerals (ICAPS) CAPS Take 1 capsule by mouth daily.     [name] (SYSTANE) 0.4-0.3 % SOLN Place 1 drop into both eyes daily as needed (dry eyes).     . Probiotic Product (PROBIOTIC DAILY PO) Take 1 tablet by mouth daily.     No current facility-administered medications for this encounter.    Physical Findings: The patient is in no acute distress. Patient is alert and oriented.  height is 5\' 6"  (1.676 m) and weight is 228 lb 8 oz (103.6 kg). Her tympanic temperature is 96.3 F (35.7 C) (  abnormal). Her blood pressure is 157/52 (abnormal) and her pulse is 78. Her respiration is 18 and oxygen saturation is 99%.   Lungs are clear to auscultation bilaterally. Heart has regular rate and rhythm. No palpable cervical, supraclavicular, or axillary adenopathy. Abdomen soft, non-tender, normal bowel sounds.  On pelvic examination the external genitalia were unremarkable. A speculum exam was performed. There are no mucosal lesions noted in the vaginal vault. On bimanual and rectovaginal examination there were no pelvic masses appreciated.  Slight induration noted to the vaginal cuff but no  palpable mass  Lab Findings: Lab Results  Component Value Date   WBC 6.7 11/10/2017   HGB 12.0 11/10/2017   HCT 36.5 11/10/2017   MCV 95.8 11/10/2017   PLT 242 11/10/2017    Radiographic Findings: No results found.  Impression: Stage I-A grade2endometrioid endometrialcancer.   No evidence of recurrence on clinical exam today.  Intermittent diarrhea issues as above  Plan: The patient will follow-up with Dr. Denman George in six months and with radiation oncology in one year.  Total time spent in this encounter was 25 minutes which included reviewing the patient's most recent follow-up with Dr. Denman George, physical examination, and documentation.  ____________________________________  Blair Promise, PhD, MD  This document serves as a record of services personally performed by Gery Pray, MD. It was created on his behalf by Clerance Lav, a trained medical scribe. The creation of this record is based on the scribe's personal observations and the provider's statements to them. This document has been checked and approved by the attending provider.

## 2020-02-08 ENCOUNTER — Ambulatory Visit
Admission: RE | Admit: 2020-02-08 | Discharge: 2020-02-08 | Disposition: A | Discharge status: Home / Self Care | Payer: Medicare Other | Source: Ambulatory Visit | Attending: Radiation Oncology | Admitting: Radiation Oncology

## 2020-02-08 ENCOUNTER — Encounter: Payer: Self-pay | Admitting: Radiation Oncology

## 2020-02-08 ENCOUNTER — Other Ambulatory Visit: Payer: Self-pay

## 2020-02-08 DIAGNOSIS — Z08 Encounter for follow-up examination after completed treatment for malignant neoplasm: Secondary | ICD-10-CM | POA: Diagnosis not present

## 2020-02-08 DIAGNOSIS — C541 Malignant neoplasm of endometrium: Secondary | ICD-10-CM

## 2020-02-08 DIAGNOSIS — Z7901 Long term (current) use of anticoagulants: Secondary | ICD-10-CM | POA: Insufficient documentation

## 2020-02-08 DIAGNOSIS — Z794 Long term (current) use of insulin: Secondary | ICD-10-CM | POA: Insufficient documentation

## 2020-02-08 DIAGNOSIS — Z79899 Other long term (current) drug therapy: Secondary | ICD-10-CM | POA: Insufficient documentation

## 2020-02-08 DIAGNOSIS — R197 Diarrhea, unspecified: Secondary | ICD-10-CM | POA: Diagnosis not present

## 2020-02-08 DIAGNOSIS — Z8542 Personal history of malignant neoplasm of other parts of uterus: Secondary | ICD-10-CM | POA: Diagnosis not present

## 2020-02-08 DIAGNOSIS — Z923 Personal history of irradiation: Secondary | ICD-10-CM | POA: Diagnosis not present

## 2020-02-08 NOTE — Progress Notes (Signed)
Patient here for a 6 month f/u visit with Dr. Sondra Come. Radiation tx was in 2018. Patient denies pain or vaginal bleeding. She denies bladder problems but does have diarrhea after eating.  BP (!) 157/52 (BP Location: Left Arm, Patient Position: Sitting)   Pulse 78   Temp (!) 96.3 F (35.7 C) (Tympanic)   Resp 18   Ht 5\' 6"  (1.676 m)   Wt 228 lb 8 oz (103.6 kg)   SpO2 99%   BMI 36.88 kg/m   Wt Readings from Last 3 Encounters:  02/08/20 228 lb 8 oz (103.6 kg)  07/21/19 232 lb 9.6 oz (105.5 kg)  04/01/19 231 lb 8 oz (105 kg)

## 2020-02-10 ENCOUNTER — Encounter: Payer: Self-pay | Admitting: Internal Medicine

## 2020-02-10 DIAGNOSIS — I10 Essential (primary) hypertension: Secondary | ICD-10-CM | POA: Diagnosis not present

## 2020-02-10 DIAGNOSIS — Z8739 Personal history of other diseases of the musculoskeletal system and connective tissue: Secondary | ICD-10-CM | POA: Diagnosis not present

## 2020-02-10 DIAGNOSIS — E782 Mixed hyperlipidemia: Secondary | ICD-10-CM | POA: Diagnosis not present

## 2020-02-10 DIAGNOSIS — Z0001 Encounter for general adult medical examination with abnormal findings: Secondary | ICD-10-CM | POA: Diagnosis not present

## 2020-02-10 DIAGNOSIS — Z23 Encounter for immunization: Secondary | ICD-10-CM | POA: Diagnosis not present

## 2020-02-10 DIAGNOSIS — C541 Malignant neoplasm of endometrium: Secondary | ICD-10-CM | POA: Diagnosis not present

## 2020-02-10 DIAGNOSIS — E1122 Type 2 diabetes mellitus with diabetic chronic kidney disease: Secondary | ICD-10-CM | POA: Diagnosis not present

## 2020-02-10 DIAGNOSIS — L500 Allergic urticaria: Secondary | ICD-10-CM | POA: Diagnosis not present

## 2020-02-15 ENCOUNTER — Ambulatory Visit (INDEPENDENT_AMBULATORY_CARE_PROVIDER_SITE_OTHER): Payer: Medicare Other

## 2020-02-15 DIAGNOSIS — I441 Atrioventricular block, second degree: Secondary | ICD-10-CM | POA: Diagnosis not present

## 2020-02-16 LAB — CUP PACEART REMOTE DEVICE CHECK
Battery Remaining Longevity: 134 mo
Battery Remaining Percentage: 95.5 %
Battery Voltage: 2.98 V
Brady Statistic AP VP Percent: 1 %
Brady Statistic AP VS Percent: 47 %
Brady Statistic AS VP Percent: 1 %
Brady Statistic AS VS Percent: 52 %
Brady Statistic RA Percent Paced: 47 %
Brady Statistic RV Percent Paced: 1.1 %
Date Time Interrogation Session: 20211012012403
Implantable Lead Implant Date: 20140811
Implantable Lead Implant Date: 20140811
Implantable Lead Location: 753859
Implantable Lead Location: 753860
Implantable Pulse Generator Implant Date: 20140811
Lead Channel Impedance Value: 400 Ohm
Lead Channel Impedance Value: 440 Ohm
Lead Channel Pacing Threshold Amplitude: 0.5 V
Lead Channel Pacing Threshold Amplitude: 0.625 V
Lead Channel Pacing Threshold Pulse Width: 0.4 ms
Lead Channel Pacing Threshold Pulse Width: 0.4 ms
Lead Channel Sensing Intrinsic Amplitude: 12 mV
Lead Channel Sensing Intrinsic Amplitude: 2.9 mV
Lead Channel Setting Pacing Amplitude: 0.875
Lead Channel Setting Pacing Amplitude: 1.5 V
Lead Channel Setting Pacing Pulse Width: 0.4 ms
Lead Channel Setting Sensing Sensitivity: 2 mV
Pulse Gen Model: 2240
Pulse Gen Serial Number: 7528878

## 2020-02-17 NOTE — Progress Notes (Signed)
Remote pacemaker transmission.   

## 2020-04-08 ENCOUNTER — Encounter: Payer: Medicare Other | Admitting: Internal Medicine

## 2020-04-22 ENCOUNTER — Ambulatory Visit (INDEPENDENT_AMBULATORY_CARE_PROVIDER_SITE_OTHER): Payer: Medicare Other | Admitting: Internal Medicine

## 2020-04-22 ENCOUNTER — Encounter: Payer: Self-pay | Admitting: Internal Medicine

## 2020-04-22 VITALS — BP 150/72 | HR 60 | Resp 16 | Ht 66.0 in | Wt 231.0 lb

## 2020-04-22 DIAGNOSIS — I480 Paroxysmal atrial fibrillation: Secondary | ICD-10-CM

## 2020-04-22 DIAGNOSIS — I10 Essential (primary) hypertension: Secondary | ICD-10-CM

## 2020-04-22 DIAGNOSIS — D6869 Other thrombophilia: Secondary | ICD-10-CM

## 2020-04-22 DIAGNOSIS — I441 Atrioventricular block, second degree: Secondary | ICD-10-CM | POA: Diagnosis not present

## 2020-04-22 MED ORDER — APIXABAN 5 MG PO TABS: 5.0000 mg | ORAL_TABLET | Freq: Two times a day (BID) | ORAL | 11 refills | Status: DC

## 2020-04-22 NOTE — Patient Instructions (Addendum)
Medication Instructions:   Your physician recommends that you continue on your current medications as directed. Please refer to the Current Medication list given to you today.  Labwork:  None  Testing/Procedures:  None  Follow-Up:  Your physician recommends that you schedule a follow-up appointment in: 1 year.  Any Other Special Instructions Will Be Listed Below (If Applicable).  If you need a refill on your cardiac medications before your next appointment, please call your pharmacy. 

## 2020-04-22 NOTE — Progress Notes (Signed)
PCP: Caryl Bis, MD   Primary EP:  Dr Aurea Graff Alicia [name] is a 74 y.o. female who presents today for routine electrophysiology followup.  Since last being seen in our clinic, the patient reports doing very well.  Today, she denies symptoms of palpitations, chest pain, shortness of breath,  lower extremity edema, dizziness, presyncope, or syncope.  The patient is otherwise without complaint today.   Past Medical History:  Diagnosis Date   Anemia    hx of   Anxiety    Cancer ([place])    endometrial cancer/ skin cancers removed nose non melanoma   Diabetes ([place])    Type 2   DJD (degenerative joint disease)    Dysrhythmia    SYMPTOMATIC BRADYCARDIA  / Afib   Family history of breast cancer    Family history of cancer    Gallstones    History of kidney stones    Hx of    History of radiation therapy 04/04/17-04/25/17   vaginal cuff 30 Gy in 5 fractions   Hypertension    HCTZ   Hypothyroidism    Obesity    Pacemaker 12/15/2012   DUAL CHAMBER    DR Lovena Le   Sleep apnea    No machine Cant tolerate   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   Wears glasses    Past Surgical History:  Procedure Laterality Date   ANKLE SURGERY Right    multiple   BREAST SURGERY     biopsy L breast benign   CHOLECYSTECTOMY     COLONOSCOPY     EYE SURGERY     both cataracts   FOOT ARTHROTOMY     left   FOOT SURGERY     Bil  bone spurs /Left  foot neuroma removed Right foot sinus tarside surgery Right bone in toe removed   KNEE SURGERY     right   PACEMAKER INSERTION  12/15/2012   SJM 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 by Dr Lovena Le for [facility] 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   PERMANENT PACEMAKER INSERTION N/A 12/15/2012   SJM 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 by Dr Lovena Le for transient AV block   ROBOTIC ASSISTED TOTAL HYSTERECTOMY WITH BILATERAL SALPINGO OOPHERECTOMY N/A 02/19/2017   Procedure: XI ROBOTIC ASSISTED TOTAL HYSTERECTOMY WITH BILATERAL SALPINGO OOPHORECTOMY;   Surgeon: Everitt Amber, MD;  Location: [facility];  Service: Gynecology;  Laterality: N/A;   SENTINEL NODE BIOPSY N/A 02/19/2017   Procedure: SENTINEL NODE BIOPSY;  Surgeon: Everitt Amber, MD;  Location: [facility];  Service: Gynecology;  Laterality: N/A;   SHOULDER ARTHROSCOPY WITH SUBACROMIAL DECOMPRESSION AND OPEN ROTATOR C Right 07/07/2013   Procedure: RIGHT SHOULDER ARTHROSCOPY WITH SUBACROMIAL DECOMPRESSION AND ROTATOR CUFF DEBRIDEMENT/ DISTAL CLAVICLE RESECTION;  Surgeon: [name], MD;  Location: [place];  Service: Orthopedics;  Laterality: Right;   SHOULDER SURGERY Right 2012    ROS- all systems are reviewed and negative except as per HPI above  Current Outpatient Medications  Medication Sig Dispense Refill   apixaban (ELIQUIS) 5 MG TABS tablet Take 1 tablet (5 mg total) by mouth 2 (two) times daily. PT NEEDS APPT, CALL [phone] TO SCHEDULE, 1ST ATTEMPT 60 tablet 0   citalopram (CELEXA) 20 MG tablet Take 10 mg daily by mouth.      co-enzyme Q-10 30 MG capsule Take 30 mg by mouth daily.     diltiazem (CARDIZEM) 30 MG tablet Take 30 mg by mouth See admin instructions. Pt to take as  needed if going into AFIB     flecainide (TAMBOCOR) 100 MG tablet Take 300 mg by mouth as needed. TAKE 3 TABLETS (300 MG) ONCE AS NEEDED     hydrochlorothiazide (HYDRODIURIL) 25 MG tablet Take 25 mg by mouth daily.      insulin NPH-regular Human (NOVOLIN 70/30) (70-30) 100 UNIT/ML injection Inject 47 Units into the skin 2 (two) times daily with a meal. Pt takes 48 units every morning, and 48 units every evening     INSULIN SYRINGE 1CC/29G 29G X 1/2" 1 ML MISC      levothyroxine (SYNTHROID) 112 MCG tablet TAKE 1 TABLET BY MOUTH ONCE DAILY BEFORE BREAKFAST 30 tablet 11   Misc Natural Products (ESSIAC TONIC) CAPS Take 2 capsules by mouth 2 (two) times daily.     Multiple Vitamins-Minerals (ICAPS) CAPS Take 1 capsule by mouth daily.      Polyethyl Glycol-Propyl Glycol 0.4-0.3 %  SOLN Place 1 drop into both eyes daily as needed (dry eyes).      Probiotic Product (PROBIOTIC DAILY PO) Take 1 tablet by mouth daily.     No current facility-administered medications for this visit.    Physical Exam: Vitals:   04/22/20 1009  BP: (!) 150/72  Pulse: 60  Resp: 16  SpO2: 96%  Weight: 231 lb (104.8 kg)  Height: 5\' 6"  (1.676 m)    GEN- The patient is well appearing, alert and oriented x 3 today.   Head- normocephalic, atraumatic Eyes-  Sclera clear, conjunctiva pink Ears- hearing intact Oropharynx- clear Lungs- Clear to ausculation bilaterally, normal work of breathing Chest- pacemaker pocket is well healed Heart- Regular rate and rhythm, no murmurs, rubs or gallops, PMI not laterally displaced GI- soft, NT, ND, + BS Extremities- no clubbing, cyanosis, or edema  Pacemaker interrogation- reviewed in detail today,  See PACEART report  ekg tracing ordered today is personally reviewed and shows atrial paced  Assessment and Plan:  1. Symptomatic second degree heart block Normal pacemaker function See Pace Art report No changes today she is not device dependant today  2. Paroxysmal atrial fibrillation afib burden is <1 % chads2vasc score is 4.  She is on eliquis  She has not had to use her "pill in pocket" flecainide in over 2 years  3. OSA Not using CPAP  4. HTN Stable No change required today  5. Overweight Body mass index is 37.28 kg/m.  Lifestyle modification advised  Risks, benefits and potential toxicities for medications prescribed and/or refilled reviewed with patient today.   Return in a year  Thompson Grayer MD, [facility] 04/22/2020 10:29 AM

## 2020-05-06 DIAGNOSIS — Z6837 Body mass index (BMI) 37.0-37.9, adult: Secondary | ICD-10-CM | POA: Diagnosis not present

## 2020-05-06 DIAGNOSIS — G8929 Other chronic pain: Secondary | ICD-10-CM | POA: Diagnosis not present

## 2020-05-06 DIAGNOSIS — M545 Low back pain, unspecified: Secondary | ICD-10-CM | POA: Diagnosis not present

## 2020-05-23 ENCOUNTER — Ambulatory Visit (INDEPENDENT_AMBULATORY_CARE_PROVIDER_SITE_OTHER): Payer: Medicare Other

## 2020-05-23 DIAGNOSIS — I441 Atrioventricular block, second degree: Secondary | ICD-10-CM

## 2020-05-25 DIAGNOSIS — L600 Ingrowing nail: Secondary | ICD-10-CM | POA: Diagnosis not present

## 2020-05-25 LAB — CUP PACEART REMOTE DEVICE CHECK
Battery Remaining Longevity: 135 mo
Battery Remaining Percentage: 95.5 %
Battery Voltage: 2.98 V
Brady Statistic AP VP Percent: 1 %
Brady Statistic AP VS Percent: 42 %
Brady Statistic AS VP Percent: 1 %
Brady Statistic AS VS Percent: 57 %
Brady Statistic RA Percent Paced: 42 %
Brady Statistic RV Percent Paced: 1.1 %
Date Time Interrogation Session: 20220115211313
Implantable Lead Implant Date: 20140811
Implantable Lead Implant Date: 20140811
Implantable Lead Location: 753859
Implantable Lead Location: 753860
Implantable Pulse Generator Implant Date: 20140811
Lead Channel Impedance Value: 410 Ohm
Lead Channel Impedance Value: 460 Ohm
Lead Channel Pacing Threshold Amplitude: 0.625 V
Lead Channel Pacing Threshold Amplitude: 0.625 V
Lead Channel Pacing Threshold Pulse Width: 0.4 ms
Lead Channel Pacing Threshold Pulse Width: 0.4 ms
Lead Channel Sensing Intrinsic Amplitude: 12 mV
Lead Channel Sensing Intrinsic Amplitude: 3.3 mV
Lead Channel Setting Pacing Amplitude: 0.875
Lead Channel Setting Pacing Amplitude: 1.625
Lead Channel Setting Pacing Pulse Width: 0.4 ms
Lead Channel Setting Sensing Sensitivity: 2 mV
Pulse Gen Model: 2240
Pulse Gen Serial Number: 7528878

## 2020-06-07 NOTE — Progress Notes (Signed)
Remote pacemaker transmission.   

## 2020-06-09 DIAGNOSIS — N183 Chronic kidney disease, stage 3 unspecified: Secondary | ICD-10-CM | POA: Diagnosis not present

## 2020-06-09 DIAGNOSIS — K219 Gastro-esophageal reflux disease without esophagitis: Secondary | ICD-10-CM | POA: Diagnosis not present

## 2020-06-09 DIAGNOSIS — E039 Hypothyroidism, unspecified: Secondary | ICD-10-CM | POA: Diagnosis not present

## 2020-06-09 DIAGNOSIS — E1122 Type 2 diabetes mellitus with diabetic chronic kidney disease: Secondary | ICD-10-CM | POA: Diagnosis not present

## 2020-06-09 DIAGNOSIS — E782 Mixed hyperlipidemia: Secondary | ICD-10-CM | POA: Diagnosis not present

## 2020-06-09 DIAGNOSIS — E7849 Other hyperlipidemia: Secondary | ICD-10-CM | POA: Diagnosis not present

## 2020-06-09 DIAGNOSIS — E1165 Type 2 diabetes mellitus with hyperglycemia: Secondary | ICD-10-CM | POA: Diagnosis not present

## 2020-06-09 DIAGNOSIS — E8881 Metabolic syndrome: Secondary | ICD-10-CM | POA: Diagnosis not present

## 2020-06-09 DIAGNOSIS — I10 Essential (primary) hypertension: Secondary | ICD-10-CM | POA: Diagnosis not present

## 2020-06-15 DIAGNOSIS — Z1389 Encounter for screening for other disorder: Secondary | ICD-10-CM | POA: Diagnosis not present

## 2020-06-15 DIAGNOSIS — E782 Mixed hyperlipidemia: Secondary | ICD-10-CM | POA: Diagnosis not present

## 2020-06-15 DIAGNOSIS — E1122 Type 2 diabetes mellitus with diabetic chronic kidney disease: Secondary | ICD-10-CM | POA: Diagnosis not present

## 2020-06-15 DIAGNOSIS — E7849 Other hyperlipidemia: Secondary | ICD-10-CM | POA: Diagnosis not present

## 2020-06-15 DIAGNOSIS — Z1212 Encounter for screening for malignant neoplasm of rectum: Secondary | ICD-10-CM | POA: Diagnosis not present

## 2020-06-15 DIAGNOSIS — Z9189 Other specified personal risk factors, not elsewhere classified: Secondary | ICD-10-CM | POA: Diagnosis not present

## 2020-06-15 DIAGNOSIS — E039 Hypothyroidism, unspecified: Secondary | ICD-10-CM | POA: Diagnosis not present

## 2020-06-15 DIAGNOSIS — N1831 Chronic kidney disease, stage 3a: Secondary | ICD-10-CM | POA: Diagnosis not present

## 2020-06-15 DIAGNOSIS — Z1331 Encounter for screening for depression: Secondary | ICD-10-CM | POA: Diagnosis not present

## 2020-06-15 DIAGNOSIS — L500 Allergic urticaria: Secondary | ICD-10-CM | POA: Diagnosis not present

## 2020-06-15 DIAGNOSIS — I10 Essential (primary) hypertension: Secondary | ICD-10-CM | POA: Diagnosis not present

## 2020-06-15 DIAGNOSIS — C541 Malignant neoplasm of endometrium: Secondary | ICD-10-CM | POA: Diagnosis not present

## 2020-06-15 DIAGNOSIS — Z8739 Personal history of other diseases of the musculoskeletal system and connective tissue: Secondary | ICD-10-CM | POA: Diagnosis not present

## 2020-06-15 DIAGNOSIS — G4733 Obstructive sleep apnea (adult) (pediatric): Secondary | ICD-10-CM | POA: Diagnosis not present

## 2020-07-21 DIAGNOSIS — M5459 Other low back pain: Secondary | ICD-10-CM | POA: Diagnosis not present

## 2020-07-21 DIAGNOSIS — M79675 Pain in left toe(s): Secondary | ICD-10-CM | POA: Diagnosis not present

## 2020-07-22 ENCOUNTER — Other Ambulatory Visit: Payer: Self-pay | Admitting: Obstetrics & Gynecology

## 2020-07-26 ENCOUNTER — Telehealth: Payer: Self-pay | Admitting: *Deleted

## 2020-07-26 DIAGNOSIS — H6123 Impacted cerumen, bilateral: Secondary | ICD-10-CM | POA: Diagnosis not present

## 2020-07-26 DIAGNOSIS — H9201 Otalgia, right ear: Secondary | ICD-10-CM | POA: Diagnosis not present

## 2020-07-26 DIAGNOSIS — Z6836 Body mass index (BMI) 36.0-36.9, adult: Secondary | ICD-10-CM | POA: Diagnosis not present

## 2020-07-26 NOTE — Telephone Encounter (Signed)
CALLED PATIENT TO INFORM OF FU WITH DR. Denman George ON 08-15-20- ARRIVAL TIME- 1:45 PM, SPOKE WITH PATIENT AND SHE IS AWARE OF THIS APPT.

## 2020-07-26 NOTE — Telephone Encounter (Signed)
Enid Derry from radiation called and scheduled the patient for a follow up appt in April with Dr Denman George. Enid Derry will contact the patient

## 2020-07-29 ENCOUNTER — Telehealth: Payer: Self-pay | Admitting: *Deleted

## 2020-07-29 NOTE — Telephone Encounter (Signed)
Returned the patient's call and rescheduled her appt for 4/11 to 4/26

## 2020-08-15 ENCOUNTER — Ambulatory Visit: Payer: Medicare Other | Admitting: Gynecologic Oncology

## 2020-08-16 DIAGNOSIS — M5459 Other low back pain: Secondary | ICD-10-CM | POA: Diagnosis not present

## 2020-08-22 ENCOUNTER — Ambulatory Visit (INDEPENDENT_AMBULATORY_CARE_PROVIDER_SITE_OTHER): Payer: Medicare Other

## 2020-08-22 DIAGNOSIS — I441 Atrioventricular block, second degree: Secondary | ICD-10-CM | POA: Diagnosis not present

## 2020-08-24 ENCOUNTER — Encounter: Payer: Self-pay | Admitting: Gynecologic Oncology

## 2020-08-24 LAB — CUP PACEART REMOTE DEVICE CHECK
Battery Remaining Longevity: 127 mo
Battery Remaining Percentage: 95.5 %
Battery Voltage: 2.96 V
Brady Statistic AP VP Percent: 1 %
Brady Statistic AP VS Percent: 46 %
Brady Statistic AS VP Percent: 1 %
Brady Statistic AS VS Percent: 53 %
Brady Statistic RA Percent Paced: 46 %
Brady Statistic RV Percent Paced: 1.1 %
Date Time Interrogation Session: 20220418020013
Implantable Lead Implant Date: 20140811
Implantable Lead Implant Date: 20140811
Implantable Lead Location: 753859
Implantable Lead Location: 753860
Implantable Pulse Generator Implant Date: 20140811
Lead Channel Impedance Value: 400 Ohm
Lead Channel Impedance Value: 440 Ohm
Lead Channel Pacing Threshold Amplitude: 0.625 V
Lead Channel Pacing Threshold Amplitude: 0.75 V
Lead Channel Pacing Threshold Pulse Width: 0.4 ms
Lead Channel Pacing Threshold Pulse Width: 0.4 ms
Lead Channel Sensing Intrinsic Amplitude: 12 mV
Lead Channel Sensing Intrinsic Amplitude: 3.2 mV
Lead Channel Setting Pacing Amplitude: 1 V
Lead Channel Setting Pacing Amplitude: 1.625
Lead Channel Setting Pacing Pulse Width: 0.4 ms
Lead Channel Setting Sensing Sensitivity: 2 mV
Pulse Gen Model: 2240
Pulse Gen Serial Number: 7528878

## 2020-08-30 ENCOUNTER — Inpatient Hospital Stay (HOSPITAL_BASED_OUTPATIENT_CLINIC_OR_DEPARTMENT_OTHER): Payer: Medicare Other | Admitting: Gynecologic Oncology

## 2020-08-30 ENCOUNTER — Other Ambulatory Visit: Payer: Self-pay

## 2020-08-30 ENCOUNTER — Inpatient Hospital Stay: Payer: Medicare Other | Attending: Gynecologic Oncology

## 2020-08-30 VITALS — BP 167/65 | HR 74 | Temp 97.9°F | Resp 16 | Ht 66.0 in | Wt 238.0 lb

## 2020-08-30 DIAGNOSIS — Z950 Presence of cardiac pacemaker: Secondary | ICD-10-CM | POA: Insufficient documentation

## 2020-08-30 DIAGNOSIS — Z8542 Personal history of malignant neoplasm of other parts of uterus: Secondary | ICD-10-CM | POA: Diagnosis not present

## 2020-08-30 DIAGNOSIS — R600 Localized edema: Secondary | ICD-10-CM | POA: Insufficient documentation

## 2020-08-30 DIAGNOSIS — Z7901 Long term (current) use of anticoagulants: Secondary | ICD-10-CM | POA: Diagnosis not present

## 2020-08-30 DIAGNOSIS — R109 Unspecified abdominal pain: Secondary | ICD-10-CM | POA: Diagnosis not present

## 2020-08-30 DIAGNOSIS — Z803 Family history of malignant neoplasm of breast: Secondary | ICD-10-CM | POA: Insufficient documentation

## 2020-08-30 DIAGNOSIS — Z808 Family history of malignant neoplasm of other organs or systems: Secondary | ICD-10-CM | POA: Insufficient documentation

## 2020-08-30 DIAGNOSIS — Z90710 Acquired absence of both cervix and uterus: Secondary | ICD-10-CM | POA: Insufficient documentation

## 2020-08-30 DIAGNOSIS — Z794 Long term (current) use of insulin: Secondary | ICD-10-CM | POA: Diagnosis not present

## 2020-08-30 DIAGNOSIS — I4891 Unspecified atrial fibrillation: Secondary | ICD-10-CM | POA: Insufficient documentation

## 2020-08-30 DIAGNOSIS — C541 Malignant neoplasm of endometrium: Secondary | ICD-10-CM

## 2020-08-30 DIAGNOSIS — Z79899 Other long term (current) drug therapy: Secondary | ICD-10-CM | POA: Diagnosis not present

## 2020-08-30 DIAGNOSIS — Z90722 Acquired absence of ovaries, bilateral: Secondary | ICD-10-CM | POA: Diagnosis not present

## 2020-08-30 DIAGNOSIS — Z923 Personal history of irradiation: Secondary | ICD-10-CM | POA: Insufficient documentation

## 2020-08-30 LAB — BASIC METABOLIC PANEL
Anion gap: 10 (ref 5–15)
BUN: 19 mg/dL (ref 8–23)
CO2: 30 mmol/L (ref 22–32)
Calcium: 9.3 mg/dL (ref 8.9–10.3)
Chloride: 101 mmol/L (ref 98–111)
Creatinine, Ser: 1.36 mg/dL — ABNORMAL HIGH (ref 0.44–1.00)
GFR, Estimated: 40 mL/min — ABNORMAL LOW (ref >60)
Glucose, Bld: 149 mg/dL — ABNORMAL HIGH (ref 70–99)
Potassium: 3.8 mmol/L (ref 3.5–5.1)
Sodium: 141 mmol/L (ref 135–145)

## 2020-08-30 NOTE — Patient Instructions (Signed)
Please notify Dr Denman George at phone number [phone] if you notice vaginal bleeding, new pelvic or abdominal pains, bloating, feeling full easy, or a change in bladder or bowel function.   Dr Denman George is ordering a CT scan to ensure that the back pain is not caused by an organ in the abdomen.   Please have Dr Clabe Seal office contact Dr Serita Grit office (at [phone]) in [place] after your appointment with him to request an appointment with Dr Denman George for April, 2023.

## 2020-08-30 NOTE — Progress Notes (Signed)
Gynecologic Oncology Follow-up  Alicia [name] 75 y.o. female  CC:  Chief Complaint  Patient presents with  . 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 MSI high (genetic testing negative). S/p vaginal brachytherapy completed December, 2018.  No evidence of recurrence/complete clinical response.   She has some left flank pain that is intermittent. Back xrays negative. - will check CT abd/pelvis to ensure pain is not referred from visceral source.   I recommend she follow-up at 6 monthly intervals until December, 2023 for symptom review, physical examination and pelvic examination. Pap smear is not recommended in routine endometrial cancer surveillance.  HPI: Patient is seen today in consultation at the request of Dr. [name].  Patient is a very pleasant 75 year old gravida 2 para 2 has been menopausal since her mid-40s. She's never took any hormone replacement therapy. She had an episode of bright red bleeding which then turned to brown in July 2018. She states she was seen by her primary care provider who performed a Pap smear. She does not know what those results are. She was then referred to Dr. [name]. She underwent a pelvic ultrasound on 12/17/2016. It revealed the uterus to be 9.8 x 4.9 x 6.1 cm with an anterior subserosal fibroid measuring 4 x 3 x 2.8 cm. The endometrium was 1.59 cm. The adnexa were unremarkable. She subsequently underwent an endometrial biopsy on August 27. She had a benign endocervical polyp. 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 Is for this reason that she is referred to [place] today.  She is otherwise in her usual state of health. She states she is always cold and relates that secondary to her thyroid disease. She states last night she will cup sweating and what she describes is disoriented. She checked her sugar and her blood pressure.  She states her blood pressure was okay and checked her sugar which was 56. She then took glucose pills that she had at home and a repair. Her Accu-Cheks went from 66 then to 54 and she started feeling better. This morning her blood sugar was 96. Her last A1c was 6.3. She states that usually her sugars run from 110 220. This is a first time that she has had to take sugar pills at home. She states she's had one other episode of what she describes as hypoglycemia at a restaurant and she felt better after drinking orange juice. After she wasn't sure that was related to her atrial fibrillation she felt a little bit nauseated and occasionally with her atrial fibrillation she will. Slightly nauseated.  She denies any chest pain or shortness of breath. She had a pacemaker placed about 6 months after that began experiencing a defibrillation. She's not sure when her last A. fib episode was. She's never had a cardiac catheterization. Her cardiologist is Dr. [name] and she saw him in the summer. She does endorse some lower extremity edema. She is on hydrochlorothiazide but she last took it on Saturday she needs to get her prescription refilled. Her last mammogram was 3 years ago. She has not had once she had a pacemaker placed for fear that it would hurt. Her last colonoscopy was in 2003.  Her father's health is notable for head and neck cancer he was a heavy smoker and drink. He certainly died at the age of 53 from a tongue cancer. Her mother died of a stroke. All of her mother's siblings except one  died of cancer. This includes men and women. She does not know the types. Preop creatinine was very elevated at 2.5, however re-check was normal at 1.3.  On 10/16 she underwent robotic hysterectomy, BSO, SLN biopsy and left pelvic lymphadenectomy. Final pathology revealed a grade 2 tumor, 3.6cm with 0.2 of 2.2cm myo invasion, no LVSI and no cervical or nodal involvement. MSI unstable tumor on IHC. GENETIC TEST RESULTS:  Genetic testing performed through [facility] panel reported out on 06/14/2017 showed no pathogenic variants. Germline Genes Analyzed: MLH1, MSH2, MSH6, PMS2, APC, ATM, BARD1, BMPR1A, BRIP1, CDH1, CDKN2A, CHEK2, DICER1, MRE11A, MUTYH, NBN, PALB2, PTEN, RAD50, RAD51C, RAD51D, SMAD4, STK11, TP53, CDK4, NF1, BRCA1, BRCA2, POLD1, POLE, SMARCA4, HOXB13 (sequencing and deletion/duplication); EPCAM, GREM1 (deletion/duplication only). 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   She was determined to have high/intermediate risk factors and therefore adjuvant radiation as prescribed.   She completed adjuvant radiation between 04/04/17 - 04/25/17 receiving 30Gy in 5 fractions at 6 Gy.  Interval Hx:  She tolerated treatment well and has had no symptoms concerning for recurrence.   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 She is doing well with no complaints or concerning symptoms of recurrence. However she does have left posterior back/flank pain. Work up with back xrays was negative.   Review of Systems: Constitutional: Feels cold all the time, this is no change Skin: No rash Cardiovascular: No chest pain, shortness of breath, + LE edema as she has not taken her Lasix and Saturday Pulmonary: No cough or wheeze.  Gastro Intestinal:  No nausea, vomiting, constipation, or diarrhea reported. No bright red blood per rectum or change in bowel movement. + left flank pain Genitourinary: No frequency, urgency, or dysuria.  Musculoskeletal: + Bilateral knee pain. She states that she needs injections per mail in her right knee. She had arthroscopic knee surgery that "did not work" as she took a  blood thinner and had a "hemorrhage" into her knee.  Neurologic: No weakness Psychology: No changes  Current Meds:  Outpatient Encounter Medications as of 08/30/2020  Medication Sig  . apixaban (ELIQUIS) 5 MG TABS tablet Take 1 tablet (5 mg total) by mouth 2 (two) times daily.  . citalopram (CELEXA) 10 MG tablet Take 10 mg by mouth every Monday, Wednesday, and Friday.  [name] co-enzyme Q-10 30 MG capsule Take 30 mg by mouth daily.  . famotidine (PEPCID) 20 MG tablet Take 20 mg by mouth daily.  . hydrochlorothiazide (HYDRODIURIL) 25 MG tablet Take 25 mg by mouth daily.   . insulin NPH-regular Human (NOVOLIN 70/30) (70-30) 100 UNIT/ML injection Inject 50 Units into the skin 2 (two) times daily with a meal. Pt takes 48 units every morning, and 48 units every evening  . INSULIN SYRINGE 1CC/29G 29G X 1/2" 1 ML MISC   . levothyroxine (SYNTHROID) 112 MCG tablet TAKE 1 TABLET BY MOUTH ONCE DAILY BEFORE BREAKFAST  . loratadine (CLARITIN) 10 MG tablet Take 10 mg by mouth daily.  . Misc Natural Products (ESSIAC TONIC) CAPS Take 2 capsules by mouth 2 (two) times daily.  . Multiple Vitamins-Minerals (ICAPS) CAPS Take 1 capsule by mouth daily.   [name] 0.4-0.3 % SOLN Place 1 drop into both eyes daily as needed (dry eyes).   [name]  Probiotic Product (PROBIOTIC DAILY PO) Take 1 tablet by mouth daily.  [name] diltiazem (CARDIZEM) 30 MG tablet Take 30 mg by mouth See admin instructions. Pt to take as needed if going into AFIB (Patient not taking: Reported on 08/24/2020)  . flecainide (TAMBOCOR) 100 MG tablet Take 300 mg by mouth as needed. TAKE 3 TABLETS (300 MG) ONCE AS NEEDED (Patient not taking: Reported on 08/24/2020)   No facility-administered encounter medications on file as of 08/30/2020.    Allergy:  Allergies  Allergen Reactions  . Ace Inhibitors Cough  . Cardizem [Diltiazem Hcl] Other (See Comments)    headache  . Oxycodone Nausea Only  . Statins Other (See Comments)    myalgias  .  Sulfa Antibiotics Hives and Swelling  . Tape     Paper tape is okay  . Aspirin Rash  . Cortisone Rash    flushing  . Latex Rash    Sensitive not allergic  . Tartrazine Rash    Social Hx:   Social History   Socioeconomic History  . Marital status: Married    Spouse name: Not on file  . Number of children: 2  . Years of education: Not on file  . Highest education level: Not on file  Occupational History  . Occupation: retired  Tobacco Use  . Smoking status: Never Smoker  . Smokeless tobacco: Never Used  Vaping Use  . Vaping Use: Never used  Substance and Sexual Activity  . Alcohol use: No    Alcohol/week: 0.0 standard drinks  . Drug use: No  . Sexual activity: Not Currently    Birth control/protection: Post-menopausal  Other Topics Concern  . Not on file  Social History Narrative   Retired and lives in [place] with spouse   Social Determinants of Health   Financial Resource Strain: Not on file  Food Insecurity: Not on file  Transportation Needs: Not on file  Physical Activity: Not on file  Stress: Not on file  Social Connections: Not on file  Intimate Partner Violence: Not on file    Past Surgical Hx:  Past Surgical History:  Procedure Laterality Date  . ANKLE SURGERY Right    multiple  . BREAST SURGERY     biopsy L breast benign  . CHOLECYSTECTOMY    . COLONOSCOPY    . EYE SURGERY     both cataracts  . FOOT ARTHROTOMY     left  . FOOT SURGERY     Bil  bone spurs /Left  foot neuroma removed Right foot sinus tarside surgery Right bone in toe removed  . KNEE SURGERY     right  . 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 PERMANENT PACEMAKER INSERTION N/A 12/15/2012   SJM 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 by Dr Lovena Le for transient AV block  . ROBOTIC ASSISTED TOTAL HYSTERECTOMY WITH BILATERAL SALPINGO OOPHERECTOMY N/A 02/19/2017   Procedure: XI ROBOTIC ASSISTED TOTAL HYSTERECTOMY WITH  BILATERAL SALPINGO OOPHORECTOMY;  Surgeon: Everitt Amber, MD;  [facility];  Service: Gynecology;  Laterality: N/A;  . SENTINEL NODE BIOPSY N/A 02/19/2017   Procedure: SENTINEL NODE BIOPSY;  Surgeon: Everitt Amber, MD;  [facility];  Service: Gynecology;  Laterality: N/A;  . SHOULDER ARTHROSCOPY WITH SUBACROMIAL DECOMPRESSION AND OPEN ROTATOR C Right 07/07/2013   Procedure: RIGHT SHOULDER ARTHROSCOPY WITH SUBACROMIAL DECOMPRESSION AND ROTATOR CUFF DEBRIDEMENT/ DISTAL CLAVICLE RESECTION;  Surgeon: [name], MD;  Location: [place];  Service: Orthopedics;  Laterality: Right;  . SHOULDER SURGERY Right 2012    Past Medical Hx:  Past Medical History:  Diagnosis Date  . Anemia    hx of  . Anxiety   . Cancer [facility])    endometrial cancer/ skin cancers removed nose non melanoma  . Diabetes ([place])    Type 2  . DJD (degenerative joint disease)   . Dysrhythmia    SYMPTOMATIC BRADYCARDIA  / Afib  . Family history of breast cancer   . Family history of cancer   . Gallstones   . History of kidney stones    Hx of   . History of radiation therapy 04/04/17-04/25/17   vaginal cuff 30 Gy in 5 fractions  . Hypertension    HCTZ  . Hypothyroidism   . Obesity   . 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 Sleep apnea    No machine Cant tolerate  . 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  . Wears glasses     Oncology Hx:  Oncology History  Endometrial ca [facility])  12/31/2016 Initial Diagnosis   Endometrial ca [facility])     Family Hx:  Family History  Problem Relation Age of Onset  . Sudden death Brother        age 84s, died while playing raquetball  . Stroke Mother   . Cancer Father        throat  . Tongue cancer Father   . Heart disease Maternal Grandmother   . Cancer Maternal Aunt        type unk,hx smoking  . Cancer Maternal Uncle        type unk, hx smok  . Cancer Paternal Aunt 50       thinks it was GI/colon- had hole in side and colostomy bag   . Cancer Maternal Aunt        type unk, had a colostomy, hx smoking  . Cancer Maternal Uncle        cancer maybe-?type unk  . Throat cancer Maternal Uncle        hx smoking  . Heart attack Maternal Uncle 50  . Cancer Cousin        type unk- died in 40's/50's  . Cancer Cousin        type unk, died in 53's/50's  . Breast cancer Cousin        x2  . Other Other 25       brain tumor  . Allergic rhinitis Neg Hx   . Angioedema Neg Hx   . Asthma Neg Hx   . Atopy Neg Hx   . Eczema Neg Hx   . Immunodeficiency Neg Hx   . Urticaria Neg Hx     Vitals:  Blood pressure (!) 167/65, pulse 74, temperature 97.9 F (36.6 C), temperature source Tympanic, resp. rate 16, height '5\' 6"'  (1.676 m), weight 238 lb (108 kg), SpO2 100 %.  Physical Exam: WD in NAD Neck  Supple NROM, without any enlargements.  Skin  No rash/lesions/breakdown  Psychiatry  Alert and oriented to person, place, and time  Abdomen  Normoactive bowel sounds, abdomen soft, non-tender and obese without evidence of hernia.  Back No CVA tenderness Genito Urinary  Vulva/vagina: Normal external female genitalia.   No lesions. No discharge or bleeding.  Bladder/urethra:  No lesions or masses, well supported bladder  Vagina: normal vaginal tissues, no lesions/masses/blood  Adnexa: no palpable  masses. Rectal  deferred Extremities  No bilateral cyanosis, clubbing or edema.     Thereasa Solo, MD 08/30/2020, 3:44 PM

## 2020-09-05 ENCOUNTER — Other Ambulatory Visit: Payer: Self-pay

## 2020-09-05 ENCOUNTER — Ambulatory Visit (HOSPITAL_COMMUNITY)
Admission: RE | Admit: 2020-09-05 | Discharge: 2020-09-05 | Disposition: A | Discharge status: Home / Self Care | Payer: Medicare Other | Source: Ambulatory Visit | Attending: Gynecologic Oncology | Admitting: Gynecologic Oncology

## 2020-09-05 ENCOUNTER — Encounter (HOSPITAL_COMMUNITY): Payer: Self-pay

## 2020-09-05 DIAGNOSIS — C541 Malignant neoplasm of endometrium: Secondary | ICD-10-CM | POA: Insufficient documentation

## 2020-09-05 DIAGNOSIS — R109 Unspecified abdominal pain: Secondary | ICD-10-CM | POA: Diagnosis not present

## 2020-09-05 DIAGNOSIS — I700 Atherosclerosis of aorta: Secondary | ICD-10-CM | POA: Diagnosis not present

## 2020-09-05 DIAGNOSIS — N200 Calculus of kidney: Secondary | ICD-10-CM | POA: Diagnosis not present

## 2020-09-05 DIAGNOSIS — K5730 Diverticulosis of large intestine without perforation or abscess without bleeding: Secondary | ICD-10-CM | POA: Diagnosis not present

## 2020-09-05 MED ORDER — IOHEXOL 300 MG/ML  SOLN
60.0000 mL | Freq: Once | INTRAMUSCULAR | Status: AC | PRN
  Administered 2020-09-05: 60 mL via INTRAVENOUS

## 2020-09-05 MED ORDER — SODIUM CHLORIDE (PF) 0.9 % IJ SOLN
INTRAMUSCULAR | Status: AC
  Filled 2020-09-05: qty 50

## 2020-09-08 ENCOUNTER — Telehealth: Payer: Self-pay

## 2020-09-08 NOTE — Progress Notes (Signed)
Remote pacemaker transmission.   

## 2020-09-08 NOTE — Telephone Encounter (Signed)
Told Alicia [name] that the scan shows to evidence of recurrent or metastatic carcinoma with in the abdomen or pelvis  It shows a small right kidney stone that is very small and not obstructing any structures. She has diverticulosis in the colon but no evidence of diverticulitis. She has some aortic atherosclerosis  Above results per Melissa Cross,NP. Pt verbalized understanding.

## 2020-10-06 DIAGNOSIS — E7849 Other hyperlipidemia: Secondary | ICD-10-CM | POA: Diagnosis not present

## 2020-10-06 DIAGNOSIS — N1831 Chronic kidney disease, stage 3a: Secondary | ICD-10-CM | POA: Diagnosis not present

## 2020-10-06 DIAGNOSIS — R5381 Other malaise: Secondary | ICD-10-CM | POA: Diagnosis not present

## 2020-10-06 DIAGNOSIS — E782 Mixed hyperlipidemia: Secondary | ICD-10-CM | POA: Diagnosis not present

## 2020-10-06 DIAGNOSIS — E039 Hypothyroidism, unspecified: Secondary | ICD-10-CM | POA: Diagnosis not present

## 2020-10-06 DIAGNOSIS — E1122 Type 2 diabetes mellitus with diabetic chronic kidney disease: Secondary | ICD-10-CM | POA: Diagnosis not present

## 2020-10-12 DIAGNOSIS — Z8739 Personal history of other diseases of the musculoskeletal system and connective tissue: Secondary | ICD-10-CM | POA: Diagnosis not present

## 2020-10-12 DIAGNOSIS — C541 Malignant neoplasm of endometrium: Secondary | ICD-10-CM | POA: Diagnosis not present

## 2020-10-12 DIAGNOSIS — F331 Major depressive disorder, recurrent, moderate: Secondary | ICD-10-CM | POA: Diagnosis not present

## 2020-10-12 DIAGNOSIS — E039 Hypothyroidism, unspecified: Secondary | ICD-10-CM | POA: Diagnosis not present

## 2020-10-12 DIAGNOSIS — E1122 Type 2 diabetes mellitus with diabetic chronic kidney disease: Secondary | ICD-10-CM | POA: Diagnosis not present

## 2020-10-12 DIAGNOSIS — E7849 Other hyperlipidemia: Secondary | ICD-10-CM | POA: Diagnosis not present

## 2020-10-12 DIAGNOSIS — L500 Allergic urticaria: Secondary | ICD-10-CM | POA: Diagnosis not present

## 2020-10-12 DIAGNOSIS — I10 Essential (primary) hypertension: Secondary | ICD-10-CM | POA: Diagnosis not present

## 2020-11-21 ENCOUNTER — Ambulatory Visit (INDEPENDENT_AMBULATORY_CARE_PROVIDER_SITE_OTHER): Payer: Medicare Other

## 2020-11-21 DIAGNOSIS — I441 Atrioventricular block, second degree: Secondary | ICD-10-CM | POA: Diagnosis not present

## 2020-11-22 LAB — CUP PACEART REMOTE DEVICE CHECK
Battery Remaining Longevity: 48 mo
Battery Remaining Percentage: 37 %
Battery Voltage: 2.96 V
Brady Statistic AP VP Percent: 1 %
Brady Statistic AP VS Percent: 47 %
Brady Statistic AS VP Percent: 1 %
Brady Statistic AS VS Percent: 52 %
Brady Statistic RA Percent Paced: 48 %
Brady Statistic RV Percent Paced: 1.2 %
Date Time Interrogation Session: 20220718020014
Implantable Lead Implant Date: 20140811
Implantable Lead Implant Date: 20140811
Implantable Lead Location: 753859
Implantable Lead Location: 753860
Implantable Pulse Generator Implant Date: 20140811
Lead Channel Impedance Value: 400 Ohm
Lead Channel Impedance Value: 430 Ohm
Lead Channel Pacing Threshold Amplitude: 0.625 V
Lead Channel Pacing Threshold Amplitude: 0.75 V
Lead Channel Pacing Threshold Pulse Width: 0.4 ms
Lead Channel Pacing Threshold Pulse Width: 0.4 ms
Lead Channel Sensing Intrinsic Amplitude: 12 mV
Lead Channel Sensing Intrinsic Amplitude: 3.2 mV
Lead Channel Setting Pacing Amplitude: 1 V
Lead Channel Setting Pacing Amplitude: 1.625
Lead Channel Setting Pacing Pulse Width: 0.4 ms
Lead Channel Setting Sensing Sensitivity: 2 mV
Pulse Gen Model: 2240
Pulse Gen Serial Number: 7528878

## 2020-12-14 NOTE — Progress Notes (Signed)
Remote pacemaker transmission.   

## 2020-12-22 ENCOUNTER — Encounter: Payer: Self-pay | Admitting: Internal Medicine

## 2021-02-05 NOTE — Progress Notes (Signed)
Radiation Oncology         [phone] ________________________________  Name: Alicia [name] MRN: [id number]  Date: 02/06/2021  DOB: [birth year]  Follow-Up Visit Note  CC: Caryl Bis, MD  Everitt Amber, MD    ICD-10-CM   1. 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  Narrative:  The patient returns today for routine follow-up, she was last seen for follow-up on 02/08/2020.              Since her last visit, the patient followed up with Dr. Denman George on 08/30/20, during which time, the patient was noted to show no evidence of disease recurrence on exam. The patient did report at this time to have some intermittent left flank pain. Back x-rays performed were negative. CT of the abdomen and pelvis ordered by Dr. Denman George on 09/05/20 demonstrated a tiny right renal calculus (with no evidence of ureteral calculi or hydronephrosis), and colonic diverticulosis. Otherwise, no evidence of recurrent or metastatic carcinoma was visualized within the abdomen or pelvis.    she denies any abdominal bloating pelvic pain vaginal bleeding or discharge.  She denies any hematuria or rectal bleeding.  She denies any significant problems with urinary incontinence.  Allergies:  is allergic to ace inhibitors, cardizem [diltiazem hcl], oxycodone, statins, sulfa antibiotics, tape, aspirin, cortisone, latex, and tartrazine.  Meds: Current Outpatient Medications  Medication Sig Dispense Refill   apixaban (ELIQUIS) 5 MG TABS tablet Take 1 tablet (5 mg total) by mouth 2 (two) times daily. 60 tablet 11   co-enzyme Q-10 30 MG capsule Take 30 mg by mouth daily.     DULoxetine (CYMBALTA) 30 MG capsule Take 30 mg by mouth daily.      famotidine (PEPCID) 20 MG tablet Take 20 mg by mouth daily.     glipiZIDE (GLUCOTROL XL) 2.5 MG 24 hr tablet Take 2.5 mg by mouth daily with breakfast.     hydrochlorothiazide (HYDRODIURIL) 25 MG tablet Take 25 mg by mouth daily.      insulin NPH-regular Human (NOVOLIN 70/30) (70-30) 100 UNIT/ML injection Inject 50 Units into the skin 2 (two) times daily with a meal. Pt takes 48 units every morning, and 48 units every evening     INSULIN SYRINGE 1CC/29G 29G X 1/2" 1 ML MISC      levothyroxine (SYNTHROID) 112 MCG tablet TAKE 1 TABLET BY MOUTH ONCE DAILY BEFORE BREAKFAST 90 tablet 3   Misc Natural Products (ESSIAC TONIC) CAPS Take 2 capsules by mouth 2 (two) times daily.     Multiple Vitamins-Minerals (ICAPS) CAPS Take 1 capsule by mouth daily.      Polyethyl Glycol-Propyl Glycol 0.4-0.3 % SOLN Place 1 drop into both eyes daily as needed (dry eyes).      Probiotic Product (PROBIOTIC DAILY PO) Take 1 tablet by mouth daily.     diltiazem (CARDIZEM) 30 MG tablet Take 30 mg by mouth See admin instructions. Pt to take as needed if going into AFIB (Patient not taking: No sig reported)     flecainide (TAMBOCOR) 100 MG tablet Take 300 mg by mouth as needed. TAKE 3 TABLETS (300 MG) ONCE AS NEEDED (Patient not taking: Reported on  08/24/2020)     loratadine (CLARITIN) 10 MG tablet Take 10 mg by mouth daily. (Patient not taking: Reported on 02/06/2021)     No current facility-administered medications for this encounter.    Physical Findings: The patient is in no acute distress. Patient is alert and oriented.  weight is 221 lb 6.4 oz (100.4 kg). Her temperature is 97 F (36.1 C) (abnormal). Her blood pressure is 149/63 (abnormal) and her pulse is 67. Her respiration is 18 and oxygen saturation is 99%. .  No significant changes. Lungs are clear to auscultation bilaterally. Heart has regular rate and rhythm. No palpable cervical, supraclavicular, or axillary adenopathy. Abdomen soft, non-tender, normal bowel  sounds.  Pelvic examination the external genitalia are unremarkable.  A speculum exam is performed.  There are no mucosal lesions noted within vaginal vault.  Mild radiation changes noted at the vaginal cuff.  On bimanual and rectovaginal examination no pelvic masses appreciated.  Vaginal cuff intact.  Rectal sphincter tone slightly decreased.   Lab Findings: Lab Results  Component Value Date   WBC 6.7 11/10/2017   HGB 12.0 11/10/2017   HCT 36.5 11/10/2017   MCV 95.8 11/10/2017   PLT 242 11/10/2017    Radiographic Findings: No results found.  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  The patient does not exhibit any long-term effects from her vaginal brachytherapy.  Plan: Routine follow-up in 1 year.  Patient will be seen by gynecologic oncology in 6 months.   20 minutes of total time was spent for this patient encounter, including preparation, face-to-face counseling with the patient and coordination of care, physical exam, and documentation of the encounter. ____________________________________  Blair Promise, PhD, MD   This document serves as a record of services personally performed by Gery Pray, MD. It was created on his behalf by Roney Mans, a trained medical scribe. The creation of this record is based on the scribe's personal observations and the provider's statements to them. This document has been checked and approved by the attending provider.

## 2021-02-06 ENCOUNTER — Encounter: Payer: Self-pay | Admitting: Radiation Oncology

## 2021-02-06 ENCOUNTER — Other Ambulatory Visit: Payer: Self-pay

## 2021-02-06 ENCOUNTER — Ambulatory Visit
Admission: RE | Admit: 2021-02-06 | Discharge: 2021-02-06 | Disposition: A | Discharge status: Home / Self Care | Payer: Medicare Other | Source: Ambulatory Visit | Attending: Radiation Oncology | Admitting: Radiation Oncology

## 2021-02-06 DIAGNOSIS — Z7901 Long term (current) use of anticoagulants: Secondary | ICD-10-CM | POA: Diagnosis not present

## 2021-02-06 DIAGNOSIS — Z8542 Personal history of malignant neoplasm of other parts of uterus: Secondary | ICD-10-CM | POA: Diagnosis not present

## 2021-02-06 DIAGNOSIS — C541 Malignant neoplasm of endometrium: Secondary | ICD-10-CM

## 2021-02-06 DIAGNOSIS — R109 Unspecified abdominal pain: Secondary | ICD-10-CM | POA: Diagnosis not present

## 2021-02-06 DIAGNOSIS — Z923 Personal history of irradiation: Secondary | ICD-10-CM | POA: Insufficient documentation

## 2021-02-06 DIAGNOSIS — Z08 Encounter for follow-up examination after completed treatment for malignant neoplasm: Secondary | ICD-10-CM | POA: Diagnosis not present

## 2021-02-06 NOTE — Progress Notes (Signed)
Alicia [name] is here today for follow up post radiation to the pelvic.  They completed their radiation on: 04/25/2017   Does the patient complain of any of the following:  Pain:Patient denies pain.  Abdominal bloating: no Diarrhea/Constipation: no Nausea/Vomiting: no Vaginal Discharge: no Blood in Urine or Stool: no Urinary Issues (dysuria/incomplete emptying/ incontinence/ increased frequency/urgency): Patient reports having urinary frequency.  Does patient report using vaginal dilator 2-3 times a week and/or sexually active 2-3 weeks: Patient not using dilator or being sexually active.  Post radiation skin changes: no   Additional comments if applicable:   Vitals:   02/06/21 1506  BP: (!) 149/63  Pulse: 67  Resp: 18  Temp: (!) 97 F (36.1 C)  SpO2: 99%  Weight: 221 lb 6.4 oz (100.4 kg)

## 2021-02-07 DIAGNOSIS — L820 Inflamed seborrheic keratosis: Secondary | ICD-10-CM | POA: Diagnosis not present

## 2021-02-07 DIAGNOSIS — L570 Actinic keratosis: Secondary | ICD-10-CM | POA: Diagnosis not present

## 2021-02-07 DIAGNOSIS — C44319 Basal cell carcinoma of skin of other parts of face: Secondary | ICD-10-CM | POA: Diagnosis not present

## 2021-02-07 DIAGNOSIS — D1801 Hemangioma of skin and subcutaneous tissue: Secondary | ICD-10-CM | POA: Diagnosis not present

## 2021-02-07 DIAGNOSIS — L821 Other seborrheic keratosis: Secondary | ICD-10-CM | POA: Diagnosis not present

## 2021-02-07 DIAGNOSIS — D485 Neoplasm of uncertain behavior of skin: Secondary | ICD-10-CM | POA: Diagnosis not present

## 2021-02-07 DIAGNOSIS — L719 Rosacea, unspecified: Secondary | ICD-10-CM | POA: Diagnosis not present

## 2021-02-20 ENCOUNTER — Ambulatory Visit (INDEPENDENT_AMBULATORY_CARE_PROVIDER_SITE_OTHER): Payer: Medicare Other

## 2021-02-20 DIAGNOSIS — Z0001 Encounter for general adult medical examination with abnormal findings: Secondary | ICD-10-CM | POA: Diagnosis not present

## 2021-02-20 DIAGNOSIS — I10 Essential (primary) hypertension: Secondary | ICD-10-CM | POA: Diagnosis not present

## 2021-02-20 DIAGNOSIS — E7849 Other hyperlipidemia: Secondary | ICD-10-CM | POA: Diagnosis not present

## 2021-02-20 DIAGNOSIS — E1122 Type 2 diabetes mellitus with diabetic chronic kidney disease: Secondary | ICD-10-CM | POA: Diagnosis not present

## 2021-02-20 DIAGNOSIS — K2100 Gastro-esophageal reflux disease with esophagitis, without bleeding: Secondary | ICD-10-CM | POA: Diagnosis not present

## 2021-02-20 DIAGNOSIS — E039 Hypothyroidism, unspecified: Secondary | ICD-10-CM | POA: Diagnosis not present

## 2021-02-20 DIAGNOSIS — E782 Mixed hyperlipidemia: Secondary | ICD-10-CM | POA: Diagnosis not present

## 2021-02-20 DIAGNOSIS — I441 Atrioventricular block, second degree: Secondary | ICD-10-CM

## 2021-02-20 DIAGNOSIS — E8881 Metabolic syndrome: Secondary | ICD-10-CM | POA: Diagnosis not present

## 2021-02-20 DIAGNOSIS — N1831 Chronic kidney disease, stage 3a: Secondary | ICD-10-CM | POA: Diagnosis not present

## 2021-02-21 LAB — CUP PACEART REMOTE DEVICE CHECK
Battery Remaining Longevity: 46 mo
Battery Remaining Percentage: 35 %
Battery Voltage: 2.96 V
Brady Statistic AP VP Percent: 1 %
Brady Statistic AP VS Percent: 41 %
Brady Statistic AS VP Percent: 1 %
Brady Statistic AS VS Percent: 58 %
Brady Statistic RA Percent Paced: 41 %
Brady Statistic RV Percent Paced: 1.1 %
Date Time Interrogation Session: 20221017041623
Implantable Lead Implant Date: 20140811
Implantable Lead Implant Date: 20140811
Implantable Lead Location: 753859
Implantable Lead Location: 753860
Implantable Pulse Generator Implant Date: 20140811
Lead Channel Impedance Value: 360 Ohm
Lead Channel Impedance Value: 430 Ohm
Lead Channel Pacing Threshold Amplitude: 0.625 V
Lead Channel Pacing Threshold Amplitude: 0.75 V
Lead Channel Pacing Threshold Pulse Width: 0.4 ms
Lead Channel Pacing Threshold Pulse Width: 0.4 ms
Lead Channel Sensing Intrinsic Amplitude: 12 mV
Lead Channel Sensing Intrinsic Amplitude: 3.3 mV
Lead Channel Setting Pacing Amplitude: 1 V
Lead Channel Setting Pacing Amplitude: 1.625
Lead Channel Setting Pacing Pulse Width: 0.4 ms
Lead Channel Setting Sensing Sensitivity: 2 mV
Pulse Gen Model: 2240
Pulse Gen Serial Number: 7528878

## 2021-02-23 DIAGNOSIS — E039 Hypothyroidism, unspecified: Secondary | ICD-10-CM | POA: Diagnosis not present

## 2021-02-23 DIAGNOSIS — E1122 Type 2 diabetes mellitus with diabetic chronic kidney disease: Secondary | ICD-10-CM | POA: Diagnosis not present

## 2021-02-23 DIAGNOSIS — Z6835 Body mass index (BMI) 35.0-35.9, adult: Secondary | ICD-10-CM | POA: Diagnosis not present

## 2021-02-23 DIAGNOSIS — C541 Malignant neoplasm of endometrium: Secondary | ICD-10-CM | POA: Diagnosis not present

## 2021-02-23 DIAGNOSIS — I10 Essential (primary) hypertension: Secondary | ICD-10-CM | POA: Diagnosis not present

## 2021-02-23 DIAGNOSIS — L500 Allergic urticaria: Secondary | ICD-10-CM | POA: Diagnosis not present

## 2021-02-23 DIAGNOSIS — E7849 Other hyperlipidemia: Secondary | ICD-10-CM | POA: Diagnosis not present

## 2021-02-23 DIAGNOSIS — Z0001 Encounter for general adult medical examination with abnormal findings: Secondary | ICD-10-CM | POA: Diagnosis not present

## 2021-03-01 NOTE — Progress Notes (Signed)
Remote pacemaker transmission.   

## 2021-03-18 DIAGNOSIS — J0101 Acute recurrent maxillary sinusitis: Secondary | ICD-10-CM | POA: Diagnosis not present

## 2021-03-18 DIAGNOSIS — E1122 Type 2 diabetes mellitus with diabetic chronic kidney disease: Secondary | ICD-10-CM | POA: Diagnosis not present

## 2021-03-28 DIAGNOSIS — C44319 Basal cell carcinoma of skin of other parts of face: Secondary | ICD-10-CM | POA: Diagnosis not present

## 2021-04-04 DIAGNOSIS — Z4802 Encounter for removal of sutures: Secondary | ICD-10-CM | POA: Diagnosis not present

## 2021-04-06 DIAGNOSIS — M1711 Unilateral primary osteoarthritis, right knee: Secondary | ICD-10-CM | POA: Diagnosis not present

## 2021-04-06 DIAGNOSIS — M1712 Unilateral primary osteoarthritis, left knee: Secondary | ICD-10-CM | POA: Diagnosis not present

## 2021-04-07 ENCOUNTER — Encounter: Payer: Medicare Other | Admitting: Internal Medicine

## 2021-04-13 DIAGNOSIS — M1712 Unilateral primary osteoarthritis, left knee: Secondary | ICD-10-CM | POA: Diagnosis not present

## 2021-04-13 DIAGNOSIS — M1711 Unilateral primary osteoarthritis, right knee: Secondary | ICD-10-CM | POA: Diagnosis not present

## 2021-04-14 ENCOUNTER — Ambulatory Visit (INDEPENDENT_AMBULATORY_CARE_PROVIDER_SITE_OTHER): Payer: Medicare Other | Admitting: Internal Medicine

## 2021-04-14 ENCOUNTER — Encounter: Payer: Self-pay | Admitting: Internal Medicine

## 2021-04-14 VITALS — BP 162/68 | HR 61 | Ht 66.0 in | Wt 225.0 lb

## 2021-04-14 DIAGNOSIS — D6869 Other thrombophilia: Secondary | ICD-10-CM | POA: Diagnosis not present

## 2021-04-14 DIAGNOSIS — I441 Atrioventricular block, second degree: Secondary | ICD-10-CM | POA: Diagnosis not present

## 2021-04-14 DIAGNOSIS — I480 Paroxysmal atrial fibrillation: Secondary | ICD-10-CM | POA: Diagnosis not present

## 2021-04-14 DIAGNOSIS — I10 Essential (primary) hypertension: Secondary | ICD-10-CM | POA: Diagnosis not present

## 2021-04-14 NOTE — Progress Notes (Signed)
PCP: Caryl Bis, MD   Primary EP:  Dr Aurea Graff Alicia [name] is a 75 y.o. female who presents today for routine electrophysiology followup.  Since last being seen in our clinic, the patient reports doing very well.  Today, she denies symptoms of palpitations, chest pain, shortness of breath,  lower extremity edema, dizziness, presyncope, or syncope.  The patient is otherwise without complaint today.   Past Medical History:  Diagnosis Date   Anemia    hx of   Anxiety    Cancer ([place])    endometrial cancer/ skin cancers removed nose non melanoma   Diabetes ([place])    Type 2   DJD (degenerative joint disease)    Dysrhythmia    SYMPTOMATIC BRADYCARDIA  / Afib   Family history of breast cancer    Family history of cancer    Gallstones    History of kidney stones    Hx of    History of radiation therapy 04/04/17-04/25/17   vaginal cuff 30 Gy in 5 fractions   Hypertension    HCTZ   Hypothyroidism    Obesity    Pacemaker 12/15/2012   DUAL CHAMBER    DR Lovena Le   Sleep apnea    No machine Cant tolerate   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   Wears glasses    Past Surgical History:  Procedure Laterality Date   ANKLE SURGERY Right    multiple   BREAST SURGERY     biopsy L breast benign   CHOLECYSTECTOMY     COLONOSCOPY     EYE SURGERY     both cataracts   FOOT ARTHROTOMY     left   FOOT SURGERY     Bil  bone spurs /Left  foot neuroma removed Right foot sinus tarside surgery Right bone in toe removed   KNEE SURGERY     right   PACEMAKER INSERTION  12/15/2012   SJM 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 by Dr Lovena Le for 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   PERMANENT PACEMAKER INSERTION N/A 12/15/2012   SJM 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 by Dr Lovena Le for transient AV block   ROBOTIC ASSISTED TOTAL HYSTERECTOMY WITH BILATERAL SALPINGO OOPHERECTOMY N/A 02/19/2017   Procedure: XI ROBOTIC ASSISTED TOTAL HYSTERECTOMY WITH BILATERAL SALPINGO OOPHORECTOMY;  Surgeon: Everitt Amber, MD;   Location: [facility];  Service: Gynecology;  Laterality: N/A;   SENTINEL NODE BIOPSY N/A 02/19/2017   Procedure: SENTINEL NODE BIOPSY;  Surgeon: Everitt Amber, MD;  Location: [facility];  Service: Gynecology;  Laterality: N/A;   SHOULDER ARTHROSCOPY WITH SUBACROMIAL DECOMPRESSION AND OPEN ROTATOR C Right 07/07/2013   Procedure: RIGHT SHOULDER ARTHROSCOPY WITH SUBACROMIAL DECOMPRESSION AND ROTATOR CUFF DEBRIDEMENT/ DISTAL CLAVICLE RESECTION;  Surgeon: [name], MD;  Location: [place];  Service: Orthopedics;  Laterality: Right;   SHOULDER SURGERY Right 2012    ROS- all systems are reviewed and negative except as per HPI above  Current Outpatient Medications  Medication Sig Dispense Refill   apixaban (ELIQUIS) 5 MG TABS tablet Take 1 tablet (5 mg total) by mouth 2 (two) times daily. 60 tablet 11   co-enzyme Q-10 30 MG capsule Take 30 mg by mouth daily.     diltiazem (CARDIZEM) 30 MG tablet Take 30 mg by mouth See admin instructions. Pt to take as needed if going into AFIB     DULoxetine (CYMBALTA) 30 MG capsule Take 30 mg by mouth daily.     famotidine (  PEPCID) 20 MG tablet Take 20 mg by mouth daily.     flecainide (TAMBOCOR) 100 MG tablet Take 300 mg by mouth as needed. TAKE 3 TABLETS (300 MG) ONCE AS NEEDED     glipiZIDE (GLUCOTROL XL) 2.5 MG 24 hr tablet Take 2.5 mg by mouth daily with breakfast.     hydrochlorothiazide (HYDRODIURIL) 25 MG tablet Take 25 mg by mouth daily.      insulin NPH-regular Human (NOVOLIN 70/30) (70-30) 100 UNIT/ML injection Inject 50 Units into the skin 2 (two) times daily with a meal. Pt takes 48 units every morning, and 48 units every evening     INSULIN SYRINGE 1CC/29G 29G X 1/2" 1 ML MISC      levothyroxine (SYNTHROID) 112 MCG tablet TAKE 1 TABLET BY MOUTH ONCE DAILY BEFORE BREAKFAST 90 tablet 3   loratadine (CLARITIN) 10 MG tablet Take 10 mg by mouth daily.     Misc Natural Products (ESSIAC TONIC) CAPS Take 2 capsules by mouth 2 (two) times  daily.     Multiple Vitamins-Minerals (ICAPS) CAPS Take 1 capsule by mouth daily.      Polyethyl Glycol-Propyl Glycol 0.4-0.3 % SOLN Place 1 drop into both eyes daily as needed (dry eyes).      Probiotic Product (PROBIOTIC DAILY PO) Take 1 tablet by mouth daily.     No current facility-administered medications for this visit.    Physical Exam: Vitals:   04/14/21 1035  BP: (!) 162/68  Pulse: 61  SpO2: 97%  Weight: 225 lb (102.1 kg)  Height: 5\' 6"  (1.676 m)    GEN- The patient is overweight appearing, alert and oriented x 3 today.   Head- normocephalic, atraumatic Eyes-  Sclera clear, conjunctiva pink Ears- hearing intact Oropharynx- clear Lungs-  normal work of breathing Chest- pacemaker pocket is well healed Heart- Regular rate and rhythm  GI- soft, NT, ND, + BS Extremities- no clubbing, cyanosis, or edema  Pacemaker interrogation- reviewed in detail today,  See PACEART report   Assessment and Plan:  1. Symptomatic second degree heart block Normal pacemaker function See Pace Art report No changes today she is not device dependant today  2. Paroxysmal atrial fibrillation Afib burden is <1%  Chads2vasc score is 4.  She is on eliquis. She has not had to use her "pill in pocket" flecainide in over 3 years    3. HTN Elevated today She checks her BP frequently at home and notes that it is well controlled  4. Obesity Body mass index is 36.32 kg/m.   Lifestyle modification advised  5. OSA Not using CPAP  Return in a year  Thompson Grayer MD, [facility] 04/14/2021 11:10 AM

## 2021-04-14 NOTE — Patient Instructions (Signed)
Medication Instructions:  Continue all current medications.  Labwork: none  Testing/Procedures: none  Follow-Up: 1 year - Dr.  Allred   Any Other Special Instructions Will Be Listed Below (If Applicable).   If you need a refill on your cardiac medications before your next appointment, please call your pharmacy.  

## 2021-04-20 DIAGNOSIS — M1711 Unilateral primary osteoarthritis, right knee: Secondary | ICD-10-CM | POA: Diagnosis not present

## 2021-05-22 ENCOUNTER — Ambulatory Visit (INDEPENDENT_AMBULATORY_CARE_PROVIDER_SITE_OTHER): Payer: Medicare Other

## 2021-05-22 DIAGNOSIS — I441 Atrioventricular block, second degree: Secondary | ICD-10-CM

## 2021-05-22 LAB — CUP PACEART REMOTE DEVICE CHECK
Battery Remaining Longevity: 43 mo
Battery Remaining Percentage: 32 %
Battery Voltage: 2.95 V
Brady Statistic AP VP Percent: 1 %
Brady Statistic AP VS Percent: 51 %
Brady Statistic AS VP Percent: 1 %
Brady Statistic AS VS Percent: 48 %
Brady Statistic RA Percent Paced: 51 %
Brady Statistic RV Percent Paced: 1.2 %
Date Time Interrogation Session: 20230116035617
Implantable Lead Implant Date: 20140811
Implantable Lead Implant Date: 20140811
Implantable Lead Location: 753859
Implantable Lead Location: 753860
Implantable Pulse Generator Implant Date: 20140811
Lead Channel Impedance Value: 400 Ohm
Lead Channel Impedance Value: 430 Ohm
Lead Channel Pacing Threshold Amplitude: 0.625 V
Lead Channel Pacing Threshold Amplitude: 0.75 V
Lead Channel Pacing Threshold Pulse Width: 0.4 ms
Lead Channel Pacing Threshold Pulse Width: 0.4 ms
Lead Channel Sensing Intrinsic Amplitude: 12 mV
Lead Channel Sensing Intrinsic Amplitude: 3 mV
Lead Channel Setting Pacing Amplitude: 1 V
Lead Channel Setting Pacing Amplitude: 1.625
Lead Channel Setting Pacing Pulse Width: 0.4 ms
Lead Channel Setting Sensing Sensitivity: 2 mV
Pulse Gen Model: 2240
Pulse Gen Serial Number: 7528878

## 2021-06-02 NOTE — Progress Notes (Signed)
Remote pacemaker transmission.   

## 2021-06-26 ENCOUNTER — Telehealth: Payer: Self-pay | Admitting: *Deleted

## 2021-06-26 NOTE — Telephone Encounter (Signed)
CALLED PATIENT TO INFORM OF FU APPT. WITH DR. [place] ON 08-30-21 - ARRIVAL TIME - 10 AM, SPOKE WITH PATIENT AND SHE IS AWARE OF THIS APPT.

## 2021-06-27 DIAGNOSIS — I10 Essential (primary) hypertension: Secondary | ICD-10-CM | POA: Diagnosis not present

## 2021-06-27 DIAGNOSIS — E1165 Type 2 diabetes mellitus with hyperglycemia: Secondary | ICD-10-CM | POA: Diagnosis not present

## 2021-06-27 DIAGNOSIS — E7849 Other hyperlipidemia: Secondary | ICD-10-CM | POA: Diagnosis not present

## 2021-06-27 DIAGNOSIS — E1122 Type 2 diabetes mellitus with diabetic chronic kidney disease: Secondary | ICD-10-CM | POA: Diagnosis not present

## 2021-06-27 DIAGNOSIS — K2100 Gastro-esophageal reflux disease with esophagitis, without bleeding: Secondary | ICD-10-CM | POA: Diagnosis not present

## 2021-06-27 DIAGNOSIS — E782 Mixed hyperlipidemia: Secondary | ICD-10-CM | POA: Diagnosis not present

## 2021-06-29 DIAGNOSIS — E7849 Other hyperlipidemia: Secondary | ICD-10-CM | POA: Diagnosis not present

## 2021-06-29 DIAGNOSIS — T466X5D Adverse effect of antihyperlipidemic and antiarteriosclerotic drugs, subsequent encounter: Secondary | ICD-10-CM | POA: Diagnosis not present

## 2021-06-29 DIAGNOSIS — M609 Myositis, unspecified: Secondary | ICD-10-CM | POA: Diagnosis not present

## 2021-06-29 DIAGNOSIS — E1122 Type 2 diabetes mellitus with diabetic chronic kidney disease: Secondary | ICD-10-CM | POA: Diagnosis not present

## 2021-06-29 DIAGNOSIS — I10 Essential (primary) hypertension: Secondary | ICD-10-CM | POA: Diagnosis not present

## 2021-06-29 DIAGNOSIS — E039 Hypothyroidism, unspecified: Secondary | ICD-10-CM | POA: Diagnosis not present

## 2021-06-29 DIAGNOSIS — I700 Atherosclerosis of aorta: Secondary | ICD-10-CM | POA: Diagnosis not present

## 2021-07-14 ENCOUNTER — Other Ambulatory Visit: Payer: Self-pay | Admitting: Obstetrics & Gynecology

## 2021-07-14 DIAGNOSIS — J0100 Acute maxillary sinusitis, unspecified: Secondary | ICD-10-CM | POA: Diagnosis not present

## 2021-08-21 ENCOUNTER — Ambulatory Visit (INDEPENDENT_AMBULATORY_CARE_PROVIDER_SITE_OTHER): Payer: Medicare Other

## 2021-08-21 DIAGNOSIS — I441 Atrioventricular block, second degree: Secondary | ICD-10-CM | POA: Diagnosis not present

## 2021-08-22 LAB — CUP PACEART REMOTE DEVICE CHECK
Battery Remaining Longevity: 40 mo
Battery Remaining Percentage: 30 %
Battery Voltage: 2.93 V
Brady Statistic AP VP Percent: 1 %
Brady Statistic AP VS Percent: 50 %
Brady Statistic AS VP Percent: 1 %
Brady Statistic AS VS Percent: 49 %
Brady Statistic RA Percent Paced: 50 %
Brady Statistic RV Percent Paced: 1.2 %
Date Time Interrogation Session: 20230417020014
Implantable Lead Implant Date: 20140811
Implantable Lead Implant Date: 20140811
Implantable Lead Location: 753859
Implantable Lead Location: 753860
Implantable Pulse Generator Implant Date: 20140811
Lead Channel Impedance Value: 360 Ohm
Lead Channel Impedance Value: 430 Ohm
Lead Channel Pacing Threshold Amplitude: 0.625 V
Lead Channel Pacing Threshold Amplitude: 0.75 V
Lead Channel Pacing Threshold Pulse Width: 0.4 ms
Lead Channel Pacing Threshold Pulse Width: 0.4 ms
Lead Channel Sensing Intrinsic Amplitude: 12 mV
Lead Channel Sensing Intrinsic Amplitude: 2.9 mV
Lead Channel Setting Pacing Amplitude: 1 V
Lead Channel Setting Pacing Amplitude: 1.625
Lead Channel Setting Pacing Pulse Width: 0.4 ms
Lead Channel Setting Sensing Sensitivity: 2 mV
Pulse Gen Model: 2240
Pulse Gen Serial Number: 7528878

## 2021-08-30 ENCOUNTER — Ambulatory Visit: Payer: Medicare Other | Admitting: Obstetrics & Gynecology

## 2021-09-07 DIAGNOSIS — M1711 Unilateral primary osteoarthritis, right knee: Secondary | ICD-10-CM | POA: Diagnosis not present

## 2021-09-07 DIAGNOSIS — M1712 Unilateral primary osteoarthritis, left knee: Secondary | ICD-10-CM | POA: Diagnosis not present

## 2021-09-07 NOTE — Progress Notes (Signed)
Remote pacemaker transmission.   

## 2021-10-19 DIAGNOSIS — E7849 Other hyperlipidemia: Secondary | ICD-10-CM | POA: Diagnosis not present

## 2021-10-19 DIAGNOSIS — N183 Chronic kidney disease, stage 3 unspecified: Secondary | ICD-10-CM | POA: Diagnosis not present

## 2021-10-19 DIAGNOSIS — E782 Mixed hyperlipidemia: Secondary | ICD-10-CM | POA: Diagnosis not present

## 2021-10-19 DIAGNOSIS — E1165 Type 2 diabetes mellitus with hyperglycemia: Secondary | ICD-10-CM | POA: Diagnosis not present

## 2021-10-19 DIAGNOSIS — I10 Essential (primary) hypertension: Secondary | ICD-10-CM | POA: Diagnosis not present

## 2021-10-19 DIAGNOSIS — E039 Hypothyroidism, unspecified: Secondary | ICD-10-CM | POA: Diagnosis not present

## 2021-10-19 DIAGNOSIS — K219 Gastro-esophageal reflux disease without esophagitis: Secondary | ICD-10-CM | POA: Diagnosis not present

## 2021-10-19 DIAGNOSIS — E1122 Type 2 diabetes mellitus with diabetic chronic kidney disease: Secondary | ICD-10-CM | POA: Diagnosis not present

## 2021-10-25 DIAGNOSIS — E7849 Other hyperlipidemia: Secondary | ICD-10-CM | POA: Diagnosis not present

## 2021-10-25 DIAGNOSIS — G4733 Obstructive sleep apnea (adult) (pediatric): Secondary | ICD-10-CM | POA: Diagnosis not present

## 2021-10-25 DIAGNOSIS — N1831 Chronic kidney disease, stage 3a: Secondary | ICD-10-CM | POA: Diagnosis not present

## 2021-10-25 DIAGNOSIS — Z9189 Other specified personal risk factors, not elsewhere classified: Secondary | ICD-10-CM | POA: Diagnosis not present

## 2021-10-25 DIAGNOSIS — E1122 Type 2 diabetes mellitus with diabetic chronic kidney disease: Secondary | ICD-10-CM | POA: Diagnosis not present

## 2021-10-25 DIAGNOSIS — I700 Atherosclerosis of aorta: Secondary | ICD-10-CM | POA: Diagnosis not present

## 2021-10-25 DIAGNOSIS — L500 Allergic urticaria: Secondary | ICD-10-CM | POA: Diagnosis not present

## 2021-10-25 DIAGNOSIS — I10 Essential (primary) hypertension: Secondary | ICD-10-CM | POA: Diagnosis not present

## 2021-10-25 DIAGNOSIS — E039 Hypothyroidism, unspecified: Secondary | ICD-10-CM | POA: Diagnosis not present

## 2021-10-25 DIAGNOSIS — E782 Mixed hyperlipidemia: Secondary | ICD-10-CM | POA: Diagnosis not present

## 2021-10-25 DIAGNOSIS — T466X5D Adverse effect of antihyperlipidemic and antiarteriosclerotic drugs, subsequent encounter: Secondary | ICD-10-CM | POA: Diagnosis not present

## 2021-10-25 DIAGNOSIS — M609 Myositis, unspecified: Secondary | ICD-10-CM | POA: Diagnosis not present

## 2021-11-13 DIAGNOSIS — M1711 Unilateral primary osteoarthritis, right knee: Secondary | ICD-10-CM | POA: Diagnosis not present

## 2021-11-20 ENCOUNTER — Ambulatory Visit (INDEPENDENT_AMBULATORY_CARE_PROVIDER_SITE_OTHER): Payer: Medicare Other

## 2021-11-20 DIAGNOSIS — I441 Atrioventricular block, second degree: Secondary | ICD-10-CM

## 2021-11-20 DIAGNOSIS — M1711 Unilateral primary osteoarthritis, right knee: Secondary | ICD-10-CM | POA: Diagnosis not present

## 2021-11-21 LAB — CUP PACEART REMOTE DEVICE CHECK
Battery Remaining Longevity: 37 mo
Battery Remaining Percentage: 28 %
Battery Voltage: 2.93 V
Brady Statistic AP VP Percent: 1 %
Brady Statistic AP VS Percent: 50 %
Brady Statistic AS VP Percent: 1 %
Brady Statistic AS VS Percent: 49 %
Brady Statistic RA Percent Paced: 50 %
Brady Statistic RV Percent Paced: 1.3 %
Date Time Interrogation Session: 20230717020014
Implantable Lead Implant Date: 20140811
Implantable Lead Implant Date: 20140811
Implantable Lead Location: 753859
Implantable Lead Location: 753860
Implantable Pulse Generator Implant Date: 20140811
Lead Channel Impedance Value: 360 Ohm
Lead Channel Impedance Value: 430 Ohm
Lead Channel Pacing Threshold Amplitude: 0.625 V
Lead Channel Pacing Threshold Amplitude: 0.75 V
Lead Channel Pacing Threshold Pulse Width: 0.4 ms
Lead Channel Pacing Threshold Pulse Width: 0.4 ms
Lead Channel Sensing Intrinsic Amplitude: 12 mV
Lead Channel Sensing Intrinsic Amplitude: 2.9 mV
Lead Channel Setting Pacing Amplitude: 1 V
Lead Channel Setting Pacing Amplitude: 1.625
Lead Channel Setting Pacing Pulse Width: 0.4 ms
Lead Channel Setting Sensing Sensitivity: 2 mV
Pulse Gen Model: 2240
Pulse Gen Serial Number: 7528878

## 2021-11-22 ENCOUNTER — Ambulatory Visit: Payer: Medicare Other | Admitting: Obstetrics & Gynecology

## 2021-11-27 DIAGNOSIS — M1711 Unilateral primary osteoarthritis, right knee: Secondary | ICD-10-CM | POA: Diagnosis not present

## 2021-12-12 ENCOUNTER — Encounter: Payer: Self-pay | Admitting: Obstetrics & Gynecology

## 2021-12-13 ENCOUNTER — Encounter: Payer: Self-pay | Admitting: Obstetrics & Gynecology

## 2021-12-13 ENCOUNTER — Inpatient Hospital Stay: Payer: Medicare Other | Attending: Obstetrics & Gynecology | Admitting: Obstetrics & Gynecology

## 2021-12-13 ENCOUNTER — Other Ambulatory Visit: Payer: Self-pay

## 2021-12-13 VITALS — BP 161/47 | HR 66 | Temp 97.8°F | Resp 17 | Wt 221.0 lb

## 2021-12-13 DIAGNOSIS — Z8542 Personal history of malignant neoplasm of other parts of uterus: Secondary | ICD-10-CM | POA: Insufficient documentation

## 2021-12-13 DIAGNOSIS — Z923 Personal history of irradiation: Secondary | ICD-10-CM | POA: Diagnosis not present

## 2021-12-13 DIAGNOSIS — C541 Malignant neoplasm of endometrium: Secondary | ICD-10-CM

## 2021-12-13 NOTE — Progress Notes (Signed)
Follow Up Note: Gyn-Onc  Alicia [name] 76 y.o. female  CC: She presents for a f/u visit   HPI: The oncology history was reviewed.  Interval History: She complained of left flank pain at the last visit. A CTAP in 5/22 showed diverticulosis; no evidence of recurrent/metastatic disease.  She was seen by Dr. Sondra Come in 10/22 and was felt to be clinically w/o evidence of recurrence at that visit.    Review of Systems  Review of Systems  Constitutional:  Negative for malaise/fatigue and weight loss.  Respiratory:  Negative for shortness of breath and wheezing.   Cardiovascular:  Negative for chest pain and leg swelling.  Gastrointestinal:  Negative for abdominal pain, blood in stool, constipation, nausea and vomiting.  Genitourinary:  Negative for dysuria, frequency, hematuria and urgency.  Musculoskeletal:  Negative for joint pain and myalgias.  Neurological:  Negative for weakness.  Psychiatric/Behavioral:  Negative for depression. The patient does not have insomnia.    Current medications, allergy, social history, past surgical history, past medical history, family history were all reviewed.    Vitals:  BP (!) 161/47 (BP Location: Left Arm, Patient Position: Sitting)   Pulse 66   Temp 97.8 F (36.6 C) (Oral)   Resp 17   Wt 221 lb (100.2 kg)   SpO2 100%   BMI 35.67 kg/m    Physical Exam:  Physical Exam Exam conducted with a chaperone present.  Constitutional:      General: She is not in acute distress. Cardiovascular:     Rate and Rhythm: Normal rate and regular rhythm.  Pulmonary:     Effort: Pulmonary effort is normal.     Breath sounds: Normal breath sounds. No wheezing or rhonchi.  Abdominal:     Palpations: Abdomen is soft.     Tenderness: There is no abdominal tenderness. There is no right CVA tenderness or left CVA tenderness.     Hernia: No hernia is present.  Genitourinary:    General: Normal vulva.     Urethra: No urethral lesion.     Vagina: No lesions. No  bleeding Musculoskeletal:     Cervical back: Neck supple.     Right lower leg: No edema.     Left lower leg: No edema.  Lymphadenopathy:     Upper Body:     Right upper body: No supraclavicular adenopathy.     Left upper body: No supraclavicular adenopathy.     Lower Body: No right inguinal adenopathy. No left inguinal adenopathy.  Skin:    Findings: No rash.  Neurological:     Mental Status: She is oriented to person, place, and time.   Assessment/Plan:  Endometrial cancer ([place]) 76 year old with a stage IA grade 2 endometrioid endometrial cancer.High/intermediate risk factors for recurrence. MSI high (genetic testing negative). S/p vaginal brachytherapy completed December, 2018.   No evidence of recurrence.      >Keep next scheduled appointment w/Dr. Sondra Come >F/U with her generalist gynecologist appropriate in 1 yr   I personally spent 25 minutes face-to-face and non-face-to-face in the care of this patient, which includes all pre, intra, and post visit time on the date of service.    Lahoma Crocker, MD

## 2021-12-13 NOTE — Patient Instructions (Addendum)
HAPPY GRADUATION:  Follow up with her Audiological scientist.

## 2021-12-13 NOTE — Assessment & Plan Note (Addendum)
76 year old with a stage IA grade 2 endometrioid endometrial cancer.High/intermediate risk factors for recurrence. MSI high (genetic testing negative). S/p vaginal brachytherapy completed December, 2018.  No evidence of recurrence.    >Keep next scheduled appointment w/Dr. Sondra Come >F/U with her generalist gynecologist appropriate in 1 yr

## 2021-12-21 NOTE — Progress Notes (Signed)
Remote pacemaker transmission.   

## 2022-01-30 ENCOUNTER — Telehealth: Payer: Self-pay | Admitting: *Deleted

## 2022-01-30 NOTE — Telephone Encounter (Signed)
RETURNED PATIENT'S PHONE CALL, SPOKE WITH PATIENT. ?

## 2022-02-05 ENCOUNTER — Ambulatory Visit: Payer: Self-pay | Admitting: Radiation Oncology

## 2022-02-14 DIAGNOSIS — L821 Other seborrheic keratosis: Secondary | ICD-10-CM | POA: Diagnosis not present

## 2022-02-14 DIAGNOSIS — D1801 Hemangioma of skin and subcutaneous tissue: Secondary | ICD-10-CM | POA: Diagnosis not present

## 2022-02-14 DIAGNOSIS — Z85828 Personal history of other malignant neoplasm of skin: Secondary | ICD-10-CM | POA: Diagnosis not present

## 2022-02-14 DIAGNOSIS — L719 Rosacea, unspecified: Secondary | ICD-10-CM | POA: Diagnosis not present

## 2022-02-14 DIAGNOSIS — L570 Actinic keratosis: Secondary | ICD-10-CM | POA: Diagnosis not present

## 2022-02-14 DIAGNOSIS — I781 Nevus, non-neoplastic: Secondary | ICD-10-CM | POA: Diagnosis not present

## 2022-02-14 DIAGNOSIS — L820 Inflamed seborrheic keratosis: Secondary | ICD-10-CM | POA: Diagnosis not present

## 2022-02-19 ENCOUNTER — Ambulatory Visit (INDEPENDENT_AMBULATORY_CARE_PROVIDER_SITE_OTHER): Payer: Medicare Other

## 2022-02-19 DIAGNOSIS — I441 Atrioventricular block, second degree: Secondary | ICD-10-CM | POA: Diagnosis not present

## 2022-02-20 LAB — CUP PACEART REMOTE DEVICE CHECK
Battery Remaining Longevity: 34 mo
Battery Remaining Percentage: 26 %
Battery Voltage: 2.92 V
Brady Statistic AP VP Percent: 1 %
Brady Statistic AP VS Percent: 46 %
Brady Statistic AS VP Percent: 1 %
Brady Statistic AS VS Percent: 52 %
Brady Statistic RA Percent Paced: 46 %
Brady Statistic RV Percent Paced: 1.4 %
Date Time Interrogation Session: 20231016025919
Implantable Lead Implant Date: 20140811
Implantable Lead Implant Date: 20140811
Implantable Lead Location: 753859
Implantable Lead Location: 753860
Implantable Pulse Generator Implant Date: 20140811
Lead Channel Impedance Value: 360 Ohm
Lead Channel Impedance Value: 410 Ohm
Lead Channel Pacing Threshold Amplitude: 0.625 V
Lead Channel Pacing Threshold Amplitude: 0.75 V
Lead Channel Pacing Threshold Pulse Width: 0.4 ms
Lead Channel Pacing Threshold Pulse Width: 0.4 ms
Lead Channel Sensing Intrinsic Amplitude: 12 mV
Lead Channel Sensing Intrinsic Amplitude: 2.9 mV
Lead Channel Setting Pacing Amplitude: 1 V
Lead Channel Setting Pacing Amplitude: 1.625
Lead Channel Setting Pacing Pulse Width: 0.4 ms
Lead Channel Setting Sensing Sensitivity: 2 mV
Pulse Gen Model: 2240
Pulse Gen Serial Number: 7528878

## 2022-02-21 DIAGNOSIS — L600 Ingrowing nail: Secondary | ICD-10-CM | POA: Diagnosis not present

## 2022-02-21 DIAGNOSIS — E1151 Type 2 diabetes mellitus with diabetic peripheral angiopathy without gangrene: Secondary | ICD-10-CM | POA: Diagnosis not present

## 2022-02-21 DIAGNOSIS — M79674 Pain in right toe(s): Secondary | ICD-10-CM | POA: Diagnosis not present

## 2022-02-21 DIAGNOSIS — E1140 Type 2 diabetes mellitus with diabetic neuropathy, unspecified: Secondary | ICD-10-CM | POA: Diagnosis not present

## 2022-02-21 DIAGNOSIS — M79672 Pain in left foot: Secondary | ICD-10-CM | POA: Diagnosis not present

## 2022-02-21 DIAGNOSIS — M79675 Pain in left toe(s): Secondary | ICD-10-CM | POA: Diagnosis not present

## 2022-02-21 DIAGNOSIS — M79671 Pain in right foot: Secondary | ICD-10-CM | POA: Diagnosis not present

## 2022-02-23 DIAGNOSIS — E039 Hypothyroidism, unspecified: Secondary | ICD-10-CM | POA: Diagnosis not present

## 2022-02-23 DIAGNOSIS — E7849 Other hyperlipidemia: Secondary | ICD-10-CM | POA: Diagnosis not present

## 2022-02-23 DIAGNOSIS — E782 Mixed hyperlipidemia: Secondary | ICD-10-CM | POA: Diagnosis not present

## 2022-02-23 DIAGNOSIS — K2100 Gastro-esophageal reflux disease with esophagitis, without bleeding: Secondary | ICD-10-CM | POA: Diagnosis not present

## 2022-02-23 DIAGNOSIS — E1165 Type 2 diabetes mellitus with hyperglycemia: Secondary | ICD-10-CM | POA: Diagnosis not present

## 2022-02-23 DIAGNOSIS — N1831 Chronic kidney disease, stage 3a: Secondary | ICD-10-CM | POA: Diagnosis not present

## 2022-02-23 DIAGNOSIS — I10 Essential (primary) hypertension: Secondary | ICD-10-CM | POA: Diagnosis not present

## 2022-02-23 DIAGNOSIS — E1122 Type 2 diabetes mellitus with diabetic chronic kidney disease: Secondary | ICD-10-CM | POA: Diagnosis not present

## 2022-03-03 DIAGNOSIS — M609 Myositis, unspecified: Secondary | ICD-10-CM | POA: Diagnosis not present

## 2022-03-03 DIAGNOSIS — E1122 Type 2 diabetes mellitus with diabetic chronic kidney disease: Secondary | ICD-10-CM | POA: Diagnosis not present

## 2022-03-03 DIAGNOSIS — I700 Atherosclerosis of aorta: Secondary | ICD-10-CM | POA: Diagnosis not present

## 2022-03-03 DIAGNOSIS — E039 Hypothyroidism, unspecified: Secondary | ICD-10-CM | POA: Diagnosis not present

## 2022-03-03 DIAGNOSIS — E1342 Other specified diabetes mellitus with diabetic polyneuropathy: Secondary | ICD-10-CM | POA: Diagnosis not present

## 2022-03-03 DIAGNOSIS — G4733 Obstructive sleep apnea (adult) (pediatric): Secondary | ICD-10-CM | POA: Diagnosis not present

## 2022-03-03 DIAGNOSIS — I10 Essential (primary) hypertension: Secondary | ICD-10-CM | POA: Diagnosis not present

## 2022-03-03 DIAGNOSIS — Z0001 Encounter for general adult medical examination with abnormal findings: Secondary | ICD-10-CM | POA: Diagnosis not present

## 2022-03-03 DIAGNOSIS — T466X5D Adverse effect of antihyperlipidemic and antiarteriosclerotic drugs, subsequent encounter: Secondary | ICD-10-CM | POA: Diagnosis not present

## 2022-03-03 DIAGNOSIS — L500 Allergic urticaria: Secondary | ICD-10-CM | POA: Diagnosis not present

## 2022-03-03 DIAGNOSIS — E7849 Other hyperlipidemia: Secondary | ICD-10-CM | POA: Diagnosis not present

## 2022-03-06 DIAGNOSIS — N183 Chronic kidney disease, stage 3 unspecified: Secondary | ICD-10-CM | POA: Diagnosis not present

## 2022-03-06 DIAGNOSIS — F17210 Nicotine dependence, cigarettes, uncomplicated: Secondary | ICD-10-CM | POA: Diagnosis not present

## 2022-03-06 DIAGNOSIS — Z0001 Encounter for general adult medical examination with abnormal findings: Secondary | ICD-10-CM | POA: Diagnosis not present

## 2022-03-06 DIAGNOSIS — E7849 Other hyperlipidemia: Secondary | ICD-10-CM | POA: Diagnosis not present

## 2022-03-06 DIAGNOSIS — I10 Essential (primary) hypertension: Secondary | ICD-10-CM | POA: Diagnosis not present

## 2022-03-06 DIAGNOSIS — I482 Chronic atrial fibrillation, unspecified: Secondary | ICD-10-CM | POA: Diagnosis not present

## 2022-03-06 DIAGNOSIS — E1122 Type 2 diabetes mellitus with diabetic chronic kidney disease: Secondary | ICD-10-CM | POA: Diagnosis not present

## 2022-03-06 DIAGNOSIS — I700 Atherosclerosis of aorta: Secondary | ICD-10-CM | POA: Diagnosis not present

## 2022-03-06 DIAGNOSIS — E039 Hypothyroidism, unspecified: Secondary | ICD-10-CM | POA: Diagnosis not present

## 2022-03-10 NOTE — Progress Notes (Signed)
Radiation Oncology         (336) [phone] ________________________________  Name: Alicia [name] MRN: [id number]  Date: 03/12/2022  DOB: 1945-10-08  Follow-Up Visit Note  CC: Caryl Bis, MD  Everitt Amber, MD  No diagnosis found.  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  Narrative:  The patient returns today for routine annual follow-up. She was last seen here for follow-up on 02/06/21. Since her last visit, the patient followed up with Dr. Delsa Sale on 12/13/21. During which time, the patient denied any symptoms concerning for disease recurrence and was NED on examination.   The patient was also diagnosed with basal cell carcinoma of the right cheek at the end of October 2022.       ***                         Allergies:  is allergic to ace inhibitors, cardizem [diltiazem hcl], oxycodone, statins, sulfa antibiotics, tape, aspirin, cortisone, latex, and tartrazine.  Meds: Current Outpatient Medications  Medication Sig Dispense Refill   apixaban (ELIQUIS) 5 MG TABS tablet Take 1 tablet (5 mg total) by mouth 2 (two) times daily. 60 tablet 11   diltiazem (CARDIZEM) 30 MG tablet Take 30 mg by mouth See admin instructions. Pt to take as needed if going into AFIB     flecainide (TAMBOCOR) 100 MG tablet Take 300 mg by mouth as needed. TAKE 3 TABLETS (300 MG) ONCE AS NEEDED     glipiZIDE (GLUCOTROL XL) 2.5 MG 24 hr tablet Take 2.5 mg by mouth daily with breakfast.     hydrochlorothiazide (HYDRODIURIL) 25 MG tablet Take 25 mg by mouth daily.      insulin NPH-regular Human (NOVOLIN 70/30) (70-30) 100 UNIT/ML injection Inject 50 Units into the skin 2 (two) times daily with a meal. Pt takes 48 units every morning, and 48 units every  evening     INSULIN SYRINGE 1CC/29G 29G X 1/2" 1 ML MISC      levothyroxine (SYNTHROID) 112 MCG tablet TAKE 1 TABLET BY MOUTH ONCE DAILY BEFORE BREAKFAST 90 tablet 3   loratadine (CLARITIN) 10 MG tablet Take 10 mg by mouth daily.     Misc Natural Products (ESSIAC TONIC) CAPS Take 2 capsules by mouth 2 (two) times daily.     Polyethyl Glycol-Propyl Glycol (SYSTANE) 0.4-0.3 % SOLN Apply to eye.     Probiotic Product (PROBIOTIC DAILY PO) Take 1 tablet by mouth daily.     No current facility-administered medications for this encounter.    Physical Findings: The patient is in no acute distress. Patient is alert and oriented.  vitals were not taken for this visit. .  No significant changes. Lungs are clear to auscultation bilaterally. Heart has regular rate and rhythm. No palpable cervical, supraclavicular, or axillary adenopathy. Abdomen soft, non-tender, normal bowel sounds.  On pelvic examination the external genitalia were unremarkable. A speculum exam was performed. There are no mucosal lesions noted in the vaginal vault. A Pap smear was obtained of the proximal vagina. On bimanual and rectovaginal examination there were no pelvic masses appreciated. ***    Lab Findings: Lab Results  Component Value Date   WBC 6.7 11/10/2017   HGB 12.0  11/10/2017   HCT 36.5 11/10/2017   MCV 95.8 11/10/2017   PLT 242 11/10/2017    Radiographic Findings: CUP PACEART REMOTE DEVICE CHECK  Result Date: 02/20/2022 Scheduled remote reviewed. Normal device function.  4 AMS, 4-12sec in duration Next remote 91 days. 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  ***  Plan:  ***   *** minutes of total time was spent for this patient encounter, including preparation, face-to-face counseling with the patient and coordination of care, physical exam, and documentation of the  encounter. ____________________________________  Blair Promise, PhD, MD  This document serves as a record of services personally performed by Gery Pray, MD. It was created on his behalf by Roney Mans, a trained medical scribe. The creation of this record is based on the scribe's personal observations and the provider's statements to them. This document has been checked and approved by the attending provider.

## 2022-03-12 ENCOUNTER — Encounter: Payer: Self-pay | Admitting: Radiation Oncology

## 2022-03-12 ENCOUNTER — Ambulatory Visit
Admission: RE | Admit: 2022-03-12 | Discharge: 2022-03-12 | Disposition: A | Discharge status: Home / Self Care | Payer: Medicare Other | Source: Ambulatory Visit | Attending: Radiation Oncology | Admitting: Radiation Oncology

## 2022-03-12 VITALS — BP 161/51 | HR 71 | Temp 97.7°F | Resp 20 | Ht 66.0 in | Wt 230.8 lb

## 2022-03-12 DIAGNOSIS — Z8542 Personal history of malignant neoplasm of other parts of uterus: Secondary | ICD-10-CM | POA: Insufficient documentation

## 2022-03-12 DIAGNOSIS — Z7901 Long term (current) use of anticoagulants: Secondary | ICD-10-CM | POA: Diagnosis not present

## 2022-03-12 DIAGNOSIS — Z79890 Hormone replacement therapy (postmenopausal): Secondary | ICD-10-CM | POA: Insufficient documentation

## 2022-03-12 DIAGNOSIS — Z7984 Long term (current) use of oral hypoglycemic drugs: Secondary | ICD-10-CM | POA: Diagnosis not present

## 2022-03-12 DIAGNOSIS — Z923 Personal history of irradiation: Secondary | ICD-10-CM | POA: Insufficient documentation

## 2022-03-12 DIAGNOSIS — Z79899 Other long term (current) drug therapy: Secondary | ICD-10-CM | POA: Diagnosis not present

## 2022-03-12 DIAGNOSIS — C541 Malignant neoplasm of endometrium: Secondary | ICD-10-CM

## 2022-03-12 NOTE — Progress Notes (Signed)
Alicia [name] is here today for follow up post radiation to the pelvic.  They completed their radiation on: 04/25/17  Does the patient complain of any of the following:  Pain: No Abdominal bloating: No Diarrhea/Constipation: Diarrhea at times.  Nausea/Vomiting: No Vaginal Discharge: No Blood in Urine or Stool: No Urinary Issues (dysuria/incomplete emptying/ incontinence/ increased frequency/urgency):Reports increased frequency mostly at night.  Does patient report using vaginal dilator 2-3 times a week and/or sexually active 2-3 weeks: No Post radiation skin changes: No   Additional comments if applicable:    BP (!) 940/76 (BP Location: Right Arm, Patient Position: Sitting, Cuff Size: Large)   Pulse 71   Temp 97.7 F (36.5 C)   Resp 20   Ht '5\' 6"'$  (1.676 m)   Wt 230 lb 12.8 oz (104.7 kg)   SpO2 96%   BMI 37.25 kg/m

## 2022-03-13 NOTE — Progress Notes (Signed)
Remote pacemaker transmission.   

## 2022-04-06 ENCOUNTER — Encounter: Payer: Medicare Other | Admitting: Internal Medicine

## 2022-04-26 DIAGNOSIS — M1711 Unilateral primary osteoarthritis, right knee: Secondary | ICD-10-CM | POA: Diagnosis not present

## 2022-05-15 DIAGNOSIS — E1151 Type 2 diabetes mellitus with diabetic peripheral angiopathy without gangrene: Secondary | ICD-10-CM | POA: Diagnosis not present

## 2022-05-15 DIAGNOSIS — L600 Ingrowing nail: Secondary | ICD-10-CM | POA: Diagnosis not present

## 2022-05-15 DIAGNOSIS — M79675 Pain in left toe(s): Secondary | ICD-10-CM | POA: Diagnosis not present

## 2022-05-15 DIAGNOSIS — E1140 Type 2 diabetes mellitus with diabetic neuropathy, unspecified: Secondary | ICD-10-CM | POA: Diagnosis not present

## 2022-05-15 DIAGNOSIS — M79671 Pain in right foot: Secondary | ICD-10-CM | POA: Diagnosis not present

## 2022-05-15 DIAGNOSIS — M79674 Pain in right toe(s): Secondary | ICD-10-CM | POA: Diagnosis not present

## 2022-05-15 DIAGNOSIS — M79672 Pain in left foot: Secondary | ICD-10-CM | POA: Diagnosis not present

## 2022-05-21 ENCOUNTER — Ambulatory Visit (INDEPENDENT_AMBULATORY_CARE_PROVIDER_SITE_OTHER): Payer: Medicare Other

## 2022-05-21 DIAGNOSIS — I441 Atrioventricular block, second degree: Secondary | ICD-10-CM

## 2022-05-23 LAB — CUP PACEART REMOTE DEVICE CHECK
Battery Remaining Longevity: 30 mo
Battery Remaining Percentage: 24 %
Battery Voltage: 2.9 V
Brady Statistic AP VP Percent: 1 %
Brady Statistic AP VS Percent: 48 %
Brady Statistic AS VP Percent: 1 %
Brady Statistic AS VS Percent: 51 %
Brady Statistic RA Percent Paced: 48 %
Brady Statistic RV Percent Paced: 1.3 %
Date Time Interrogation Session: 20240115023927
Implantable Lead Connection Status: 753985
Implantable Lead Connection Status: 753985
Implantable Lead Implant Date: 20140811
Implantable Lead Implant Date: 20140811
Implantable Lead Location: 753859
Implantable Lead Location: 753860
Implantable Pulse Generator Implant Date: 20140811
Lead Channel Impedance Value: 380 Ohm
Lead Channel Impedance Value: 430 Ohm
Lead Channel Pacing Threshold Amplitude: 0.625 V
Lead Channel Pacing Threshold Amplitude: 0.75 V
Lead Channel Pacing Threshold Pulse Width: 0.4 ms
Lead Channel Pacing Threshold Pulse Width: 0.4 ms
Lead Channel Sensing Intrinsic Amplitude: 12 mV
Lead Channel Sensing Intrinsic Amplitude: 2.9 mV
Lead Channel Setting Pacing Amplitude: 1 V
Lead Channel Setting Pacing Amplitude: 1.625
Lead Channel Setting Pacing Pulse Width: 0.4 ms
Lead Channel Setting Sensing Sensitivity: 2 mV
Pulse Gen Model: 2240
Pulse Gen Serial Number: 7528878

## 2022-05-25 ENCOUNTER — Encounter: Payer: Self-pay | Admitting: Cardiovascular Disease

## 2022-05-25 ENCOUNTER — Ambulatory Visit: Payer: Medicare Other | Attending: Internal Medicine | Admitting: Cardiovascular Disease

## 2022-05-25 VITALS — BP 150/60 | HR 60 | Ht 67.0 in | Wt 226.4 lb

## 2022-05-25 DIAGNOSIS — I441 Atrioventricular block, second degree: Secondary | ICD-10-CM

## 2022-05-25 DIAGNOSIS — I480 Paroxysmal atrial fibrillation: Secondary | ICD-10-CM | POA: Diagnosis not present

## 2022-05-25 DIAGNOSIS — Z950 Presence of cardiac pacemaker: Secondary | ICD-10-CM

## 2022-05-25 NOTE — Patient Instructions (Addendum)
Medication Instructions:  Continue all current medications.  Labwork: none  Testing/Procedures: none  Follow-Up: 1 year - Dr.  Mealor    Any Other Special Instructions Will Be Listed Below (If Applicable).   If you need a refill on your cardiac medications before your next appointment, please call your pharmacy.  

## 2022-05-25 NOTE — Addendum Note (Signed)
Addended by: Laurine Blazer on: 05/25/2022 04:43 PM   Modules accepted: Orders

## 2022-05-25 NOTE — Progress Notes (Signed)
PCP: Caryl Bis, MD   Primary EP:  Dr Leonides Cave Alicia [name] is a 77 y.o. female who presents today for routine electrophysiology followup.  Since last being seen in our clinic, the patient reports doing very well.  Today, she denies symptoms of palpitations, chest pain, shortness of breath,  lower extremity edema, dizziness, presyncope, or syncope.  The patient is otherwise without complaint today.   Past Medical History:  Diagnosis Date   Anemia    hx of   Anxiety    Cancer ([place])    endometrial cancer/ skin cancers removed nose non melanoma   Diabetes ([place])    Type 2   DJD (degenerative joint disease)    Dysrhythmia    SYMPTOMATIC BRADYCARDIA  / Afib   Family history of breast cancer    Family history of cancer    Gallstones    History of kidney stones    Hx of    History of radiation therapy 04/04/17-04/25/17   vaginal cuff 30 Gy in 5 fractions   Hypertension    HCTZ   Hypothyroidism    Obesity    Pacemaker 12/15/2012   DUAL CHAMBER    DR Lovena Le   Sleep apnea    No machine Cant tolerate   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   Wears glasses    Past Surgical History:  Procedure Laterality Date   ANKLE SURGERY Right    multiple   BREAST SURGERY     biopsy L breast benign   CHOLECYSTECTOMY     COLONOSCOPY     EYE SURGERY     both cataracts   FOOT ARTHROTOMY     left   FOOT SURGERY     Bil  bone spurs /Left  foot neuroma removed Right foot sinus tarside surgery Right bone in toe removed   KNEE SURGERY     right   PACEMAKER INSERTION  12/15/2012   SJM 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 by Dr Lovena Le for [facility] 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   PERMANENT PACEMAKER INSERTION N/A 12/15/2012   SJM 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 by Dr Lovena Le for transient AV block   ROBOTIC ASSISTED TOTAL HYSTERECTOMY WITH BILATERAL SALPINGO OOPHERECTOMY N/A 02/19/2017   Procedure: XI ROBOTIC ASSISTED TOTAL HYSTERECTOMY WITH BILATERAL SALPINGO OOPHORECTOMY;  Surgeon: Everitt Amber, MD;   Location: [facility];  Service: Gynecology;  Laterality: N/A;   SENTINEL NODE BIOPSY N/A 02/19/2017   Procedure: SENTINEL NODE BIOPSY;  Surgeon: Everitt Amber, MD;  Location: [facility];  Service: Gynecology;  Laterality: N/A;   SHOULDER ARTHROSCOPY WITH SUBACROMIAL DECOMPRESSION AND OPEN ROTATOR C Right 07/07/2013   Procedure: RIGHT SHOULDER ARTHROSCOPY WITH SUBACROMIAL DECOMPRESSION AND ROTATOR CUFF DEBRIDEMENT/ DISTAL CLAVICLE RESECTION;  Surgeon: [name], MD;  Location: [place];  Service: Orthopedics;  Laterality: Right;   SHOULDER SURGERY Right 2012    ROS- all systems are reviewed and negative except as per HPI above  Current Outpatient Medications  Medication Sig Dispense Refill   apixaban (ELIQUIS) 5 MG TABS tablet Take 1 tablet (5 mg total) by mouth 2 (two) times daily. 60 tablet 11   diltiazem (CARDIZEM) 30 MG tablet Take 30 mg by mouth See admin instructions. Pt to take as needed if going into AFIB     flecainide (TAMBOCOR) 100 MG tablet Take 300 mg by mouth as needed. TAKE 3 TABLETS (300 MG) ONCE AS NEEDED     glipiZIDE (GLUCOTROL XL) 2.5 MG 24 hr  tablet Take 2.5 mg by mouth daily with breakfast.     hydrochlorothiazide (HYDRODIURIL) 25 MG tablet Take 25 mg by mouth daily.      insulin NPH-regular Human (NOVOLIN 70/30) (70-30) 100 UNIT/ML injection Inject 50 Units into the skin 2 (two) times daily with a meal. Pt takes 48 units every morning, and 48 units every evening     INSULIN SYRINGE 1CC/29G 29G X 1/2" 1 ML MISC      levothyroxine (SYNTHROID) 112 MCG tablet TAKE 1 TABLET BY MOUTH ONCE DAILY BEFORE BREAKFAST 90 tablet 3   loratadine (CLARITIN) 10 MG tablet Take 10 mg by mouth daily.     Misc Natural Products (ESSIAC TONIC) CAPS Take 2 capsules by mouth 2 (two) times daily.     Polyethyl Glycol-Propyl Glycol (SYSTANE) 0.4-0.3 % SOLN Apply to eye.     Probiotic Product (PROBIOTIC DAILY PO) Take 1 tablet by mouth daily.     No current facility-administered  medications for this visit.    Physical Exam: Vitals:   05/25/22 1141  BP: (!) 150/60  Pulse: 60  SpO2: 97%  Weight: 226 lb 6.4 oz (102.7 kg)  Height: '5\' 7"'$  (1.702 m)    Gen: Appears comfortable, well-nourished CV: RRR, no dependent edema The device site is normal -- no tenderness, edema, drainage, redness, threatened erosion. Pulm: breathing easily   Pacemaker interrogation- reviewed in detail today,  See PACEART report   Assessment and Plan:  1. Symptomatic second degree heart block Normal pacemaker function See Pace Art report No changes today she is not device dependant today  2. Paroxysmal atrial fibrillation Afib burden is <1%  Chads2vasc score is 4.  She is on eliquis. She has not had to use her "pill in pocket" flecainide in over 4 years. I am going to discontinue it. She has diltiazem as needed    3. HTN Elevated today She checks her BP frequently at home and notes that it is well controlled  4. Obesity Body mass index is 35.46 kg/m.   Lifestyle modification advised  5. OSA Not using CPAP  Return in a year  Alicia Quitter, MD  05/25/2022 11:55 AM

## 2022-05-28 DIAGNOSIS — M1711 Unilateral primary osteoarthritis, right knee: Secondary | ICD-10-CM | POA: Diagnosis not present

## 2022-06-05 ENCOUNTER — Encounter: Payer: Self-pay | Admitting: Cardiovascular Disease

## 2022-06-08 LAB — CUP PACEART INCLINIC DEVICE CHECK
Date Time Interrogation Session: 20240119150004
Implantable Lead Connection Status: 753985
Implantable Lead Connection Status: 753985
Implantable Lead Implant Date: 20140811
Implantable Lead Implant Date: 20140811
Implantable Lead Location: 753859
Implantable Lead Location: 753860
Implantable Pulse Generator Implant Date: 20140811
Pulse Gen Model: 2240
Pulse Gen Serial Number: 7528878

## 2022-06-29 DIAGNOSIS — M1711 Unilateral primary osteoarthritis, right knee: Secondary | ICD-10-CM | POA: Diagnosis not present

## 2022-07-05 DIAGNOSIS — M1711 Unilateral primary osteoarthritis, right knee: Secondary | ICD-10-CM | POA: Diagnosis not present

## 2022-07-06 NOTE — Progress Notes (Signed)
Remote pacemaker transmission.   

## 2022-07-09 DIAGNOSIS — I10 Essential (primary) hypertension: Secondary | ICD-10-CM | POA: Diagnosis not present

## 2022-07-09 DIAGNOSIS — K2100 Gastro-esophageal reflux disease with esophagitis, without bleeding: Secondary | ICD-10-CM | POA: Diagnosis not present

## 2022-07-09 DIAGNOSIS — E1165 Type 2 diabetes mellitus with hyperglycemia: Secondary | ICD-10-CM | POA: Diagnosis not present

## 2022-07-09 DIAGNOSIS — N183 Chronic kidney disease, stage 3 unspecified: Secondary | ICD-10-CM | POA: Diagnosis not present

## 2022-07-09 DIAGNOSIS — E1122 Type 2 diabetes mellitus with diabetic chronic kidney disease: Secondary | ICD-10-CM | POA: Diagnosis not present

## 2022-07-09 DIAGNOSIS — E7849 Other hyperlipidemia: Secondary | ICD-10-CM | POA: Diagnosis not present

## 2022-07-11 DIAGNOSIS — E7849 Other hyperlipidemia: Secondary | ICD-10-CM | POA: Diagnosis not present

## 2022-07-11 DIAGNOSIS — E1122 Type 2 diabetes mellitus with diabetic chronic kidney disease: Secondary | ICD-10-CM | POA: Diagnosis not present

## 2022-07-11 DIAGNOSIS — I10 Essential (primary) hypertension: Secondary | ICD-10-CM | POA: Diagnosis not present

## 2022-07-11 DIAGNOSIS — G4733 Obstructive sleep apnea (adult) (pediatric): Secondary | ICD-10-CM | POA: Diagnosis not present

## 2022-07-11 DIAGNOSIS — I700 Atherosclerosis of aorta: Secondary | ICD-10-CM | POA: Diagnosis not present

## 2022-07-11 DIAGNOSIS — T466X5D Adverse effect of antihyperlipidemic and antiarteriosclerotic drugs, subsequent encounter: Secondary | ICD-10-CM | POA: Diagnosis not present

## 2022-07-11 DIAGNOSIS — E039 Hypothyroidism, unspecified: Secondary | ICD-10-CM | POA: Diagnosis not present

## 2022-07-11 DIAGNOSIS — M609 Myositis, unspecified: Secondary | ICD-10-CM | POA: Diagnosis not present

## 2022-07-11 DIAGNOSIS — L500 Allergic urticaria: Secondary | ICD-10-CM | POA: Diagnosis not present

## 2022-07-11 DIAGNOSIS — E1342 Other specified diabetes mellitus with diabetic polyneuropathy: Secondary | ICD-10-CM | POA: Diagnosis not present

## 2022-07-12 DIAGNOSIS — M1711 Unilateral primary osteoarthritis, right knee: Secondary | ICD-10-CM | POA: Diagnosis not present

## 2022-07-25 DIAGNOSIS — M1712 Unilateral primary osteoarthritis, left knee: Secondary | ICD-10-CM | POA: Diagnosis not present

## 2022-07-30 DIAGNOSIS — R221 Localized swelling, mass and lump, neck: Secondary | ICD-10-CM | POA: Diagnosis not present

## 2022-07-31 DIAGNOSIS — M79671 Pain in right foot: Secondary | ICD-10-CM | POA: Diagnosis not present

## 2022-07-31 DIAGNOSIS — E1140 Type 2 diabetes mellitus with diabetic neuropathy, unspecified: Secondary | ICD-10-CM | POA: Diagnosis not present

## 2022-07-31 DIAGNOSIS — E1151 Type 2 diabetes mellitus with diabetic peripheral angiopathy without gangrene: Secondary | ICD-10-CM | POA: Diagnosis not present

## 2022-07-31 DIAGNOSIS — M79672 Pain in left foot: Secondary | ICD-10-CM | POA: Diagnosis not present

## 2022-07-31 DIAGNOSIS — M79675 Pain in left toe(s): Secondary | ICD-10-CM | POA: Diagnosis not present

## 2022-07-31 DIAGNOSIS — L600 Ingrowing nail: Secondary | ICD-10-CM | POA: Diagnosis not present

## 2022-07-31 DIAGNOSIS — M79674 Pain in right toe(s): Secondary | ICD-10-CM | POA: Diagnosis not present

## 2022-08-01 DIAGNOSIS — M1712 Unilateral primary osteoarthritis, left knee: Secondary | ICD-10-CM | POA: Diagnosis not present

## 2022-08-08 DIAGNOSIS — M1712 Unilateral primary osteoarthritis, left knee: Secondary | ICD-10-CM | POA: Diagnosis not present

## 2022-08-16 DIAGNOSIS — M1712 Unilateral primary osteoarthritis, left knee: Secondary | ICD-10-CM | POA: Diagnosis not present

## 2022-08-20 ENCOUNTER — Ambulatory Visit (INDEPENDENT_AMBULATORY_CARE_PROVIDER_SITE_OTHER): Payer: Medicare Other

## 2022-08-20 DIAGNOSIS — I441 Atrioventricular block, second degree: Secondary | ICD-10-CM | POA: Diagnosis not present

## 2022-08-21 LAB — CUP PACEART REMOTE DEVICE CHECK
Battery Remaining Longevity: 28 mo
Battery Remaining Percentage: 22 %
Battery Voltage: 2.89 V
Brady Statistic AP VP Percent: 1 %
Brady Statistic AP VS Percent: 52 %
Brady Statistic AS VP Percent: 1 %
Brady Statistic AS VS Percent: 47 %
Brady Statistic RA Percent Paced: 52 %
Brady Statistic RV Percent Paced: 1.4 %
Date Time Interrogation Session: 20240415020014
Implantable Lead Connection Status: 753985
Implantable Lead Connection Status: 753985
Implantable Lead Implant Date: 20140811
Implantable Lead Implant Date: 20140811
Implantable Lead Location: 753859
Implantable Lead Location: 753860
Implantable Pulse Generator Implant Date: 20140811
Lead Channel Impedance Value: 380 Ohm
Lead Channel Impedance Value: 430 Ohm
Lead Channel Pacing Threshold Amplitude: 0.5 V
Lead Channel Pacing Threshold Amplitude: 0.75 V
Lead Channel Pacing Threshold Pulse Width: 0.4 ms
Lead Channel Pacing Threshold Pulse Width: 0.4 ms
Lead Channel Sensing Intrinsic Amplitude: 12 mV
Lead Channel Sensing Intrinsic Amplitude: 2.9 mV
Lead Channel Setting Pacing Amplitude: 1 V
Lead Channel Setting Pacing Amplitude: 1.5 V
Lead Channel Setting Pacing Pulse Width: 0.4 ms
Lead Channel Setting Sensing Sensitivity: 2 mV
Pulse Gen Model: 2240
Pulse Gen Serial Number: 7528878

## 2022-09-25 NOTE — Progress Notes (Signed)
Remote pacemaker transmission.   

## 2022-10-09 DIAGNOSIS — L600 Ingrowing nail: Secondary | ICD-10-CM | POA: Diagnosis not present

## 2022-10-09 DIAGNOSIS — M79672 Pain in left foot: Secondary | ICD-10-CM | POA: Diagnosis not present

## 2022-10-09 DIAGNOSIS — E1151 Type 2 diabetes mellitus with diabetic peripheral angiopathy without gangrene: Secondary | ICD-10-CM | POA: Diagnosis not present

## 2022-10-09 DIAGNOSIS — M79674 Pain in right toe(s): Secondary | ICD-10-CM | POA: Diagnosis not present

## 2022-10-09 DIAGNOSIS — E1140 Type 2 diabetes mellitus with diabetic neuropathy, unspecified: Secondary | ICD-10-CM | POA: Diagnosis not present

## 2022-10-09 DIAGNOSIS — M79675 Pain in left toe(s): Secondary | ICD-10-CM | POA: Diagnosis not present

## 2022-10-09 DIAGNOSIS — M79671 Pain in right foot: Secondary | ICD-10-CM | POA: Diagnosis not present

## 2022-11-19 ENCOUNTER — Ambulatory Visit: Payer: Medicare Other

## 2022-11-19 DIAGNOSIS — I441 Atrioventricular block, second degree: Secondary | ICD-10-CM | POA: Diagnosis not present

## 2022-11-20 LAB — CUP PACEART REMOTE DEVICE CHECK
Battery Remaining Longevity: 25 mo
Battery Remaining Percentage: 20 %
Battery Voltage: 2.87 V
Brady Statistic AP VP Percent: 1 %
Brady Statistic AP VS Percent: 50 %
Brady Statistic AS VP Percent: 1 %
Brady Statistic AS VS Percent: 49 %
Brady Statistic RA Percent Paced: 50 %
Brady Statistic RV Percent Paced: 1.4 %
Date Time Interrogation Session: 20240715030252
Implantable Lead Connection Status: 753985
Implantable Lead Connection Status: 753985
Implantable Lead Implant Date: 20140811
Implantable Lead Implant Date: 20140811
Implantable Lead Location: 753859
Implantable Lead Location: 753860
Implantable Pulse Generator Implant Date: 20140811
Lead Channel Impedance Value: 360 Ohm
Lead Channel Impedance Value: 430 Ohm
Lead Channel Pacing Threshold Amplitude: 0.625 V
Lead Channel Pacing Threshold Amplitude: 0.75 V
Lead Channel Pacing Threshold Pulse Width: 0.4 ms
Lead Channel Pacing Threshold Pulse Width: 0.4 ms
Lead Channel Sensing Intrinsic Amplitude: 12 mV
Lead Channel Sensing Intrinsic Amplitude: 2.8 mV
Lead Channel Setting Pacing Amplitude: 1 V
Lead Channel Setting Pacing Amplitude: 1.625
Lead Channel Setting Pacing Pulse Width: 0.4 ms
Lead Channel Setting Sensing Sensitivity: 2 mV
Pulse Gen Model: 2240
Pulse Gen Serial Number: 7528878

## 2022-11-21 DIAGNOSIS — E1165 Type 2 diabetes mellitus with hyperglycemia: Secondary | ICD-10-CM | POA: Diagnosis not present

## 2022-11-21 DIAGNOSIS — E782 Mixed hyperlipidemia: Secondary | ICD-10-CM | POA: Diagnosis not present

## 2022-11-21 DIAGNOSIS — I10 Essential (primary) hypertension: Secondary | ICD-10-CM | POA: Diagnosis not present

## 2022-11-21 DIAGNOSIS — E1122 Type 2 diabetes mellitus with diabetic chronic kidney disease: Secondary | ICD-10-CM | POA: Diagnosis not present

## 2022-11-21 DIAGNOSIS — N1831 Chronic kidney disease, stage 3a: Secondary | ICD-10-CM | POA: Diagnosis not present

## 2022-11-21 DIAGNOSIS — E039 Hypothyroidism, unspecified: Secondary | ICD-10-CM | POA: Diagnosis not present

## 2022-11-21 DIAGNOSIS — E7849 Other hyperlipidemia: Secondary | ICD-10-CM | POA: Diagnosis not present

## 2022-11-26 DIAGNOSIS — I700 Atherosclerosis of aorta: Secondary | ICD-10-CM | POA: Diagnosis not present

## 2022-11-26 DIAGNOSIS — R197 Diarrhea, unspecified: Secondary | ICD-10-CM | POA: Diagnosis not present

## 2022-11-26 DIAGNOSIS — I482 Chronic atrial fibrillation, unspecified: Secondary | ICD-10-CM | POA: Diagnosis not present

## 2022-11-26 DIAGNOSIS — I10 Essential (primary) hypertension: Secondary | ICD-10-CM | POA: Diagnosis not present

## 2022-11-26 DIAGNOSIS — L500 Allergic urticaria: Secondary | ICD-10-CM | POA: Diagnosis not present

## 2022-11-26 DIAGNOSIS — E782 Mixed hyperlipidemia: Secondary | ICD-10-CM | POA: Diagnosis not present

## 2022-11-26 DIAGNOSIS — E1122 Type 2 diabetes mellitus with diabetic chronic kidney disease: Secondary | ICD-10-CM | POA: Diagnosis not present

## 2022-11-26 DIAGNOSIS — E7849 Other hyperlipidemia: Secondary | ICD-10-CM | POA: Diagnosis not present

## 2022-11-26 DIAGNOSIS — T466X5D Adverse effect of antihyperlipidemic and antiarteriosclerotic drugs, subsequent encounter: Secondary | ICD-10-CM | POA: Diagnosis not present

## 2022-11-26 DIAGNOSIS — Z23 Encounter for immunization: Secondary | ICD-10-CM | POA: Diagnosis not present

## 2022-11-26 DIAGNOSIS — G4733 Obstructive sleep apnea (adult) (pediatric): Secondary | ICD-10-CM | POA: Diagnosis not present

## 2022-11-26 DIAGNOSIS — M609 Myositis, unspecified: Secondary | ICD-10-CM | POA: Diagnosis not present

## 2022-11-26 DIAGNOSIS — E039 Hypothyroidism, unspecified: Secondary | ICD-10-CM | POA: Diagnosis not present

## 2022-11-26 DIAGNOSIS — Z9189 Other specified personal risk factors, not elsewhere classified: Secondary | ICD-10-CM | POA: Diagnosis not present

## 2022-12-03 NOTE — Progress Notes (Signed)
Remote pacemaker transmission.   

## 2022-12-25 DIAGNOSIS — E1151 Type 2 diabetes mellitus with diabetic peripheral angiopathy without gangrene: Secondary | ICD-10-CM | POA: Diagnosis not present

## 2022-12-25 DIAGNOSIS — E1140 Type 2 diabetes mellitus with diabetic neuropathy, unspecified: Secondary | ICD-10-CM | POA: Diagnosis not present

## 2022-12-25 DIAGNOSIS — L600 Ingrowing nail: Secondary | ICD-10-CM | POA: Diagnosis not present

## 2022-12-25 DIAGNOSIS — M79675 Pain in left toe(s): Secondary | ICD-10-CM | POA: Diagnosis not present

## 2022-12-25 DIAGNOSIS — M79671 Pain in right foot: Secondary | ICD-10-CM | POA: Diagnosis not present

## 2022-12-25 DIAGNOSIS — M79674 Pain in right toe(s): Secondary | ICD-10-CM | POA: Diagnosis not present

## 2022-12-25 DIAGNOSIS — M79672 Pain in left foot: Secondary | ICD-10-CM | POA: Diagnosis not present

## 2023-01-09 NOTE — Progress Notes (Signed)
Referring Provider: Richardean Chimera, MD Primary Care Physician:  Richardean Chimera, MD Primary Gastroenterologist:  Dr. Marletta Lor  Chief Complaint  Patient presents with   Diarrhea    HPI:   Alicia [name] is a 77 y.o. female presenting today at the request of Richardean Chimera, MD for diarrhea.  Reviewed referral information.  At her office visit on 11/26/2022 with PCP, patient was reporting postprandial diarrhea.  She is advised to start align daily.  Labs on 7/17 with TSH within normal limits.  Electrolytes within normal limits.  IgE Alpha gal and celiac screen negative with Dr. Reuel Boom on 7/22 [facility]).   Today:  Chronic diarrhea  for years. Was intermittent. Once every blue moon. Started getting worse about 2 months ago. Saw Dr. Garner Nash in July and was advised to start Align and to stop using sugar free products. This hasn't helped.   Over the last 2 weeks, she has had 3 bad events. Doesn't have sphincter muscle. States this was removed when she had her gallbladder out?[name] She will feel her stomach do a "flip" and she can't get to the bathroom in time. Stool will be running out before she can get to the bathroom. Just has the 1 BM, then symptoms resolve. Between these episodes, her bowel movements are "regular". No abdominal pain. No brbpr or melena. No unintentional weight loss. She does feel tired/fatigued. Had 2 episodes of vomiting in the last couple of weeks which was strange because she doesn't usually have any vomiting. No current symptoms.   Was previously prescribed dicyclomine in  2022 but this caused dizziness and fatigue.   Has tried cutting out different foods, cheese, salad, but no improvement.  Recently she had salad and bread at [employer] and had an episode.  Eats a lot of grilled chicken and vegetables, salads. Not much fried foods.   Hx of endometrial cancer.  Had radiation and the intermittent diarrhea started thereafter.   No recent medication changes/new medications.   No recent antibiotics. She is under a lot of stress recently.   Reports colonoscopy years ago with Dr. Karilyn Cota. No record of this in the system.    Past Medical History:  Diagnosis Date   Anemia    hx of   Anxiety    Cancer ([facility])    endometrial cancer/ skin cancers removed nose non melanoma   Diabetes ([facility])    Type 2   DJD (degenerative joint disease)    Dysrhythmia    SYMPTOMATIC BRADYCARDIA  / Afib   Family history of breast cancer    Family history of cancer    Gallstones    History of kidney stones    Hx of    History of radiation therapy 04/04/17-04/25/17   vaginal cuff 30 Gy in 5 fractions   Hypertension    HCTZ   Hypothyroidism    Obesity    Pacemaker 12/15/2012   DUAL CHAMBER    DR Ladona Ridgel   Sleep apnea    No machine Cant tolerate   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   Wears glasses     Past Surgical History:  Procedure Laterality Date   ANKLE SURGERY Right    multiple   BREAST SURGERY     biopsy L breast benign   CHOLECYSTECTOMY     COLONOSCOPY     EYE SURGERY     both cataracts   FOOT ARTHROTOMY     left   FOOT SURGERY  Bil  bone spurs /Left  foot neuroma removed Right foot sinus tarside surgery Right bone in toe removed   KNEE SURGERY     right   PACEMAKER INSERTION  12/15/2012   SJM 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 by Dr Ladona Ridgel for [facility] 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   PERMANENT PACEMAKER INSERTION N/A 12/15/2012   SJM 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 by Dr Ladona Ridgel for transient AV block   ROBOTIC ASSISTED TOTAL HYSTERECTOMY WITH BILATERAL SALPINGO OOPHERECTOMY N/A 02/19/2017   Procedure: XI ROBOTIC ASSISTED TOTAL HYSTERECTOMY WITH BILATERAL SALPINGO OOPHORECTOMY;  Surgeon: Adolphus Birchwood, MD;  [facility];  Service: Gynecology;  Laterality: N/A;   SENTINEL NODE BIOPSY N/A 02/19/2017   Procedure: SENTINEL NODE BIOPSY;  Surgeon: Adolphus Birchwood, MD;  [facility];  Service: Gynecology;  Laterality: N/A;   SHOULDER ARTHROSCOPY WITH SUBACROMIAL  DECOMPRESSION AND OPEN ROTATOR C Right 07/07/2013   Procedure: RIGHT SHOULDER ARTHROSCOPY WITH SUBACROMIAL DECOMPRESSION AND ROTATOR CUFF DEBRIDEMENT/ DISTAL CLAVICLE RESECTION;  Surgeon: Wyn Forster., MD;  Location: [facility];  Service: Orthopedics;  Laterality: Right;   SHOULDER SURGERY Right 2012    Current Outpatient Medications  Medication Sig Dispense Refill   apixaban (ELIQUIS) 5 MG TABS tablet Take 1 tablet (5 mg total) by mouth 2 (two) times daily. 60 tablet 11   citalopram (CELEXA) 10 MG tablet Take 10 mg by mouth daily. Mon, Wed, Fri     colestipol (COLESTID) 1 g tablet Take 2 tablets (2 g total) by mouth daily. Take other medications 1 hour before or 4 hours after colestipol. 60 tablet 3   glipiZIDE (GLUCOTROL XL) 2.5 MG 24 hr tablet Take 2.5 mg by mouth daily with breakfast.     hydrochlorothiazide (HYDRODIURIL) 25 MG tablet Take 25 mg by mouth daily.      insulin NPH-regular Human (NOVOLIN 70/30) (70-30) 100 UNIT/ML injection Inject 50 Units into the skin 2 (two) times daily with a meal.     INSULIN SYRINGE 1CC/29G 29G X 1/2" 1 ML MISC      levothyroxine (SYNTHROID) 112 MCG tablet TAKE 1 TABLET BY MOUTH ONCE DAILY BEFORE BREAKFAST 90 tablet 3   loratadine (CLARITIN) 10 MG tablet Take 10 mg by mouth daily.     Misc Natural Products (ESSIAC TONIC) CAPS Take 2 capsules by mouth 2 (two) times daily.     Polyethyl Glycol-Propyl Glycol (SYSTANE) 0.4-0.3 % SOLN Apply to eye.     Probiotic Product (PROBIOTIC DAILY PO) Take 1 tablet by mouth daily.     No current facility-administered medications for this visit.    Allergies as of 01/10/2023 - Review Complete 01/10/2023  Allergen Reaction Noted   Ace inhibitors Cough 10/20/2013   Cardizem [diltiazem hcl] Other (See Comments) 10/20/2013   Misc. sulfonamide containing compounds Other (See Comments) 05/25/2022   Oxycodone Nausea Only 12/11/2016   Statins Other (See Comments) 10/20/2013   Sulfa antibiotics Hives  and Swelling 12/13/2012   Tape  12/13/2012   Aspirin Rash 06/02/2019   Cortisone Rash 12/24/2012   Fd&c yellow #5 (tartrazine) Rash 12/11/2016   Latex Rash 12/13/2012    Family History  Problem Relation Age of Onset   Sudden death Brother        age 12s, died while playing raquetball   Stroke Mother    Cancer Father        throat   Tongue cancer Father    Heart disease Maternal Grandmother    Cancer Maternal Aunt  type unk,hx smoking   Cancer Maternal Uncle        type unk, hx smok   Cancer Paternal Aunt 49       thinks it was GI/colon- had hole in side and colostomy bag   Cancer Maternal Aunt        type unk, had a colostomy, hx smoking   Cancer Maternal Uncle        cancer maybe-?type unk   Throat cancer Maternal Uncle        hx smoking   Heart attack Maternal Uncle 50   Cancer Cousin        type unk- died in 55's/50's   Cancer Cousin        type unk, died in 40's/50's   Breast cancer Cousin        x2   Other Other 25       brain tumor   Allergic rhinitis Neg Hx    Angioedema Neg Hx    Asthma Neg Hx    Atopy Neg Hx    Eczema Neg Hx    Immunodeficiency Neg Hx    Urticaria Neg Hx     Social History   Socioeconomic History   Marital status: Married    Spouse name: Not on file   Number of children: 2   Years of education: Not on file   Highest education level: Not on file  Occupational History   Occupation: retired  Tobacco Use   Smoking status: Never   Smokeless tobacco: Never  Vaping Use   Vaping status: Never Used  Substance and Sexual Activity   Alcohol use: No    Alcohol/week: 0.0 standard drinks of alcohol   Drug use: No   Sexual activity: Not Currently    Birth control/protection: Post-menopausal  Other Topics Concern   Not on file  Social History Narrative   Retired and lives in [place] with spouse   Social Determinants of Health   Financial Resource Strain: Not on file  Food Insecurity: Not on file  Transportation Needs: Not on  file  Physical Activity: Not on file  Stress: Not on file  Social Connections: Not on file  Intimate Partner Violence: Not on file    Review of Systems: Gen: Denies any fever, chills, cold or flulike symptoms, presyncope, syncope. CV: Denies chest pain, heart palpitations. Resp: Denies shortness of breath, cough.  GI: See HPI GU : Denies urinary burning, urinary frequency, urinary hesitancy MS: Denies joint pain. Derm: Denies rash. Psych: Denies depression, anxiety. Heme: See HPI  Physical Exam: BP (!) 149/68 (BP Location: Right Arm, Patient Position: Sitting, Cuff Size: Large)   Pulse 62   Temp 97.6 F (36.4 C) (Oral)   Ht 5\' 6"  (1.676 m)   Wt 234 lb (106.1 kg)   SpO2 98%   BMI 37.77 kg/m  General:   Alert and oriented. Pleasant and cooperative. Well-nourished and well-developed.  Head:  Normocephalic and atraumatic. Eyes:  Without icterus, sclera clear and conjunctiva pink.  Ears:  Normal auditory acuity. Lungs:  Clear to auscultation bilaterally. No wheezes, rales, or rhonchi. No distress.  Heart:  S1, S2 present without murmurs appreciated.  Abdomen:  +BS, soft, non-tender and non-distended. No HSM noted. No guarding or rebound. No masses appreciated.  Rectal:  Deferred  Msk:  Symmetrical without gross deformities. Normal posture. Extremities:  Without edema. Neurologic:  Alert and  oriented x4;  grossly normal neurologically. Skin:  Intact without significant lesions or rashes. Psych: Normal  mood and affect.    Assessment:  77 year old female with medical history of endometrial cancer, diabetes, atrial fibrillation on Eliquis, heart block with pacemaker, HTN, hypothyroidism, sleep apnea, presenting today for further evaluation of intermittent diarrhea.  Diarrhea: Chronic, intermittent diarrhea since undergoing radiation therapy for endometrial cancer several years ago.  Symptoms were infrequent, but have been increasing in frequency over the last couple of weeks  with a total of 3 episodes in the last 2 weeks which she describes as an urgent, diarrhea type stool with inability to make it to the bathroom in time.  Will have 1 bowel movement and symptoms resolved with bowel movements returning to normal thereafter.  She has not identified any specific triggers though she last recalls an episode after eating at [employer] (salad and bread sticks).  She does have a history of cholecystectomy.  Query bile salt diarrhea. Could have presence of radiation proctitis though she has no BRBPR.  Also denies abdominal pain,melena, unintentional weight loss.  Admits to increased stress recently which could also be playing a role.  No real risk factors for infectious etiology and symptoms are not consistent with this.  She has not had any medication changes. Recent labs with PCP has shown normal TSH, electrolytes, negative celiac screen and alpha gal.  Reports previously trying dicyclomine in 2022, but this caused dizziness and fatigue.  I will plan to start her on Colestid 2 g daily for bile salt diarrhea.  I will also check a fecal elastase for possible pancreatic insufficiency and CBC as this was not included on recent labs with PCP and patient is reporting fatigue.  Will also schedule her for 1 more colonoscopy for screening purposes as it has been more than 10 years since her last colonoscopy and she would like to undergo 1 more.  This will also help to evaluate her diarrhea.   Plan:  CBC, fecal elastase Proceed with colonoscopy with propofol by Dr. Marletta Lor in near future. The risks, benefits, and alternatives have been discussed with the patient in detail. The patient states understanding and desires to proceed.  ASA 3 We will get the okay to hold Eliquis for 2 days prior to procedure from cardiology. 1 day prior: Take glipizide as prescribed, use mealtime insulin as needed. No morning diabetes medications. Start Colestid 2 g daily.  Advised other medication should be  taken 1 hour before or 4 hours after Colestid.  Hold Colestid in the setting of constipation. Follow-up after colonoscopy.   Ermalinda Memos, PA-C [facility] 01/10/2023

## 2023-01-10 ENCOUNTER — Ambulatory Visit (INDEPENDENT_AMBULATORY_CARE_PROVIDER_SITE_OTHER): Payer: Medicare Other | Admitting: Gastroenterology

## 2023-01-10 ENCOUNTER — Encounter: Payer: Self-pay | Admitting: Gastroenterology

## 2023-01-10 VITALS — BP 149/68 | HR 62 | Temp 97.6°F | Ht 66.0 in | Wt 234.0 lb

## 2023-01-10 DIAGNOSIS — R197 Diarrhea, unspecified: Secondary | ICD-10-CM | POA: Diagnosis not present

## 2023-01-10 DIAGNOSIS — R5383 Other fatigue: Secondary | ICD-10-CM

## 2023-01-10 MED ORDER — COLESTIPOL HCL 1 G PO TABS: 2.0000 g | ORAL_TABLET | Freq: Every day | ORAL | 3 refills | Status: DC

## 2023-01-10 NOTE — Patient Instructions (Addendum)
Please have blood work completed at Dr. Rosann Auerbach office.  Start colestipol 2 g daily.  Other medication should be taken 1 hour before or 4 hours after colestipol.  If you develop constipation, hold the medication and let me know.  We will schedule you to have a colonoscopy with Dr. Marletta Lor in the near future at [facility]. We are reaching out to your cardiologist to get approval to hold Eliquis 2 days prior to your procedure. 1 day prior to procedure: Take glipizide as prescribed, take meal time insulin as needed.  Day of your procedure: Do not take any morning diabetes medications.  We will follow-up with you in the office after your colonoscopy.  It was nice to meet you today!  Ermalinda Memos, PA-C [facility] Gastroenter[facility]

## 2023-01-11 ENCOUNTER — Telehealth: Payer: Self-pay | Admitting: *Deleted

## 2023-01-11 NOTE — Telephone Encounter (Signed)
1st attempt at calling to schedule tele preop. lvmtrc

## 2023-01-11 NOTE — Telephone Encounter (Signed)
  Request for patient to stop medication prior to procedure or is needing cleareance  01/11/23  Alicia [name] 07/18/1945  What type of surgery is being performed? Colonoscopy  When is surgery scheduled? TBD  What type of clearance is required (medical or pharmacy to hold medication or both? Medication  Are there any medications that need to be held prior to surgery and how long? Eliquis x 2 days  Name of physician performing surgery?  Dr. Verl Bangs [facility]astroenter[facility] at [facility] Phone: [phone] Fax: [phone]  Anethesia type (none, local, MAC, general)? MAC

## 2023-01-11 NOTE — Telephone Encounter (Signed)
Patient with diagnosis of afib on Eliquis for anticoagulation.    Procedure: Colonoscopy  Date of procedure: TBD   CHA2DS2-VASc Score = 5   This indicates a 7.2% annual risk of stroke. The patient's score is based upon: CHF History: 0 HTN History: 1 Diabetes History: 1 Stroke History: 0 Vascular Disease History: 0 Age Score: 2 Gender Score: 1     CrCl 48 mL/min  Platelet count - 287 (02/23/2022)    Per office protocol, patient can hold Eliquis for 2 days prior to procedure.     **This guidance is not considered finalized until pre-operative APP has relayed final recommendations.**

## 2023-01-11 NOTE — Telephone Encounter (Signed)
Pharmacy please advise on holding Eliquis prior to Colonoscopy scheduled for TBD. Thank you.

## 2023-01-11 NOTE — Telephone Encounter (Signed)
   Name: Alicia [name]  DOB: [birth year]  MRN: [id number]  Primary Cardiologist: None   Preoperative team, please contact this patient and set up a phone call appointment for further preoperative risk assessment. Please obtain consent and complete medication review. Thank you for your help.  I confirm that guidance regarding antiplatelet and oral anticoagulation therapy has been completed and, if necessary, noted below.  Per Pharmacy CHA2DS2-VASc Score = 5   This indicates a 7.2% annual risk of stroke. The patient's score is based upon: CHF History: 0 HTN History: 1 Diabetes History: 1 Stroke History: 0 Vascular Disease History: 0 Age Score: 2 Gender Score: 1       CrCl 48 mL/min  Platelet count - 287 (02/23/2022)       Per office protocol, patient can hold Eliquis for 2 days prior to procedure.     Joni Reining, NP 01/11/2023, 1:07 PM [facility]

## 2023-01-13 ENCOUNTER — Encounter: Payer: Self-pay | Admitting: Gastroenterology

## 2023-01-14 NOTE — Telephone Encounter (Signed)
I confirmed with the Mindy at Dr. Queen Blossom office, if just needing pharmacy clearance as noted in the request or if needing cardiac clearance as well.   ME: good morning Mindy. I am just wanting to confirm if Dr. Marletta Lor only wants clearance for blood thinner or does he need cardiac clearance as well?   Mindy:correct just states get okay to hold eliquis   Me: ok ty for the help. I was asking b/c my preop APP wants a tele appt, but let update her. We may not need to do tele appt since just needing pharm clearance.    I will forward to preop APP as to update needing only clearance for blood thinner. I will confirm if tele app will still be needed.

## 2023-01-14 NOTE — Telephone Encounter (Signed)
   Patient Name: Alicia [name]  DOB: 1945-11-02 MRN: [id number]  Primary Cardiologist: None  Chart reviewed as part of pre-operative protocol coverage. Given past medical history and time since last visit, based on ACC/AHA guidelines, SANAE FILO is at acceptable risk for the planned procedure without further cardiovascular testing. This is a pharmacy request only.   CHA2DS2-VASc Score = 5   This indicates a 7.2% annual risk of stroke. The patient's score is based upon: CHF History: 0 HTN History: 1 Diabetes History: 1 Stroke History: 0 Vascular Disease History: 0 Age Score: 2 Gender Score: 1     CrCl 48 mL/min  Platelet count - 287 (02/23/2022)     Per office protocol, patient can hold Eliquis for 2 days prior to procedure.  Please call with questions.  Joni Reining, NP 01/14/2023, 11:08 AM

## 2023-01-15 DIAGNOSIS — R197 Diarrhea, unspecified: Secondary | ICD-10-CM | POA: Diagnosis not present

## 2023-01-16 DIAGNOSIS — H04123 Dry eye syndrome of bilateral lacrimal glands: Secondary | ICD-10-CM | POA: Diagnosis not present

## 2023-01-16 DIAGNOSIS — H524 Presbyopia: Secondary | ICD-10-CM | POA: Diagnosis not present

## 2023-01-16 DIAGNOSIS — E113291 Type 2 diabetes mellitus with mild nonproliferative diabetic retinopathy without macular edema, right eye: Secondary | ICD-10-CM | POA: Diagnosis not present

## 2023-01-16 DIAGNOSIS — L719 Rosacea, unspecified: Secondary | ICD-10-CM | POA: Diagnosis not present

## 2023-01-16 DIAGNOSIS — H353132 Nonexudative age-related macular degeneration, bilateral, intermediate dry stage: Secondary | ICD-10-CM | POA: Diagnosis not present

## 2023-01-16 LAB — CBC WITH DIFFERENTIAL/PLATELET
Basophils Absolute: 0.1 10*3/uL (ref 0.0–0.2)
Basos: 1 %
EOS (ABSOLUTE): 0.3 10*3/uL (ref 0.0–0.4)
Eos: 4 %
Hematocrit: 36 % (ref 34.0–46.6)
Hemoglobin: 12 g/dL (ref 11.1–15.9)
Immature Grans (Abs): 0 10*3/uL (ref 0.0–0.1)
Immature Granulocytes: 1 %
Lymphocytes Absolute: 1.8 10*3/uL (ref 0.7–3.1)
Lymphs: 28 %
MCH: 33.6 pg — ABNORMAL HIGH (ref 26.6–33.0)
MCHC: 33.3 g/dL (ref 31.5–35.7)
MCV: 101 fL — ABNORMAL HIGH (ref 79–97)
Monocytes Absolute: 0.5 10*3/uL (ref 0.1–0.9)
Monocytes: 7 %
Neutrophils Absolute: 3.9 10*3/uL (ref 1.4–7.0)
Neutrophils: 59 %
Platelets: 314 10*3/uL (ref 150–450)
RBC: 3.57 x10E6/uL — ABNORMAL LOW (ref 3.77–5.28)
RDW: 13.3 % (ref 11.7–15.4)
WBC: 6.6 10*3/uL (ref 3.4–10.8)

## 2023-01-17 ENCOUNTER — Other Ambulatory Visit: Payer: Self-pay | Admitting: *Deleted

## 2023-01-17 DIAGNOSIS — R197 Diarrhea, unspecified: Secondary | ICD-10-CM

## 2023-01-17 DIAGNOSIS — D7589 Other specified diseases of blood and blood-forming organs: Secondary | ICD-10-CM

## 2023-01-17 NOTE — Addendum Note (Signed)
Addended by: Faythe Dingwall on: 01/17/2023 09:13 AM   Modules accepted: Orders

## 2023-01-18 DIAGNOSIS — D7589 Other specified diseases of blood and blood-forming organs: Secondary | ICD-10-CM | POA: Diagnosis not present

## 2023-01-19 ENCOUNTER — Other Ambulatory Visit: Payer: Self-pay | Admitting: Gastroenterology

## 2023-01-19 DIAGNOSIS — E538 Deficiency of other specified B group vitamins: Secondary | ICD-10-CM

## 2023-01-19 LAB — B12 AND FOLATE PANEL
Folate: 2.2 ng/mL — ABNORMAL LOW (ref >3.0)
Vitamin B-12: 937 pg/mL (ref 232–1245)

## 2023-01-19 MED ORDER — FOLIC ACID 1 MG PO TABS: 1.0000 mg | ORAL_TABLET | Freq: Every day | ORAL | 0 refills | Status: DC

## 2023-01-23 NOTE — Telephone Encounter (Signed)
Patient cleared to hold Eliquis x 2 days prior to procedure. Please proceed with scheduling.

## 2023-01-24 NOTE — Telephone Encounter (Signed)
Called pt, no answer. Unable to leave VM.

## 2023-01-28 NOTE — Telephone Encounter (Signed)
Patient called back and requests November. Advised will call back with that schedule once we receive it

## 2023-01-28 NOTE — Telephone Encounter (Signed)
LMOVM to call back for pt ?

## 2023-02-01 ENCOUNTER — Other Ambulatory Visit: Payer: Self-pay | Admitting: Gastroenterology

## 2023-02-01 DIAGNOSIS — R197 Diarrhea, unspecified: Secondary | ICD-10-CM | POA: Diagnosis not present

## 2023-02-04 ENCOUNTER — Encounter: Payer: Self-pay | Admitting: *Deleted

## 2023-02-04 MED ORDER — NA SULFATE-K SULFATE-MG SULF 17.5-3.13-1.6 GM/177ML PO SOLN: ORAL | 0 refills | Status: DC

## 2023-02-05 LAB — PANCREATIC ELASTASE, FECAL: Pancreatic Elastase, Fecal: 320 ug Elast./g (ref >200)

## 2023-02-08 ENCOUNTER — Ambulatory Visit: Payer: Medicare Other | Admitting: Obstetrics & Gynecology

## 2023-02-18 ENCOUNTER — Encounter: Payer: Self-pay | Admitting: Obstetrics & Gynecology

## 2023-02-18 ENCOUNTER — Ambulatory Visit (INDEPENDENT_AMBULATORY_CARE_PROVIDER_SITE_OTHER): Payer: Medicare Other | Admitting: Obstetrics & Gynecology

## 2023-02-18 ENCOUNTER — Ambulatory Visit (INDEPENDENT_AMBULATORY_CARE_PROVIDER_SITE_OTHER): Payer: Medicare Other

## 2023-02-18 VITALS — BP 139/62 | HR 80 | Ht 67.0 in | Wt 235.0 lb

## 2023-02-18 DIAGNOSIS — I441 Atrioventricular block, second degree: Secondary | ICD-10-CM | POA: Diagnosis not present

## 2023-02-18 DIAGNOSIS — Z01419 Encounter for gynecological examination (general) (routine) without abnormal findings: Secondary | ICD-10-CM

## 2023-02-18 DIAGNOSIS — Z8542 Personal history of malignant neoplasm of other parts of uterus: Secondary | ICD-10-CM

## 2023-02-18 NOTE — Progress Notes (Signed)
Subjective:     Alicia [name] is a 77 y.o. female here for a routine exam.  No LMP recorded. Patient has had a hysterectomy. [id number] Birth Control Method:  hysterectomy Menstrual Calendar(currently): na  Current complaints: had spotting recently, has had some episodes over the past few years after intra cavitary radiation.   Current acute medical issues:  none   Recent Gynecologic History No LMP recorded. Patient has had a hysterectomy. Last Pap: 2018,   Last mammogram: 2006 -->with pacemeaker patient has declined mammography  Past Medical History:  Diagnosis Date   Anemia    hx of   Anxiety    Cancer (HCC)    endometrial cancer/ skin cancers removed nose non melanoma   Diabetes (HCC)    Type 2   DJD (degenerative joint disease)    Dysrhythmia    SYMPTOMATIC BRADYCARDIA  / Afib   Family history of breast cancer    Family history of cancer    Gallstones    History of kidney stones    Hx of    History of radiation therapy 04/04/17-04/25/17   vaginal cuff 30 Gy in 5 fractions   Hypertension    HCTZ   Hypothyroidism    Obesity    Pacemaker 12/15/2012   DUAL CHAMBER    DR Ladona Ridgel   Sleep apnea    No machine Cant tolerate   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   Wears glasses     Past Surgical History:  Procedure Laterality Date   ANKLE SURGERY Right    multiple   BREAST SURGERY     biopsy L breast benign   CHOLECYSTECTOMY     COLONOSCOPY     EYE SURGERY     both cataracts   FOOT ARTHROTOMY     left   FOOT SURGERY     Bil  bone spurs /Left  foot neuroma removed Right foot sinus tarside surgery Right bone in toe removed   KNEE SURGERY     right   PACEMAKER INSERTION  12/15/2012   SJM 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 by 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   PERMANENT PACEMAKER INSERTION N/A 12/15/2012   SJM 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 by Dr Ladona Ridgel for transient AV block   ROBOTIC ASSISTED TOTAL HYSTERECTOMY WITH BILATERAL SALPINGO OOPHERECTOMY N/A  02/19/2017   Procedure: XI ROBOTIC ASSISTED TOTAL HYSTERECTOMY WITH BILATERAL SALPINGO OOPHORECTOMY;  Surgeon: Adolphus Birchwood, MD;  Location: [facility];  Service: Gynecology;  Laterality: N/A;   SENTINEL NODE BIOPSY N/A 02/19/2017   Procedure: SENTINEL NODE BIOPSY;  Surgeon: Adolphus Birchwood, MD;  Location: [facility];  Service: Gynecology;  Laterality: N/A;   SHOULDER ARTHROSCOPY WITH SUBACROMIAL DECOMPRESSION AND OPEN ROTATOR C Right 07/07/2013   Procedure: RIGHT SHOULDER ARTHROSCOPY WITH SUBACROMIAL DECOMPRESSION AND ROTATOR CUFF DEBRIDEMENT/ DISTAL CLAVICLE RESECTION;  Surgeon: Wyn Forster., MD;  Location: [facility];  Service: Orthopedics;  Laterality: Right;   SHOULDER SURGERY Right 2012    OB History     Gravida  2   Para  2   Term  1   Preterm  1   AB      Living  2      SAB      IAB      Ectopic      Multiple      Live Births  2           Social History   Socioeconomic History  Marital status: Married    Spouse name: Not on file   Number of children: 2   Years of education: Not on file   Highest education level: Not on file  Occupational History   Occupation: retired  Tobacco Use   Smoking status: Never   Smokeless tobacco: Never  Vaping Use   Vaping status: Never Used  Substance and Sexual Activity   Alcohol use: No    Alcohol/week: 0.0 standard drinks of alcohol   Drug use: No   Sexual activity: Not Currently    Birth control/protection: Post-menopausal  Other Topics Concern   Not on file  Social History Narrative   Retired and lives in [place] with spouse   Social Determinants of Corporate investment banker Strain: Not on file  Food Insecurity: Not on file  Transportation Needs: Not on file  Physical Activity: Not on file  Stress: Not on file  Social Connections: Not on file    Family History  Problem Relation Age of Onset   Sudden death Brother        age 80s, died while playing raquetball   Stroke Mother    Cancer  Father        throat   Tongue cancer Father    Heart disease Maternal Grandmother    Cancer Maternal Aunt        type unk,hx smoking   Cancer Maternal Uncle        type unk, hx smok   Cancer Paternal Aunt 71       thinks it was GI/colon- had hole in side and colostomy bag   Cancer Maternal Aunt        type unk, had a colostomy, hx smoking   Cancer Maternal Uncle        cancer maybe-?type unk   Throat cancer Maternal Uncle        hx smoking   Heart attack Maternal Uncle 50   Cancer Cousin        type unk- died in 52's/50's   Cancer Cousin        type unk, died in 14's/50's   Breast cancer Cousin        x2   Other Other 25       brain tumor   Allergic rhinitis Neg Hx    Angioedema Neg Hx    Asthma Neg Hx    Atopy Neg Hx    Eczema Neg Hx    Immunodeficiency Neg Hx    Urticaria Neg Hx      Current Outpatient Medications:    apixaban (ELIQUIS) 5 MG TABS tablet, Take 1 tablet (5 mg total) by mouth 2 (two) times daily., Disp: 60 tablet, Rfl: 11   citalopram (CELEXA) 10 MG tablet, Take 10 mg by mouth daily. Mon, Wed, Fri, Disp: , Rfl:    folic acid (FOLVITE) 1 MG tablet, Take 1 tablet (1 mg total) by mouth daily., Disp: 30 tablet, Rfl: 0   glipiZIDE (GLUCOTROL XL) 2.5 MG 24 hr tablet, Take 2.5 mg by mouth daily with breakfast., Disp: , Rfl:    hydrochlorothiazide (HYDRODIURIL) 25 MG tablet, Take 25 mg by mouth daily. , Disp: , Rfl:    insulin NPH-regular Human (NOVOLIN 70/30) (70-30) 100 UNIT/ML injection, Inject 50 Units into the skin 2 (two) times daily with a meal., Disp: , Rfl:    INSULIN SYRINGE 1CC/29G 29G X 1/2" 1 ML MISC, , Disp: , Rfl:    levothyroxine (SYNTHROID) 112 MCG tablet,  TAKE 1 TABLET BY MOUTH ONCE DAILY BEFORE BREAKFAST, Disp: 90 tablet, Rfl: 3   loratadine (CLARITIN) 10 MG tablet, Take 10 mg by mouth daily., Disp: , Rfl:    Misc Natural Products (ESSIAC TONIC) CAPS, Take 2 capsules by mouth 2 (two) times daily., Disp: , Rfl:    Na Sulfate-K Sulfate-Mg Sulf  17.5-3.13-1.6 GM/177ML SOLN, As directed, Disp: 354 mL, Rfl: 0   Polyethyl Glycol-Propyl Glycol (SYSTANE) 0.4-0.3 % SOLN, Apply to eye., Disp: , Rfl:    Probiotic Product (PROBIOTIC DAILY PO), Take 1 tablet by mouth daily., Disp: , Rfl:   Review of Systems  Review of Systems  Constitutional: Negative for fever, chills, weight loss, malaise/fatigue and diaphoresis.  HENT: Negative for hearing loss, ear pain, nosebleeds, congestion, sore throat, neck pain, tinnitus and ear discharge.   Eyes: Negative for blurred vision, double vision, photophobia, pain, discharge and redness.  Respiratory: Negative for cough, hemoptysis, sputum production, shortness of breath, wheezing and stridor.   Cardiovascular: Negative for chest pain, palpitations, orthopnea, claudication, leg swelling and PND.  Gastrointestinal: negative for abdominal pain. Negative for heartburn, nausea, vomiting, diarrhea, constipation, blood in stool and melena.  Genitourinary: Negative for dysuria, urgency, frequency, hematuria and flank pain.  Musculoskeletal: Negative for myalgias, back pain, joint pain and falls.  Skin: Negative for itching and rash.  Neurological: Negative for dizziness, tingling, tremors, sensory change, speech change, focal weakness, seizures, loss of consciousness, weakness and headaches.  Endo/Heme/Allergies: Negative for environmental allergies and polydipsia. Does not bruise/bleed easily.  Psychiatric/Behavioral: Negative for depression, suicidal ideas, hallucinations, memory loss and substance abuse. The patient is not nervous/anxious and does not have insomnia.        Objective:  Blood pressure 139/62, pulse 80, height 5\' 7"  (1.702 m), weight 235 lb (106.6 kg).   Physical Exam  Vitals reviewed. Constitutional: She is oriented to person, place, and time. She appears well-developed and well-nourished.  HENT:  Head: Normocephalic and atraumatic.        Right Ear: External ear normal.  Left Ear:  External ear normal.  Nose: Nose normal.  Mouth/Throat: Oropharynx is clear and moist.  Eyes: Conjunctivae and EOM are normal. Pupils are equal, round, and reactive to light. Right eye exhibits no discharge. Left eye exhibits no discharge. No scleral icterus.  Neck: Normal range of motion. Neck supple. No tracheal deviation present. No thyromegaly present.  Cardiovascular: Normal rate, regular rhythm, normal heart sounds and intact distal pulses.  Exam reveals no gallop and no friction rub.   No murmur heard. Respiratory: Effort normal and breath sounds normal. No respiratory distress. She has no wheezes. She has no rales. She exhibits no tenderness.  GI: Soft. Bowel sounds are normal. She exhibits no distension and no mass. There is no tenderness. There is no rebound and no guarding.  Genitourinary:  Breasts no masses skin changes or nipple changes bilaterally      Vulva is normal without lesions Vagina is pink moist without discharge Cervix absent Uterus is absent Adnexa is negative  Musculoskeletal: Normal range of motion. She exhibits no edema and no tenderness.  Neurological: She is alert and oriented to person, place, and time. She has normal reflexes. She displays normal reflexes. No cranial nerve deficit. She exhibits normal muscle tone. Coordination normal.  Skin: Skin is warm and dry. No rash noted. No erythema. No pallor.  Psychiatric: She has a normal mood and affect. Her behavior is normal. Judgment and thought content normal.  Medications Ordered at today's visit: No orders of the defined types were placed in this encounter.   Other orders placed at today's visit: No orders of the defined types were placed in this encounter.     Assessment:    Normal Gyn exam.   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

## 2023-02-20 DIAGNOSIS — L820 Inflamed seborrheic keratosis: Secondary | ICD-10-CM | POA: Diagnosis not present

## 2023-02-20 DIAGNOSIS — L821 Other seborrheic keratosis: Secondary | ICD-10-CM | POA: Diagnosis not present

## 2023-02-20 DIAGNOSIS — D1801 Hemangioma of skin and subcutaneous tissue: Secondary | ICD-10-CM | POA: Diagnosis not present

## 2023-02-20 DIAGNOSIS — L853 Xerosis cutis: Secondary | ICD-10-CM | POA: Diagnosis not present

## 2023-02-20 DIAGNOSIS — L814 Other melanin hyperpigmentation: Secondary | ICD-10-CM | POA: Diagnosis not present

## 2023-02-20 DIAGNOSIS — L719 Rosacea, unspecified: Secondary | ICD-10-CM | POA: Diagnosis not present

## 2023-02-20 DIAGNOSIS — L304 Erythema intertrigo: Secondary | ICD-10-CM | POA: Diagnosis not present

## 2023-02-20 LAB — CUP PACEART REMOTE DEVICE CHECK
Battery Remaining Longevity: 23 mo
Battery Remaining Percentage: 18 %
Battery Voltage: 2.87 V
Brady Statistic AP VP Percent: 1 %
Brady Statistic AP VS Percent: 51 %
Brady Statistic AS VP Percent: 1 %
Brady Statistic AS VS Percent: 47 %
Brady Statistic RA Percent Paced: 51 %
Brady Statistic RV Percent Paced: 1.5 %
Date Time Interrogation Session: 20241014020015
Implantable Lead Connection Status: 753985
Implantable Lead Connection Status: 753985
Implantable Lead Implant Date: 20140811
Implantable Lead Implant Date: 20140811
Implantable Lead Location: 753859
Implantable Lead Location: 753860
Implantable Pulse Generator Implant Date: 20140811
Lead Channel Impedance Value: 360 Ohm
Lead Channel Impedance Value: 430 Ohm
Lead Channel Pacing Threshold Amplitude: 0.375 V
Lead Channel Pacing Threshold Amplitude: 0.75 V
Lead Channel Pacing Threshold Pulse Width: 0.4 ms
Lead Channel Pacing Threshold Pulse Width: 0.4 ms
Lead Channel Sensing Intrinsic Amplitude: 12 mV
Lead Channel Sensing Intrinsic Amplitude: 3.1 mV
Lead Channel Setting Pacing Amplitude: 1 V
Lead Channel Setting Pacing Amplitude: 1.375
Lead Channel Setting Pacing Pulse Width: 0.4 ms
Lead Channel Setting Sensing Sensitivity: 2 mV
Pulse Gen Model: 2240
Pulse Gen Serial Number: 7528878

## 2023-02-21 DIAGNOSIS — M1711 Unilateral primary osteoarthritis, right knee: Secondary | ICD-10-CM | POA: Diagnosis not present

## 2023-02-25 DIAGNOSIS — E1140 Type 2 diabetes mellitus with diabetic neuropathy, unspecified: Secondary | ICD-10-CM | POA: Diagnosis not present

## 2023-02-25 DIAGNOSIS — M79672 Pain in left foot: Secondary | ICD-10-CM | POA: Diagnosis not present

## 2023-02-25 DIAGNOSIS — M79671 Pain in right foot: Secondary | ICD-10-CM | POA: Diagnosis not present

## 2023-02-28 DIAGNOSIS — M1711 Unilateral primary osteoarthritis, right knee: Secondary | ICD-10-CM | POA: Diagnosis not present

## 2023-03-05 DIAGNOSIS — M79671 Pain in right foot: Secondary | ICD-10-CM | POA: Diagnosis not present

## 2023-03-05 DIAGNOSIS — M79675 Pain in left toe(s): Secondary | ICD-10-CM | POA: Diagnosis not present

## 2023-03-05 DIAGNOSIS — L600 Ingrowing nail: Secondary | ICD-10-CM | POA: Diagnosis not present

## 2023-03-05 DIAGNOSIS — E1151 Type 2 diabetes mellitus with diabetic peripheral angiopathy without gangrene: Secondary | ICD-10-CM | POA: Diagnosis not present

## 2023-03-05 DIAGNOSIS — E1140 Type 2 diabetes mellitus with diabetic neuropathy, unspecified: Secondary | ICD-10-CM | POA: Diagnosis not present

## 2023-03-05 DIAGNOSIS — M79672 Pain in left foot: Secondary | ICD-10-CM | POA: Diagnosis not present

## 2023-03-05 DIAGNOSIS — M79674 Pain in right toe(s): Secondary | ICD-10-CM | POA: Diagnosis not present

## 2023-03-05 NOTE — Progress Notes (Signed)
Remote pacemaker transmission.   

## 2023-03-05 NOTE — Patient Instructions (Signed)
Monitored Anest   Alicia [name]  03/05/2023     @[email or web address] @   Your procedure is scheduled on 03/11/23.  Report to [facility] at 0715 A.M.  Call this number if you have problems the morning of surgery:  [phone]  If you experience any cold or flu symptoms such as cough, fever, chills, shortness of breath, etc. between now and your scheduled surgery, please notify us at the above number.   Remember:  Do not eat or drink after midnight.  You may drink clear liquids until 0515 .    Clear liquids allowed are:                    Water, Juice (No red color; non-citric and without pulp; diabetics please choose diet or no sugar options), Carbonated beverages (diabetics please choose diet or no sugar options), Clear Tea (No creamer, milk, or cream, including half & half and powdered creamer), Black Coffee Only (No creamer, milk or cream, including half & half and powdered creamer), and Clear Sports drink (No red color; diabetics please choose diet or no sugar options)    Take these medicines the morning of surgery with A SIP OF WATER celexa, synthroid & claritin  DO NOT TAKE ANY INSULIN OR DIABETIC MEDICATION MORNING OF PROCEDURE  LAST DOSE OF ELIQUIS SHOULD BE 03/08/23  FOLLOW DIET & PREP INSTRUCTIONS AS PROVIDED TO YOU FROM OFFICE    Do not wear jewelry, make-up or nail polish, including gel polish,  artificial nails, or any other type of covering on natural nails (fingers and  toes).  Do not wear lotions, powders, or perfumes, or deodorant.  Do not shave 48 hours prior to surgery.  Men may shave face and neck.  Do not bring valuables to the hospital.  [facility] is not responsible for any belongings or valuables.  Contacts, dentures or bridgework may not be worn into surgery.  Leave your suitcase in the car.  After surgery it may be brought to your room.  For patients admitted to the hospital, discharge time will be determined by your treatment team.  Patients discharged  the day of surgery will not be allowed to drive home.   Name and phone number of your driver:   FAMILY Special instructions:  FOLLOW DIET & PREP INSTRUCIONS  NO SMOKING OR VAPING 24 HOURS PRIOR TO PROCEDURE  Please read over the following fact sheets that you were given. Anesthesia Post-op Instructions and Care and Recovery After Surgery      PATIENT INSTRUCTIONS POST-ANESTHESIA  IMMEDIATELY FOLLOWING SURGERY:  Do not drive or operate machinery for the first twenty four hours after surgery.  Do not make any important decisions for twenty four hours after surgery or while taking narcotic pain medications or sedatives.  If you develop intractable nausea and vomiting or a severe headache please notify your doctor immediately.  FOLLOW-UP:  Please make an appointment with your surgeon as instructed. You do not need to follow up with anesthesia unless specifically instructed to do so.  WOUND CARE INSTRUCTIONS (if applicable):  Keep a dry clean dressing on the anesthesia/puncture wound site if there is drainage.  Once the wound has quit draining you may leave it open to air.  Generally you should leave the bandage intact for twenty four hours unless there is drainage.  If the epidural site drains for more than 36-48 hours please call the anesthesia department.  QUESTIONS?:  Please feel free to call your physician or the hospital  operator if you have any questions, and they will be happy to assist you.      hesia Care, Care After The following information offers guidance on how to care for yourself after your procedure. Your health care provider may also give you more specific instructions. If you have problems or questions, contact your health care provider. What can I expect after the procedure? After the procedure, it is common to have: Tiredness. Little or no memory about what happened during or after the procedure. Impaired judgment when it comes to making decisions. Nausea or  vomiting. Some trouble with balance. Follow these instructions at home: For the time period you were told by your health care provider:  Rest. Do not participate in activities where you could fall or become injured. Do not drive or use machinery. Do not drink alcohol. Do not take sleeping pills or medicines that cause drowsiness. Do not make important decisions or sign legal documents. Do not take care of children on your own. Medicines Take over-the-counter and prescription medicines only as told by your health care provider. If you were prescribed antibiotics, take them as told by your health care provider. Do not stop using the antibiotic even if you start to feel better. Eating and drinking Follow instructions from your health care provider about what you may eat and drink. Drink enough fluid to keep your urine pale yellow. If you vomit: Drink clear fluids slowly and in small amounts as you are able. Clear fluids include water, ice chips, low-calorie sports drinks, and fruit juice that has water added to it (diluted fruit juice). Eat light and bland foods in small amounts as you are able. These foods include bananas, applesauce, rice, lean meats, toast, and crackers. General instructions  Have a responsible adult stay with you for the time you are told. It is important to have someone help care for you until you are awake and alert. If you have sleep apnea, surgery and some medicines can increase your risk for breathing problems. Follow instructions from your health care provider about wearing your sleep device: When you are sleeping. This includes during daytime naps. While taking prescription pain medicines, sleeping medicines, or medicines that make you drowsy. Do not use any products that contain nicotine or tobacco. These products include cigarettes, chewing tobacco, and vaping devices, such as e-cigarettes. If you need help quitting, ask your health care provider. Contact a  health care provider if: You feel nauseous or vomit every time you eat or drink. You feel light-headed. You are still sleepy or having trouble with balance after 24 hours. You get a rash. You have a fever. You have redness or swelling around the IV site. Get help right away if: You have trouble breathing. You have new confusion after you get home. These symptoms may be an emergency. Get help right away. Call 911. Do not wait to see if the symptoms will go away. Do not drive yourself to the hospital. This information is not intended to replace advice given to you by your health care provider. Make sure you discuss any questions you have with your health care provider. Document Revised: 09/18/2021 Document Reviewed: 09/18/2021 Elsevier Patient Education  2024 [employer] Monitored Anesthesia Care Anesthesia refers to the techniques, procedures, and medicines that help a person stay safe and comfortable during surgery. Monitored anesthesia care, or sedation, is one type of anesthesia. You may have sedation if you do not need to be asleep for your procedure. Procedures that use  sedation may include: Surgery to remove cataracts from your eyes. A dental procedure. A biopsy. This is when a tissue sample is removed and looked at under a microscope. You will be watched closely during your procedure. Your level of sedation or type of anesthesia may be changed to fit your needs. Tell a health care provider about: Any allergies you have. All medicines you are taking, including vitamins, herbs, eye drops, creams, and over-the-counter medicines. Any problems you or family members have had with anesthesia. Any bleeding problems you have. Any surgeries you have had. Any medical conditions or illnesses you have. This includes sleep apnea, cough, fever, or the flu. Whether you are pregnant or may be pregnant. Whether you use cigarettes, alcohol, or drugs. Any use of steroids, whether by mouth or as a  cream. What are the risks? Your health care provider will talk with you about risks. These may include: Getting too much medicine (oversedation). Nausea. Allergic reactions to medicines. Trouble breathing. If this happens, a breathing tube may be used to help you breathe. It will be removed when you are awake and breathing on your own. Heart trouble. Lung trouble. Confusion that gets better with time (emergence delirium). What happens before the procedure? When to stop eating and drinking Follow instructions from your health care provider about what you may eat and drink. These may include: 8 hours before your procedure Stop eating most foods. Do not eat meat, fried foods, or fatty foods. Eat only light foods, such as toast or crackers. All liquids are okay except energy drinks and alcohol. 6 hours before your procedure Stop eating. Drink only clear liquids, such as water, clear fruit juice, black coffee, plain tea, and sports drinks. Do not drink energy drinks or alcohol. 2 hours before your procedure Stop drinking all liquids. You may be allowed to take medicines with small sips of water. If you do not follow your health care provider's instructions, your procedure may be delayed or canceled. Medicines Ask your health care provider about: Changing or stopping your regular medicines. These include any diabetes medicines or blood thinners you take. Taking medicines such as aspirin and ibuprofen. These medicines can thin your blood. Do not take them unless your health care provider tells you to. Taking over-the-counter medicines, vitamins, herbs, and supplements. Testing You may have an exam or testing. You may have a blood or urine sample taken. General instructions Do not use any products that contain nicotine or tobacco for at least 4 weeks before the procedure. These products include cigarettes, chewing tobacco, and vaping devices, such as e-cigarettes. If you need help quitting,  ask your health care provider. If you will be going home right after the procedure, plan to have a responsible adult: Take you home from the hospital or clinic. You will not be allowed to drive. Care for you for the time you are told. What happens during the procedure?  Your blood pressure, heart rate, breathing, level of pain, and blood oxygen level will be monitored. An IV will be inserted into one of your veins. You may be given: A sedative. This helps you relax. Anesthesia. This will: Numb certain areas of your body. Make you fall asleep for surgery. You will be given medicines as needed to keep you comfortable. The more medicine you are given, the deeper your level of sedation will be. Your level of sedation may be changed to fit your needs. There are three levels of sedation: Mild sedation. At this level, you may  feel awake and relaxed. You will be able to follow directions. Moderate sedation. At this level, you will be sleepy. You may not remember the procedure. Deep sedation. At this level, you will be asleep. You will not remember the procedure. How you get the medicines will depend on your age and the procedure. They may be given as: A pill. This may be taken by mouth (orally) or inserted into the rectum. An injection. This may be into a vein or muscle. A spray through the nose. After your procedure is over, the medicine will be stopped. The procedure may vary among health care providers and hospitals. What happens after the procedure? Your blood pressure, heart rate, breathing rate, and blood oxygen level will be monitored until you leave the hospital or clinic. You may feel sleepy, clumsy, or nauseous. You may not remember what happened during or after the procedure. Sedation can affect you for several hours. Do not drive or use machinery until your health care provider says that it is safe. This information is not intended to replace advice given to you by your health care  provider. Make sure you discuss any questions you have with your health care provider. Document Revised: 09/18/2021 Document Reviewed: 09/18/2021 Elsevier Patient Education  2024 [employer] Colonoscopy, Adult A colonoscopy is a procedure to look at the entire large intestine. This procedure is done using a long, thin, flexible tube that has a camera on the end. You may have a colonoscopy: As a part of normal colorectal screening. If you have certain symptoms, such as: A low number of red blood cells in your blood (anemia). Diarrhea that does not go away. Pain in your abdomen. Blood in your stool. A colonoscopy can help screen for and diagnose medical problems, including: An abnormal growth of cells or tissue (tumor). Abnormal growths within the lining of your intestine (polyps). Inflammation. Areas of bleeding. Tell your health care provider about: Any allergies you have. All medicines you are taking, including vitamins, herbs, eye drops, creams, and over-the-counter medicines. Any problems you or family members have had with anesthetic medicines. Any bleeding problems you have. Any surgeries you have had. Any medical conditions you have. Any problems you have had with having bowel movements. Whether you are pregnant or may be pregnant. What are the risks? Generally, this is a safe procedure. However, problems may occur, including: Bleeding. Damage to your intestine. Allergic reactions to medicines given during the procedure. Infection. This is rare. What happens before the procedure? Eating and drinking restrictions Follow instructions from your health care provider about eating or drinking restrictions, which may include: A few days before the procedure: Follow a low-fiber diet. Avoid nuts, seeds, dried fruit, raw fruits, and vegetables. 1-3 days before the procedure: Eat only gelatin dessert or ice pops. Drink only clear liquids, such as water, clear juice, clear broth  or bouillon, black coffee or tea, or clear soft drinks or sports drinks. Avoid liquids that contain red or purple dye. The day of the procedure: Do not eat solid foods. You may continue to drink clear liquids until up to 2 hours before the procedure. Do not eat or drink anything starting 2 hours before the procedure, or within the time period that your health care provider recommends. Bowel prep If you were prescribed a bowel prep to take by mouth (orally) to clean out your colon: Take it as told by your health care provider. Starting the day before your procedure, you will need to drink a  large amount of liquid medicine. The liquid will cause you to have many bowel movements of loose stool until your stool becomes almost clear or light green. If your skin or the opening between the buttocks (anus) gets irritated from diarrhea, you may relieve the irritation using: Wipes with medicine in them, such as adult wet wipes with aloe and vitamin E. A product to soothe skin, such as petroleum jelly. If you vomit while drinking the bowel prep: Take a break for up to 60 minutes. Begin the bowel prep again. Call your health care provider if you keep vomiting or you cannot take the bowel prep without vomiting. To clean out your colon, you may also be given: Laxative medicines. These help you have a bowel movement. Instructions for enema use. An enema is liquid medicine injected into your rectum. Medicines Ask your health care provider about: Changing or stopping your regular medicines or supplements. This is especially important if you are taking iron supplements, diabetes medicines, or blood thinners. Taking medicines such as aspirin and ibuprofen. These medicines can thin your blood. Do not take these medicines unless your health care provider tells you to take them. Taking over-the-counter medicines, vitamins, herbs, and supplements. General instructions Ask your health care provider what steps will  be taken to help prevent infection. These may include washing skin with a germ-killing soap. If you will be going home right after the procedure, plan to have a responsible adult: Take you home from the hospital or clinic. You will not be allowed to drive. Care for you for the time you are told. What happens during the procedure?  An IV will be inserted into one of your veins. You will be given a medicine to make you fall asleep (general anesthetic). You will lie on your side with your knees bent. A lubricant will be put on the tube. Then the tube will be: Inserted into your anus. Gently eased through all parts of your large intestine. Air will be sent into your colon to keep it open. This may cause some pressure or cramping. Images will be taken with the camera and will appear on a screen. A small tissue sample may be removed to be looked at under a microscope (biopsy). The tissue may be sent to a lab for testing if any signs of problems are found. If small polyps are found, they may be removed and checked for cancer cells. When the procedure is finished, the tube will be removed. The procedure may vary among health care providers and hospitals. What happens after the procedure? Your blood pressure, heart rate, breathing rate, and blood oxygen level will be monitored until you leave the hospital or clinic. You may have a small amount of blood in your stool. You may pass gas and have mild cramping or bloating in your abdomen. This is caused by the air that was used to open your colon during the exam. If you were given a sedative during the procedure, it can affect you for several hours. Do not drive or operate machinery until your health care provider says that it is safe. It is up to you to get the results of your procedure. Ask your health care provider, or the department that is doing the procedure, when your results will be ready. Summary A colonoscopy is a procedure to look at the  entire large intestine. Follow instructions from your health care provider about eating and drinking before the procedure. If you were prescribed an oral bowel prep  to clean out your colon, take it as told by your health care provider. During the colonoscopy, a flexible tube with a camera on its end is inserted into the anus and then passed into all parts of the large intestine. This information is not intended to replace advice given to you by your health care provider. Make sure you discuss any questions you have with your health care provider. Document Revised: 06/05/2022 Document Reviewed: 12/14/2020 Elsevier Patient Education  2024 [employer] Colonoscopy, Adult, Care After The following information offers guidance on how to care for yourself after your procedure. Your health care provider may also give you more specific instructions. If you have problems or questions, contact your health care provider. What can I expect after the procedure? After the procedure, it is common to have: A small amount of blood in your stool for 24 hours after the procedure. Some gas. Mild cramping or bloating of your abdomen. Follow these instructions at home: Eating and drinking  Drink enough fluid to keep your urine pale yellow. Follow instructions from your health care provider about eating or drinking restrictions. Resume your normal diet as told by your health care provider. Avoid heavy or fried foods that are hard to digest. Activity Rest as told by your health care provider. Avoid sitting for a long time without moving. Get up to take short walks every 1-2 hours. This is important to improve blood flow and breathing. Ask for help if you feel weak or unsteady. Return to your normal activities as told by your health care provider. Ask your health care provider what activities are safe for you. Managing cramping and bloating  Try walking around when you have cramps or feel bloated. If directed,  apply heat to your abdomen as told by your health care provider. Use the heat source that your health care provider recommends, such as a moist heat pack or a heating pad. Place a towel between your skin and the heat source. Leave the heat on for 20-30 minutes. Remove the heat if your skin turns bright red. This is especially important if you are unable to feel pain, heat, or cold. You have a greater risk of getting burned. General instructions If you were given a sedative during the procedure, it can affect you for several hours. Do not drive or operate machinery until your health care provider says that it is safe. For the first 24 hours after the procedure: Do not sign important documents. Do not drink alcohol. Do your regular daily activities at a slower pace than normal. Eat soft foods that are easy to digest. Take over-the-counter and prescription medicines only as told by your health care provider. Keep all follow-up visits. This is important. Contact a health care provider if: You have blood in your stool 2-3 days after the procedure. Get help right away if: You have more than a small spotting of blood in your stool. You have large blood clots in your stool. You have swelling of your abdomen. You have nausea or vomiting. You have a fever. You have increasing pain in your abdomen that is not relieved with medicine. These symptoms may be an emergency. Get help right away. Call 911. Do not wait to see if the symptoms will go away. Do not drive yourself to the hospital. Summary After the procedure, it is common to have a small amount of blood in your stool. You may also have mild cramping and bloating of your abdomen. If you were given a  sedative during the procedure, it can affect you for several hours. Do not drive or operate machinery until your health care provider says that it is safe. Get help right away if you have a lot of blood in your stool, nausea or vomiting, a fever, or  increased pain in your abdomen. This information is not intended to replace advice given to you by your health care provider. Make sure you discuss any questions you have with your health care provider. Document Revised: 06/05/2022 Document Reviewed: 12/14/2020 Elsevier Patient Education  2024 [employer].

## 2023-03-06 ENCOUNTER — Encounter (HOSPITAL_COMMUNITY): Payer: Self-pay

## 2023-03-06 ENCOUNTER — Encounter (HOSPITAL_COMMUNITY)
Admission: RE | Admit: 2023-03-06 | Discharge: 2023-03-06 | Disposition: A | Discharge status: Home / Self Care | Payer: Medicare Other | Source: Ambulatory Visit | Attending: Internal Medicine | Admitting: Internal Medicine

## 2023-03-06 VITALS — BP 139/62 | HR 80 | Temp 97.8°F | Resp 18 | Ht 67.0 in | Wt 235.0 lb

## 2023-03-06 DIAGNOSIS — T502X2A Poisoning by carbonic-anhydrase inhibitors, benzothiadiazides and other diuretics, intentional self-harm, initial encounter: Secondary | ICD-10-CM | POA: Diagnosis present

## 2023-03-06 DIAGNOSIS — Z950 Presence of cardiac pacemaker: Secondary | ICD-10-CM

## 2023-03-06 LAB — BASIC METABOLIC PANEL
Anion gap: 9 (ref 5–15)
BUN: 17 mg/dL (ref 8–23)
CO2: 27 mmol/L (ref 22–32)
Calcium: 8.9 mg/dL (ref 8.9–10.3)
Chloride: 100 mmol/L (ref 98–111)
Creatinine, Ser: 1.14 mg/dL — ABNORMAL HIGH (ref 0.44–1.00)
GFR, Estimated: 49 mL/min — ABNORMAL LOW (ref >60)
Glucose, Bld: 176 mg/dL — ABNORMAL HIGH (ref 70–99)
Potassium: 3.9 mmol/L (ref 3.5–5.1)
Sodium: 136 mmol/L (ref 135–145)

## 2023-03-07 ENCOUNTER — Other Ambulatory Visit (HOSPITAL_COMMUNITY): Payer: Medicare Other

## 2023-03-07 DIAGNOSIS — M1711 Unilateral primary osteoarthritis, right knee: Secondary | ICD-10-CM | POA: Diagnosis not present

## 2023-03-11 ENCOUNTER — Ambulatory Visit (HOSPITAL_COMMUNITY): Payer: Medicare Other | Admitting: Anesthesiology

## 2023-03-11 ENCOUNTER — Encounter (HOSPITAL_COMMUNITY)
Admission: RE | Disposition: A | Discharge status: Home / Self Care | Payer: Self-pay | Source: Home / Self Care | Attending: Internal Medicine

## 2023-03-11 ENCOUNTER — Other Ambulatory Visit: Payer: Self-pay

## 2023-03-11 ENCOUNTER — Ambulatory Visit (HOSPITAL_COMMUNITY)
Admission: RE | Admit: 2023-03-11 | Discharge: 2023-03-11 | Disposition: A | Discharge status: Home / Self Care | Payer: Medicare Other | Attending: Internal Medicine | Admitting: Internal Medicine

## 2023-03-11 ENCOUNTER — Encounter (HOSPITAL_COMMUNITY): Payer: Self-pay

## 2023-03-11 DIAGNOSIS — E119 Type 2 diabetes mellitus without complications: Secondary | ICD-10-CM | POA: Diagnosis not present

## 2023-03-11 DIAGNOSIS — Z794 Long term (current) use of insulin: Secondary | ICD-10-CM | POA: Diagnosis not present

## 2023-03-11 DIAGNOSIS — K5904 Chronic idiopathic constipation: Secondary | ICD-10-CM

## 2023-03-11 DIAGNOSIS — G4730 Sleep apnea, unspecified: Secondary | ICD-10-CM | POA: Insufficient documentation

## 2023-03-11 DIAGNOSIS — I10 Essential (primary) hypertension: Secondary | ICD-10-CM | POA: Diagnosis not present

## 2023-03-11 DIAGNOSIS — K5730 Diverticulosis of large intestine without perforation or abscess without bleeding: Secondary | ICD-10-CM

## 2023-03-11 DIAGNOSIS — K635 Polyp of colon: Secondary | ICD-10-CM | POA: Diagnosis not present

## 2023-03-11 DIAGNOSIS — K529 Noninfective gastroenteritis and colitis, unspecified: Secondary | ICD-10-CM | POA: Insufficient documentation

## 2023-03-11 DIAGNOSIS — Z8249 Family history of ischemic heart disease and other diseases of the circulatory system: Secondary | ICD-10-CM | POA: Insufficient documentation

## 2023-03-11 DIAGNOSIS — Z950 Presence of cardiac pacemaker: Secondary | ICD-10-CM | POA: Insufficient documentation

## 2023-03-11 DIAGNOSIS — Z7984 Long term (current) use of oral hypoglycemic drugs: Secondary | ICD-10-CM | POA: Insufficient documentation

## 2023-03-11 DIAGNOSIS — E039 Hypothyroidism, unspecified: Secondary | ICD-10-CM | POA: Insufficient documentation

## 2023-03-11 DIAGNOSIS — K648 Other hemorrhoids: Secondary | ICD-10-CM

## 2023-03-11 DIAGNOSIS — K6389 Other specified diseases of intestine: Secondary | ICD-10-CM | POA: Diagnosis not present

## 2023-03-11 HISTORY — PX: COLONOSCOPY WITH PROPOFOL: SHX5780

## 2023-03-11 HISTORY — PX: POLYPECTOMY: SHX5525

## 2023-03-11 HISTORY — PX: BIOPSY: SHX5522

## 2023-03-11 LAB — GLUCOSE, CAPILLARY: Glucose-Capillary: 185 mg/dL — ABNORMAL HIGH (ref 70–99)

## 2023-03-11 SURGERY — COLONOSCOPY WITH PROPOFOL
Anesthesia: General

## 2023-03-11 MED ORDER — STERILE WATER FOR IRRIGATION IR SOLN
Status: DC | PRN
  Administered 2023-03-11: 60 mL

## 2023-03-11 MED ORDER — LACTATED RINGERS IV SOLN: INTRAVENOUS | Status: DC | PRN

## 2023-03-11 MED ORDER — PROPOFOL 10 MG/ML IV BOLUS
INTRAVENOUS | Status: DC | PRN
  Administered 2023-03-11: 50 mg via INTRAVENOUS

## 2023-03-11 MED ORDER — ESMOLOL HCL 100 MG/10ML IV SOLN
INTRAVENOUS | Status: DC | PRN
  Administered 2023-03-11: 10 mg via INTRAVENOUS

## 2023-03-11 MED ORDER — PROPOFOL 500 MG/50ML IV EMUL
INTRAVENOUS | Status: DC | PRN
  Administered 2023-03-11: 125 ug/kg/min via INTRAVENOUS
  Administered 2023-03-11: 80 mg via INTRAVENOUS
  Administered 2023-03-11: 150 ug/kg/min via INTRAVENOUS

## 2023-03-11 NOTE — Discharge Instructions (Addendum)
  Colonoscopy Discharge Instructions  Read the instructions outlined below and refer to this sheet in the next few weeks. These discharge instructions provide you with general information on caring for yourself after you leave the hospital. Your doctor may also give you specific instructions. While your treatment has been planned according to the most current medical practices available, unavoidable complications occasionally occur.   ACTIVITY You may resume your regular activity, but move at a slower pace for the next 24 hours.  Take frequent rest periods for the next 24 hours.  Walking will help get rid of the air and reduce the bloated feeling in your belly (abdomen).  No driving for 24 hours (because of the medicine (anesthesia) used during the test).   Do not sign any important legal documents or operate any machinery for 24 hours (because of the anesthesia used during the test).  NUTRITION Drink plenty of fluids.  You may resume your normal diet as instructed by your doctor.  Begin with a light meal and progress to your normal diet. Heavy or fried foods are harder to digest and may make you feel sick to your stomach (nauseated).  Avoid alcoholic beverages for 24 hours or as instructed.  MEDICATIONS You may resume your normal medications unless your doctor tells you otherwise.  WHAT YOU CAN EXPECT TODAY Some feelings of bloating in the abdomen.  Passage of more gas than usual.  Spotting of blood in your stool or on the toilet paper.  IF YOU HAD POLYPS REMOVED DURING THE COLONOSCOPY: No aspirin products for 7 days or as instructed.  No alcohol for 7 days or as instructed.  Eat a soft diet for the next 24 hours.  FINDING OUT THE RESULTS OF YOUR TEST Not all test results are available during your visit. If your test results are not back during the visit, make an appointment with your caregiver to find out the results. Do not assume everything is normal if you have not heard from your  caregiver or the medical facility. It is important for you to follow up on all of your test results.  SEEK IMMEDIATE MEDICAL ATTENTION IF: You have more than a spotting of blood in your stool.  Your belly is swollen (abdominal distention).  You are nauseated or vomiting.  You have a temperature over 101.  You have abdominal pain or discomfort that is severe or gets worse throughout the day.   Your colonoscopy revealed 1 polyp(s) which I removed successfully.   I took samples of your colon to rule out a condition called microscopic colitis.   Await pathology results my office will contact you.   You also have diverticulosis and internal hemorrhoids. I would recommend increasing fiber in your diet or adding OTC Benefiber/Metamucil. Be sure to drink at least 4 to 6 glasses of water daily.   Follow-up with GI in 2-3 months    I hope you have a great rest of your week!  Hennie Duos. Marletta Lor, D.O. Gastroenterology and Hepatology [facility]

## 2023-03-11 NOTE — Op Note (Signed)
[facility] Patient Name: Alicia [name] Procedure Date: 03/11/2023 9:00 AM MRN: [id number] Date of Birth: 01/31/1946 Attending MD: Hennie Duos. Marletta Lor , [place], [id number] CSN: [id number] Age: 77 Admit Type: Outpatient Procedure:                Colonoscopy Indications:              Chronic diarrhea Providers:                Hennie Duos. Marletta Lor, DO, Nena Polio, RN, Dyann Ruddle Referring MD:              Medicines:                See the Anesthesia note for documentation of the                            administered medications Complications:            No immediate complications. Estimated Blood Loss:     Estimated blood loss was minimal. Procedure:                Pre-Anesthesia Assessment:                           - The anesthesia plan was to use monitored                            anesthesia care (MAC).                           After obtaining informed consent, the colonoscope                            was passed under direct vision. Throughout the                            procedure, the patient's blood pressure, pulse, and                            oxygen saturations were monitored continuously. The                            PCF-HQ190L ([id number]) scope was introduced through                            the anus and advanced to the the cecum, identified                            by appendiceal orifice and ileocecal valve. The                            colonoscopy was technically difficult and complex                            due to a redundant colon, significant looping and a  tortuous colon. Successful completion of the                            procedure was aided by changing the patient to a                            supine position and applying abdominal pressure.                            The patient tolerated the procedure well. The                            quality of the bowel preparation was evaluated                            using the  BBPS [facility] Bowel Preparation Scale)                            with scores of: Right Colon = 2 (minor amount of                            residual staining, small fragments of stool and/or                            opaque liquid, but mucosa seen well), Transverse                            Colon = 2 (minor amount of residual staining, small                            fragments of stool and/or opaque liquid, but mucosa                            seen well) and Left Colon = 2 (minor amount of                            residual staining, small fragments of stool and/or                            opaque liquid, but mucosa seen well). The total                            BBPS score equals 6. Fair. Scope In: 9:15:16 AM Scope Out: 10:00:22 AM Scope Withdrawal Time: 0 hours 27 minutes 23 seconds  Total Procedure Duration: 0 hours 45 minutes 6 seconds  Findings:      Non-bleeding internal hemorrhoids were found during endoscopy.      Multiple large-mouthed and small-mouthed diverticula were found in the       sigmoid colon and descending colon.      A 5 mm polyp was found in the descending colon. The polyp was sessile.       The polyp was removed with a cold snare. Resection and retrieval were  complete.      Biopsies for histology were taken with a cold forceps from the ascending       colon, transverse colon and descending colon for evaluation of       microscopic colitis.      A localized area of nodular mucosa was found at the hepatic flexure.       Biopsies were taken with a cold forceps for histology.      The colon (entire examined portion) was significantly redundant,       excessive looping of colonoscope. Advancing the scope required changing       the patient to a supine position. Advancing the scope required applying       abdominal pressure. Impression:               - Non-bleeding internal hemorrhoids.                           - Diverticulosis in the sigmoid colon and  in the                            descending colon.                           - One 5 mm polyp in the descending colon, removed                            with a cold snare. Resected and retrieved.                           - Nodular mucosa at the hepatic flexure. Biopsied.                           - Redundant colon.                           - Biopsies were taken with a cold forceps from the                            ascending colon, transverse colon and descending                            colon for evaluation of microscopic colitis. Moderate Sedation:      Per Anesthesia Care Recommendation:           - Patient has a contact number available for                            emergencies. The signs and symptoms of potential                            delayed complications were discussed with the                            patient. Return to normal activities tomorrow.  Written discharge instructions were provided to the                            patient.                           - Resume previous diet.                           - Continue present medications.                           - Await pathology results.                           - Repeat colonoscopy date to be determined after                            pending pathology results are reviewed for                            surveillance based on pathology results. Procedure Code(s):        --- Professional ---                           [phone], Colonoscopy, flexible; with removal of                            tumor(s), polyp(s), or other lesion(s) by snare                            technique                           45380, 59, Colonoscopy, flexible; with biopsy,                            single or multiple Diagnosis Code(s):        --- Professional ---                           K64.8, Other hemorrhoids                           D12.4, Benign neoplasm of descending colon                            K63.89, Other specified diseases of intestine                           K52.9, Noninfective gastroenteritis and colitis,                            unspecified                           K57.30, Diverticulosis of large intestine without  perforation or abscess without bleeding                           Q43.8, Other specified congenital malformations of                            intestine CPT copyright 2022 American Medical Association. All rights reserved. The codes documented in this report are preliminary and upon coder review may  be revised to meet current compliance requirements. Hennie Duos. Marletta Lor, DO Hennie Duos. Marletta Lor, DO 03/11/2023 10:03:59 AM This report has been signed electronically. Number of Addenda: 0

## 2023-03-11 NOTE — Anesthesia Preprocedure Evaluation (Signed)
Anesthesia Evaluation  Patient identified by MRN, date of birth, ID band Patient awake    Reviewed: Allergy & Precautions, H&P , NPO status , Patient's Chart, lab work & pertinent test results, reviewed documented beta blocker date and time   Airway Mallampati: II  TM Distance: >3 FB Neck ROM: full    Dental no notable dental hx.    Pulmonary neg pulmonary ROS, sleep apnea    Pulmonary exam normal breath sounds clear to auscultation       Cardiovascular Exercise Tolerance: Good hypertension, negative cardio ROS + dysrhythmias + pacemaker  Rhythm:regular Rate:Normal     Neuro/Psych   Anxiety     negative neurological ROS  negative psych ROS   GI/Hepatic negative GI ROS, Neg liver ROS,,,  Endo/Other  negative endocrine ROSdiabetesHypothyroidism    Renal/GU negative Renal ROS  negative genitourinary   Musculoskeletal   Abdominal   Peds  Hematology negative hematology ROS (+) Blood dyscrasia, anemia   Anesthesia Other Findings   Reproductive/Obstetrics negative OB ROS                             Anesthesia Physical Anesthesia Plan  ASA: 2  Anesthesia Plan: General   Post-op Pain Management:    Induction:   PONV Risk Score and Plan: Propofol infusion  Airway Management Planned:   Additional Equipment:   Intra-op Plan:   Post-operative Plan:   Informed Consent: I have reviewed the patients History and Physical, chart, labs and discussed the procedure including the risks, benefits and alternatives for the proposed anesthesia with the patient or authorized representative who has indicated his/her understanding and acceptance.     Dental Advisory Given  Plan Discussed with: CRNA  Anesthesia Plan Comments:        Anesthesia Quick Evaluation

## 2023-03-11 NOTE — Transfer of Care (Signed)
Immediate Anesthesia Transfer of Care Note  Patient: Alicia [name]  Procedure(s) Performed: COLONOSCOPY WITH PROPOFOL BIOPSY POLYPECTOMY  Patient Location: Short Stay  Anesthesia Type:General  Level of Consciousness: awake  Airway & Oxygen Therapy: Patient Spontanous Breathing  Post-op Assessment: Report given to RN and Post -op Vital signs reviewed and stable  Post vital signs: Reviewed and stable  Last Vitals:  Vitals Value Taken Time  BP    Temp    Pulse    Resp    SpO2      Last Pain:  Vitals:   03/11/23 0911  TempSrc:   PainSc: 0-No pain         Complications: No notable events documented.

## 2023-03-11 NOTE — H&P (Signed)
Primary Care Physician:  Richardean Chimera, MD Primary Gastroenterologist:  Dr. Marletta Lor  Pre-Procedure History & Physical: HPI:  Alicia [name] is a 77 y.o. female is here for a colonoscopy for chronic diarrhea  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/2012   DUAL CHAMBER    DR Ladona Ridgel   Sleep apnea    No machine Cant tolerate   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   Wears glasses     Past Surgical History:  Procedure Laterality Date   ANKLE SURGERY Right    multiple   BREAST SURGERY     biopsy L breast benign   CHOLECYSTECTOMY     COLONOSCOPY     EYE SURGERY     both cataracts   FOOT ARTHROTOMY     left   FOOT SURGERY     Bil  bone spurs /Left  foot neuroma removed Right foot sinus tarside surgery Right bone in toe removed   KNEE SURGERY     right   PACEMAKER INSERTION  12/15/2012   SJM 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 by Dr Ladona Ridgel for 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   PERMANENT PACEMAKER INSERTION N/A 12/15/2012   SJM 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 by Dr Ladona Ridgel for transient AV block   ROBOTIC ASSISTED TOTAL HYSTERECTOMY WITH BILATERAL SALPINGO OOPHERECTOMY N/A 02/19/2017   Procedure: XI ROBOTIC ASSISTED TOTAL HYSTERECTOMY WITH BILATERAL SALPINGO OOPHORECTOMY;  Surgeon: Adolphus Birchwood, MD;  Location: [facility];  Service: Gynecology;  Laterality: N/A;   SENTINEL NODE BIOPSY N/A 02/19/2017   Procedure: SENTINEL NODE BIOPSY;  Surgeon: Adolphus Birchwood, MD;  Location: [facility];  Service: Gynecology;  Laterality: N/A;   SHOULDER ARTHROSCOPY WITH  SUBACROMIAL DECOMPRESSION AND OPEN ROTATOR C Right 07/07/2013   Procedure: RIGHT SHOULDER ARTHROSCOPY WITH SUBACROMIAL DECOMPRESSION AND ROTATOR CUFF DEBRIDEMENT/ DISTAL CLAVICLE RESECTION;  Surgeon: Wyn Forster., MD;  Location: [facility];  Service: Orthopedics;  Laterality: Right;   SHOULDER SURGERY Right 2012    Prior to Admission medications   Medication Sig Start Date End Date Taking? Authorizing Provider  glipiZIDE (GLUCOTROL XL) 2.5 MG 24 hr tablet Take 2.5 mg by mouth daily with breakfast.   Yes [provider]  hydrochlorothiazide (HYDRODIURIL) 25 MG tablet Take 25 mg by mouth daily.  08/13/16  Yes [provider]  levothyroxine (SYNTHROID) 112 MCG tablet TAKE 1 TABLET BY MOUTH ONCE DAILY BEFORE BREAKFAST 08/03/21  Yes Lazaro Arms, MD  loratadine (CLARITIN) 10 MG tablet Take 10 mg by mouth daily.   Yes [provider]  Na Sulfate-K Sulfate-Mg Sulf 17.5-3.13-1.6 GM/177ML SOLN As directed 02/04/23  Yes Yvonne Petite, Hennie Duos, DO  Polyethyl Glycol-Propyl Glycol (SYSTANE) 0.4-0.3 % SOLN Apply to eye.   Yes [provider]  apixaban (ELIQUIS) 5 MG TABS tablet Take 1 tablet (5 mg total) by mouth 2 (two) times daily. 04/22/20  Hillis Range, MD  citalopram (CELEXA) 10 MG tablet Take 10 mg by mouth daily. Mon, Wed, Fri 09/25/22   [provider]  folic acid (FOLVITE) 1 MG tablet Take 1 tablet (1 mg total) by mouth daily. 01/19/23   Letta Median, PA-C  insulin NPH-regular Human (NOVOLIN 70/30) (70-30) 100 UNIT/ML injection Inject 50 Units into the skin 2 (two) times daily with a meal.    [provider]  INSULIN SYRINGE 1CC/29G 29G X 1/2" 1 ML MISC  10/05/14   [provider]  Misc Natural Products (ESSIAC TONIC) CAPS Take 2 capsules by mouth 2 (two) times daily.    [provider]  Probiotic Product (PROBIOTIC DAILY PO) Take 1 tablet by mouth daily.    [provider]    Allergies as of 02/04/2023  - Review Complete 01/10/2023  Allergen Reaction Noted   Ace inhibitors Cough 10/20/2013   Cardizem [diltiazem hcl] Other (See Comments) 10/20/2013   Misc. sulfonamide containing compounds Other (See Comments) 05/25/2022   Oxycodone Nausea Only 12/11/2016   Statins Other (See Comments) 10/20/2013   Sulfa antibiotics Hives and Swelling 12/13/2012   Tape  12/13/2012   Aspirin Rash 06/02/2019   Cortisone Rash 12/24/2012   Fd&c yellow #5 (tartrazine) Rash 12/11/2016   Latex Rash 12/13/2012    Family History  Problem Relation Age of Onset   Sudden death Brother        age 68s, died while playing raquetball   Stroke Mother    Cancer Father        throat   Tongue cancer Father    Heart disease Maternal Grandmother    Cancer Maternal Aunt        type unk,hx smoking   Cancer Maternal Uncle        type unk, hx smok   Cancer Paternal Aunt 23       thinks it was GI/colon- had hole in side and colostomy bag   Cancer Maternal Aunt        type unk, had a colostomy, hx smoking   Cancer Maternal Uncle        cancer maybe-?type unk   Throat cancer Maternal Uncle        hx smoking   Heart attack Maternal Uncle 50   Cancer Cousin        type unk- died in 58's/50's   Cancer Cousin        type unk, died in 2's/50's   Breast cancer Cousin        x2   Other Other 25       brain tumor   Allergic rhinitis Neg Hx    Angioedema Neg Hx    Asthma Neg Hx    Atopy Neg Hx    Eczema Neg Hx    Immunodeficiency Neg Hx    Urticaria Neg Hx     Social History   Socioeconomic History   Marital status: Widowed    Spouse name: Not on file   Number of children: 2   Years of education: Not on file   Highest education level: Not on file  Occupational History   Occupation: retired  Tobacco Use   Smoking status: Never   Smokeless tobacco: Never  Vaping Use   Vaping status: Never Used  Substance and Sexual Activity   Alcohol use: No    Alcohol/week: 0.0 standard drinks of alcohol   Drug use:  No   Sexual activity: Not Currently  Birth control/protection: Post-menopausal  Other Topics Concern   Not on file  Social History Narrative   Retired and lives in [place] with spouse   Social Determinants of Health   Financial Resource Strain: Not on file  Food Insecurity: Not on file  Transportation Needs: Not on file  Physical Activity: Not on file  Stress: Not on file  Social Connections: Not on file  Intimate Partner Violence: Not on file    Review of Systems: See HPI, otherwise negative ROS  Physical Exam: Vital signs in last 24 hours: Temp:  [98.1 F (36.7 C)] 98.1 F (36.7 C) (11/04 0826) Pulse Rate:  [60] 60 (11/04 0826) Resp:  [16] 16 (11/04 0826) BP: (187)/(64) 187/64 (11/04 0826) SpO2:  [96 %] 96 % (11/04 0826) Weight:  [106.6 kg] 106.6 kg (11/04 0826)   General:   Alert,  Well-developed, well-nourished, pleasant and cooperative in NAD Head:  Normocephalic and atraumatic. Eyes:  Sclera clear, no icterus.   Conjunctiva pink. Ears:  Normal auditory acuity. Nose:  No deformity, discharge,  or lesions. Msk:  Symmetrical without gross deformities. Normal posture. Extremities:  Without clubbing or edema. Neurologic:  Alert and  oriented x4;  grossly normal neurologically. Skin:  Intact without significant lesions or rashes. Psych:  Alert and cooperative. Normal mood and affect.  Impression/Plan: Alicia [name] is here for a colonoscopy to be performed for chronic diarrhea  The risks of the procedure including infection, bleed, or perforation as well as benefits, limitations, alternatives and imponderables have been reviewed with the patient. Questions have been answered. All parties agreeable.

## 2023-03-12 LAB — SURGICAL PATHOLOGY

## 2023-03-12 NOTE — Anesthesia Postprocedure Evaluation (Signed)
Anesthesia Post Note  Patient: Alicia [name]  Procedure(s) Performed: COLONOSCOPY WITH PROPOFOL BIOPSY POLYPECTOMY  Patient location during evaluation: Phase II Anesthesia Type: General Level of consciousness: awake Pain management: pain level controlled Vital Signs Assessment: post-procedure vital signs reviewed and stable Respiratory status: spontaneous breathing and respiratory function stable Cardiovascular status: blood pressure returned to baseline and stable Postop Assessment: no headache and no apparent nausea or vomiting Anesthetic complications: no Comments: Late entry   No notable events documented.   Last Vitals:  Vitals:   03/11/23 0826 03/11/23 1006  BP: (!) 187/64 (!) 141/57  Pulse: 60 63  Resp: 16 15  Temp: 36.7 C 36.4 C  SpO2: 96% 99%    Last Pain:  Vitals:   03/11/23 1006  TempSrc: Axillary  PainSc: 0-No pain                 Windell Norfolk

## 2023-03-13 DIAGNOSIS — E7849 Other hyperlipidemia: Secondary | ICD-10-CM | POA: Diagnosis not present

## 2023-03-13 DIAGNOSIS — E559 Vitamin D deficiency, unspecified: Secondary | ICD-10-CM | POA: Diagnosis not present

## 2023-03-13 DIAGNOSIS — E1165 Type 2 diabetes mellitus with hyperglycemia: Secondary | ICD-10-CM | POA: Diagnosis not present

## 2023-03-13 DIAGNOSIS — E039 Hypothyroidism, unspecified: Secondary | ICD-10-CM | POA: Diagnosis not present

## 2023-03-13 DIAGNOSIS — E782 Mixed hyperlipidemia: Secondary | ICD-10-CM | POA: Diagnosis not present

## 2023-03-13 DIAGNOSIS — K219 Gastro-esophageal reflux disease without esophagitis: Secondary | ICD-10-CM | POA: Diagnosis not present

## 2023-03-13 DIAGNOSIS — N189 Chronic kidney disease, unspecified: Secondary | ICD-10-CM | POA: Diagnosis not present

## 2023-03-13 DIAGNOSIS — Z0000 Encounter for general adult medical examination without abnormal findings: Secondary | ICD-10-CM | POA: Diagnosis not present

## 2023-03-14 ENCOUNTER — Encounter (HOSPITAL_COMMUNITY): Payer: Self-pay | Admitting: Internal Medicine

## 2023-03-15 DIAGNOSIS — M1712 Unilateral primary osteoarthritis, left knee: Secondary | ICD-10-CM | POA: Diagnosis not present

## 2023-03-19 ENCOUNTER — Telehealth: Payer: Self-pay | Admitting: Internal Medicine

## 2023-03-19 DIAGNOSIS — Z0001 Encounter for general adult medical examination with abnormal findings: Secondary | ICD-10-CM | POA: Diagnosis not present

## 2023-03-19 DIAGNOSIS — M609 Myositis, unspecified: Secondary | ICD-10-CM | POA: Diagnosis not present

## 2023-03-19 DIAGNOSIS — E1122 Type 2 diabetes mellitus with diabetic chronic kidney disease: Secondary | ICD-10-CM | POA: Diagnosis not present

## 2023-03-19 DIAGNOSIS — I10 Essential (primary) hypertension: Secondary | ICD-10-CM | POA: Diagnosis not present

## 2023-03-19 DIAGNOSIS — E7849 Other hyperlipidemia: Secondary | ICD-10-CM | POA: Diagnosis not present

## 2023-03-19 DIAGNOSIS — I700 Atherosclerosis of aorta: Secondary | ICD-10-CM | POA: Diagnosis not present

## 2023-03-19 DIAGNOSIS — L500 Allergic urticaria: Secondary | ICD-10-CM | POA: Diagnosis not present

## 2023-03-19 DIAGNOSIS — I482 Chronic atrial fibrillation, unspecified: Secondary | ICD-10-CM | POA: Diagnosis not present

## 2023-03-19 DIAGNOSIS — Z1389 Encounter for screening for other disorder: Secondary | ICD-10-CM | POA: Diagnosis not present

## 2023-03-19 DIAGNOSIS — Z23 Encounter for immunization: Secondary | ICD-10-CM | POA: Diagnosis not present

## 2023-03-19 NOTE — Telephone Encounter (Signed)
Returned the pt's call and advised the pt of what her results were and transferred to the front desk for a follow up appt with Baxter Hire

## 2023-03-19 NOTE — Telephone Encounter (Signed)
Patient called because she said her colonoscopy results are in mychart but she doesn't understand what it is saying and asks if she could receive a call back about the results.

## 2023-03-22 DIAGNOSIS — M1712 Unilateral primary osteoarthritis, left knee: Secondary | ICD-10-CM | POA: Diagnosis not present

## 2023-03-28 ENCOUNTER — Encounter: Payer: Self-pay | Admitting: Internal Medicine

## 2023-03-28 ENCOUNTER — Ambulatory Visit: Payer: Medicare Other | Admitting: Internal Medicine

## 2023-03-28 VITALS — BP 154/79 | HR 60 | Temp 98.7°F | Ht 66.0 in | Wt 234.0 lb

## 2023-03-28 DIAGNOSIS — R109 Unspecified abdominal pain: Secondary | ICD-10-CM | POA: Diagnosis not present

## 2023-03-28 DIAGNOSIS — R197 Diarrhea, unspecified: Secondary | ICD-10-CM | POA: Diagnosis not present

## 2023-03-28 DIAGNOSIS — R152 Fecal urgency: Secondary | ICD-10-CM | POA: Diagnosis not present

## 2023-03-28 MED ORDER — COLESTIPOL HCL 1 G PO TABS: 2.0000 g | ORAL_TABLET | Freq: Every day | ORAL | 11 refills | Status: DC

## 2023-03-28 NOTE — Progress Notes (Signed)
Referring Provider: Richardean Chimera, MD Primary Care Physician:  Richardean Chimera, MD Primary GI:  Dr. Marletta Lor  Chief Complaint  Patient presents with   Follow-up    Follow up after procedure and wants results of her procedure (I spoke with the pt on the phone about her report)    HPI:   Alicia [name] is a 77 y.o. female who presents to clinic today for follow-up visit.  History of postprandial diarrhea.  TSH WNL, alpha gal, celiac, negative.  Fecal elastase normal  Previously prescribed dicyclomine but caused dizziness and fatigue.  Has tried changes in her diet without success.  Colonoscopy 03/11/2023 showed internal hemorrhoids, diverticulosis in the sigmoid and descending colon, 5 mm polyp removed in ascending colon (hyperplastic), colon biopsies negative for microscopic colitis.  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  She was concerned about possibly taking this.  Continues to complain of chronic diarrhea, occasional fecal urgency, primarily after meals.  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/2012   DUAL CHAMBER    DR Ladona Ridgel   Sleep apnea    No machine Cant tolerate   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   Wears glasses     Past Surgical History:  Procedure Laterality Date   ANKLE SURGERY Right    multiple   BIOPSY  03/11/2023   Procedure: BIOPSY;  Surgeon: Lanelle Bal, DO;  Location: [facility];  Service: Endoscopy;;   BREAST SURGERY     biopsy L breast benign   CHOLECYSTECTOMY     COLONOSCOPY     COLONOSCOPY WITH PROPOFOL N/A 03/11/2023   Procedure: COLONOSCOPY WITH PROPOFOL;  Surgeon: Lanelle Bal, DO;  Location: [facility];  Service: Endoscopy;  Laterality: N/A;  915am, asa 3   EYE SURGERY     both cataracts   FOOT ARTHROTOMY     left   FOOT SURGERY     Bil  bone spurs /Left  foot neuroma removed Right foot sinus tarside surgery Right bone in toe removed   KNEE SURGERY     right   PACEMAKER INSERTION  12/15/2012   SJM 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 by Dr Ladona Ridgel for 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   PERMANENT PACEMAKER INSERTION N/A 12/15/2012   SJM 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 by Dr Ladona Ridgel for transient AV block   POLYPECTOMY  03/11/2023   Procedure: POLYPECTOMY;  Surgeon: Lanelle Bal, DO;  Location: [facility];  Service: Endoscopy;;   ROBOTIC ASSISTED TOTAL HYSTERECTOMY WITH BILATERAL SALPINGO OOPHERECTOMY N/A 02/19/2017   Procedure: XI ROBOTIC ASSISTED TOTAL HYSTERECTOMY WITH BILATERAL SALPINGO OOPHORECTOMY;  Surgeon: Adolphus Birchwood, MD;  Location: [facility];  Service: Gynecology;  Laterality: N/A;   SENTINEL NODE BIOPSY N/A 02/19/2017  Procedure: SENTINEL NODE BIOPSY;  Surgeon: Adolphus Birchwood, MD;  Location: [facility];  Service: Gynecology;  Laterality: N/A;   SHOULDER ARTHROSCOPY WITH SUBACROMIAL DECOMPRESSION AND OPEN ROTATOR C Right 07/07/2013   Procedure: RIGHT SHOULDER ARTHROSCOPY WITH SUBACROMIAL DECOMPRESSION AND ROTATOR CUFF DEBRIDEMENT/ DISTAL CLAVICLE RESECTION;  Surgeon: Wyn Forster., MD;  Location: [facility];  Service: Orthopedics;  Laterality: Right;   SHOULDER SURGERY Right 2012    Current Outpatient Medications  Medication Sig Dispense Refill   apixaban (ELIQUIS) 5 MG TABS tablet Take 1 tablet (5 mg total) by mouth 2 (two) times daily. 60 tablet 11   citalopram (CELEXA) 10 MG tablet Take 10 mg by mouth daily. Mon, Wed,  Fri     colestipol (COLESTID) 1 g tablet Take 2 tablets (2 g total) by mouth daily. Take other meds 1 hr before or 4-6 hr after cholestyramine. 60 tablet 11   folic acid (FOLVITE) 1 MG tablet Take 1 tablet (1 mg total) by mouth daily. 30 tablet 0   glipiZIDE (GLUCOTROL XL) 2.5 MG 24 hr tablet Take 2.5 mg by mouth daily with breakfast.     hydrochlorothiazide (HYDRODIURIL) 25 MG tablet Take 25 mg by mouth daily.      insulin NPH-regular Human (NOVOLIN 70/30) (70-30) 100 UNIT/ML injection Inject 50 Units into the skin 2 (two) times daily with a meal.     INSULIN SYRINGE 1CC/29G 29G X 1/2" 1 ML MISC      levothyroxine (SYNTHROID) 112 MCG tablet TAKE 1 TABLET BY MOUTH ONCE DAILY BEFORE BREAKFAST 90 tablet 3   loratadine (CLARITIN) 10 MG tablet Take 10 mg by mouth daily.     Misc Natural Products (ESSIAC TONIC) CAPS Take 2 capsules by mouth 2 (two) times daily.     Polyethyl Glycol-Propyl Glycol (SYSTANE) 0.4-0.3 % SOLN Apply to eye.     Probiotic Product (PROBIOTIC DAILY PO) Take 1 tablet by mouth daily.     No current facility-administered medications for this visit.    Allergies as of 03/28/2023 - Review Complete 03/28/2023  Allergen Reaction Noted   Ace inhibitors Cough 10/20/2013   Cardizem [diltiazem hcl] Other (See Comments) 10/20/2013   Misc. sulfonamide containing compounds Other (See Comments) 05/25/2022   Oxycodone Nausea Only 12/11/2016   Statins Other (See Comments) 10/20/2013   Sulfa antibiotics Hives and Swelling 12/13/2012   Tape  12/13/2012   Aspirin Rash 06/02/2019   Cortisone Rash 12/24/2012   Fd&c yellow #5 (tartrazine) Rash 12/11/2016   Latex Rash 12/13/2012    Family History  Problem Relation Age of Onset   Sudden death Brother        age 17s, died while playing raquetball   Stroke Mother    Cancer Father        throat   Tongue cancer Father    Heart disease Maternal Grandmother    Cancer Maternal Aunt        type unk,hx smoking   Cancer Maternal Uncle         type unk, hx smok   Cancer Paternal Aunt 62       thinks it was GI/colon- had hole in side and colostomy bag   Cancer Maternal Aunt        type unk, had a colostomy, hx smoking   Cancer Maternal Uncle        cancer maybe-?type unk   Throat cancer Maternal Uncle        hx smoking   Heart attack Maternal  Uncle 50   Cancer Cousin        type unk- died in 69's/50's   Cancer Cousin        type unk, died in 63's/50's   Breast cancer Cousin        x2   Other Other 25       brain tumor   Allergic rhinitis Neg Hx    Angioedema Neg Hx    Asthma Neg Hx    Atopy Neg Hx    Eczema Neg Hx    Immunodeficiency Neg Hx    Urticaria Neg Hx     Social History   Socioeconomic History   Marital status: Widowed    Spouse name: Not on file   Number of children: 2   Years of education: Not on file   Highest education level: Not on file  Occupational History   Occupation: retired  Tobacco Use   Smoking status: Never   Smokeless tobacco: Never  Vaping Use   Vaping status: Never Used  Substance and Sexual Activity   Alcohol use: No    Alcohol/week: 0.0 standard drinks of alcohol   Drug use: No   Sexual activity: Not Currently    Birth control/protection: Post-menopausal  Other Topics Concern   Not on file  Social History Narrative   Retired and lives in [place] with spouse   Social Determinants of Health   Financial Resource Strain: Not on file  Food Insecurity: Not on file  Transportation Needs: Not on file  Physical Activity: Not on file  Stress: Not on file  Social Connections: Not on file    Subjective: Review of Systems  Constitutional:  Negative for chills and fever.  HENT:  Negative for congestion and hearing loss.   Eyes:  Negative for blurred vision and double vision.  Respiratory:  Negative for cough and shortness of breath.   Cardiovascular:  Negative for chest pain and palpitations.  Gastrointestinal:  Positive for diarrhea. Negative for abdominal pain, blood in  stool, constipation, heartburn, melena and vomiting.  Genitourinary:  Negative for dysuria and urgency.  Musculoskeletal:  Negative for joint pain and myalgias.  Skin:  Negative for itching and rash.  Neurological:  Negative for dizziness and headaches.  Psychiatric/Behavioral:  Negative for depression. The patient is not nervous/anxious.      Objective: BP (!) 154/79   Pulse 60   Temp 98.7 F (37.1 C)   Ht 5\' 6"  (1.676 m)   Wt 234 lb (106.1 kg)   BMI 37.77 kg/m  Physical Exam Constitutional:      Appearance: Normal appearance.  HENT:     Head: Normocephalic and atraumatic.  Eyes:     Extraocular Movements: Extraocular movements intact.     Conjunctiva/sclera: Conjunctivae normal.  Cardiovascular:     Rate and Rhythm: Normal rate and regular rhythm.  Pulmonary:     Effort: Pulmonary effort is normal.     Breath sounds: Normal breath sounds.  Abdominal:     General: Bowel sounds are normal.     Palpations: Abdomen is soft.  Musculoskeletal:        General: No swelling. Normal range of motion.     Cervical back: Normal range of motion and neck supple.  Skin:    General: Skin is warm and dry.     Coloration: Skin is not jaundiced.  Neurological:     General: No focal deficit present.     Mental Status: She is alert and oriented to person,  place, and time.  Psychiatric:        Mood and Affect: Mood normal.        Behavior: Behavior normal.      Assessment: *Diarrhea *Fecal urgency *Abdominal pain  Plan: Reviewed colonoscopy results with patient today.  Other workup per HPI.  Discussed colestipol with patient today for possible bile acid induced diarrhea.  She is agreeable to try this.  New prescription sent to pharmacy.  Counseled to wait at least 1 hour after other medications before taking this.  Follow-up in 4 to 6 weeks.  03/28/2023 9:34 AM   Disclaimer: This note was dictated with voice recognition software. Similar sounding words can inadvertently be  transcribed and may not be corrected upon review.

## 2023-03-28 NOTE — Patient Instructions (Addendum)
Recommend you try the colestipol 2 pills daily.  Need to wait at least an hour after other medications before taking this.  I have sent this to your pharmacy.  If you develop constipation, would back down to 1 pill daily.  Follow-up in 4 to 6 weeks.  It was very nice seeing you again today.  Dr. Marletta Lor

## 2023-03-29 ENCOUNTER — Encounter: Payer: Self-pay | Admitting: Internal Medicine

## 2023-03-29 DIAGNOSIS — M1712 Unilateral primary osteoarthritis, left knee: Secondary | ICD-10-CM | POA: Diagnosis not present

## 2023-04-09 ENCOUNTER — Emergency Department (HOSPITAL_COMMUNITY)
Admission: EM | Admit: 2023-04-09 | Discharge: 2023-04-09 | Disposition: A | Discharge status: Home / Self Care | Payer: Medicare Other | Attending: Emergency Medicine | Admitting: Emergency Medicine

## 2023-04-09 ENCOUNTER — Emergency Department (HOSPITAL_COMMUNITY): Payer: Medicare Other

## 2023-04-09 DIAGNOSIS — M546 Pain in thoracic spine: Secondary | ICD-10-CM | POA: Diagnosis not present

## 2023-04-09 DIAGNOSIS — R519 Headache, unspecified: Secondary | ICD-10-CM | POA: Insufficient documentation

## 2023-04-09 DIAGNOSIS — Z794 Long term (current) use of insulin: Secondary | ICD-10-CM | POA: Insufficient documentation

## 2023-04-09 DIAGNOSIS — S22080A Wedge compression fracture of T11-T12 vertebra, initial encounter for closed fracture: Secondary | ICD-10-CM | POA: Diagnosis not present

## 2023-04-09 DIAGNOSIS — Z743 Need for continuous supervision: Secondary | ICD-10-CM | POA: Diagnosis not present

## 2023-04-09 DIAGNOSIS — Z91040 Latex allergy status: Secondary | ICD-10-CM | POA: Insufficient documentation

## 2023-04-09 DIAGNOSIS — Z7984 Long term (current) use of oral hypoglycemic drugs: Secondary | ICD-10-CM | POA: Diagnosis not present

## 2023-04-09 DIAGNOSIS — S0990XA Unspecified injury of head, initial encounter: Secondary | ICD-10-CM | POA: Insufficient documentation

## 2023-04-09 DIAGNOSIS — S199XXA Unspecified injury of neck, initial encounter: Secondary | ICD-10-CM | POA: Diagnosis not present

## 2023-04-09 DIAGNOSIS — M160 Bilateral primary osteoarthritis of hip: Secondary | ICD-10-CM | POA: Diagnosis not present

## 2023-04-09 DIAGNOSIS — I6523 Occlusion and stenosis of bilateral carotid arteries: Secondary | ICD-10-CM | POA: Diagnosis not present

## 2023-04-09 DIAGNOSIS — Z79899 Other long term (current) drug therapy: Secondary | ICD-10-CM | POA: Insufficient documentation

## 2023-04-09 DIAGNOSIS — S299XXA Unspecified injury of thorax, initial encounter: Secondary | ICD-10-CM | POA: Diagnosis not present

## 2023-04-09 DIAGNOSIS — R6889 Other general symptoms and signs: Secondary | ICD-10-CM | POA: Diagnosis not present

## 2023-04-09 DIAGNOSIS — M4722 Other spondylosis with radiculopathy, cervical region: Secondary | ICD-10-CM | POA: Diagnosis not present

## 2023-04-09 DIAGNOSIS — Z7901 Long term (current) use of anticoagulants: Secondary | ICD-10-CM | POA: Insufficient documentation

## 2023-04-09 DIAGNOSIS — R110 Nausea: Secondary | ICD-10-CM | POA: Diagnosis not present

## 2023-04-09 DIAGNOSIS — Z043 Encounter for examination and observation following other accident: Secondary | ICD-10-CM | POA: Diagnosis not present

## 2023-04-09 DIAGNOSIS — M549 Dorsalgia, unspecified: Secondary | ICD-10-CM | POA: Diagnosis not present

## 2023-04-09 DIAGNOSIS — S3992XA Unspecified injury of lower back, initial encounter: Secondary | ICD-10-CM | POA: Diagnosis not present

## 2023-04-09 DIAGNOSIS — W01198A Fall on same level from slipping, tripping and stumbling with subsequent striking against other object, initial encounter: Secondary | ICD-10-CM | POA: Insufficient documentation

## 2023-04-09 DIAGNOSIS — Z950 Presence of cardiac pacemaker: Secondary | ICD-10-CM | POA: Diagnosis not present

## 2023-04-09 MED ORDER — OXYCODONE HCL 5 MG PO TABS: 5.0000 mg | ORAL_TABLET | ORAL | 0 refills | Status: DC | PRN

## 2023-04-09 MED ORDER — LIDOCAINE 5 % EX PTCH: 1.0000 | MEDICATED_PATCH | CUTANEOUS | 0 refills | Status: DC

## 2023-04-09 MED ORDER — FENTANYL CITRATE PF 50 MCG/ML IJ SOSY
50.0000 ug | PREFILLED_SYRINGE | Freq: Once | INTRAMUSCULAR | Status: AC
  Administered 2023-04-09: 50 ug via INTRAVENOUS
  Filled 2023-04-09: qty 1

## 2023-04-09 MED ORDER — ONDANSETRON 4 MG PO TBDP: 4.0000 mg | ORAL_TABLET | Freq: Three times a day (TID) | ORAL | 0 refills | Status: DC | PRN

## 2023-04-09 NOTE — ED Provider Notes (Signed)
[place] EMERGENCY DEPARTMENT AT [facility] Provider Note   CSN: [id number] Arrival date & time: 04/09/23  1642     History  Chief Complaint  Patient presents with   Alicia [name] is a 77 y.o. female.  Patient here as a level 2 trauma she fell.  She was trying to hang something up and fell backwards and landed on her back.  She hit her head.  She was on ground level.  She is having some mid back pain and headache but otherwise no extremity pain.  She got Zofran and route.  She is on Eliquis.  She denies any weakness numbness tingling.  No abdominal pain.  The history is provided by the patient.       Home Medications Prior to Admission medications   Medication Sig Start Date End Date Taking? Authorizing Provider  apixaban (ELIQUIS) 5 MG TABS tablet Take 1 tablet (5 mg total) by mouth 2 (two) times daily. 04/22/20  Yes Allred, Fayrene Fearing, MD  lidocaine (LIDODERM) 5 % Place 1 patch onto the skin daily. Remove & Discard patch within 12 hours or as directed by MD 04/09/23  Yes Roald Lukacs, DO  ondansetron (ZOFRAN-ODT) 4 MG disintegrating tablet Take 1 tablet (4 mg total) by mouth every 8 (eight) hours as needed for nausea or vomiting. 04/09/23  Yes Ramia Sidney, DO  oxyCODONE (ROXICODONE) 5 MG immediate release tablet Take 1 tablet (5 mg total) by mouth every 4 (four) hours as needed for up to 10 doses for severe pain (pain score 7-10). 04/09/23  Yes Robbert Langlinais, DO  citalopram (CELEXA) 10 MG tablet Take 10 mg by mouth daily. Mon, Wed, Fri 09/25/22   [provider]  colestipol (COLESTID) 1 g tablet Take 2 tablets (2 g total) by mouth daily. Take other meds 1 hr before or 4-6 hr after cholestyramine. 03/28/23 03/27/24  Lanelle Bal, DO  folic acid (FOLVITE) 1 MG tablet Take 1 tablet (1 mg total) by mouth daily. 01/19/23   Letta Median, PA-C  glipiZIDE (GLUCOTROL XL) 2.5 MG 24 hr tablet Take 2.5 mg by mouth daily with breakfast.    [provider]  hydrochlorothiazide (HYDRODIURIL) 25 MG tablet Take 25 mg by mouth daily.  08/13/16   [provider]  insulin NPH-regular Human (NOVOLIN 70/30) (70-30) 100 UNIT/ML injection Inject 50 Units into the skin 2 (two) times daily with a meal.    [provider]  INSULIN SYRINGE 1CC/29G 29G X 1/2" 1 ML MISC  10/05/14   [provider]  levothyroxine (SYNTHROID) 112 MCG tablet TAKE 1 TABLET BY MOUTH ONCE DAILY BEFORE BREAKFAST 08/03/21   Lazaro Arms, MD  loratadine (CLARITIN) 10 MG tablet Take 10 mg by mouth daily.    [provider]  Misc Natural Products (ESSIAC TONIC) CAPS Take 2 capsules by mouth 2 (two) times daily.    [provider]  Polyethyl Glycol-Propyl Glycol (SYSTANE) 0.4-0.3 % SOLN Apply to eye.    [provider]  Probiotic Product (PROBIOTIC DAILY PO) Take 1 tablet by mouth daily.    [provider]      Allergies    Ace inhibitors, Cardizem [diltiazem hcl], Misc. sulfonamide containing compounds, Oxycodone, Statins, Sulfa antibiotics, Tape, Aspirin, Cortisone, Fd&c yellow #5 (tartrazine), and Latex    Review of Systems   Review of Systems  Physical Exam Updated Vital Signs BP (!) 168/54   Pulse 67   Temp 98.2 F (  36.8 C) (Oral)   Resp (!) 22   Ht 5\' 6"  (1.676 m)   Wt 104.3 kg   SpO2 98%   BMI 37.12 kg/m  Physical Exam Vitals and nursing note reviewed.  Constitutional:      General: She is not in acute distress.    Appearance: She is well-developed. She is not ill-appearing.  HENT:     Head: Normocephalic and atraumatic.  Eyes:     Extraocular Movements: Extraocular movements intact.     Conjunctiva/sclera: Conjunctivae normal.     Pupils: Pupils are equal, round, and reactive to light.  Cardiovascular:     Rate and Rhythm: Normal rate and regular rhythm.     Heart sounds: No murmur heard. Pulmonary:     Effort: Pulmonary effort is normal. No respiratory distress.     Breath sounds:  Normal breath sounds.  Abdominal:     Palpations: Abdomen is soft.     Tenderness: There is no abdominal tenderness.  Musculoskeletal:        General: No swelling.     Cervical back: Normal range of motion and neck supple. No tenderness.     Comments: Tenderness to the mid back but no specific midline tenderness  Skin:    General: Skin is warm and dry.     Capillary Refill: Capillary refill takes less than 2 seconds.  Neurological:     General: No focal deficit present.     Mental Status: She is alert and oriented to person, place, and time.     Cranial Nerves: No cranial nerve deficit.     Sensory: No sensory deficit.     Coordination: Coordination normal.  Psychiatric:        Mood and Affect: Mood normal.     ED Results / Procedures / Treatments   Labs (all labs ordered are listed, but only abnormal results are displayed) Labs Reviewed - No data to display  EKG None  Radiology CT Cervical Spine Wo Contrast  Result Date: 04/09/2023 CLINICAL DATA:  Polytrauma, blunt; Lumbar radiculopathy, trauma EXAM: CT CERVICAL, THORACIC, AND LUMBAR SPINE WITHOUT CONTRAST TECHNIQUE: Multidetector CT imaging of the cervical, thoracic and lumbar spine was performed without intravenous contrast. Multiplanar CT image reconstructions were also generated. RADIATION DOSE REDUCTION: This exam was performed according to the departmental dose-optimization program which includes automated exposure control, adjustment of the mA and/or kV according to patient size and/or use of iterative reconstruction technique. COMPARISON:  None Available. FINDINGS: CT CERVICAL SPINE FINDINGS Alignment: Normal. Skull base and vertebrae: Multilevel mild degenerative changes spine. No acute fracture. No aggressive appearing focal osseous lesion or focal pathologic process. Soft tissues and spinal canal: No prevertebral fluid or swelling. No visible canal hematoma. Upper chest: Unremarkable. Other: Question right hypodense  thyroid gland nodule. CT THORACIC SPINE FINDINGS Alignment: Normal. Vertebrae: Multilevel mild-to-moderate degenerative changes spine with continuous osteophyte formation along the thoracic spine. Acute minimally displaced fracture of the anterior continuous osteophytes as well as superior endplate at the T12 level. Paraspinal and other soft tissues: Negative. Disc levels: Maintained. CT LUMBAR SPINE FINDINGS Segmentation: 5 lumbar type vertebrae. Alignment: Normal. Vertebrae: Multilevel mild-to-moderate degenerative changes of the spine. No acute fracture or focal pathologic process. Paraspinal and other soft tissues: Negative. Disc levels: Maintained. Other: Status post cholecystectomy. Cardiac pacemaker. Severe atherosclerotic plaque. Colonic diverticulosis. 3 mm calcified stone within the right kidney may represent a non-obstructive calcified stone versus vascular calcification. IMPRESSION: 1. Acute minimally displaced fracture of the anterior continuous osteophytes as  well as superior endplate at the T12 level. 2. No acute displaced fracture or traumatic listhesis of the cervical and lumbar spine. 3. Question right hypodense thyroid gland nodule. Recommend thyroid US (ref: J Am Coll Radiol. 2015 Feb;12(2): 143-50). Electronically Signed   By: Tish Frederickson M.D.   On: 04/09/2023 17:58   CT Lumbar Spine Wo Contrast  Result Date: 04/09/2023 CLINICAL DATA:  Polytrauma, blunt; Lumbar radiculopathy, trauma EXAM: CT CERVICAL, THORACIC, AND LUMBAR SPINE WITHOUT CONTRAST TECHNIQUE: Multidetector CT imaging of the cervical, thoracic and lumbar spine was performed without intravenous contrast. Multiplanar CT image reconstructions were also generated. RADIATION DOSE REDUCTION: This exam was performed according to the departmental dose-optimization program which includes automated exposure control, adjustment of the mA and/or kV according to patient size and/or use of iterative reconstruction technique. COMPARISON:   None Available. FINDINGS: CT CERVICAL SPINE FINDINGS Alignment: Normal. Skull base and vertebrae: Multilevel mild degenerative changes spine. No acute fracture. No aggressive appearing focal osseous lesion or focal pathologic process. Soft tissues and spinal canal: No prevertebral fluid or swelling. No visible canal hematoma. Upper chest: Unremarkable. Other: Question right hypodense thyroid gland nodule. CT THORACIC SPINE FINDINGS Alignment: Normal. Vertebrae: Multilevel mild-to-moderate degenerative changes spine with continuous osteophyte formation along the thoracic spine. Acute minimally displaced fracture of the anterior continuous osteophytes as well as superior endplate at the T12 level. Paraspinal and other soft tissues: Negative. Disc levels: Maintained. CT LUMBAR SPINE FINDINGS Segmentation: 5 lumbar type vertebrae. Alignment: Normal. Vertebrae: Multilevel mild-to-moderate degenerative changes of the spine. No acute fracture or focal pathologic process. Paraspinal and other soft tissues: Negative. Disc levels: Maintained. Other: Status post cholecystectomy. Cardiac pacemaker. Severe atherosclerotic plaque. Colonic diverticulosis. 3 mm calcified stone within the right kidney may represent a non-obstructive calcified stone versus vascular calcification. IMPRESSION: 1. Acute minimally displaced fracture of the anterior continuous osteophytes as well as superior endplate at the T12 level. 2. No acute displaced fracture or traumatic listhesis of the cervical and lumbar spine. 3. Question right hypodense thyroid gland nodule. Recommend thyroid US (ref: J Am Coll Radiol. 2015 Feb;12(2): 143-50). Electronically Signed   By: Tish Frederickson M.D.   On: 04/09/2023 17:58   CT THORACIC SPINE WO CONTRAST  Result Date: 04/09/2023 CLINICAL DATA:  Polytrauma, blunt; Lumbar radiculopathy, trauma EXAM: CT CERVICAL, THORACIC, AND LUMBAR SPINE WITHOUT CONTRAST TECHNIQUE: Multidetector CT imaging of the cervical, thoracic  and lumbar spine was performed without intravenous contrast. Multiplanar CT image reconstructions were also generated. RADIATION DOSE REDUCTION: This exam was performed according to the departmental dose-optimization program which includes automated exposure control, adjustment of the mA and/or kV according to patient size and/or use of iterative reconstruction technique. COMPARISON:  None Available. FINDINGS: CT CERVICAL SPINE FINDINGS Alignment: Normal. Skull base and vertebrae: Multilevel mild degenerative changes spine. No acute fracture. No aggressive appearing focal osseous lesion or focal pathologic process. Soft tissues and spinal canal: No prevertebral fluid or swelling. No visible canal hematoma. Upper chest: Unremarkable. Other: Question right hypodense thyroid gland nodule. CT THORACIC SPINE FINDINGS Alignment: Normal. Vertebrae: Multilevel mild-to-moderate degenerative changes spine with continuous osteophyte formation along the thoracic spine. Acute minimally displaced fracture of the anterior continuous osteophytes as well as superior endplate at the T12 level. Paraspinal and other soft tissues: Negative. Disc levels: Maintained. CT LUMBAR SPINE FINDINGS Segmentation: 5 lumbar type vertebrae. Alignment: Normal. Vertebrae: Multilevel mild-to-moderate degenerative changes of the spine. No acute fracture or focal pathologic process. Paraspinal and other soft tissues: Negative. Disc levels: Maintained. Other: Status  post cholecystectomy. Cardiac pacemaker. Severe atherosclerotic plaque. Colonic diverticulosis. 3 mm calcified stone within the right kidney may represent a non-obstructive calcified stone versus vascular calcification. IMPRESSION: 1. Acute minimally displaced fracture of the anterior continuous osteophytes as well as superior endplate at the T12 level. 2. No acute displaced fracture or traumatic listhesis of the cervical and lumbar spine. 3. Question right hypodense thyroid gland nodule.  Recommend thyroid US (ref: J Am Coll Radiol. 2015 Feb;12(2): 143-50). Electronically Signed   By: Tish Frederickson M.D.   On: 04/09/2023 17:58   CT Head Wo Contrast  Result Date: 04/09/2023 CLINICAL DATA:  Polytrauma, blunt EXAM: CT HEAD WITHOUT CONTRAST TECHNIQUE: Contiguous axial images were obtained from the base of the skull through the vertex without intravenous contrast. RADIATION DOSE REDUCTION: This exam was performed according to the departmental dose-optimization program which includes automated exposure control, adjustment of the mA and/or kV according to patient size and/or use of iterative reconstruction technique. COMPARISON:  None Available. FINDINGS: Brain: Trace patchy and confluent areas of decreased attenuation are noted throughout the deep and periventricular white matter of the cerebral hemispheres bilaterally, compatible with chronic microvascular ischemic disease. No evidence of large-territorial acute infarction. No parenchymal hemorrhage. No mass lesion. No extra-axial collection. No mass effect or midline shift. No hydrocephalus. Basilar cisterns are patent. Vascular: No hyperdense vessel. Atherosclerotic calcifications are present within the cavernous internal carotid arteries. Skull: No acute fracture or focal lesion. Sinuses/Orbits: Right frontal sinus mucosal thickening. Otherwise paranasal sinuses and mastoid air cells are clear. Bilateral lens replacement. Otherwise the orbits are unremarkable. Other: None. IMPRESSION: No acute intracranial abnormality. Electronically Signed   By: Tish Frederickson M.D.   On: 04/09/2023 17:40   DG Pelvis Portable  Result Date: 04/09/2023 CLINICAL DATA:  fall EXAM: PORTABLE PELVIS 1-2 VIEWS COMPARISON:  None Available. FINDINGS: Limited evaluation due to overlapping osseous structures and overlying soft tissues. Bilateral hip mild degenerative changes. No acute displaced fracture or dislocation of either hips. There is no evidence of pelvic  fracture or diastasis. No pelvic bone lesions are seen. Vascular calcifications. IMPRESSION: Negative for acute traumatic injury. Electronically Signed   By: Tish Frederickson M.D.   On: 04/09/2023 17:16   DG Chest Portable 1 View  Result Date: 04/09/2023 CLINICAL DATA:  fall EXAM: PORTABLE CHEST 1 VIEW COMPARISON:  Chest x-ray 12/16/2012 FINDINGS: Left chest wall 2 lead pacemaker in similar position. The heart and mediastinal contours are unchanged. No focal consolidation. No pulmonary edema. No pleural effusion. No pneumothorax. No acute osseous abnormality. IMPRESSION: No active disease. Electronically Signed   By: Tish Frederickson M.D.   On: 04/09/2023 17:15    Procedures Procedures    Medications Ordered in ED Medications  fentaNYL (SUBLIMAZE) injection 50 mcg (50 mcg Intravenous Given 04/09/23 1944)    ED Course/ Medical Decision Making/ A&P                                 Medical Decision Making Amount and/or Complexity of Data Reviewed Radiology: ordered.  Risk Prescription drug management.   Alicia [name] is here after mechanical fall.  Level 2 trauma as patient on blood thinner.  CT scan of head and neck thoracic and lumbar spine ordered.  Is not having pain elsewhere.  Ground-level fall.  She is very well-appearing otherwise.  CT scan of the head and neck are unremarkable.  There appears to be a small compression fracture of  T12.  She was placed in a TLSO brace and was able to ambulate without any issues.  On reevaluation she is feeling good.  She is able to ambulate.  No other pain elsewhere.  Will write her for oxycodone for breakthrough pain will have her follow-up with spine team.  She will follow-up with her primary care doctor as well.  Given her prescription for Zofran and lidocaine patches as well.  Recommend Tylenol.  This chart was dictated using voice recognition software.  Despite best efforts to proofread,  errors can occur which can change the documentation meaning.          Final Clinical Impression(s) / ED Diagnoses Final diagnoses:  Compression fracture of T12 vertebra, initial encounter [facility])    Rx / DC Orders ED Discharge Orders          Ordered    oxyCODONE (ROXICODONE) 5 MG immediate release tablet  Every 4 hours PRN        04/09/23 2047    ondansetron (ZOFRAN-ODT) 4 MG disintegrating tablet  Every 8 hours PRN        04/09/23 2047    lidocaine (LIDODERM) 5 %  Every 24 hours        04/09/23 2047              Virgina Norfolk, DO 04/09/23 2051

## 2023-04-09 NOTE — Progress Notes (Signed)
Orthopedic Tech Progress Note Patient Details:  [name] June 06, 1945 [id number] Checked in for Level 2 Trauma.  Patient ID: Alicia [name], female   DOB: [birth year], 77 y.o.   MRN: [id number]  Blase Mess 04/09/2023, 5:32 PM

## 2023-04-09 NOTE — ED Triage Notes (Signed)
Pt arrives via [facility] for Level 2 fall on thinners. PT was walking at her home on rocks and fell, hitting her head on the concrete. Pt denies LOC. Anticoagulated on Eliquis. Pt c/o L sided back pain. Had one episode of emesis PTA and given 4 mg IV zofran enroute.

## 2023-04-09 NOTE — Discharge Instructions (Signed)
Take Roxicodone as needed for breakthrough pain but otherwise I recommend 1000 mg of Tylenol every 6 hours as needed for pain.  Recommend lidocaine patches either prescription or over-the-counter.  I have provided you with a prescription for Zofran for nausea if needed as well.  Follow-up with neurosurgery team, Alicia [name].

## 2023-04-09 NOTE — Progress Notes (Signed)
This chaplain responded with the medical team to Level 2 Trauma. The chaplain understands from EMS the Pt. neighbors are aware of the Pt. fall and trip to the hospital. At the time of the chaplain visit, the Pt. is able to describe the fall to the medical team.  This chaplain is available for F/U spiritual care as needed.  Chaplain Stephanie Acre [phone]

## 2023-04-09 NOTE — ED Notes (Signed)
Ambulated to restroom independently with supervision

## 2023-04-09 NOTE — ED Notes (Signed)
Trauma Response Nurse Documentation   Alicia [name] is a 77 y.o. female arriving to [facility] ED via EMS  On Eliquis (apixaban) daily. Trauma was activated as a Level 2 by ED Charge RN based on the following trauma criteria Elderly patients > 65 with head trauma on anti-coagulation (excluding ASA).  Patient cleared for CT by Dr. Lockie Mola. Pt transported to CT with trauma response nurse present to monitor. RN remained with the patient throughout their absence from the department for clinical observation.   GCS 15.  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    DR Ladona Ridgel   Sleep apnea    No machine Cant tolerate   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   Wears glasses      Past Surgical History:  Procedure Laterality Date   ANKLE SURGERY Right    multiple   BIOPSY  03/11/2023   Procedure: BIOPSY;  Surgeon: Lanelle Bal, DO;  Location: [facility];  Service: Endoscopy;;   BREAST SURGERY     biopsy L breast benign   CHOLECYSTECTOMY     COLONOSCOPY     COLONOSCOPY WITH PROPOFOL N/A 03/11/2023   Procedure: COLONOSCOPY WITH PROPOFOL;  Surgeon: Lanelle Bal, DO;  Location: [facility];  Service: Endoscopy;  Laterality: N/A;  915am, asa 3   EYE SURGERY     both cataracts   FOOT ARTHROTOMY     left   FOOT SURGERY     Bil  bone spurs /Left  foot neuroma removed Right foot sinus tarside surgery Right bone in toe removed   KNEE SURGERY     right   PACEMAKER INSERTION  12/15/2012   SJM Assurity  DR implanted by Dr Ladona Ridgel for [facility] 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   PERMANENT PACEMAKER INSERTION N/A 12/15/2012   SJM 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 by Dr Ladona Ridgel for transient AV block   POLYPECTOMY  03/11/2023   Procedure: POLYPECTOMY;  Surgeon: Lanelle Bal, DO;  Location: [facility];  Service: Endoscopy;;   ROBOTIC ASSISTED TOTAL HYSTERECTOMY WITH BILATERAL SALPINGO OOPHERECTOMY N/A 02/19/2017   Procedure: XI ROBOTIC ASSISTED TOTAL HYSTERECTOMY WITH BILATERAL SALPINGO OOPHORECTOMY;  Surgeon: Adolphus Birchwood, MD;  Location: [facility];  Service: Gynecology;  Laterality: N/A;   SENTINEL NODE BIOPSY N/A 02/19/2017   Procedure: SENTINEL NODE BIOPSY;  Surgeon: Adolphus Birchwood, MD;  Location: [facility];  Service: Gynecology;  Laterality: N/A;   SHOULDER ARTHROSCOPY WITH SUBACROMIAL DECOMPRESSION AND OPEN ROTATOR C Right 07/07/2013   Procedure: RIGHT SHOULDER ARTHROSCOPY WITH SUBACROMIAL DECOMPRESSION AND ROTATOR CUFF DEBRIDEMENT/ DISTAL CLAVICLE RESECTION;  Surgeon: Wyn Forster., MD;  Location: [facility];  Service: Orthopedics;  Laterality: Right;   SHOULDER SURGERY Right 2012     Initial Focused Assessment (If applicable, or please see trauma documentation): Airway:  Intact, patent  Breathing: Clear breath sounds bilaterally Circulation: No obvious signs of external hemorrhage, small redness to L flank to the left of the thoracic spine. Pulses intact centrally and peripherally  18G PIV to R AC Disability: A/Ox4, C-collar in place  CT's Completed:   CT Head and CT C-Spine  T-spine and L-spine  Interventions:  CXR Pelvic XR  Plan for disposition:  Other   Consults completed:  none at 1730.  Event Summary: BIB [facility] after she was attempting to put a flag by her mailbox and proceeded to fall.  She is unsure as to what caused the fall other than losing her footing.  Pt is on eliquis for afib and did take her morning dose.  Pt denies LOC and states that she did  strike the back top of her head and her left flank region.  Pt mostly c/o thoracic and lumbar pain.  Pt generally uses a cane when she goes to the store or church but not around the house.   Bedside handoff with ED RN Alicia [name].    Alicia [name]  Trauma Response RN  Please call TRN at [phone] for further assistance.

## 2023-04-09 NOTE — Progress Notes (Signed)
Orthopedic Tech Progress Note Patient Details:  Alicia [name] [facility] Apr 11, 1946 [id number]  Ortho Devices Type of Ortho Device: Thoracolumbar corset (TLSO) Ortho Device/Splint Interventions: Ordered, Application, Adjustment   Post Interventions Patient Tolerated: Well Instructions Provided: Care of device, Adjustment of device  [name] 04/09/2023, 8:24 PM

## 2023-04-12 DIAGNOSIS — E041 Nontoxic single thyroid nodule: Secondary | ICD-10-CM | POA: Diagnosis not present

## 2023-04-12 DIAGNOSIS — S22000A Wedge compression fracture of unspecified thoracic vertebra, initial encounter for closed fracture: Secondary | ICD-10-CM | POA: Diagnosis not present

## 2023-04-17 ENCOUNTER — Telehealth: Payer: Self-pay | Admitting: Cardiovascular Disease

## 2023-04-17 NOTE — Telephone Encounter (Signed)
Patient notified and verbalized understanding. 

## 2023-04-17 NOTE — Telephone Encounter (Signed)
Pt c/o BP issue: STAT if pt c/o blurred vision, one-sided weakness or slurred speech  1. What are your last 5 BP readings? 147/39 66, 127/37 62, 143/58 61, 152/51 65  2. Are you having any other symptoms (ex. Dizziness, headache, blurred vision, passed out)? no  3. What is your BP issue? Pt is concerned with the diastolic number of her BP

## 2023-04-25 ENCOUNTER — Encounter: Payer: Self-pay | Admitting: Internal Medicine

## 2023-04-25 ENCOUNTER — Ambulatory Visit: Payer: Medicare Other | Admitting: Internal Medicine

## 2023-04-25 VITALS — BP 157/78 | HR 70 | Temp 97.8°F | Ht 66.0 in | Wt 240.2 lb

## 2023-04-25 DIAGNOSIS — R197 Diarrhea, unspecified: Secondary | ICD-10-CM | POA: Diagnosis not present

## 2023-04-25 DIAGNOSIS — R152 Fecal urgency: Secondary | ICD-10-CM

## 2023-04-25 DIAGNOSIS — R109 Unspecified abdominal pain: Secondary | ICD-10-CM

## 2023-04-25 NOTE — Progress Notes (Signed)
Referring Provider: Richardean Chimera, MD Primary Care Physician:  Richardean Chimera, MD Primary GI:  Dr. Marletta Lor  Chief Complaint  Patient presents with   Follow-up    Follow up on diarrhea. Pt states it is better    HPI:   Alicia [name] is a 77 y.o. female who presents to clinic today for follow-up visit.  History of postprandial diarrhea.  TSH WNL, alpha gal, celiac, negative.  Fecal elastase normal  Previously prescribed dicyclomine but caused dizziness and fatigue.  Has tried changes in her diet without success.  Colonoscopy 03/11/2023 showed internal hemorrhoids, diverticulosis in the sigmoid and descending colon, 5 mm polyp removed in ascending colon (hyperplastic), colon biopsies negative for microscopic colitis.  Status post cholecystectomy, started on Colestipol on previous visit. Unable to tolerate pills due to their size.   Today, states she is doing better. She is not sure what changed but her diarrhea is improved. She is watching her diet more closely. Not eating out as much.   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/2012   DUAL CHAMBER    DR Ladona Ridgel   Sleep apnea    No machine Cant tolerate   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   Wears glasses     Past Surgical History:  Procedure Laterality Date   ANKLE SURGERY Right    multiple   BIOPSY  03/11/2023   Procedure: BIOPSY;  Surgeon: Lanelle Bal, DO;  Location: [facility];  Service: Endoscopy;;   BREAST SURGERY     biopsy L breast benign    CHOLECYSTECTOMY     COLONOSCOPY     COLONOSCOPY WITH PROPOFOL N/A 03/11/2023   Procedure: COLONOSCOPY WITH PROPOFOL;  Surgeon: Lanelle Bal, DO;  Location: [facility];  Service: Endoscopy;  Laterality: N/A;  915am, asa 3   EYE SURGERY     both cataracts   FOOT ARTHROTOMY     left   FOOT SURGERY     Bil  bone spurs /Left  foot neuroma removed Right foot sinus tarside surgery Right bone in toe removed   KNEE SURGERY     right   PACEMAKER INSERTION  12/15/2012   SJM 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 by Dr Ladona Ridgel for [facility] 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   PERMANENT PACEMAKER INSERTION N/A 12/15/2012   SJM 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 by Dr Ladona Ridgel for transient AV block   POLYPECTOMY  03/11/2023   Procedure: POLYPECTOMY;  Surgeon: Lanelle Bal, DO;  Location: [facility];  Service: Endoscopy;;   ROBOTIC ASSISTED TOTAL HYSTERECTOMY WITH BILATERAL SALPINGO OOPHERECTOMY N/A 02/19/2017   Procedure: XI ROBOTIC ASSISTED TOTAL HYSTERECTOMY WITH BILATERAL SALPINGO OOPHORECTOMY;  Surgeon: Adolphus Birchwood, MD;  Location: [facility];  Service: Gynecology;  Laterality: N/A;   SENTINEL NODE BIOPSY N/A 02/19/2017   Procedure: SENTINEL NODE BIOPSY;  Surgeon: Andrey Farmer,  Kara Mead, MD;  Location: [facility];  Service: Gynecology;  Laterality: N/A;   SHOULDER ARTHROSCOPY WITH SUBACROMIAL DECOMPRESSION AND OPEN ROTATOR C Right 07/07/2013   Procedure: RIGHT SHOULDER ARTHROSCOPY WITH SUBACROMIAL DECOMPRESSION AND ROTATOR CUFF DEBRIDEMENT/ DISTAL CLAVICLE RESECTION;  Surgeon: Wyn Forster., MD;  Location: [facility];  Service: Orthopedics;  Laterality: Right;   SHOULDER SURGERY Right 2012    Current Outpatient Medications  Medication Sig Dispense Refill   apixaban (ELIQUIS) 5 MG TABS tablet Take 1 tablet (5 mg total) by mouth 2 (two) times daily. 60 tablet 11   citalopram (CELEXA) 10 MG tablet Take 10 mg by mouth daily. Mon, Wed, Fri     colestipol (COLESTID) 1 g tablet Take 2 tablets (2 g total) by mouth daily.  Take other meds 1 hr before or 4-6 hr after cholestyramine. 60 tablet 11   folic acid (FOLVITE) 1 MG tablet Take 1 tablet (1 mg total) by mouth daily. 30 tablet 0   glipiZIDE (GLUCOTROL XL) 2.5 MG 24 hr tablet Take 2.5 mg by mouth daily with breakfast.     hydrochlorothiazide (HYDRODIURIL) 25 MG tablet Take 25 mg by mouth daily.      insulin NPH-regular Human (NOVOLIN 70/30) (70-30) 100 UNIT/ML injection Inject 50 Units into the skin 2 (two) times daily with a meal.     INSULIN SYRINGE 1CC/29G 29G X 1/2" 1 ML MISC      levothyroxine (SYNTHROID) 112 MCG tablet TAKE 1 TABLET BY MOUTH ONCE DAILY BEFORE BREAKFAST 90 tablet 3   lidocaine (LIDODERM) 5 % Place 1 patch onto the skin daily. Remove & Discard patch within 12 hours or as directed by MD 30 patch 0   loratadine (CLARITIN) 10 MG tablet Take 10 mg by mouth daily.     Misc Natural Products (ESSIAC TONIC) CAPS Take 2 capsules by mouth 2 (two) times daily.     ondansetron (ZOFRAN-ODT) 4 MG disintegrating tablet Take 1 tablet (4 mg total) by mouth every 8 (eight) hours as needed for nausea or vomiting. 20 tablet 0   oxyCODONE (ROXICODONE) 5 MG immediate release tablet Take 1 tablet (5 mg total) by mouth every 4 (four) hours as needed for up to 10 doses for severe pain (pain score 7-10). 10 tablet 0   Polyethyl Glycol-Propyl Glycol (SYSTANE) 0.4-0.3 % SOLN Apply to eye.     Probiotic Product (PROBIOTIC DAILY PO) Take 1 tablet by mouth daily.     No current facility-administered medications for this visit.    Allergies as of 04/25/2023 - Review Complete 04/25/2023  Allergen Reaction Noted   Ace inhibitors Cough 10/20/2013   Cardizem [diltiazem hcl] Other (See Comments) 10/20/2013   Misc. sulfonamide containing compounds Other (See Comments) 05/25/2022   Oxycodone Nausea Only 12/11/2016   Statins Other (See Comments) 10/20/2013   Sulfa antibiotics Hives and Swelling 12/13/2012   Tape  12/13/2012   Aspirin Rash 06/02/2019   Cortisone Rash  12/24/2012   Fd&c yellow #5 (tartrazine) Rash 12/11/2016   Latex Rash 12/13/2012    Family History  Problem Relation Age of Onset   Sudden death Brother        age 1s, died while playing raquetball   Stroke Mother    Cancer Father        throat   Tongue cancer Father    Heart disease Maternal Grandmother    Cancer Maternal Aunt        type unk,hx smoking   Cancer Maternal Uncle  type unk, hx smok   Cancer Paternal Aunt 23       thinks it was GI/colon- had hole in side and colostomy bag   Cancer Maternal Aunt        type unk, had a colostomy, hx smoking   Cancer Maternal Uncle        cancer maybe-?type unk   Throat cancer Maternal Uncle        hx smoking   Heart attack Maternal Uncle 50   Cancer Cousin        type unk- died in 33's/50's   Cancer Cousin        type unk, died in 61's/50's   Breast cancer Cousin        x2   Other Other 25       brain tumor   Allergic rhinitis Neg Hx    Angioedema Neg Hx    Asthma Neg Hx    Atopy Neg Hx    Eczema Neg Hx    Immunodeficiency Neg Hx    Urticaria Neg Hx     Social History   Socioeconomic History   Marital status: Widowed    Spouse name: Not on file   Number of children: 2   Years of education: Not on file   Highest education level: Not on file  Occupational History   Occupation: retired  Tobacco Use   Smoking status: Never   Smokeless tobacco: Never  Vaping Use   Vaping status: Never Used  Substance and Sexual Activity   Alcohol use: No    Alcohol/week: 0.0 standard drinks of alcohol   Drug use: No   Sexual activity: Not Currently    Birth control/protection: Post-menopausal  Other Topics Concern   Not on file  Social History Narrative   Retired and lives in [place] with spouse   Social Drivers of Corporate investment banker Strain: Not on file  Food Insecurity: Not on file  Transportation Needs: Not on file  Physical Activity: Not on file  Stress: Not on file  Social Connections: Not on file     Subjective: Review of Systems  Constitutional:  Negative for chills and fever.  HENT:  Negative for congestion and hearing loss.   Eyes:  Negative for blurred vision and double vision.  Respiratory:  Negative for cough and shortness of breath.   Cardiovascular:  Negative for chest pain and palpitations.  Gastrointestinal:  Positive for diarrhea. Negative for abdominal pain, blood in stool, constipation, heartburn, melena and vomiting.  Genitourinary:  Negative for dysuria and urgency.  Musculoskeletal:  Negative for joint pain and myalgias.  Skin:  Negative for itching and rash.  Neurological:  Negative for dizziness and headaches.  Psychiatric/Behavioral:  Negative for depression. The patient is not nervous/anxious.      Objective: BP (!) 175/74   Pulse 70   Temp 97.8 F (36.6 C)   Ht 5\' 6"  (1.676 m)   Wt 240 lb 3.2 oz (109 kg)   BMI 38.77 kg/m  Physical Exam Constitutional:      Appearance: Normal appearance.  HENT:     Head: Normocephalic and atraumatic.  Eyes:     Extraocular Movements: Extraocular movements intact.     Conjunctiva/sclera: Conjunctivae normal.  Cardiovascular:     Rate and Rhythm: Normal rate and regular rhythm.  Pulmonary:     Effort: Pulmonary effort is normal.     Breath sounds: Normal breath sounds.  Abdominal:     General: Bowel sounds  are normal.     Palpations: Abdomen is soft.  Musculoskeletal:        General: No swelling. Normal range of motion.     Cervical back: Normal range of motion and neck supple.  Skin:    General: Skin is warm and dry.     Coloration: Skin is not jaundiced.  Neurological:     General: No focal deficit present.     Mental Status: She is alert and oriented to person, place, and time.  Psychiatric:        Mood and Affect: Mood normal.        Behavior: Behavior normal.      Assessment: *Diarrhea *Fecal urgency *Abdominal pain  Plan: Reviewed colonoscopy results with patient today.  Other workup per  HPI.  Possible bile acid induced diarrhea though patient states she is improved.  Unable to tolerate pill form of cholestyramine.  If her symptoms worsen again, call office and I can send in powder form.  Continue to monitor diet, recommended food diary.   Follow-up in 3-4 months.   04/25/2023 10:22 AM   Disclaimer: This note was dictated with voice recognition software. Similar sounding words can inadvertently be transcribed and may not be corrected upon review.

## 2023-04-25 NOTE — Patient Instructions (Signed)
I am happy to hear that your diarrhea is better.  If it worsens again, let me know and I can send in powder form of the cholestyramine.  Otherwise follow-up in 3 to 4 months.  I hope you have a very [employer] and you recover from your back injury.  Dr. Marletta Lor

## 2023-05-10 ENCOUNTER — Encounter: Payer: Medicare Other | Admitting: Cardiovascular Disease

## 2023-05-15 DIAGNOSIS — M4854XD Collapsed vertebra, not elsewhere classified, thoracic region, subsequent encounter for fracture with routine healing: Secondary | ICD-10-CM | POA: Diagnosis not present

## 2023-05-16 DIAGNOSIS — M170 Bilateral primary osteoarthritis of knee: Secondary | ICD-10-CM | POA: Diagnosis not present

## 2023-05-16 NOTE — Progress Notes (Signed)
    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 female who presents today for routine electrophysiology followup.  Since last being seen in our clinic, the patient reports doing very well.  Today, she denies symptoms of palpitations, chest pain, shortness of breath,  lower extremity edema, dizziness, presyncope, or syncope.  The patient is otherwise without complaint today.     Physical Exam: Vitals:   05/17/23 1122  BP: 130/60  Pulse: 66  SpO2: 97%  Weight: 240 lb (108.9 kg)  Height: 5' 6 (1.676 m)     Gen: Appears comfortable, well-nourished CV: RRR, no dependent edema The device site is normal -- no tenderness, edema, drainage, redness, threatened erosion. Pulm: breathing easily   Pacemaker interrogation- reviewed in detail today,  See PACEART report   Assessment and Plan:  Symptomatic second degree heart block Normal pacemaker function See Pace Art report No changes today she is not device dependant today  Paroxysmal atrial fibrillation Afib burden is <1% -- only one episode since last visit; it lasted about 6 hours, asymptomatic though rates were uncontrolled. Chads2vasc score is 4.  She is on eliquis . Labs 10/24 reviewed She has diltiazem  as needed   No change today  HTN BP controlled today  Obesity Body mass index is 38.74 kg/m.   Lifestyle modification advised  OSA Not using CPAP  Return in a year  Alicia FORBES Nancey, MD  05/17/2023 12:03 PM

## 2023-05-17 ENCOUNTER — Encounter: Payer: Self-pay | Admitting: Cardiovascular Disease

## 2023-05-17 ENCOUNTER — Ambulatory Visit: Payer: Medicare Other | Attending: Cardiovascular Disease | Admitting: Cardiovascular Disease

## 2023-05-17 VITALS — BP 130/60 | HR 66 | Ht 66.0 in | Wt 240.0 lb

## 2023-05-17 DIAGNOSIS — I441 Atrioventricular block, second degree: Secondary | ICD-10-CM

## 2023-05-17 DIAGNOSIS — I10 Essential (primary) hypertension: Secondary | ICD-10-CM | POA: Diagnosis not present

## 2023-05-17 NOTE — Patient Instructions (Addendum)
 Medication Instructions:  Continue all current medications.   Labwork: none  Testing/Procedures: none  Follow-Up: 6 months   Any Other Special Instructions Will Be Listed Below (If Applicable).   If you need a refill on your cardiac medications before your next appointment, please call your pharmacy.

## 2023-05-20 ENCOUNTER — Ambulatory Visit (INDEPENDENT_AMBULATORY_CARE_PROVIDER_SITE_OTHER): Payer: Medicare Other

## 2023-05-20 ENCOUNTER — Telehealth: Payer: Self-pay | Admitting: Cardiovascular Disease

## 2023-05-20 DIAGNOSIS — I441 Atrioventricular block, second degree: Secondary | ICD-10-CM

## 2023-05-20 LAB — CUP PACEART INCLINIC DEVICE CHECK
Battery Remaining Longevity: 21 mo
Battery Voltage: 2.86 V
Brady Statistic RA Percent Paced: 52 %
Brady Statistic RV Percent Paced: 1.4 %
Date Time Interrogation Session: 20250110124600
Implantable Lead Connection Status: 753985
Implantable Lead Connection Status: 753985
Implantable Lead Implant Date: 20140811
Implantable Lead Implant Date: 20140811
Implantable Lead Location: 753859
Implantable Lead Location: 753860
Implantable Pulse Generator Implant Date: 20140811
Lead Channel Impedance Value: 362.5 Ohm
Lead Channel Impedance Value: 450 Ohm
Lead Channel Pacing Threshold Amplitude: 0.75 V
Lead Channel Pacing Threshold Amplitude: 0.75 V
Lead Channel Pacing Threshold Amplitude: 0.75 V
Lead Channel Pacing Threshold Amplitude: 0.75 V
Lead Channel Pacing Threshold Pulse Width: 0.4 ms
Lead Channel Pacing Threshold Pulse Width: 0.4 ms
Lead Channel Pacing Threshold Pulse Width: 0.4 ms
Lead Channel Pacing Threshold Pulse Width: 0.4 ms
Lead Channel Sensing Intrinsic Amplitude: 12 mV
Lead Channel Sensing Intrinsic Amplitude: 3 mV
Lead Channel Setting Pacing Amplitude: 1 V
Lead Channel Setting Pacing Amplitude: 1.5 V
Lead Channel Setting Pacing Pulse Width: 0.4 ms
Lead Channel Setting Sensing Sensitivity: 2 mV
Pulse Gen Model: 2240
Pulse Gen Serial Number: 7528878

## 2023-05-20 NOTE — Telephone Encounter (Signed)
   Pre-operative Risk Assessment  Patient Name: Alicia [name]  DOB: Dec 20, 1945 MRN: [id number]   Date of last office visit: 05/17/2023 Date of next office visit: 12/06/2023   Request for Surgical Clearance    Procedure:   Rt total knee arthroplasty  Date of Surgery:  Clearance 09/02/23                                Surgeon:  Dr. Dempsey Moan Surgeon's Group or Practice Name:  [facility] Phone number:  [phone] Fax number:  [phone]   Type of Clearance Requested:   - Pharmacy:  Hold Apixaban  (Eliquis ) need Eliquis  instructions   Type of Anesthesia:   choice   Additional requests/questions:    Bonney Jonel LITTIE Smitty   05/20/2023, 2:50 PM

## 2023-05-20 NOTE — Telephone Encounter (Signed)
 Pharmacy please advise on holding Eliquis prior to Rt Knee Arthroplasty scheduled for 09/02/2023. Thank you.

## 2023-05-21 DIAGNOSIS — E1140 Type 2 diabetes mellitus with diabetic neuropathy, unspecified: Secondary | ICD-10-CM | POA: Diagnosis not present

## 2023-05-21 DIAGNOSIS — M79674 Pain in right toe(s): Secondary | ICD-10-CM | POA: Diagnosis not present

## 2023-05-21 DIAGNOSIS — E1151 Type 2 diabetes mellitus with diabetic peripheral angiopathy without gangrene: Secondary | ICD-10-CM | POA: Diagnosis not present

## 2023-05-21 DIAGNOSIS — M79675 Pain in left toe(s): Secondary | ICD-10-CM | POA: Diagnosis not present

## 2023-05-21 DIAGNOSIS — M79671 Pain in right foot: Secondary | ICD-10-CM | POA: Diagnosis not present

## 2023-05-21 DIAGNOSIS — M79672 Pain in left foot: Secondary | ICD-10-CM | POA: Diagnosis not present

## 2023-05-21 DIAGNOSIS — L600 Ingrowing nail: Secondary | ICD-10-CM | POA: Diagnosis not present

## 2023-05-22 LAB — CUP PACEART REMOTE DEVICE CHECK
Battery Remaining Longevity: 19 mo
Battery Remaining Percentage: 15 %
Battery Voltage: 2.86 V
Brady Statistic AP VP Percent: 1 %
Brady Statistic AP VS Percent: 50 %
Brady Statistic AS VP Percent: 1 %
Brady Statistic AS VS Percent: 50 %
Brady Statistic RA Percent Paced: 48 %
Brady Statistic RV Percent Paced: 1 %
Date Time Interrogation Session: 20250113032541
Implantable Lead Connection Status: 753985
Implantable Lead Connection Status: 753985
Implantable Lead Implant Date: 20140811
Implantable Lead Implant Date: 20140811
Implantable Lead Location: 753859
Implantable Lead Location: 753860
Implantable Pulse Generator Implant Date: 20140811
Lead Channel Impedance Value: 360 Ohm
Lead Channel Impedance Value: 430 Ohm
Lead Channel Pacing Threshold Amplitude: 0.625 V
Lead Channel Pacing Threshold Amplitude: 0.75 V
Lead Channel Pacing Threshold Pulse Width: 0.4 ms
Lead Channel Pacing Threshold Pulse Width: 0.4 ms
Lead Channel Sensing Intrinsic Amplitude: 12 mV
Lead Channel Sensing Intrinsic Amplitude: 2.8 mV
Lead Channel Setting Pacing Amplitude: 1 V
Lead Channel Setting Pacing Amplitude: 1.625
Lead Channel Setting Pacing Pulse Width: 0.4 ms
Lead Channel Setting Sensing Sensitivity: 2 mV
Pulse Gen Model: 2240
Pulse Gen Serial Number: 7528878

## 2023-05-22 NOTE — Telephone Encounter (Signed)
   Patient Name: Alicia [name]  DOB: 1945/10/16 MRN: [id number]  Primary Cardiologist: Dr. Arlester Ladd  Clinical pharmacists have reviewed the patient's past medical history, labs, and current medications as part of preoperative protocol coverage. The following recommendations have been made:  Patient with diagnosis of afib on Eliquis   for anticoagulation.     Procedure: Rt Knee Arthroplasty  Date of procedure: 09/02/2023      CHA2DS2-VASc Score = 5   This indicates a 7.2% annual risk of stroke. The patient's score is based upon: CHF History: 0 HTN History: 1 Diabetes History: 1 Stroke History: 0 Vascular Disease History: 0 Age Score: 2 Gender Score: 1     CrCl 69 mL/min Platelet count 314 K       Per office protocol, patient can hold Eliquis  for 3 days prior to procedure.           I will route this recommendation to the requesting party via Epic fax function and remove from pre-op pool.  Please call with questions.  Friddie Jetty, NP 05/22/2023, 2:34 PM

## 2023-05-22 NOTE — Telephone Encounter (Signed)
 Patient with diagnosis of afib on Eliquis   for anticoagulation.    Procedure: Rt Knee Arthroplasty  Date of procedure: 09/02/2023    CHA2DS2-VASc Score = 5   This indicates a 7.2% annual risk of stroke. The patient's score is based upon: CHF History: 0 HTN History: 1 Diabetes History: 1 Stroke History: 0 Vascular Disease History: 0 Age Score: 2 Gender Score: 1    CrCl 69 mL/min Platelet count 314 K    Per office protocol, patient can hold Eliquis  for 3 days prior to procedure.     **This guidance is not considered finalized until pre-operative APP has relayed final recommendations.**

## 2023-05-27 ENCOUNTER — Encounter: Payer: Self-pay | Admitting: Internal Medicine

## 2023-05-29 ENCOUNTER — Encounter: Payer: Self-pay | Admitting: Cardiovascular Disease

## 2023-06-01 ENCOUNTER — Encounter: Payer: Self-pay | Admitting: Cardiovascular Disease

## 2023-06-07 ENCOUNTER — Institutional Professional Consult (permissible substitution): Payer: Medicare Other | Admitting: Internal Medicine

## 2023-06-10 ENCOUNTER — Institutional Professional Consult (permissible substitution): Payer: Medicare Other | Admitting: Diagnostic Neuroimaging

## 2023-06-11 DIAGNOSIS — M810 Age-related osteoporosis without current pathological fracture: Secondary | ICD-10-CM | POA: Diagnosis not present

## 2023-06-11 DIAGNOSIS — M25561 Pain in right knee: Secondary | ICD-10-CM | POA: Diagnosis not present

## 2023-06-11 DIAGNOSIS — M6281 Muscle weakness (generalized): Secondary | ICD-10-CM | POA: Diagnosis not present

## 2023-06-11 DIAGNOSIS — R262 Difficulty in walking, not elsewhere classified: Secondary | ICD-10-CM | POA: Diagnosis not present

## 2023-06-11 DIAGNOSIS — Z780 Asymptomatic menopausal state: Secondary | ICD-10-CM | POA: Diagnosis not present

## 2023-06-11 DIAGNOSIS — M25562 Pain in left knee: Secondary | ICD-10-CM | POA: Diagnosis not present

## 2023-06-13 DIAGNOSIS — M25562 Pain in left knee: Secondary | ICD-10-CM | POA: Diagnosis not present

## 2023-06-13 DIAGNOSIS — M6281 Muscle weakness (generalized): Secondary | ICD-10-CM | POA: Diagnosis not present

## 2023-06-13 DIAGNOSIS — R262 Difficulty in walking, not elsewhere classified: Secondary | ICD-10-CM | POA: Diagnosis not present

## 2023-06-13 DIAGNOSIS — M25561 Pain in right knee: Secondary | ICD-10-CM | POA: Diagnosis not present

## 2023-06-14 DIAGNOSIS — M4854XD Collapsed vertebra, not elsewhere classified, thoracic region, subsequent encounter for fracture with routine healing: Secondary | ICD-10-CM | POA: Diagnosis not present

## 2023-06-14 DIAGNOSIS — M546 Pain in thoracic spine: Secondary | ICD-10-CM | POA: Diagnosis not present

## 2023-06-18 DIAGNOSIS — M25561 Pain in right knee: Secondary | ICD-10-CM | POA: Diagnosis not present

## 2023-06-18 DIAGNOSIS — M6281 Muscle weakness (generalized): Secondary | ICD-10-CM | POA: Diagnosis not present

## 2023-06-18 DIAGNOSIS — M25562 Pain in left knee: Secondary | ICD-10-CM | POA: Diagnosis not present

## 2023-06-18 DIAGNOSIS — R262 Difficulty in walking, not elsewhere classified: Secondary | ICD-10-CM | POA: Diagnosis not present

## 2023-06-21 DIAGNOSIS — M6281 Muscle weakness (generalized): Secondary | ICD-10-CM | POA: Diagnosis not present

## 2023-06-21 DIAGNOSIS — M25561 Pain in right knee: Secondary | ICD-10-CM | POA: Diagnosis not present

## 2023-06-21 DIAGNOSIS — R262 Difficulty in walking, not elsewhere classified: Secondary | ICD-10-CM | POA: Diagnosis not present

## 2023-06-21 DIAGNOSIS — M25562 Pain in left knee: Secondary | ICD-10-CM | POA: Diagnosis not present

## 2023-06-25 ENCOUNTER — Telehealth: Payer: Self-pay | Admitting: Diagnostic Neuroimaging

## 2023-06-25 DIAGNOSIS — R262 Difficulty in walking, not elsewhere classified: Secondary | ICD-10-CM | POA: Diagnosis not present

## 2023-06-25 DIAGNOSIS — M25561 Pain in right knee: Secondary | ICD-10-CM | POA: Diagnosis not present

## 2023-06-25 DIAGNOSIS — M6281 Muscle weakness (generalized): Secondary | ICD-10-CM | POA: Diagnosis not present

## 2023-06-25 DIAGNOSIS — M25562 Pain in left knee: Secondary | ICD-10-CM | POA: Diagnosis not present

## 2023-06-25 NOTE — Telephone Encounter (Signed)
 Pt called to verify appointment

## 2023-06-28 DIAGNOSIS — R262 Difficulty in walking, not elsewhere classified: Secondary | ICD-10-CM | POA: Diagnosis not present

## 2023-06-28 DIAGNOSIS — M25562 Pain in left knee: Secondary | ICD-10-CM | POA: Diagnosis not present

## 2023-06-28 DIAGNOSIS — M25561 Pain in right knee: Secondary | ICD-10-CM | POA: Diagnosis not present

## 2023-06-28 DIAGNOSIS — M6281 Muscle weakness (generalized): Secondary | ICD-10-CM | POA: Diagnosis not present

## 2023-07-01 ENCOUNTER — Ambulatory Visit: Payer: Medicare Other | Admitting: Diagnostic Neuroimaging

## 2023-07-01 ENCOUNTER — Encounter: Payer: Self-pay | Admitting: Diagnostic Neuroimaging

## 2023-07-01 VITALS — BP 159/64 | HR 61 | Ht 66.0 in | Wt 233.2 lb

## 2023-07-01 DIAGNOSIS — G8929 Other chronic pain: Secondary | ICD-10-CM | POA: Diagnosis not present

## 2023-07-01 DIAGNOSIS — M79671 Pain in right foot: Secondary | ICD-10-CM | POA: Diagnosis not present

## 2023-07-01 DIAGNOSIS — E1142 Type 2 diabetes mellitus with diabetic polyneuropathy: Secondary | ICD-10-CM | POA: Diagnosis not present

## 2023-07-01 DIAGNOSIS — Z794 Long term (current) use of insulin: Secondary | ICD-10-CM | POA: Diagnosis not present

## 2023-07-01 DIAGNOSIS — M79672 Pain in left foot: Secondary | ICD-10-CM

## 2023-07-01 NOTE — Patient Instructions (Signed)
 Intermittent bilateral foot pain (patient denies pain in feet today; some arthritis and metatarsalgia; mild diabetic neuropathy, but not painful) - continue diabetes treatments per PCP - foot hygiene and precautions reviewed

## 2023-07-01 NOTE — Progress Notes (Signed)
 [facility]  PATIENT: Alicia [name] DOB: Mar 22, 1946  REFERRING CLINICIAN: Netta Cedars, MD HISTORY FROM: patient  REASON FOR VISIT: new consult   HISTORICAL  CHIEF COMPLAINT:  Chief Complaint  Patient presents with   New Patient (Initial Visit)    Pt in room 6 alone.New patient paper referral for Diabetic peripheral neuropathy. Pt walks with cane, pt said gait if off.  Pt said no burning or tingling or numbness. Pt states bilateral foot pain is not new due to history of foot surgery. Pt fall in Dec. Right knee replaced scheduled on 09/02/23    HISTORY OF PRESENT ILLNESS:   78 year old female here for evaluation of bilateral foot pain.  Patient has had several years of intermittent foot pain, mainly in the bottom of her feet related to a variety of factors including arthritis and other injuries over the years.  Has been to foot specialist who asked [place] to see patient for diabetic neuropathy evaluation.  Patient has had diabetes for several years with A1c's in the 7 range.  She denies any burning, pins-and-needles, numbness or tingling in the toes or feet.    REVIEW OF SYSTEMS: Full 14 system review of systems performed and negative with exception of: as per HPI.  ALLERGIES: Allergies  Allergen Reactions   Ace Inhibitors Cough   Cardizem [Diltiazem Hcl] Other (See Comments)    headache   Misc. Sulfonamide Containing Compounds Other (See Comments)   Oxycodone Nausea Only   Statins Other (See Comments)    myalgias   Sulfa Antibiotics Hives and Swelling   Tape     Paper tape is okay   Aspirin Rash   Cortisone Rash    flushing   Fd&C Yellow #5 (Tartrazine) Rash   Latex Rash    Sensitive not allergic    HOME MEDICATIONS: Outpatient Medications Prior to Visit  Medication Sig Dispense Refill   apixaban (ELIQUIS) 5 MG TABS tablet Take 1 tablet (5 mg total) by mouth 2 (two) times daily. 60 tablet 11   citalopram (CELEXA) 10 MG tablet Take 10 mg by  mouth daily. Mon, Wed, Fri     folic acid (FOLVITE) 1 MG tablet Take 1 mg by mouth daily.     glipiZIDE (GLUCOTROL XL) 2.5 MG 24 hr tablet Take 2.5 mg by mouth daily with breakfast.     GLOBAL INJECT EASE INSULIN SYR 31G X 5/16" 1 ML MISC Inject into the skin.     hydrochlorothiazide (HYDRODIURIL) 25 MG tablet Take 25 mg by mouth daily.      insulin NPH-regular Human (NOVOLIN 70/30) (70-30) 100 UNIT/ML injection Inject 50 Units into the skin 2 (two) times daily with a meal.     INSULIN SYRINGE 1CC/29G 29G X 1/2" 1 ML MISC      levothyroxine (SYNTHROID) 112 MCG tablet TAKE 1 TABLET BY MOUTH ONCE DAILY BEFORE BREAKFAST 90 tablet 3   Polyethyl Glycol-Propyl Glycol (SYSTANE) 0.4-0.3 % SOLN Apply to eye.     Probiotic Product (PROBIOTIC DAILY PO) Take 1 tablet by mouth daily.     gabapentin (NEURONTIN) 100 MG capsule Take 100 mg by mouth as needed. (Patient not taking: Reported on 07/01/2023)     loratadine (CLARITIN) 10 MG tablet Take 10 mg by mouth daily. (Patient not taking: Reported on 06/27/2023)     Misc Natural Products (ESSIAC TONIC) CAPS Take 2 capsules by mouth 2 (two) times daily. (Patient not taking: Reported on 06/27/2023)     No facility-administered medications prior  to visit.    PAST MEDICAL HISTORY: 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/2012   DUAL CHAMBER    DR Ladona Ridgel   Sleep apnea    No machine Cant tolerate   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   Wears glasses     PAST SURGICAL HISTORY: Past Surgical History:   Procedure Laterality Date   ANKLE SURGERY Right    multiple   BIOPSY  03/11/2023   Procedure: BIOPSY;  Surgeon: Lanelle Bal, DO;  [facility];  Service: Endoscopy;;   BREAST SURGERY     biopsy L breast benign   CHOLECYSTECTOMY     COLONOSCOPY     COLONOSCOPY WITH PROPOFOL N/A 03/11/2023   Procedure: COLONOSCOPY WITH PROPOFOL;  Surgeon: Lanelle Bal, DO;  [facility];  Service: Endoscopy;  Laterality: N/A;  915am, asa 3   EYE SURGERY     both cataracts   FOOT ARTHROTOMY     left   FOOT SURGERY     Bil  bone spurs /Left  foot neuroma removed Right foot sinus tarside surgery Right bone in toe removed   KNEE SURGERY     right   PACEMAKER INSERTION  12/15/2012   SJM 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 by Dr Ladona Ridgel for [facility] 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   PERMANENT PACEMAKER INSERTION N/A 12/15/2012   SJM 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 by Dr Ladona Ridgel for transient AV block   POLYPECTOMY  03/11/2023   Procedure: POLYPECTOMY;  Surgeon: Lanelle Bal, DO;  [facility];  Service: Endoscopy;;   ROBOTIC ASSISTED TOTAL HYSTERECTOMY WITH BILATERAL SALPINGO OOPHERECTOMY N/A 02/19/2017   Procedure: XI ROBOTIC ASSISTED TOTAL HYSTERECTOMY WITH BILATERAL SALPINGO OOPHORECTOMY;  Surgeon: Adolphus Birchwood, MD;  Location: [facility];  Service: Gynecology;  Laterality: N/A;   SENTINEL NODE BIOPSY N/A 02/19/2017   Procedure: SENTINEL NODE BIOPSY;  Surgeon: Adolphus Birchwood, MD;  Location: [facility];  Service: Gynecology;  Laterality: N/A;   SHOULDER ARTHROSCOPY WITH SUBACROMIAL DECOMPRESSION AND OPEN ROTATOR C Right 07/07/2013   Procedure: RIGHT SHOULDER ARTHROSCOPY WITH SUBACROMIAL DECOMPRESSION AND ROTATOR CUFF DEBRIDEMENT/ DISTAL CLAVICLE RESECTION;  Surgeon: Wyn Forster., MD;  Location: [facility];  Service: Orthopedics;  Laterality: Right;   SHOULDER SURGERY Right 2012    FAMILY HISTORY: Family History  Problem Relation Age of Onset   Sudden death Brother         age 17s, died while playing raquetball   Stroke Mother    Cancer Father        throat   Tongue cancer Father    Heart disease Maternal Grandmother    Cancer Maternal Aunt        type unk,hx smoking   Cancer Maternal Uncle        type unk, hx smok   Cancer Paternal Aunt 63  thinks it was GI/colon- had hole in side and colostomy bag   Cancer Maternal Aunt        type unk, had a colostomy, hx smoking   Cancer Maternal Uncle        cancer maybe-?type unk   Throat cancer Maternal Uncle        hx smoking   Heart attack Maternal Uncle 50   Cancer Cousin        type unk- died in 59's/50's   Cancer Cousin        type unk, died in 11's/50's   Breast cancer Cousin        x2   Other Other 25       brain tumor   Allergic rhinitis Neg Hx    Angioedema Neg Hx    Asthma Neg Hx    Atopy Neg Hx    Eczema Neg Hx    Immunodeficiency Neg Hx    Urticaria Neg Hx     SOCIAL HISTORY: Social History   Socioeconomic History   Marital status: Widowed    Spouse name: Not on file   Number of children: 2   Years of education: Not on file   Highest education level: Not on file  Occupational History   Occupation: retired  Tobacco Use   Smoking status: Never   Smokeless tobacco: Never  Vaping Use   Vaping status: Never Used  Substance and Sexual Activity   Alcohol use: No    Alcohol/week: 0.0 standard drinks of alcohol   Drug use: No   Sexual activity: Not Currently    Birth control/protection: Post-menopausal  Other Topics Concern   Not on file  Social History Narrative   Retired and lives in [place] with spouse      Right handed    Wear glasses    Drinks ginger ale ( sugar free)   Social Drivers of Corporate investment banker Strain: Not on file  Food Insecurity: Not on file  Transportation Needs: Not on file  Physical Activity: Not on file  Stress: Not on file  Social Connections: Not on file  Intimate Partner Violence: Not on file     PHYSICAL EXAM  GENERAL  EXAM/CONSTITUTIONAL: Vitals:  Vitals:   07/01/23 1035  BP: (!) 159/64  Pulse: 61  Weight: 233 lb 3.2 oz (105.8 kg)  Height: 5\' 6"  (1.676 m)   Body mass index is 37.64 kg/m. Wt Readings from Last 3 Encounters:  07/01/23 233 lb 3.2 oz (105.8 kg)  05/17/23 240 lb (108.9 kg)  04/25/23 240 lb 3.2 oz (109 kg)   Patient is in no distress; well developed, nourished and groomed; neck is supple  CARDIOVASCULAR: Examination of carotid arteries is normal; no carotid bruits Regular rate and rhythm, no murmurs Examination of peripheral vascular system by observation and palpation is normal  EYES: Ophthalmoscopic exam of optic discs and posterior segments is normal; no papilledema or hemorrhages No results found.  MUSCULOSKELETAL: Gait, strength, tone, movements noted in Neurologic exam below  NEUROLOGIC: MENTAL STATUS:      No data to display         awake, alert, oriented to person, place and time recent and remote memory intact normal attention and concentration language fluent, comprehension intact, naming intact fund of knowledge appropriate  CRANIAL NERVE:  2nd - no papilledema on fundoscopic exam 2nd, 3rd, 4th, 6th - pupils equal and reactive to light, visual fields full to confrontation, extraocular muscles intact, no nystagmus 5th -  facial sensation symmetric 7th - facial strength symmetric 8th - hearing intact 9th - palate elevates symmetrically, uvula midline 11th - shoulder shrug symmetric 12th - tongue protrusion midline  MOTOR:  normal bulk and tone, full strength in the BUE, BLE  SENSORY:  normal and symmetric to light touch, pinprick, temperature, vibration; EXCEPT DECR PP AND TEMP AND VIB IN TOES AND FEET COMPARED TO FINGERS  COORDINATION:  finger-nose-finger, fine finger movements normal  REFLEXES:  deep tendon reflexes TRACE and symmetric  GAIT/STATION:  narrow based gait; USING CANE     DIAGNOSTIC DATA (LABS, IMAGING, TESTING) - I reviewed  patient records, labs, notes, testing and imaging myself where available.  Lab Results  Component Value Date   WBC 6.6 01/15/2023   HGB 12.0 01/15/2023   HCT 36.0 01/15/2023   MCV 101 (H) 01/15/2023   PLT 314 01/15/2023      Component Value Date/Time   NA 136 03/06/2023 1301   NA 140 04/01/2014 1719   K 3.9 03/06/2023 1301   K 4.4 04/01/2014 1719   CL 100 03/06/2023 1301   CL 104 04/01/2014 1719   CO2 27 03/06/2023 1301   CO2 31 04/01/2014 1719   GLUCOSE 176 (H) 03/06/2023 1301   GLUCOSE 188 (H) 04/01/2014 1719   BUN 17 03/06/2023 1301   BUN 19 (H) 04/01/2014 1719   CREATININE 1.14 (H) 03/06/2023 1301   CREATININE 1.24 04/01/2014 1719   CALCIUM 8.9 03/06/2023 1301   CALCIUM 8.4 (L) 04/01/2014 1719   PROT 6.8 01/29/2017 1037   ALBUMIN 3.7 01/29/2017 1037   AST 15 01/29/2017 1037   ALT 14 01/29/2017 1037   ALKPHOS 62 01/29/2017 1037   BILITOT 1.1 01/29/2017 1037   GFRNONAA 49 (L) 03/06/2023 1301   GFRNONAA 46 (L) 04/01/2014 1719   GFRAA 49 (L) 11/10/2017 1036   GFRAA 55 (L) 04/01/2014 1719   No results found for: "CHOL", "HDL", "LDLCALC", "LDLDIRECT", "TRIG", "CHOLHDL" Lab Results  Component Value Date   HGBA1C 7.1 (H) 01/29/2017   Lab Results  Component Value Date   VITAMINB12 937 01/18/2023   Lab Results  Component Value Date   TSH 2.80 09/19/2015    Oct 2024 A1c - 7.3 (per patient)    ASSESSMENT AND PLAN  78 y.o. year old female here with:   Dx:  1. Chronic pain of both feet   2. Diabetic polyneuropathy associated with type 2 diabetes mellitus (HCC)     PLAN:  Intermittent bilateral foot pain (patient denies pain in feet today; some arthritis and metatarsalgia; mild diabetic neuropathy, but not painful) - continue diabetes treatments per PCP - foot hygiene and precautions reviewed  Return for return to PCP.    Suanne Marker, MD 07/01/2023, 11:19 AM Certified in Neurology, Neurophysiology and Neuroimaging  [facility] [address] [place], [place] [address] [phone]

## 2023-07-02 DIAGNOSIS — R262 Difficulty in walking, not elsewhere classified: Secondary | ICD-10-CM | POA: Diagnosis not present

## 2023-07-02 DIAGNOSIS — M25561 Pain in right knee: Secondary | ICD-10-CM | POA: Diagnosis not present

## 2023-07-02 DIAGNOSIS — M6281 Muscle weakness (generalized): Secondary | ICD-10-CM | POA: Diagnosis not present

## 2023-07-02 DIAGNOSIS — M25562 Pain in left knee: Secondary | ICD-10-CM | POA: Diagnosis not present

## 2023-07-02 NOTE — Progress Notes (Signed)
 Remote pacemaker transmission.

## 2023-07-02 NOTE — Addendum Note (Signed)
 Addended by: Geralyn Flash D on: 07/02/2023 11:02 AM   Modules accepted: Orders

## 2023-07-05 DIAGNOSIS — R262 Difficulty in walking, not elsewhere classified: Secondary | ICD-10-CM | POA: Diagnosis not present

## 2023-07-05 DIAGNOSIS — M6281 Muscle weakness (generalized): Secondary | ICD-10-CM | POA: Diagnosis not present

## 2023-07-05 DIAGNOSIS — M25562 Pain in left knee: Secondary | ICD-10-CM | POA: Diagnosis not present

## 2023-07-05 DIAGNOSIS — M25561 Pain in right knee: Secondary | ICD-10-CM | POA: Diagnosis not present

## 2023-07-09 DIAGNOSIS — M25561 Pain in right knee: Secondary | ICD-10-CM | POA: Diagnosis not present

## 2023-07-09 DIAGNOSIS — M25562 Pain in left knee: Secondary | ICD-10-CM | POA: Diagnosis not present

## 2023-07-09 DIAGNOSIS — R262 Difficulty in walking, not elsewhere classified: Secondary | ICD-10-CM | POA: Diagnosis not present

## 2023-07-09 DIAGNOSIS — M6281 Muscle weakness (generalized): Secondary | ICD-10-CM | POA: Diagnosis not present

## 2023-07-12 DIAGNOSIS — R262 Difficulty in walking, not elsewhere classified: Secondary | ICD-10-CM | POA: Diagnosis not present

## 2023-07-12 DIAGNOSIS — M25561 Pain in right knee: Secondary | ICD-10-CM | POA: Diagnosis not present

## 2023-07-12 DIAGNOSIS — M6281 Muscle weakness (generalized): Secondary | ICD-10-CM | POA: Diagnosis not present

## 2023-07-12 DIAGNOSIS — M25562 Pain in left knee: Secondary | ICD-10-CM | POA: Diagnosis not present

## 2023-07-16 DIAGNOSIS — R262 Difficulty in walking, not elsewhere classified: Secondary | ICD-10-CM | POA: Diagnosis not present

## 2023-07-16 DIAGNOSIS — M25561 Pain in right knee: Secondary | ICD-10-CM | POA: Diagnosis not present

## 2023-07-16 DIAGNOSIS — M6281 Muscle weakness (generalized): Secondary | ICD-10-CM | POA: Diagnosis not present

## 2023-07-16 DIAGNOSIS — M25562 Pain in left knee: Secondary | ICD-10-CM | POA: Diagnosis not present

## 2023-07-19 DIAGNOSIS — M6281 Muscle weakness (generalized): Secondary | ICD-10-CM | POA: Diagnosis not present

## 2023-07-19 DIAGNOSIS — M25561 Pain in right knee: Secondary | ICD-10-CM | POA: Diagnosis not present

## 2023-07-19 DIAGNOSIS — R262 Difficulty in walking, not elsewhere classified: Secondary | ICD-10-CM | POA: Diagnosis not present

## 2023-07-19 DIAGNOSIS — M25562 Pain in left knee: Secondary | ICD-10-CM | POA: Diagnosis not present

## 2023-07-25 DIAGNOSIS — E039 Hypothyroidism, unspecified: Secondary | ICD-10-CM | POA: Diagnosis not present

## 2023-07-25 DIAGNOSIS — E1122 Type 2 diabetes mellitus with diabetic chronic kidney disease: Secondary | ICD-10-CM | POA: Diagnosis not present

## 2023-07-25 DIAGNOSIS — E782 Mixed hyperlipidemia: Secondary | ICD-10-CM | POA: Diagnosis not present

## 2023-07-25 DIAGNOSIS — N1831 Chronic kidney disease, stage 3a: Secondary | ICD-10-CM | POA: Diagnosis not present

## 2023-07-25 DIAGNOSIS — E7849 Other hyperlipidemia: Secondary | ICD-10-CM | POA: Diagnosis not present

## 2023-07-29 DIAGNOSIS — E1151 Type 2 diabetes mellitus with diabetic peripheral angiopathy without gangrene: Secondary | ICD-10-CM | POA: Diagnosis not present

## 2023-07-29 DIAGNOSIS — M79675 Pain in left toe(s): Secondary | ICD-10-CM | POA: Diagnosis not present

## 2023-07-29 DIAGNOSIS — E1140 Type 2 diabetes mellitus with diabetic neuropathy, unspecified: Secondary | ICD-10-CM | POA: Diagnosis not present

## 2023-07-29 DIAGNOSIS — M79671 Pain in right foot: Secondary | ICD-10-CM | POA: Diagnosis not present

## 2023-07-29 DIAGNOSIS — L600 Ingrowing nail: Secondary | ICD-10-CM | POA: Diagnosis not present

## 2023-07-29 DIAGNOSIS — M79672 Pain in left foot: Secondary | ICD-10-CM | POA: Diagnosis not present

## 2023-07-29 DIAGNOSIS — M79674 Pain in right toe(s): Secondary | ICD-10-CM | POA: Diagnosis not present

## 2023-07-30 DIAGNOSIS — M25562 Pain in left knee: Secondary | ICD-10-CM | POA: Diagnosis not present

## 2023-07-30 DIAGNOSIS — M6281 Muscle weakness (generalized): Secondary | ICD-10-CM | POA: Diagnosis not present

## 2023-07-30 DIAGNOSIS — R262 Difficulty in walking, not elsewhere classified: Secondary | ICD-10-CM | POA: Diagnosis not present

## 2023-07-30 DIAGNOSIS — M25561 Pain in right knee: Secondary | ICD-10-CM | POA: Diagnosis not present

## 2023-07-30 DIAGNOSIS — M546 Pain in thoracic spine: Secondary | ICD-10-CM | POA: Diagnosis not present

## 2023-07-30 DIAGNOSIS — M4854XD Collapsed vertebra, not elsewhere classified, thoracic region, subsequent encounter for fracture with routine healing: Secondary | ICD-10-CM | POA: Diagnosis not present

## 2023-07-31 DIAGNOSIS — E039 Hypothyroidism, unspecified: Secondary | ICD-10-CM | POA: Diagnosis not present

## 2023-07-31 DIAGNOSIS — K2100 Gastro-esophageal reflux disease with esophagitis, without bleeding: Secondary | ICD-10-CM | POA: Diagnosis not present

## 2023-07-31 DIAGNOSIS — E1122 Type 2 diabetes mellitus with diabetic chronic kidney disease: Secondary | ICD-10-CM | POA: Diagnosis not present

## 2023-07-31 DIAGNOSIS — E782 Mixed hyperlipidemia: Secondary | ICD-10-CM | POA: Diagnosis not present

## 2023-07-31 DIAGNOSIS — R300 Dysuria: Secondary | ICD-10-CM | POA: Diagnosis not present

## 2023-08-01 DIAGNOSIS — R262 Difficulty in walking, not elsewhere classified: Secondary | ICD-10-CM | POA: Diagnosis not present

## 2023-08-01 DIAGNOSIS — M25561 Pain in right knee: Secondary | ICD-10-CM | POA: Diagnosis not present

## 2023-08-01 DIAGNOSIS — M25562 Pain in left knee: Secondary | ICD-10-CM | POA: Diagnosis not present

## 2023-08-01 DIAGNOSIS — M6281 Muscle weakness (generalized): Secondary | ICD-10-CM | POA: Diagnosis not present

## 2023-08-06 DIAGNOSIS — E041 Nontoxic single thyroid nodule: Secondary | ICD-10-CM | POA: Diagnosis not present

## 2023-08-06 DIAGNOSIS — M25561 Pain in right knee: Secondary | ICD-10-CM | POA: Diagnosis not present

## 2023-08-06 DIAGNOSIS — M6281 Muscle weakness (generalized): Secondary | ICD-10-CM | POA: Diagnosis not present

## 2023-08-06 DIAGNOSIS — R262 Difficulty in walking, not elsewhere classified: Secondary | ICD-10-CM | POA: Diagnosis not present

## 2023-08-06 DIAGNOSIS — M25562 Pain in left knee: Secondary | ICD-10-CM | POA: Diagnosis not present

## 2023-08-09 DIAGNOSIS — M25562 Pain in left knee: Secondary | ICD-10-CM | POA: Diagnosis not present

## 2023-08-09 DIAGNOSIS — M6281 Muscle weakness (generalized): Secondary | ICD-10-CM | POA: Diagnosis not present

## 2023-08-09 DIAGNOSIS — R262 Difficulty in walking, not elsewhere classified: Secondary | ICD-10-CM | POA: Diagnosis not present

## 2023-08-09 DIAGNOSIS — M25561 Pain in right knee: Secondary | ICD-10-CM | POA: Diagnosis not present

## 2023-08-12 DIAGNOSIS — M1711 Unilateral primary osteoarthritis, right knee: Secondary | ICD-10-CM | POA: Diagnosis not present

## 2023-08-12 DIAGNOSIS — M25561 Pain in right knee: Secondary | ICD-10-CM | POA: Diagnosis not present

## 2023-08-12 DIAGNOSIS — M25661 Stiffness of right knee, not elsewhere classified: Secondary | ICD-10-CM | POA: Diagnosis not present

## 2023-08-13 NOTE — H&P (Addendum)
 TOTAL KNEE ADMISSION H&P  Patient is being admitted for right total knee arthroplasty.  Subjective:  Chief Complaint: Right knee pain.  HPI: Alicia [name], 78 y.o. female has a history of pain and functional disability in the right knee due to arthritis and has failed non-surgical conservative treatments for greater than 12 weeks to include NSAID's and/or analgesics, viscosupplementation injections, and activity modification. Onset of symptoms was gradual, starting several years ago with gradually worsening course since that time. The patient noted no past surgery on the right knee.  Patient currently rates pain in the right knee at 7 out of 10 with activity. Patient has night pain, worsening of pain with activity and weight bearing, and pain that interferes with activities of daily living. Patient has evidence of  severe bone-on-bone arthritis in the lateral and patellofemoral compartments of the right knee  by imaging studies. There is no active infection.  Patient Active Problem List   Diagnosis Date Noted   Diarrhea 01/10/2023   Other fatigue 01/10/2023   Genetic testing 07/04/2017   Osteoarthritis of knee 06/11/2017   Family history of cancer    Family history of breast cancer    Endometrial cancer (HCC) 02/19/2017   Endometrial ca (HCC) 01/16/2017   Cervical polyp 12/12/2016   PMB (postmenopausal bleeding) 12/12/2016   Dermatitis 09/19/2015   Allergic urticaria 09/19/2015   Hypothyroidism 10/05/2014   Morbid obesity (HCC) 04/17/2014   Type 2 diabetes mellitus without complication (HCC) 10/02/2013   Disorder of bursae and tendons in shoulder region 07/07/2013   A-fib (HCC) 07/03/2013   Second degree Mobitz II AV block 03/18/2013   Syncope 03/18/2013   Essential hypertension 03/18/2013   Pacemaker 12/05/2012    Past Medical History:  Diagnosis Date   Anemia    hx of   Anxiety    Cancer (HCC)    endometrial cancer/ skin cancers removed nose non melanoma   Diabetes (HCC)     Type 2   DJD (degenerative joint disease)    Dysrhythmia    SYMPTOMATIC BRADYCARDIA  / Afib   Family history of breast cancer    Family history of cancer    Gallstones    History of radiation therapy 04/04/17-04/25/17   vaginal cuff 30 Gy in 5 fractions   Hypertension    HCTZ   Hypothyroidism    Obesity    Pacemaker 12/15/2012   DUAL CHAMBER    DR Carolynne Citron   Sleep apnea    No machine Cant tolerate   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   Wears glasses     Past Surgical History:  Procedure Laterality Date   ANKLE SURGERY Right    multiple   BIOPSY  03/11/2023   Procedure: BIOPSY;  Surgeon: Vinetta Greening, DO;  [facility];  Service: Endoscopy;;   BREAST SURGERY     biopsy L breast benign   CHOLECYSTECTOMY     COLONOSCOPY     COLONOSCOPY WITH PROPOFOL  N/A 03/11/2023   Procedure: COLONOSCOPY WITH PROPOFOL ;  Surgeon: Vinetta Greening, DO;  [facility];  Service: Endoscopy;  Laterality: N/A;  915am, asa 3   EYE SURGERY     both cataracts   FOOT ARTHROTOMY     left   FOOT SURGERY     Bil  bone spurs /Left  foot neuroma removed Right foot sinus tarside surgery Right bone in toe removed   KNEE SURGERY     right   PACEMAKER INSERTION  12/15/2012   SJM 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 by Dr Carolynne Citron for [facility] 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   PERMANENT PACEMAKER INSERTION N/A 12/15/2012   SJM 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 by Dr Carolynne Citron for transient AV block   POLYPECTOMY  03/11/2023   Procedure: POLYPECTOMY;  Surgeon: Vinetta Greening, DO;  [facility];  Service: Endoscopy;;   ROBOTIC ASSISTED TOTAL HYSTERECTOMY WITH BILATERAL SALPINGO OOPHERECTOMY N/A 02/19/2017   Procedure: XI ROBOTIC ASSISTED TOTAL HYSTERECTOMY WITH BILATERAL SALPINGO OOPHORECTOMY;  Surgeon: Alphonso Aschoff, MD;  [facility];  Service: Gynecology;  Laterality: N/A;   SENTINEL NODE BIOPSY N/A 02/19/2017   Procedure: SENTINEL NODE BIOPSY;  Surgeon: Alphonso Aschoff, MD;  [facility];   Service: Gynecology;  Laterality: N/A;   SHOULDER ARTHROSCOPY WITH SUBACROMIAL DECOMPRESSION AND OPEN ROTATOR C Right 07/07/2013   Procedure: RIGHT SHOULDER ARTHROSCOPY WITH SUBACROMIAL DECOMPRESSION AND ROTATOR CUFF DEBRIDEMENT/ DISTAL CLAVICLE RESECTION;  Surgeon: Amelie Baize., MD;  Location: [facility];  Service: Orthopedics;  Laterality: Right;   SHOULDER SURGERY Right 2012    Prior to Admission medications   Medication Sig Start Date End Date Taking? Authorizing Provider  apixaban  (ELIQUIS ) 5 MG TABS tablet Take 1 tablet (5 mg total) by mouth 2 (two) times daily. 04/22/20   Allred, Royston Cornea, MD  citalopram  (CELEXA ) 10 MG tablet Take 10 mg by mouth daily. Mon, Wed, Fri 09/25/22   [provider]  folic acid  (FOLVITE ) 1 MG tablet Take 1 mg by mouth daily.    [provider]  glipiZIDE  (GLUCOTROL  XL) 2.5 MG 24 hr tablet Take 2.5 mg by mouth daily with breakfast.    [provider]  GLOBAL INJECT EASE INSULIN  SYR 31G X 5/16" 1 ML MISC Inject into the skin. 02/23/23   [provider]  hydrochlorothiazide (HYDRODIURIL) 25 MG tablet Take 25 mg by mouth daily.  08/13/16   [provider]  insulin  NPH-regular Human (NOVOLIN 70/30) (70-30) 100 UNIT/ML injection Inject 50 Units into the skin 2 (two) times daily with a meal.    [provider]  INSULIN  SYRINGE 1CC/29G 29G X 1/2" 1 ML MISC  10/05/14   [provider]  levothyroxine  (SYNTHROID ) 112 MCG tablet TAKE 1 TABLET BY MOUTH ONCE DAILY BEFORE BREAKFAST 08/03/21   [name], MD  Polyethyl Glycol-Propyl Glycol (SYSTANE) 0.4-0.3 % SOLN Apply to eye.    [provider]  Probiotic Product (PROBIOTIC DAILY PO) Take 1 tablet by mouth daily.    [provider]    Allergies  Allergen Reactions   Ace Inhibitors Cough   Cardizem  [Diltiazem  Hcl] Other (See Comments)    headache   Misc. Sulfonamide Containing Compounds Other (See Comments)   Oxycodone   Nausea Only   Statins Other (See Comments)    myalgias   Sulfa Antibiotics Hives and Swelling   Tape     Paper tape is okay   Aspirin  Rash   Cortisone Rash    flushing   Fd&C Yellow #5 (Tartrazine) Rash   Latex Rash    Sensitive not allergic    Social History   Socioeconomic History   Marital status: Widowed    Spouse name: Not on file   Number of children: 2   Years of education: Not on file   Highest education level: Not on file  Occupational History   Occupation: retired  Tobacco Use   Smoking status: Never   Smokeless tobacco: Never  Vaping Use   Vaping  status: Never Used  Substance and Sexual Activity   Alcohol  use: No    Alcohol /week: 0.0 standard drinks of alcohol    Drug use: No   Sexual activity: Not Currently    Birth control/protection: Post-menopausal  Other Topics Concern   Not on file  Social History Narrative   Retired and lives in [place] with spouse      Right handed    Wear glasses    Drinks ginger ale ( sugar free)   Social Drivers of Corporate investment banker Strain: Not on file  Food Insecurity: Not on file  Transportation Needs: Not on file  Physical Activity: Not on file  Stress: Not on file  Social Connections: Not on file  Intimate Partner Violence: Not on file    Tobacco Use: Low Risk  (07/01/2023)   Patient History    Smoking Tobacco Use: Never    Smokeless Tobacco Use: Never    Passive Exposure: Not on file   Social History   Substance and Sexual Activity  Alcohol  Use No   Alcohol /week: 0.0 standard drinks of alcohol     Family History  Problem Relation Age of Onset   Sudden death Brother        age 87s, died while playing raquetball   Stroke Mother    Cancer Father        throat   Tongue cancer Father    Heart disease Maternal Grandmother    Cancer Maternal Aunt        type unk,hx smoking   Cancer Maternal Uncle        type unk, hx smok   Cancer Paternal Aunt 36       thinks it was GI/colon- had hole in side  and colostomy bag   Cancer Maternal Aunt        type unk, had a colostomy, hx smoking   Cancer Maternal Uncle        cancer maybe-?type unk   Throat cancer Maternal Uncle        hx smoking   Heart attack Maternal Uncle 50   Cancer Cousin        type unk- died in 39's/50's   Cancer Cousin        type unk, died in 67's/50's   Breast cancer Cousin        x2   Other Other 25       brain tumor   Allergic rhinitis Neg Hx    Angioedema Neg Hx    Asthma Neg Hx    Atopy Neg Hx    Eczema Neg Hx    Immunodeficiency Neg Hx    Urticaria Neg Hx     ROS  Objective:  Physical Exam: Well nourished and well developed.  General: Alert and oriented x3, cooperative and pleasant, no acute distress.  Head: normocephalic, atraumatic, neck supple.  Eyes: EOMI. Abdomen: non-tender to palpation and soft, normoactive bowel sounds. Musculoskeletal: - Right knee shows no effusion.  - Range of motion is 10-90 degrees.  - Significant crepitus on range of motion.  - Tenderness lateral greater than medial.  - No instability about the knee.  - Gait pattern is significantly antalgic on the right with a walker. Calves soft and nontender. Motor function intact in LE. Strength 5/5 LE bilaterally. Neuro: Distal pulses 2+. Sensation to light touch intact in LE.  Vital signs in last 24 hours: BP: ()/()  Arterial Line BP: ()/()   Imaging Review Plain radiographs demonstrate severe  degenerative joint disease of the right knee. The overall alignment is neutral. The bone quality appears to be adequate for age and reported activity level.  Assessment/Plan:  End stage arthritis, right knee   The patient history, physical examination, clinical judgment of the provider and imaging studies are consistent with end stage degenerative joint disease of the right knee and total knee arthroplasty is deemed medically necessary. The treatment options including medical management, injection therapy arthroscopy and  arthroplasty were discussed at length. The risks and benefits of total knee arthroplasty were presented and reviewed. The risks due to aseptic loosening, infection, stiffness, patella tracking problems, thromboembolic complications and other imponderables were discussed. The patient acknowledged the explanation, agreed to proceed with the plan and consent was signed. Patient is being admitted for inpatient treatment for surgery, pain control, PT, OT, prophylactic antibiotics, VTE prophylaxis, progressive ambulation and ADLs and discharge planning. The patient is planning to be discharged  home .  Patient's anticipated LOS is less than 2 midnights, meeting these requirements: - Lives within 1 hour of care - Has a competent adult at home to recover with post-op - NO history of  - Chronic pain requiring opiods  - Coronary Artery Disease  - Heart failure  - Heart attack  - Stroke  - DVT/VTE  - Respiratory Failure/COPD  - Renal failure  - Anemia  - Advanced Liver disease  Therapy Plans: [facility] Disposition: Home with Daughter Planned DVT Prophylaxis: Eliquis  (hx a fib) DME Needed: RW PCP: [name], MD (clearance received) Cardiologist: Mealor, MD (clearance received) TXA: IV Allergies: **multiple** - latex (rash), aspirin  (rash), cortisone (flushing), oxy (nausea), sulfa (rash), ACE inhibitors (cough) Anesthesia Concerns: None BMI: 37.4 Last HgbA1c: 7.6% - will recheck with pre-op labs  Pharmacy: [facility]  Other: -Per cardiologist, hold Eliquis  3 days prior to surgery  - Patient was instructed on what medications to stop prior to surgery. - Follow-up visit in 2 weeks with Dr. France Ina - Begin physical therapy following surgery - Pre-operative lab work as pre-surgical testing - Prescriptions will be provided in hospital at time of discharge  R. Brinton Canavan, PA-C Orthopedic Surgery [facility] [place]

## 2023-08-16 DIAGNOSIS — M25561 Pain in right knee: Secondary | ICD-10-CM | POA: Diagnosis not present

## 2023-08-16 DIAGNOSIS — M25562 Pain in left knee: Secondary | ICD-10-CM | POA: Diagnosis not present

## 2023-08-16 DIAGNOSIS — M6281 Muscle weakness (generalized): Secondary | ICD-10-CM | POA: Diagnosis not present

## 2023-08-16 DIAGNOSIS — R262 Difficulty in walking, not elsewhere classified: Secondary | ICD-10-CM | POA: Diagnosis not present

## 2023-08-18 NOTE — Progress Notes (Signed)
 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 Spinal Cord Stimulator:[]  No []  Yes       History of Sleep Apnea? []  No [x]  Yes   CPAP used?- [x]  No []  Yes    Does the patient monitor blood sugar?   []  N/A   [x]  No []  Yes  Patient has: []  NO Hx DM   []  Pre-DM   []  DM1  [x]   DM2 Last A1c was: 7.6  on  07-25-23,  Dr. France Ina ordered a repeat done at PST    Does patient have a Freestyle Libre or Dexcom? [x]  No []  Yes   Fasting Blood Sugar Ranges- 100-125  Checks Blood Sugar 1 times a day  Glipizide (Glucotrol XL)- 1 tab po q am, Insulin (Humulin 70/30 ) Inject 50 units bid with meals  Blood Thinner / Instructions:  Eliquis - Hold x 3 days  Last dose will be taken on Thursday 08-29-23  Aspirin Instructions: none  ERAS Protocol Ordered: []  No  [x]  Yes PRE-SURGERY []  ENSURE  [x]  G2   Patient is to be NPO after: 0415  Dental hx: []  Dentures:  [x]  N/A      []  Bridge or Partial:                   []  Loose or Damaged teeth:   Comments: Patient was given the 5 CHG shower / bath instructions for THA / TKA / Total or Reverse Shoulder arthroplasty surgery along with 2 bottles of the CHG soap. Patient will start this on:  08-29-2023        Activity level: Patient is able to climb a flight of stairs without difficulty; [x]  No CP  [x]  No SOB, but would have leg pain, Patient can perform ADLs without assistance.   Anesthesia review: uncontrolled DM2, OSA- no CPAP, A. Fib,  has PPM for Mobitz II AVB / Syncope (12-16-2022 by Dr.Greg Carolynne Citron), GAD, has Thoracic compression fracture from Fall,    Patient denies shortness of breath, fever, cough and chest pain at PAT appointment.  Patient verbalized understanding and agreement to the Pre-Surgical Instructions that were given to them at this PAT appointment. Patient was also educated of the need to review these PAT instructions again prior to her surgery.I reviewed the appropriate phone numbers to call if they have any and questions or concerns.

## 2023-08-18 NOTE — Patient Instructions (Addendum)
 SURGICAL WAITING ROOM VISITATION Patients having surgery or a procedure may have no more than 2 support people in the waiting area - these visitors may rotate in the visitor waiting room.   If the patient needs to stay at the hospital during part of their recovery, the visitor guidelines for inpatient rooms apply.  PRE-OP VISITATION  Pre-op nurse will coordinate an appropriate time for 1 support person to accompany the patient in pre-op.  This support person may not rotate.  This visitor will be contacted when the time is appropriate for the visitor to come back in the pre-op area.  Please refer to the [facility] website for the visitor guidelines for Inpatients (after your surgery is over and you are in a regular room).  You are not required to quarantine at this time prior to your surgery. However, you must do this: Hand Hygiene often Do NOT share personal items Notify your provider if you are in close contact with someone who has COVID or you develop fever 100.4 or greater, new onset of sneezing, cough, sore throat, shortness of breath or body aches.  If you test positive for Covid or have been in contact with anyone that has tested positive in the last 10 days please notify you surgeon.    Your procedure is scheduled on:  MONDAY  September 02, 2023  Report to [facility] Main Entrance: [address] entrance where the [employer] is available.   Report to admitting at: 05:15    AM  Call this number if you have any questions or problems the morning of surgery [phone]  Do not eat food after Midnight the night prior to your surgery/procedure.  After Midnight you may have the following liquids until  04:15 AM DAY OF SURGERY  Clear Liquid Diet Water Black Coffee (sugar ok, NO MILK/CREAM OR CREAMERS)  Tea (sugar ok, NO MILK/CREAM OR CREAMERS) regular and decaf                             Plain Jell-O  with no fruit (NO RED)                                           Fruit ices  (not with fruit pulp, NO RED)                                     Popsicles (NO RED)                                                                  Juice: NO CITRUS JUICES: only apple, WHITE grape, WHITE cranberry Sports drinks like Gatorade or Powerade (NO RED)                   The day of surgery:  Drink ONE (1) Pre-Surgery G2 at    04:15 AM the morning of surgery. Drink in one sitting. Do not sip.  This drink was given to you during your hospital pre-op appointment visit. Nothing else to drink after  completing the Pre-Surgery  G2 : No candy, chewing gum or throat lozenges.    FOLLOW ANY ADDITIONAL PRE OP INSTRUCTIONS YOU RECEIVED FROM YOUR SURGEON'S OFFICE!!!   Oral Hygiene is also important to reduce your risk of infection.        Remember - BRUSH YOUR TEETH THE MORNING OF SURGERY WITH YOUR REGULAR TOOTHPASTE  Do NOT smoke after Midnight the night before surgery.  STOP TAKING all Vitamins, Herbs and supplements 1 week before your surgery.   How to Manage Your Diabetes Before and After Surgery  Why is it important to control my blood sugar before and after surgery? Improving blood sugar levels before and after surgery helps healing and can limit problems. A way of improving blood sugar control is eating a healthy diet by:  Eating less sugar and carbohydrates  Increasing activity/exercise  Talking with your doctor about reaching your blood sugar goals High blood sugars (greater than 180 mg/dL) can raise your risk of infections and slow your recovery, so you will need to focus on controlling your diabetes during the weeks before surgery. Make sure that the doctor who takes care of your diabetes knows about your planned surgery including the date and location.  How do I manage my blood sugar before surgery? Check your blood sugar at least 4 times a day, starting 2 days before surgery, to make sure that the level is not too high or low. Check your blood sugar the morning of your  surgery when you wake up and every 2 hours until you get to the Short Stay unit. If your blood sugar is less than 70 mg/dL, you will need to treat for low blood sugar: Do not take insulin. Treat a low blood sugar (less than 70 mg/dL) with  cup of clear juice (cranberry or apple), 4 glucose tablets, OR glucose gel. Recheck blood sugar in 15 minutes after treatment (to make sure it is greater than 70 mg/dL). If your blood sugar is not greater than 70 mg/dL on recheck, call [phone] for further instructions. Report your blood sugar to the short stay nurse when you get to Short Stay.  If you are admitted to the hospital after surgery: Your blood sugar will be checked by the staff and you will probably be given insulin after surgery (instead of oral diabetes medicines) to make sure you have good blood sugar levels. The goal for blood sugar control after surgery is 80-180 mg/dL.   WHAT DO I DO ABOUT MY DIABETES MEDICATION? IF you have any questions, call the nurse at [phone]  Glipizide (Glucotrol XL)- 1 tab po q am, Day BEFORE surgery: take dose in the morning.  DAY OF SURGERY: DO NOT TAKE GLIPIZIDE.   Insulin (Humulin 70/30 ) Inject 50 units bid with meals-  Day BEFORE Surgery: take 100% or 50 units of Humulin that morning. Take 70% of you usual dose or 35% units at evening dose.   DAY OF SURGERY: DO NOT TAKE INSULIN   DO NOT TAKE hydrochlorothiazide (BP medication)   Eliquis - Hold x 72 hours  Last dose will be taken on Thursday 08-29-23.    Take ONLY these medicines the morning of surgery with A SIP OF WATER: levothyroxine, loratadine. You may use your Eye Drops if needed.                    You may not have any metal on your body including hair pins, jewelry, and body piercing  Do not  wear make-up, lotions, powders, perfumes  or deodorant  Do not wear nail polish including gel and S&S, artificial / acrylic nails, or any other type of covering on natural nails including finger and  toenails. If you have artificial nails, gel coating, etc., that needs to be removed by a nail salon, Please have this removed prior to surgery. Not doing so may mean that your surgery could be cancelled or delayed if the Surgeon or anesthesia staff feels like they are unable to monitor you safely.   Do not shave 48 hours prior to surgery to avoid nicks in your skin which may contribute to postoperative infections.   Contacts, Hearing Aids, dentures or bridgework may not be worn into surgery. DENTURES WILL BE REMOVED PRIOR TO SURGERY PLEASE DO NOT APPLY "Poly grip" OR ADHESIVES!!!  You may bring a small overnight bag with you on the day of surgery, only pack items that are not valuable. [facility] IS NOT RESPONSIBLE   FOR VALUABLES THAT ARE LOST OR STOLEN.   Do not bring your home medications to the hospital. The Pharmacy will dispense medications listed on your medication list to you during your admission in the Hospital.  Special Instructions: Bring a copy of your healthcare power of attorney and living will documents the day of surgery, if you wish to have them scanned into your [facility]- EPIC  Please read over the following fact sheets you were given: IF YOU HAVE QUESTIONS ABOUT YOUR PRE-OP INSTRUCTIONS, PLEASE CALL [phone].     Pre-operative 5 CHG Bath Instructions   You can play a key role in reducing the risk of infection after surgery. Your skin needs to be as free of germs as possible. You can reduce the number of germs on your skin by washing with CHG (chlorhexidine gluconate) soap before surgery. CHG is an antiseptic soap that kills germs and continues to kill germs even after washing.   DO NOT use if you have an allergy to chlorhexidine/CHG or antibacterial soaps. If your skin becomes reddened or irritated, stop using the CHG and notify one of our RNs at [phone]  Please shower with the CHG soap starting 4 days before surgery using the following schedule:  START SHOWERS ON   THURSDAY  August 29, 2023                                                                                                                                                                              Please keep in mind the following:  DO NOT shave, including legs and underarms, starting the day of your first shower.   You may shave your face at any point before/day of surgery.  Place clean sheets on your bed the day you start using CHG soap. Use a clean washcloth (not used since being washed) for each shower. DO NOT sleep with pets once you start using the CHG.   CHG Shower Instructions:  If you choose to wash your hair and private area, wash first with your normal shampoo/soap.  After you use shampoo/soap, rinse your hair and body thoroughly to remove shampoo/soap residue.  Turn the water OFF and apply about 3 tablespoons (45 ml) of CHG soap to a CLEAN washcloth.  Apply CHG soap ONLY FROM YOUR NECK DOWN TO YOUR TOES (washing for 3-5 minutes)  DO NOT use CHG soap on face, private areas, open wounds, or sores.  Pay special attention to the area where your surgery is being performed.  If you are having back surgery, having someone wash your back for you may be helpful.  Wait 2 minutes after CHG soap is applied, then you may rinse off the CHG soap.  Pat dry with a clean towel  Put on clean clothes/pajamas   If you choose to wear lotion, please use ONLY the CHG-compatible lotions on the back of this paper.     Additional instructions for the day of surgery: DO NOT APPLY any lotions, deodorants, cologne, or perfumes.   Put on clean/comfortable clothes.  Brush your teeth.  Ask your nurse before applying any prescription medications to the skin.      CHG Compatible Lotions   Aveeno Moisturizing lotion  Cetaphil Moisturizing Cream  Cetaphil Moisturizing Lotion  Clairol Herbal Essence Moisturizing Lotion, Dry Skin  Clairol Herbal Essence Moisturizing Lotion, Extra  Dry Skin  Clairol Herbal Essence Moisturizing Lotion, Normal Skin  Curel Age Defying Therapeutic Moisturizing Lotion with Alpha Hydroxy  Curel Extreme Care Body Lotion  Curel Soothing Hands Moisturizing Hand Lotion  Curel Therapeutic Moisturizing Cream, Fragrance-Free  Curel Therapeutic Moisturizing Lotion, Fragrance-Free  Curel Therapeutic Moisturizing Lotion, Original Formula  Eucerin Daily Replenishing Lotion  Eucerin Dry Skin Therapy Plus Alpha Hydroxy Crme  Eucerin Dry Skin Therapy Plus Alpha Hydroxy Lotion  Eucerin Original Crme  Eucerin Original Lotion  Eucerin Plus Crme Eucerin Plus Lotion  Eucerin TriLipid Replenishing Lotion  Keri Anti-Bacterial Hand Lotion  Keri Deep Conditioning Original Lotion Dry Skin Formula Softly Scented  Keri Deep Conditioning Original Lotion, Fragrance Free Sensitive Skin Formula  Keri Lotion Fast Absorbing Fragrance Free Sensitive Skin Formula  Keri Lotion Fast Absorbing Softly Scented Dry Skin Formula  Keri Original Lotion  Keri Skin Renewal Lotion Keri Silky Smooth Lotion  Keri Silky Smooth Sensitive Skin Lotion  Nivea Body Creamy Conditioning Oil  Nivea Body Extra Enriched Lotion  Nivea Body Original Lotion  Nivea Body Sheer Moisturizing Lotion Nivea Crme  Nivea Skin Firming Lotion  NutraDerm 30 Skin Lotion  NutraDerm Skin Lotion  NutraDerm Therapeutic Skin Cream  NutraDerm Therapeutic Skin Lotion  ProShield Protective Hand Cream  Provon moisturizing lotion   FAILURE TO FOLLOW THESE INSTRUCTIONS MAY RESULT IN THE CANCELLATION OF YOUR SURGERY  PATIENT SIGNATURE_________________________________  NURSE SIGNATURE__________________________________  ________________________________________________________________________        Benjamen Brand    An incentive spirometer is a tool that can help keep your lungs clear and active. This tool measures how well you are filling your lungs with each breath. Taking long deep  breaths may help reverse or decrease the chance of developing breathing (pulmonary) problems (especially infection) following: A long period of time when you are unable to move or be active. BEFORE THE PROCEDURE  If the spirometer includes an indicator to show your best effort, your nurse or respiratory therapist will set it to a desired goal. If possible, sit up straight or lean slightly forward. Try not to slouch. Hold the incentive spirometer in an upright position. INSTRUCTIONS FOR USE  Sit on the edge of your bed if possible, or sit up as far as you can in bed or on a chair. Hold the incentive spirometer in an upright position. Breathe out normally. Place the mouthpiece in your mouth and seal your lips tightly around it. Breathe in slowly and as deeply as possible, raising the piston or the ball toward the top of the column. Hold your breath for 3-5 seconds or for as long as possible. Allow the piston or ball to fall to the bottom of the column. Remove the mouthpiece from your mouth and breathe out normally. Rest for a few seconds and repeat Steps 1 through 7 at least 10 times every 1-2 hours when you are awake. Take your time and take a few normal breaths between deep breaths. The spirometer may include an indicator to show your best effort. Use the indicator as a goal to work toward during each repetition. After each set of 10 deep breaths, practice coughing to be sure your lungs are clear. If you have an incision (the cut made at the time of surgery), support your incision when coughing by placing a pillow or rolled up towels firmly against it. Once you are able to get out of bed, walk around indoors and cough well. You may stop using the incentive spirometer when instructed by your caregiver.  RISKS AND COMPLICATIONS Take your time so you do not get dizzy or light-headed. If you are in pain, you may need to take or ask for pain medication before doing incentive spirometry. It is harder  to take a deep breath if you are having pain. AFTER USE Rest and breathe slowly and easily. It can be helpful to keep track of a log of your progress. Your caregiver can provide you with a simple table to help with this. If you are using the spirometer at home, follow these instructions: SEEK MEDICAL CARE IF:  You are having difficultly using the spirometer. You have trouble using the spirometer as often as instructed. Your pain medication is not giving enough relief while using the spirometer. You develop fever of 100.5 F (38.1 C) or higher.                                                                                                    SEEK IMMEDIATE MEDICAL CARE IF:  You cough up bloody sputum that had not been present before. You develop fever of 102 F (38.9 C) or greater. You develop worsening pain at or near the incision site. MAKE SURE YOU:  Understand these instructions. Will watch your condition. Will get help right away if you are not doing well or get worse. Document Released: 09/03/2006 Document Revised: 07/16/2011 Document Reviewed: 11/04/2006 [facility] Patient Information 2014 [place], [place].        WHAT  IS A BLOOD TRANSFUSION? Blood Transfusion Information  A transfusion is the replacement of blood or some of its parts. Blood is made up of multiple cells which provide different functions. Red blood cells carry oxygen and are used for blood loss replacement. White blood cells fight against infection. Platelets control bleeding. Plasma helps clot blood. Other blood products are available for specialized needs, such as hemophilia or other clotting disorders. BEFORE THE TRANSFUSION  Who gives blood for transfusions?  Healthy volunteers who are fully evaluated to make sure their blood is safe. This is blood bank blood. Transfusion therapy is the safest it has ever been in the practice of medicine. Before blood is taken from a donor, a complete history is taken to  make sure that person has no history of diseases nor engages in risky social behavior (examples are intravenous drug use or sexual activity with multiple partners). The donor's travel history is screened to minimize risk of transmitting infections, such as malaria. The donated blood is tested for signs of infectious diseases, such as HIV and hepatitis. The blood is then tested to be sure it is compatible with you in order to minimize the chance of a transfusion reaction. If you or a relative donates blood, this is often done in anticipation of surgery and is not appropriate for emergency situations. It takes many days to process the donated blood. RISKS AND COMPLICATIONS Although transfusion therapy is very safe and saves many lives, the main dangers of transfusion include:  Getting an infectious disease. Developing a transfusion reaction. This is an allergic reaction to something in the blood you were given. Every precaution is taken to prevent this. The decision to have a blood transfusion has been considered carefully by your caregiver before blood is given. Blood is not given unless the benefits outweigh the risks. AFTER THE TRANSFUSION Right after receiving a blood transfusion, you will usually feel much better and more energetic. This is especially true if your red blood cells have gotten low (anemic). The transfusion raises the level of the red blood cells which carry oxygen, and this usually causes an energy increase. The nurse administering the transfusion will monitor you carefully for complications. HOME CARE INSTRUCTIONS  No special instructions are needed after a transfusion. You may find your energy is better. Speak with your caregiver about any limitations on activity for underlying diseases you may have. SEEK MEDICAL CARE IF:  Your condition is not improving after your transfusion. You develop redness or irritation at the intravenous (IV) site. SEEK IMMEDIATE MEDICAL CARE IF:  Any of  the following symptoms occur over the next 12 hours: Shaking chills. You have a temperature by mouth above 102 F (38.9 C), not controlled by medicine. Chest, back, or muscle pain. People around you feel you are not acting correctly or are confused. Shortness of breath or difficulty breathing. Dizziness and fainting. You get a rash or develop hives. You have a decrease in urine output. Your urine turns a dark color or changes to pink, red, or brown. Any of the following symptoms occur over the next 10 days: You have a temperature by mouth above 102 F (38.9 C), not controlled by medicine. Shortness of breath. Weakness after normal activity. The white part of the eye turns yellow (jaundice). You have a decrease in the amount of urine or are urinating less often. Your urine turns a dark color or changes to pink, red, or brown. Document Released: 04/20/2000 Document Revised: 07/16/2011 Document Reviewed: 12/08/2007 [employer] Patient  Information 2014 [employer], [place].  _______________________________________________________________________         If you would like to see a video about joint replacement:   [email or web address]

## 2023-08-19 ENCOUNTER — Ambulatory Visit (INDEPENDENT_AMBULATORY_CARE_PROVIDER_SITE_OTHER): Payer: Medicare Other

## 2023-08-19 DIAGNOSIS — I441 Atrioventricular block, second degree: Secondary | ICD-10-CM | POA: Diagnosis not present

## 2023-08-20 ENCOUNTER — Other Ambulatory Visit: Payer: Self-pay

## 2023-08-20 ENCOUNTER — Encounter (HOSPITAL_COMMUNITY)
Admission: RE | Admit: 2023-08-20 | Discharge: 2023-08-20 | Disposition: A | Discharge status: Home / Self Care | Source: Ambulatory Visit | Attending: Orthopedic Surgery | Admitting: Orthopedic Surgery

## 2023-08-20 ENCOUNTER — Encounter (HOSPITAL_COMMUNITY): Payer: Self-pay

## 2023-08-20 VITALS — BP 162/62 | HR 60 | Temp 98.7°F | Resp 20 | Ht 65.5 in | Wt 229.0 lb

## 2023-08-20 DIAGNOSIS — Z01812 Encounter for preprocedural laboratory examination: Secondary | ICD-10-CM | POA: Insufficient documentation

## 2023-08-20 DIAGNOSIS — E119 Type 2 diabetes mellitus without complications: Secondary | ICD-10-CM | POA: Diagnosis not present

## 2023-08-20 DIAGNOSIS — Z794 Long term (current) use of insulin: Secondary | ICD-10-CM | POA: Insufficient documentation

## 2023-08-20 DIAGNOSIS — M1711 Unilateral primary osteoarthritis, right knee: Secondary | ICD-10-CM

## 2023-08-20 DIAGNOSIS — Z01818 Encounter for other preprocedural examination: Secondary | ICD-10-CM

## 2023-08-20 DIAGNOSIS — I480 Paroxysmal atrial fibrillation: Secondary | ICD-10-CM

## 2023-08-20 LAB — CUP PACEART REMOTE DEVICE CHECK
Battery Remaining Longevity: 17 mo
Battery Remaining Percentage: 13 %
Battery Voltage: 2.84 V
Brady Statistic AP VP Percent: 1 %
Brady Statistic AP VS Percent: 50 %
Brady Statistic AS VP Percent: 1 %
Brady Statistic AS VS Percent: 49 %
Brady Statistic RA Percent Paced: 46 %
Brady Statistic RV Percent Paced: 1 %
Date Time Interrogation Session: 20250414210410
Implantable Lead Connection Status: 753985
Implantable Lead Connection Status: 753985
Implantable Lead Implant Date: 20140811
Implantable Lead Implant Date: 20140811
Implantable Lead Location: 753859
Implantable Lead Location: 753860
Implantable Pulse Generator Implant Date: 20140811
Lead Channel Impedance Value: 350 Ohm
Lead Channel Impedance Value: 430 Ohm
Lead Channel Pacing Threshold Amplitude: 0.5 V
Lead Channel Pacing Threshold Amplitude: 0.75 V
Lead Channel Pacing Threshold Pulse Width: 0.4 ms
Lead Channel Pacing Threshold Pulse Width: 0.4 ms
Lead Channel Sensing Intrinsic Amplitude: 12 mV
Lead Channel Sensing Intrinsic Amplitude: 3.1 mV
Lead Channel Setting Pacing Amplitude: 1 V
Lead Channel Setting Pacing Amplitude: 1.5 V
Lead Channel Setting Pacing Pulse Width: 0.4 ms
Lead Channel Setting Sensing Sensitivity: 2 mV
Pulse Gen Model: 2240
Pulse Gen Serial Number: 7528878

## 2023-08-20 LAB — SURGICAL PCR SCREEN
MRSA, PCR: NEGATIVE
Staphylococcus aureus: NEGATIVE

## 2023-08-20 LAB — BASIC METABOLIC PANEL WITH GFR
Anion gap: 8 (ref 5–15)
BUN: 19 mg/dL (ref 8–23)
CO2: 29 mmol/L (ref 22–32)
Calcium: 9.1 mg/dL (ref 8.9–10.3)
Chloride: 101 mmol/L (ref 98–111)
Creatinine, Ser: 1.15 mg/dL — ABNORMAL HIGH (ref 0.44–1.00)
GFR, Estimated: 48 mL/min — ABNORMAL LOW (ref >60)
Glucose, Bld: 133 mg/dL — ABNORMAL HIGH (ref 70–99)
Potassium: 3.8 mmol/L (ref 3.5–5.1)
Sodium: 138 mmol/L (ref 135–145)

## 2023-08-20 LAB — CBC
HCT: 35.2 % — ABNORMAL LOW (ref 36.0–46.0)
Hemoglobin: 11.7 g/dL — ABNORMAL LOW (ref 12.0–15.0)
MCH: 33.9 pg (ref 26.0–34.0)
MCHC: 33.2 g/dL (ref 30.0–36.0)
MCV: 102 fL — ABNORMAL HIGH (ref 80.0–100.0)
Platelets: 290 10*3/uL (ref 150–400)
RBC: 3.45 MIL/uL — ABNORMAL LOW (ref 3.87–5.11)
RDW: 14 % (ref 11.5–15.5)
WBC: 6.5 10*3/uL (ref 4.0–10.5)
nRBC: 0.3 % — ABNORMAL HIGH (ref 0.0–0.2)

## 2023-08-20 LAB — GLUCOSE, CAPILLARY: Glucose-Capillary: 115 mg/dL — ABNORMAL HIGH (ref 70–99)

## 2023-08-21 ENCOUNTER — Encounter: Payer: Self-pay | Admitting: Cardiovascular Disease

## 2023-08-21 DIAGNOSIS — R262 Difficulty in walking, not elsewhere classified: Secondary | ICD-10-CM | POA: Diagnosis not present

## 2023-08-21 DIAGNOSIS — M25562 Pain in left knee: Secondary | ICD-10-CM | POA: Diagnosis not present

## 2023-08-21 DIAGNOSIS — M25561 Pain in right knee: Secondary | ICD-10-CM | POA: Diagnosis not present

## 2023-08-21 DIAGNOSIS — M6281 Muscle weakness (generalized): Secondary | ICD-10-CM | POA: Diagnosis not present

## 2023-08-21 LAB — HEMOGLOBIN A1C
Hgb A1c MFr Bld: 7.6 % — ABNORMAL HIGH (ref 4.8–5.6)
Mean Plasma Glucose: 171 mg/dL

## 2023-08-21 NOTE — Progress Notes (Signed)
 Device orders on chart.

## 2023-08-21 NOTE — Progress Notes (Signed)
 PERIOPERATIVE PRESCRIPTION FOR IMPLANTED CARDIAC DEVICE PROGRAMMING  Patient Information: Name:  Alicia [name]  DOB:  1946/02/05  MRN:  [id number]    Planned Procedure: Right total Knee arthroplasty  Surgeon:  Liliane Rei  Date of Procedure:  09-02-23  Cautery will be used.N/A  Position during surgery:  N/A   Please send documentation back to:  Maryan Smalling (Fax # [phone])  Device Information:  Clinic EP Physician:  Dr. Marceil Sensor   Device Type:  Pacemaker Manufacturer and Phone #:  St. Jude/[employer]ott: [phone] Pacemaker Dependent?:  No. Date of Last Device Check:  08/19/2023 Normal Device Function?:  Yes.    Electrophysiologist's Recommendations:  Have magnet available. Provide continuous ECG monitoring when magnet is used or reprogramming is to be performed.  Procedure should not interfere with device function.  No device programming or magnet placement needed.  Per Device Clinic Standing Orders, Glorianne Largo, RN  1:18 PM 08/21/2023

## 2023-08-26 DIAGNOSIS — M25561 Pain in right knee: Secondary | ICD-10-CM | POA: Diagnosis not present

## 2023-08-26 DIAGNOSIS — M25562 Pain in left knee: Secondary | ICD-10-CM | POA: Diagnosis not present

## 2023-08-26 DIAGNOSIS — R262 Difficulty in walking, not elsewhere classified: Secondary | ICD-10-CM | POA: Diagnosis not present

## 2023-08-26 DIAGNOSIS — M6281 Muscle weakness (generalized): Secondary | ICD-10-CM | POA: Diagnosis not present

## 2023-08-27 ENCOUNTER — Encounter (HOSPITAL_COMMUNITY): Payer: Self-pay

## 2023-08-27 DIAGNOSIS — L2084 Intrinsic (allergic) eczema: Secondary | ICD-10-CM | POA: Diagnosis not present

## 2023-08-27 DIAGNOSIS — L280 Lichen simplex chronicus: Secondary | ICD-10-CM | POA: Diagnosis not present

## 2023-08-27 NOTE — Progress Notes (Signed)
 Case: [id number] Date/Time: 09/02/23 0700   Procedure: ARTHROPLASTY, KNEE, TOTAL (Right: Knee)   Anesthesia type: Choice   Pre-op diagnosis: right knee osteoarthritis   Location: [facility]OM 09 / [facility]   Surgeons: Liliane Rei, MD       DISCUSSION: Alicia [name] is a 78 yo female who presents to PAT prior to surgery above. 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 She was last seen in clinic on 05/17/23 by Dr. Arlester Ladd. Noted to be doing well at that visit and without complaints. Pacemaker function noted to be normal. Tolerating Eliquis . BP controlled. Advised f/u in 1 year.   LD Eliquis : 4/24  Device orders:  Electrophysiologist's Recommendations:   Have magnet available. Provide continuous ECG monitoring when magnet is used or reprogramming is to be performed.  Procedure should not interfere with device function.  No device programming or magnet placement needed.  Patient had a fall on 04/09/23 and sustained a T12 fracture which was treated conservatively with a TLSO brace.  Medical clearance signed (scanned in media on 08/16/23) that patient is cleared as low risk.   VS: BP (!) 162/62 Comment: left arm sitting  Pulse 60   Temp 37.1 C   Resp 20   Ht 5' 5.5" (1.664 m)   Wt 103.9 kg   SpO2 95%   BMI 37.53 kg/m   PROVIDERS: Leesa Pulling, MD   LABS: Labs reviewed: Acceptable for surgery. (all labs ordered are listed, but only abnormal results are displayed)  Labs Reviewed  HEMOGLOBIN A1C - Abnormal; Notable for the following components:      Result Value   Hgb A1c MFr Bld 7.6 (*)    All other components within normal limits  BASIC METABOLIC PANEL WITH GFR - Abnormal; Notable for the following components:   Glucose, Bld 133 (*)    Creatinine, Ser 1.15 (*)    GFR,  Estimated 48 (*)    All other components within normal limits  CBC - Abnormal; Notable for the following components:   RBC 3.45 (*)    Hemoglobin 11.7 (*)    HCT 35.2 (*)    MCV 102.0 (*)    nRBC 0.3 (*)    All other components within normal limits  GLUCOSE, CAPILLARY - Abnormal; Notable for the following components:   Glucose-Capillary 115 (*)    All other components within normal limits  SURGICAL PCR SCREEN     IMAGES: CT Thoracic spine 04/09/23:  IMPRESSION: 1. Acute minimally displaced fracture of the anterior continuous osteophytes as well as superior endplate at the T12 level. 2. No acute displaced fracture or traumatic listhesis of the cervical and lumbar spine. 3. Question right hypodense thyroid  gland nodule. Recommend thyroid  US  (ref: J Am Coll Radiol. 2015 Feb;12(2): 143-50).  EKG:   CV:  Device check 08/19/23:  Scheduled remote reviewed. Normal device function.  Presenting rhythm: SR, frequent PACs. 3 AHR detections, longest 1.5 hours   Echo 12/14/2012: Study Conclusions   - Left ventricle: The cavity size was normal. Wall thickness    was normal. Systolic function was normal. The estimated    ejection fraction was in the range of 60% to 65%. Wall    motion was normal; there were no regional wall motion    abnormalities.  Doppler parameters are consistent with    abnormal left ventricular relaxation (grade 1 diastolic    dysfunction).  - Mitral valve: Calcified annulus.  - Left atrium: The atrium was mildly dilated.   Past Medical History:  Diagnosis Date   Anemia    hx of   Anxiety    Cancer (HCC)    endometrial cancer/ skin cancers removed nose non melanoma   Diabetes (HCC)    Type 2   DJD (degenerative joint disease)    Dysrhythmia    SYMPTOMATIC BRADYCARDIA  / A. FIB   Family history of breast cancer    Family history of cancer    Gallstones    History of radiation therapy 04/04/17-04/25/17   vaginal cuff 30 Gy in 5 fractions    Hypertension    HCTZ   Hypothyroidism    Obesity    Pacemaker 12/15/2012   DUAL CHAMBER    DR Carolynne Citron   Sleep apnea    No machine Cant tolerate   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   Wears glasses     Past Surgical History:  Procedure Laterality Date   ANKLE SURGERY Right    multiple   BIOPSY  03/11/2023   Procedure: BIOPSY;  Surgeon: Vinetta Greening, DO;  [facility];  Service: Endoscopy;;   BREAST SURGERY     biopsy L breast benign   CHOLECYSTECTOMY     Laparoscopic but converted to open  d/t large stones   COLONOSCOPY     COLONOSCOPY WITH PROPOFOL  N/A 03/11/2023   Procedure: COLONOSCOPY WITH PROPOFOL ;  Surgeon: Vinetta Greening, DO;  [facility];  Service: Endoscopy;  Laterality: N/A;  915am, asa 3   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    EYE SURGERY     both cataracts   FOOT ARTHROTOMY     left   FOOT SURGERY     Bil  bone spurs /Left  foot neuroma removed Right foot sinus tarside surgery Right bone in toe removed   KNEE SURGERY     right   PACEMAKER INSERTION  12/15/2012   SJM 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 by Dr Carolynne Citron for 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   PERMANENT PACEMAKER INSERTION N/A 12/15/2012   SJM 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 by Dr Carolynne Citron for transient AV block   POLYPECTOMY  03/11/2023   Procedure: POLYPECTOMY;  Surgeon: Vinetta Greening, DO;  [facility];  Service: Endoscopy;;   ROBOTIC ASSISTED TOTAL HYSTERECTOMY WITH BILATERAL SALPINGO OOPHERECTOMY N/A 02/19/2017   Procedure: XI ROBOTIC ASSISTED TOTAL HYSTERECTOMY WITH BILATERAL SALPINGO OOPHORECTOMY;  Surgeon: Alphonso Aschoff, MD;  Location[facility];  Service: Gynecology;  Laterality: N/A;   SENTINEL NODE BIOPSY N/A 02/19/2017   Procedure: SENTINEL NODE BIOPSY;  Surgeon: Alphonso Aschoff, MD;  Location[facility];  Service: Gynecology;  Laterality: N/A;   SHOULDER ARTHROSCOPY WITH SUBACROMIAL DECOMPRESSION AND OPEN ROTATOR C Right 07/07/2013   Procedure: RIGHT SHOULDER  ARTHROSCOPY WITH SUBACROMIAL DECOMPRESSION AND ROTATOR CUFF DEBRIDEMENT/ DISTAL CLAVICLE RESECTION;  Surgeon: Amelie Baize., MD;  Location: [facility];  Service: Orthopedics;  Laterality: Right;   SHOULDER SURGERY Right 2012   done in [place]    MEDICATIONS:  apixaban  (ELIQUIS ) 5 MG TABS tablet   cephALEXin  (KEFLEX ) 500 MG capsule   citalopram  (CELEXA ) 10 MG tablet   glipiZIDE  (GLUCOTROL  XL) 2.5 MG 24 hr tablet   GLOBAL INJECT EASE INSULIN  SYR 31G X 5/16" 1 ML  MISC   hydrochlorothiazide (HYDRODIURIL) 25 MG tablet   insulin  isophane & regular human KwikPen (HUMULIN  70/30 MIX) (70-30) 100 UNIT/ML KwikPen   INSULIN  SYRINGE 1CC/29G 29G X 1/2" 1 ML MISC   levothyroxine  (SYNTHROID ) 112 MCG tablet   loratadine (CLARITIN) 10 MG tablet   Misc Natural Products (IMMUNE FORMULA PO)   Multiple Vitamins-Minerals (LUTEIN-ZEAXANTHIN PO)   Polyethyl Glycol-Propyl Glycol (SYSTANE) 0.4-0.3 % SOLN   Probiotic Product (PROBIOTIC DAILY PO)   No current facility-administered medications for this encounter.   Antoinette Kirschner [facility] Short Stay/Anesthesiology [facility] Phone [phone] 08/27/2023 1:47 PM

## 2023-08-28 DIAGNOSIS — R262 Difficulty in walking, not elsewhere classified: Secondary | ICD-10-CM | POA: Diagnosis not present

## 2023-08-28 DIAGNOSIS — M6281 Muscle weakness (generalized): Secondary | ICD-10-CM | POA: Diagnosis not present

## 2023-08-28 DIAGNOSIS — M25562 Pain in left knee: Secondary | ICD-10-CM | POA: Diagnosis not present

## 2023-08-28 DIAGNOSIS — M25561 Pain in right knee: Secondary | ICD-10-CM | POA: Diagnosis not present

## 2023-09-01 NOTE — Anesthesia Preprocedure Evaluation (Signed)
 Anesthesia Evaluation  Patient identified by MRN, date of birth, ID band Patient awake    Reviewed: Allergy & Precautions, H&P , NPO status , Patient's Chart, lab work & pertinent test results  Airway Mallampati: III  TM Distance: >3 FB Neck ROM: Full    Dental no notable dental hx. (+) Teeth Intact, Dental Advisory Given   Pulmonary sleep apnea    Pulmonary exam normal breath sounds clear to auscultation       Cardiovascular Exercise Tolerance: Good hypertension, Pt. on medications and Pt. on home beta blockers + dysrhythmias Atrial Fibrillation + pacemaker  Rhythm:Regular Rate:Normal     Neuro/Psych   Anxiety     negative neurological ROS     GI/Hepatic negative GI ROS, Neg liver ROS,,,  Endo/Other  diabetes, Type 2, Insulin  Dependent, Oral Hypoglycemic AgentsHypothyroidism    Renal/GU negative Renal ROS  negative genitourinary   Musculoskeletal  (+) Arthritis , Osteoarthritis,    Abdominal   Peds  Hematology  (+) Blood dyscrasia, anemia   Anesthesia Other Findings   Reproductive/Obstetrics negative OB ROS                             Anesthesia Physical Anesthesia Plan  ASA: 3  Anesthesia Plan: Spinal   Post-op Pain Management: Regional block* and Ofirmev  IV (intra-op)*   Induction: Intravenous  PONV Risk Score and Plan: 3 and Ondansetron , Dexamethasone  and Propofol  infusion  Airway Management Planned: Natural Airway and Simple Face Mask  Additional Equipment:   Intra-op Plan:   Post-operative Plan:   Informed Consent: I have reviewed the patients History and Physical, chart, labs and discussed the procedure including the risks, benefits and alternatives for the proposed anesthesia with the patient or authorized representative who has indicated his/her understanding and acceptance.     Dental advisory given  Plan Discussed with: CRNA  Anesthesia Plan Comments:         Anesthesia Quick Evaluation

## 2023-09-02 ENCOUNTER — Other Ambulatory Visit: Payer: Self-pay

## 2023-09-02 ENCOUNTER — Ambulatory Visit (HOSPITAL_COMMUNITY): Admitting: Anesthesiology

## 2023-09-02 ENCOUNTER — Encounter (HOSPITAL_COMMUNITY)
Admission: RE | Disposition: A | Discharge status: Home / Self Care | Payer: Self-pay | Source: Ambulatory Visit | Attending: Orthopedic Surgery

## 2023-09-02 ENCOUNTER — Observation Stay (HOSPITAL_COMMUNITY)
Admission: RE | Admit: 2023-09-02 | Discharge: 2023-09-03 | Disposition: A | Discharge status: Home / Self Care | Payer: Medicare Other | Source: Ambulatory Visit | Attending: Orthopedic Surgery | Admitting: Orthopedic Surgery

## 2023-09-02 ENCOUNTER — Ambulatory Visit (HOSPITAL_COMMUNITY): Admitting: Medical

## 2023-09-02 ENCOUNTER — Encounter (HOSPITAL_COMMUNITY): Payer: Self-pay | Admitting: Orthopedic Surgery

## 2023-09-02 DIAGNOSIS — Z01818 Encounter for other preprocedural examination: Principal | ICD-10-CM

## 2023-09-02 DIAGNOSIS — I10 Essential (primary) hypertension: Secondary | ICD-10-CM

## 2023-09-02 DIAGNOSIS — Z85828 Personal history of other malignant neoplasm of skin: Secondary | ICD-10-CM | POA: Diagnosis not present

## 2023-09-02 DIAGNOSIS — G8918 Other acute postprocedural pain: Secondary | ICD-10-CM | POA: Diagnosis not present

## 2023-09-02 DIAGNOSIS — Z794 Long term (current) use of insulin: Secondary | ICD-10-CM

## 2023-09-02 DIAGNOSIS — Z8542 Personal history of malignant neoplasm of other parts of uterus: Secondary | ICD-10-CM | POA: Diagnosis not present

## 2023-09-02 DIAGNOSIS — Z950 Presence of cardiac pacemaker: Secondary | ICD-10-CM | POA: Diagnosis not present

## 2023-09-02 DIAGNOSIS — M1711 Unilateral primary osteoarthritis, right knee: Secondary | ICD-10-CM

## 2023-09-02 DIAGNOSIS — E039 Hypothyroidism, unspecified: Secondary | ICD-10-CM

## 2023-09-02 DIAGNOSIS — E119 Type 2 diabetes mellitus without complications: Secondary | ICD-10-CM | POA: Insufficient documentation

## 2023-09-02 DIAGNOSIS — I4891 Unspecified atrial fibrillation: Secondary | ICD-10-CM | POA: Diagnosis not present

## 2023-09-02 DIAGNOSIS — M179 Osteoarthritis of knee, unspecified: Secondary | ICD-10-CM | POA: Diagnosis present

## 2023-09-02 HISTORY — PX: TOTAL KNEE ARTHROPLASTY: SHX125

## 2023-09-02 LAB — GLUCOSE, CAPILLARY
Glucose-Capillary: 95 mg/dL (ref 70–99)
Glucose-Capillary: 117 mg/dL — ABNORMAL HIGH (ref 70–99)
Glucose-Capillary: 110 mg/dL — ABNORMAL HIGH (ref 70–99)
Glucose-Capillary: 330 mg/dL — ABNORMAL HIGH (ref 70–99)
Glucose-Capillary: 357 mg/dL — ABNORMAL HIGH (ref 70–99)

## 2023-09-02 SURGERY — ARTHROPLASTY, KNEE, TOTAL
Anesthesia: Spinal | Site: Knee | Laterality: Right

## 2023-09-02 MED ORDER — PHENOL 1.4 % MT LIQD: 1.0000 | OROMUCOSAL | Status: DC | PRN

## 2023-09-02 MED ORDER — GLIPIZIDE ER 2.5 MG PO TB24
2.5000 mg | ORAL_TABLET | Freq: Every day | ORAL | Status: DC
  Administered 2023-09-03: 2.5 mg via ORAL
  Filled 2023-09-02: qty 1

## 2023-09-02 MED ORDER — STERILE WATER FOR IRRIGATION IR SOLN
Status: DC | PRN
  Administered 2023-09-02: 1000 mL

## 2023-09-02 MED ORDER — METHOCARBAMOL 1000 MG/10ML IJ SOLN: 500.0000 mg | Freq: Four times a day (QID) | INTRAMUSCULAR | Status: DC | PRN

## 2023-09-02 MED ORDER — PROPOFOL 10 MG/ML IV BOLUS
INTRAVENOUS | Status: DC | PRN
  Administered 2023-09-02: 20 mg via INTRAVENOUS
  Administered 2023-09-02: 30 mg via INTRAVENOUS

## 2023-09-02 MED ORDER — METOCLOPRAMIDE HCL 5 MG PO TABS: 5.0000 mg | ORAL_TABLET | Freq: Three times a day (TID) | ORAL | Status: DC | PRN

## 2023-09-02 MED ORDER — METOCLOPRAMIDE HCL 5 MG/ML IJ SOLN: 5.0000 mg | Freq: Three times a day (TID) | INTRAMUSCULAR | Status: DC | PRN

## 2023-09-02 MED ORDER — TRANEXAMIC ACID-NACL 1000-0.7 MG/100ML-% IV SOLN
1000.0000 mg | INTRAVENOUS | Status: AC
  Administered 2023-09-02: 1000 mg via INTRAVENOUS
  Filled 2023-09-02: qty 100

## 2023-09-02 MED ORDER — CEFAZOLIN SODIUM-DEXTROSE 2-4 GM/100ML-% IV SOLN
2.0000 g | INTRAVENOUS | Status: AC
  Administered 2023-09-02: 2 g via INTRAVENOUS
  Filled 2023-09-02: qty 100

## 2023-09-02 MED ORDER — HYDROCHLOROTHIAZIDE 25 MG PO TABS
25.0000 mg | ORAL_TABLET | Freq: Every morning | ORAL | Status: DC
  Administered 2023-09-03: 25 mg via ORAL
  Filled 2023-09-02: qty 1

## 2023-09-02 MED ORDER — BUPIVACAINE-EPINEPHRINE (PF) 0.5% -1:200000 IJ SOLN
INTRAMUSCULAR | Status: DC | PRN
  Administered 2023-09-02: 20 mL via PERINEURAL

## 2023-09-02 MED ORDER — HYDROMORPHONE HCL 1 MG/ML IJ SOLN: 0.5000 mg | INTRAMUSCULAR | Status: DC | PRN

## 2023-09-02 MED ORDER — FENTANYL CITRATE (PF) 100 MCG/2ML IJ SOLN
INTRAMUSCULAR | Status: AC
  Filled 2023-09-02: qty 2

## 2023-09-02 MED ORDER — DOCUSATE SODIUM 100 MG PO CAPS
100.0000 mg | ORAL_CAPSULE | Freq: Two times a day (BID) | ORAL | Status: DC
Start: 2023-09-02 — End: 2023-09-03
  Administered 2023-09-02 – 2023-09-03 (×3): 100 mg via ORAL
  Filled 2023-09-02 (×3): qty 1

## 2023-09-02 MED ORDER — POLYETHYLENE GLYCOL 3350 17 G PO PACK: 17.0000 g | PACK | Freq: Every day | ORAL | Status: DC | PRN

## 2023-09-02 MED ORDER — OXYCODONE HCL 5 MG PO TABS
5.0000 mg | ORAL_TABLET | ORAL | Status: DC | PRN
  Administered 2023-09-03: 5 mg via ORAL
  Filled 2023-09-02: qty 1

## 2023-09-02 MED ORDER — BISACODYL 10 MG RE SUPP: 10.0000 mg | Freq: Every day | RECTAL | Status: DC | PRN

## 2023-09-02 MED ORDER — DEXAMETHASONE SODIUM PHOSPHATE 10 MG/ML IJ SOLN
10.0000 mg | Freq: Once | INTRAMUSCULAR | Status: AC
  Administered 2023-09-03: 10 mg via INTRAVENOUS
  Filled 2023-09-02: qty 1

## 2023-09-02 MED ORDER — CEFAZOLIN SODIUM-DEXTROSE 2-4 GM/100ML-% IV SOLN
2.0000 g | Freq: Four times a day (QID) | INTRAVENOUS | Status: AC
  Administered 2023-09-02 (×2): 2 g via INTRAVENOUS
  Filled 2023-09-02 (×2): qty 100

## 2023-09-02 MED ORDER — SODIUM CHLORIDE 0.9 % IR SOLN
Status: DC | PRN
  Administered 2023-09-02: 1000 mL

## 2023-09-02 MED ORDER — POVIDONE-IODINE 10 % EX SWAB: 2.0000 | Freq: Once | CUTANEOUS | Status: DC

## 2023-09-02 MED ORDER — METHOCARBAMOL 500 MG PO TABS
500.0000 mg | ORAL_TABLET | Freq: Four times a day (QID) | ORAL | Status: DC | PRN
  Administered 2023-09-03 (×2): 500 mg via ORAL
  Filled 2023-09-02 (×2): qty 1

## 2023-09-02 MED ORDER — INSULIN ASPART 100 UNIT/ML IJ SOLN
0.0000 [IU] | Freq: Every day | INTRAMUSCULAR | Status: DC
Start: 2023-09-02 — End: 2023-09-03
  Administered 2023-09-02: 5 [IU] via SUBCUTANEOUS

## 2023-09-02 MED ORDER — CHLORHEXIDINE GLUCONATE 0.12 % MT SOLN
15.0000 mL | Freq: Once | OROMUCOSAL | Status: AC
  Administered 2023-09-02: 15 mL via OROMUCOSAL

## 2023-09-02 MED ORDER — SODIUM CHLORIDE (PF) 0.9 % IJ SOLN
INTRAMUSCULAR | Status: AC
  Filled 2023-09-02: qty 50

## 2023-09-02 MED ORDER — ACETAMINOPHEN 500 MG PO TABS
1000.0000 mg | ORAL_TABLET | Freq: Four times a day (QID) | ORAL | Status: AC
  Administered 2023-09-02 – 2023-09-03 (×4): 1000 mg via ORAL
  Filled 2023-09-02 (×4): qty 2

## 2023-09-02 MED ORDER — PROPOFOL 1000 MG/100ML IV EMUL
INTRAVENOUS | Status: AC
  Filled 2023-09-02: qty 100

## 2023-09-02 MED ORDER — SODIUM CHLORIDE (PF) 0.9 % IJ SOLN
INTRAMUSCULAR | Status: AC
  Filled 2023-09-02: qty 10

## 2023-09-02 MED ORDER — OXYCODONE HCL 5 MG PO TABS: 10.0000 mg | ORAL_TABLET | ORAL | Status: DC | PRN

## 2023-09-02 MED ORDER — PROPOFOL 500 MG/50ML IV EMUL
INTRAVENOUS | Status: DC | PRN
  Administered 2023-09-02: 120 ug/kg/min via INTRAVENOUS

## 2023-09-02 MED ORDER — PROPOFOL 500 MG/50ML IV EMUL
INTRAVENOUS | Status: AC
  Filled 2023-09-02: qty 50

## 2023-09-02 MED ORDER — DIPHENHYDRAMINE HCL 12.5 MG/5ML PO ELIX: 12.5000 mg | ORAL_SOLUTION | ORAL | Status: DC | PRN

## 2023-09-02 MED ORDER — SODIUM CHLORIDE 0.9 % IV SOLN: INTRAVENOUS | Status: DC

## 2023-09-02 MED ORDER — INSULIN ASPART 100 UNIT/ML IJ SOLN
0.0000 [IU] | Freq: Three times a day (TID) | INTRAMUSCULAR | Status: DC
  Administered 2023-09-02: 11 [IU] via SUBCUTANEOUS
  Administered 2023-09-03 (×2): 5 [IU] via SUBCUTANEOUS

## 2023-09-02 MED ORDER — ONDANSETRON HCL 4 MG/2ML IJ SOLN
4.0000 mg | Freq: Four times a day (QID) | INTRAMUSCULAR | Status: DC | PRN
  Administered 2023-09-03: 4 mg via INTRAVENOUS
  Filled 2023-09-02: qty 2

## 2023-09-02 MED ORDER — PHENYLEPHRINE HCL-NACL 20-0.9 MG/250ML-% IV SOLN
INTRAVENOUS | Status: DC | PRN
  Administered 2023-09-02: 50 ug/min via INTRAVENOUS

## 2023-09-02 MED ORDER — DEXAMETHASONE SODIUM PHOSPHATE 10 MG/ML IJ SOLN
INTRAMUSCULAR | Status: AC
  Filled 2023-09-02: qty 1

## 2023-09-02 MED ORDER — FENTANYL CITRATE (PF) 100 MCG/2ML IJ SOLN
INTRAMUSCULAR | Status: DC | PRN
  Administered 2023-09-02 (×2): 50 ug via INTRAVENOUS

## 2023-09-02 MED ORDER — LACTATED RINGERS IV SOLN: INTRAVENOUS | Status: DC

## 2023-09-02 MED ORDER — LACTATED RINGERS IV SOLN: INTRAVENOUS | Status: DC | PRN

## 2023-09-02 MED ORDER — LEVOTHYROXINE SODIUM 112 MCG PO TABS
112.0000 ug | ORAL_TABLET | Freq: Every day | ORAL | Status: DC
  Administered 2023-09-03: 112 ug via ORAL
  Filled 2023-09-02: qty 1

## 2023-09-02 MED ORDER — ONDANSETRON HCL 4 MG/2ML IJ SOLN
INTRAMUSCULAR | Status: AC
  Filled 2023-09-02: qty 2

## 2023-09-02 MED ORDER — APIXABAN 2.5 MG PO TABS
2.5000 mg | ORAL_TABLET | Freq: Two times a day (BID) | ORAL | Status: DC
  Administered 2023-09-03: 2.5 mg via ORAL
  Filled 2023-09-02: qty 1

## 2023-09-02 MED ORDER — BUPIVACAINE LIPOSOME 1.3 % IJ SUSP
INTRAMUSCULAR | Status: AC
  Filled 2023-09-02: qty 20

## 2023-09-02 MED ORDER — BUPIVACAINE LIPOSOME 1.3 % IJ SUSP
INTRAMUSCULAR | Status: DC | PRN
  Administered 2023-09-02: 20 mL

## 2023-09-02 MED ORDER — ORAL CARE MOUTH RINSE: 15.0000 mL | Freq: Once | OROMUCOSAL | Status: AC

## 2023-09-02 MED ORDER — ONDANSETRON HCL 4 MG PO TABS: 4.0000 mg | ORAL_TABLET | Freq: Four times a day (QID) | ORAL | Status: DC | PRN

## 2023-09-02 MED ORDER — SODIUM CHLORIDE (PF) 0.9 % IJ SOLN
INTRAMUSCULAR | Status: DC | PRN
  Administered 2023-09-02: 60 mL

## 2023-09-02 MED ORDER — INSULIN ASPART 100 UNIT/ML IJ SOLN: 0.0000 [IU] | INTRAMUSCULAR | Status: DC | PRN

## 2023-09-02 MED ORDER — ACETAMINOPHEN 325 MG PO TABS
325.0000 mg | ORAL_TABLET | Freq: Four times a day (QID) | ORAL | Status: DC | PRN
  Filled 2023-09-02: qty 2

## 2023-09-02 MED ORDER — ONDANSETRON HCL 4 MG/2ML IJ SOLN
INTRAMUSCULAR | Status: DC | PRN
  Administered 2023-09-02: 4 mg via INTRAVENOUS

## 2023-09-02 MED ORDER — DEXAMETHASONE SODIUM PHOSPHATE 10 MG/ML IJ SOLN
8.0000 mg | Freq: Once | INTRAMUSCULAR | Status: AC
  Administered 2023-09-02: 4 mg via INTRAVENOUS

## 2023-09-02 MED ORDER — CITALOPRAM HYDROBROMIDE 20 MG PO TABS
10.0000 mg | ORAL_TABLET | ORAL | Status: DC
  Administered 2023-09-02: 10 mg via ORAL
  Filled 2023-09-02: qty 1

## 2023-09-02 MED ORDER — FENTANYL CITRATE PF 50 MCG/ML IJ SOSY: 25.0000 ug | PREFILLED_SYRINGE | INTRAMUSCULAR | Status: DC | PRN

## 2023-09-02 MED ORDER — ACETAMINOPHEN 10 MG/ML IV SOLN
1000.0000 mg | Freq: Four times a day (QID) | INTRAVENOUS | Status: DC
  Administered 2023-09-02: 1000 mg via INTRAVENOUS
  Filled 2023-09-02: qty 100

## 2023-09-02 MED ORDER — 0.9 % SODIUM CHLORIDE (POUR BTL) OPTIME
TOPICAL | Status: DC | PRN
  Administered 2023-09-02: 1000 mL

## 2023-09-02 MED ORDER — FLEET ENEMA RE ENEM: 1.0000 | ENEMA | Freq: Once | RECTAL | Status: DC | PRN

## 2023-09-02 MED ORDER — MENTHOL 3 MG MT LOZG: 1.0000 | LOZENGE | OROMUCOSAL | Status: DC | PRN

## 2023-09-02 MED ORDER — BUPIVACAINE LIPOSOME 1.3 % IJ SUSP: 20.0000 mL | Freq: Once | INTRAMUSCULAR | Status: DC

## 2023-09-02 SURGICAL SUPPLY — 47 items
ATTUNE PS FEM RT SZ 5 CEM KNEE (Femur) IMPLANT
ATTUNE PSRP INSR SZ5 8 KNEE (Insert) IMPLANT
BAG COUNTER SPONGE SURGICOUNT (BAG) IMPLANT
BAG ZIPLOCK 12X15 (MISCELLANEOUS) ×1 IMPLANT
BASE TIBIAL ROT PLAT SZ 5 KNEE (Knees) IMPLANT
BLADE SAG 18X100X1.27 (BLADE) ×1 IMPLANT
BLADE SAW SGTL 11.0X1.19X90.0M (BLADE) ×1 IMPLANT
BNDG ELASTIC 6INX 5YD STR LF (GAUZE/BANDAGES/DRESSINGS) ×1 IMPLANT
BOWL SMART MIX CTS (DISPOSABLE) ×1 IMPLANT
CEMENT HV SMART SET (Cement) ×2 IMPLANT
COVER SURGICAL LIGHT HANDLE (MISCELLANEOUS) ×1 IMPLANT
CUFF TRNQT CYL 34X4.125X (TOURNIQUET CUFF) ×1 IMPLANT
DERMABOND ADVANCED .7 DNX12 (GAUZE/BANDAGES/DRESSINGS) ×1 IMPLANT
DRAPE U-SHAPE 47X51 STRL (DRAPES) ×1 IMPLANT
DRSG AQUACEL AG ADV 3.5X10 (GAUZE/BANDAGES/DRESSINGS) ×1 IMPLANT
DURAPREP 26ML APPLICATOR (WOUND CARE) ×1 IMPLANT
ELECT PENCIL ROCKER SW 15FT (MISCELLANEOUS) ×1 IMPLANT
ELECT REM PT RETURN 15FT ADLT (MISCELLANEOUS) ×1 IMPLANT
GAUZE PAD ABD 8X10 STRL (GAUZE/BANDAGES/DRESSINGS) IMPLANT
GAUZE SPONGE 4X4 12PLY STRL (GAUZE/BANDAGES/DRESSINGS) IMPLANT
GLOVE BIO SURGEON STRL SZ 6.5 (GLOVE) IMPLANT
GLOVE BIO SURGEON STRL SZ7 (GLOVE) IMPLANT
GLOVE BIO SURGEON STRL SZ8 (GLOVE) ×1 IMPLANT
GLOVE BIOGEL PI IND STRL 7.0 (GLOVE) ×1 IMPLANT
GLOVE BIOGEL PI IND STRL 8 (GLOVE) ×1 IMPLANT
GOWN STRL REUS W/ TWL LRG LVL3 (GOWN DISPOSABLE) ×1 IMPLANT
HOLDER FOLEY CATH W/STRAP (MISCELLANEOUS) ×1 IMPLANT
IMMOBILIZER KNEE 20 (SOFTGOODS) ×1 IMPLANT
IMMOBILIZER KNEE 20 THIGH 36 (SOFTGOODS) ×1 IMPLANT
KIT TURNOVER KIT A (KITS) ×1 IMPLANT
MANIFOLD NEPTUNE II (INSTRUMENTS) ×1 IMPLANT
NS IRRIG 1000ML POUR BTL (IV SOLUTION) ×1 IMPLANT
PACK TOTAL KNEE CUSTOM (KITS) ×1 IMPLANT
PADDING CAST COTTON 6X4 STRL (CAST SUPPLIES) ×2 IMPLANT
PATELLA MEDIAL ATTUN 35MM KNEE (Knees) IMPLANT
PIN STEINMAN FIXATION KNEE (PIN) IMPLANT
PROTECTOR NERVE ULNAR (MISCELLANEOUS) ×1 IMPLANT
SET HNDPC FAN SPRY TIP SCT (DISPOSABLE) ×1 IMPLANT
SUT MNCRL AB 4-0 PS2 18 (SUTURE) ×1 IMPLANT
SUT VIC AB 2-0 CT1 TAPERPNT 27 (SUTURE) ×3 IMPLANT
SUTURE STRATFX 0 PDS 27 VIOLET (SUTURE) ×1 IMPLANT
TOWEL GREEN STERILE FF (TOWEL DISPOSABLE) ×1 IMPLANT
TRAY FOL W/BAG SLVR 16FR STRL (SET/KITS/TRAYS/PACK) IMPLANT
TRAY FOLEY MTR SLVR 16FR STAT (SET/KITS/TRAYS/PACK) ×1 IMPLANT
TUBE SUCTION HIGH CAP CLEAR NV (SUCTIONS) ×1 IMPLANT
WATER STERILE IRR 1000ML POUR (IV SOLUTION) ×2 IMPLANT
WRAP KNEE MAXI GEL POST OP (GAUZE/BANDAGES/DRESSINGS) ×1 IMPLANT

## 2023-09-02 NOTE — Op Note (Signed)
 OPERATIVE REPORT-TOTAL KNEE ARTHROPLASTY   Pre-operative diagnosis- Osteoarthritis  Right knee(s)  Post-operative diagnosis- Osteoarthritis Right knee(s)  Procedure-  Right  Total Knee Arthroplasty  Surgeon- Henri Loft. Zaydin Billey, MD  Assistant- Angelo Kennedy, PA-C   Anesthesia-   Adductor canal block and spinal  EBL- 25 ml   Drains None  Tourniquet time-  Total Tourniquet Time Documented: Thigh (Right) - 42 minutes Total: Thigh (Right) - 42 minutes     Complications- None  Condition-PACU - hemodynamically stable.   Brief Clinical Note  Alicia [name] is a 78 y.o. year old female with end stage OA of her right knee with progressively worsening pain and dysfunction. She has constant pain, with activity and at rest and significant functional deficits with difficulties even with ADLs. She has had extensive non-op management including analgesics, injections of cortisone and viscosupplements, and home exercise program, but remains in significant pain with significant dysfunction.Radiographs show bone on bone arthritis medial and patellofemoral. She presents now for right Total Knee Arthroplasty.     Procedure in detail---   The patient is brought into the operating room and positioned supine on the operating table. After successful administration of  Adductor canal block and spinal,   a tourniquet is placed high on the  Right thigh(s) and the lower extremity is prepped and draped in the usual sterile fashion. Time out is performed by the operating team and then the  Right lower extremity is wrapped in Esmarch, knee flexed and the tourniquet inflated to 300 mmHg.       A midline incision is made with a ten blade through the subcutaneous tissue to the level of the extensor mechanism. A fresh blade is used to make a medial parapatellar arthrotomy. Soft tissue over the proximal medial tibia is subperiosteally elevated to the joint line with a knife and into the semimembranosus bursa with a  Cobb elevator. Soft tissue over the proximal lateral tibia is elevated with attention being paid to avoiding the patellar tendon on the tibial tubercle. The patella is everted, knee flexed 90 degrees and the ACL and PCL are removed. Findings are bone on bone medial and patellofemoral with large global osteophytes        The drill is used to create a starting hole in the distal femur and the canal is thoroughly irrigated with sterile saline to remove the fatty contents. The 5 degree Right  valgus alignment guide is placed into the femoral canal and the distal femoral cutting block is pinned to remove 10 mm off the distal femur. Resection is made with an oscillating saw.      The tibia is subluxed forward and the menisci are removed. The extramedullary alignment guide is placed referencing proximally at the medial aspect of the tibial tubercle and distally along the second metatarsal axis and tibial crest. The block is pinned to remove 2mm off the more deficient medial  side. Resection is made with an oscillating saw. Size 5is the most appropriate size for the tibia and the proximal tibia is prepared with the modular drill and keel punch for that size.      The femoral sizing guide is placed and size 5 is most appropriate. Rotation is marked off the epicondylar axis and confirmed by creating a rectangular flexion gap at 90 degrees. The size 5 cutting block is pinned in this rotation and the anterior, posterior and chamfer cuts are made with the oscillating saw. The intercondylar block is then placed and that cut is  made.      Trial size 5 tibial component, trial size 5 posterior stabilized femur and a 8  mm posterior stabilized rotating platform insert trial is placed. Full extension is achieved with excellent varus/valgus and anterior/posterior balance throughout full range of motion. The patella is everted and thickness measured to be 22  mm. Free hand resection is taken to 12 mm, a 35 template is placed, lug  holes are drilled, trial patella is placed, and it tracks normally. Osteophytes are removed off the posterior femur with the trial in place. All trials are removed and the cut bone surfaces prepared with pulsatile lavage. Cement is mixed and once ready for implantation, the size 5 tibial implant, size  5 posterior stabilized femoral component, and the size 35 patella are cemented in place and the patella is held with the clamp. The trial insert is placed and the knee held in full extension. The Exparel (20 ml mixed with 60 ml saline) is injected into the extensor mechanism, posterior capsule, medial and lateral gutters and subcutaneous tissues.  All extruded cement is removed and once the cement is hard the permanent 8 mm posterior stabilized rotating platform insert is placed into the tibial tray.      The wound is copiously irrigated with saline solution and the extensor mechanism closed with # 0 Stratofix suture. The tourniquet is released for a total tourniquet time of 42  minutes. Flexion against gravity is 140 degrees and the patella tracks normally. Subcutaneous tissue is closed with 2.0 vicryl and subcuticular with running 4.0 Monocryl. The incision is cleaned and dried and steri-strips and a bulky sterile dressing are applied. The limb is placed into a knee immobilizer and the patient is awakened and transported to recovery in stable condition.      Please note that a surgical assistant was a medical necessity for this procedure in order to perform it in a safe and expeditious manner. Surgical assistant was necessary to retract the ligaments and vital neurovascular structures to prevent injury to them and also necessary for proper positioning of the limb to allow for anatomic placement of the prosthesis.   Henri Loft Camani Sesay, MD    09/02/2023, 8:27 AM

## 2023-09-02 NOTE — Progress Notes (Signed)
 Orthopedic Tech Progress Note Patient Details:  Alicia [name] [facility] 1946/02/04 [id number]  CPM Right Knee CPM Right Knee: Off Right Knee Flexion (Degrees): 40 Right Knee Extension (Degrees): 10  Post Interventions Patient Tolerated: Well  CPM removed at 14:52  Kemyra August OTR/L 09/02/2023, 4:26 PM

## 2023-09-02 NOTE — Care Plan (Signed)
 Ortho Bundle Case Management Note  Patient Details  Name: EZRI FASULO MRN: [id number] Date of Birth: 1946/02/10  R TKA on 09/02/23.   DCP: Home with daughter.  DME: RW ordered through [employer]. Has cane.  PT: [facility]. Referral faxed.                 DME Arranged:  Otho Blitz rolling DME Agency:  [employer]  HH Arranged:    HH Agency:     Additional Comments: Please contact me with any questions of if this plan should need to change.  Bronwen Canon, [place] [facility] [phone]    09/02/2023, 9:33 AM

## 2023-09-02 NOTE — Anesthesia Procedure Notes (Signed)
 Anesthesia Regional Block: Adductor canal block   Pre-Anesthetic Checklist: , timeout performed,  Correct Patient, Correct Site, Correct Laterality,  Correct Procedure, Correct Position, site marked,  Risks and benefits discussed,  Pre-op evaluation,  At surgeon's request and post-op pain management  Laterality: Right  Prep: Maximum Sterile Barrier Precautions used, chloraprep       Needles:  Injection technique: Single-shot  Needle Type: Echogenic Stimulator Needle     Needle Length: 9cm  Needle Gauge: 21     Additional Needles:   Procedures:,,,, ultrasound used (permanent image in chart),,    Narrative:  Start time: 09/02/2023 6:43 AM End time: 09/02/2023 6:53 AM Injection made incrementally with aspirations every 5 mL.  Performed by: Personally  Anesthesiologist: Jake Mayers, MD

## 2023-09-02 NOTE — Discharge Instructions (Addendum)
 Alicia Rei, MD Total Joint Specialist [facility] [address] [place], [place] [address] [phone]  TOTAL KNEE REPLACEMENT POSTOPERATIVE DIRECTIONS    Knee Rehabilitation, Guidelines Following Surgery  Results after knee surgery are often greatly improved when you follow the exercise, range of motion and muscle strengthening exercises prescribed by your doctor. Safety measures are also important to protect the knee from further injury. If any of these exercises cause you to have increased pain or swelling in your knee joint, decrease the amount until you are comfortable again and slowly increase them. If you have problems or questions, call your caregiver or physical therapist for advice.   HOME CARE INSTRUCTIONS  Remove items at home which could result in a fall. This includes throw rugs or furniture in walking pathways.  ICE to the affected knee as much as tolerated. Icing helps control swelling. If the swelling is well controlled you will be more comfortable and rehab easier. Continue to use ice on the knee for pain and swelling from surgery. You may notice swelling that will progress down to the foot and ankle. This is normal after surgery. Elevate the leg when you are not up walking on it.    Continue to use the breathing machine which will help keep your temperature down. It is common for your temperature to cycle up and down following surgery, especially at night when you are not up moving around and exerting yourself. The breathing machine keeps your lungs expanded and your temperature down. Do not place pillow under the operative knee, focus on keeping the knee straight while resting  DIET You may resume your previous home diet once you are discharged from the hospital.  DRESSING / WOUND CARE / SHOWERING Keep your bulky bandage on for 2 days. On the third post-operative day you may remove the dressing. Place a new, clean dressing of gauze with  paper tape over your incision. Change this dressing daily. You may begin showering 3 days following surgery, but do not submerge the incision under water .  ACTIVITY For the first 5 days, the key is rest and control of pain and swelling Do your home exercises twice a day starting on post-operative day 3. On the days you go to physical therapy, just do the home exercises once that day. You should rest, ice and elevate the leg for 50 minutes out of every hour. Get up and walk/stretch for 10 minutes per hour. After 5 days you can increase your activity slowly as tolerated. Walk with your walker as instructed. Use the walker until you are comfortable transitioning to a cane. Walk with the cane in the opposite hand of the operative leg. You may discontinue the cane once you are comfortable and walking steadily. Avoid periods of inactivity such as sitting longer than an hour when not asleep. This helps prevent blood clots.  You may discontinue the knee immobilizer once you are able to perform a straight leg raise while lying down. You may resume a sexual relationship in one month or when given the OK by your doctor.  You may return to work once you are cleared by your doctor.  Do not drive a car for 6 weeks or until released by your surgeon.  Do not drive while taking narcotics.  TED HOSE STOCKINGS Wear the elastic stockings on both legs for three weeks following surgery during the day. You may remove them at night for sleeping.  WEIGHT BEARING Weight bearing as tolerated with  assist device (walker, cane, etc) as directed, use it as long as suggested by your surgeon or therapist, typically at least 4-6 weeks.  POSTOPERATIVE CONSTIPATION PROTOCOL Constipation - defined medically as fewer than three stools per week and severe constipation as less than one stool per week.  One of the most common issues patients have following surgery is constipation.  Even if you have a regular bowel pattern at home,  your normal regimen is likely to be disrupted due to multiple reasons following surgery.  Combination of anesthesia, postoperative narcotics, change in appetite and fluid intake all can affect your bowels.  In order to avoid complications following surgery, here are some recommendations in order to help you during your recovery period.  Colace (docusate) - Pick up an over-the-counter form of Colace or another stool softener and take twice a day as long as you are requiring postoperative pain medications.  Take with a full glass of water  daily.  If you experience loose stools or diarrhea, hold the colace until you stool forms back up. If your symptoms do not get better within 1 week or if they get worse, check with your doctor. Dulcolax (bisacodyl) - Pick up over-the-counter and take as directed by the product packaging as needed to assist with the movement of your bowels.  Take with a full glass of water .  Use this product as needed if not relieved by Colace only.  MiraLax (polyethylene glycol) - Pick up over-the-counter to have on hand. MiraLax is a solution that will increase the amount of water  in your bowels to assist with bowel movements.  Take as directed and can mix with a glass of water , juice, soda, coffee, or tea. Take if you go more than two days without a movement. Do not use MiraLax more than once per day. Call your doctor if you are still constipated or irregular after using this medication for 7 days in a row.  If you continue to have problems with postoperative constipation, please contact the office for further assistance and recommendations.  If you experience "the worst abdominal pain ever" or develop nausea or vomiting, please contact the office immediatly for further recommendations for treatment.  ITCHING If you experience itching with your medications, try taking only a single pain pill, or even half a pain pill at a time.  You can also use Benadryl  over the counter for itching or  also to help with sleep.   MEDICATIONS See your medication summary on the "After Visit Summary" that the nursing staff will review with you prior to discharge.  You may have some home medications which will be placed on hold until you complete the course of blood thinner medication.  It is important for you to complete the blood thinner medication as prescribed by your surgeon.  Continue your approved medications as instructed at time of discharge.  PRECAUTIONS If you experience chest pain or shortness of breath - call 911 immediately for transfer to the hospital emergency department.  If you develop a fever greater that 101 F, purulent drainage from wound, increased redness or drainage from wound, foul odor from the wound/dressing, or calf pain - CONTACT YOUR SURGEON.                                                   FOLLOW-UP APPOINTMENTS Make sure you keep  all of your appointments after your operation with your surgeon and caregivers. You should call the office at the above phone number and make an appointment for approximately two weeks after the date of your surgery or on the date instructed by your surgeon outlined in the "After Visit Summary".  RANGE OF MOTION AND STRENGTHENING EXERCISES  Rehabilitation of the knee is important following a knee injury or an operation. After just a few days of immobilization, the muscles of the thigh which control the knee become weakened and shrink (atrophy). Knee exercises are designed to build up the tone and strength of the thigh muscles and to improve knee motion. Often times heat used for twenty to thirty minutes before working out will loosen up your tissues and help with improving the range of motion but do not use heat for the first two weeks following surgery. These exercises can be done on a training (exercise) mat, on the floor, on a table or on a bed. Use what ever works the best and is most comfortable for you Knee exercises include:  Leg Lifts -  While your knee is still immobilized in a splint or cast, you can do straight leg raises. Lift the leg to 60 degrees, hold for 3 sec, and slowly lower the leg. Repeat 10-20 times 2-3 times daily. Perform this exercise against resistance later as your knee gets better.  Quad and Hamstring Sets - Tighten up the muscle on the front of the thigh (Quad) and hold for 5-10 sec. Repeat this 10-20 times hourly. Hamstring sets are done by pushing the foot backward against an object and holding for 5-10 sec. Repeat as with quad sets.  Leg Slides: Lying on your back, slowly slide your foot toward your buttocks, bending your knee up off the floor (only go as far as is comfortable). Then slowly slide your foot back down until your leg is flat on the floor again. Angel Wings: Lying on your back spread your legs to the side as far apart as you can without causing discomfort.  A rehabilitation program following serious knee injuries can speed recovery and prevent re-injury in the future due to weakened muscles. Contact your doctor or a physical therapist for more information on knee rehabilitation.   POST-OPERATIVE OPIOID TAPER INSTRUCTIONS: It is important to wean off of your opioid medication as soon as possible. If you do not need pain medication after your surgery it is ok to stop day one. Opioids include: Codeine, Hydrocodone(Norco, Vicodin), Oxycodone (Percocet, oxycontin ) and hydromorphone  amongst others.  Long term and even short term use of opiods can cause: Increased pain response Dependence Constipation Depression Respiratory depression And more.  Withdrawal symptoms can include Flu like symptoms Nausea, vomiting And more Techniques to manage these symptoms Hydrate well Eat regular healthy meals Stay active Use relaxation techniques(deep breathing, meditating, yoga) Do Not substitute Alcohol  to help with tapering If you have been on opioids for less than two weeks and do not have pain than it is  ok to stop all together.  Plan to wean off of opioids This plan should start within one week post op of your joint replacement. Maintain the same interval or time between taking each dose and first decrease the dose.  Cut the total daily intake of opioids by one tablet each day Next start to increase the time between doses. The last dose that should be eliminated is the evening dose.   IF YOU ARE TRANSFERRED TO A SKILLED REHAB FACILITY If the  patient is transferred to a skilled rehab facility following release from the hospital, a list of the current medications will be sent to the facility for the patient to continue.  When discharged from the skilled rehab facility, please have the facility set up the patient's Home Health Physical Therapy prior to being released. Also, the skilled facility will be responsible for providing the patient with their medications at time of release from the facility to include their pain medication, the muscle relaxants, and their blood thinner medication. If the patient is still at the rehab facility at time of the two week follow up appointment, the skilled rehab facility will also need to assist the patient in arranging follow up appointment in our office and any transportation needs.  MAKE SURE YOU:  Understand these instructions.  Get help right away if you are not doing well or get worse.   DENTAL ANTIBIOTICS:  In most cases prophylactic antibiotics for Dental procdeures after total joint surgery are not necessary.  Exceptions are as follows:  1. History of prior total joint infection  2. Severely immunocompromised (Organ Transplant, cancer chemotherapy, Rheumatoid biologic meds such as Humera)  3. Poorly controlled diabetes (A1C &gt; 8.0, blood glucose over 200)  If you have one of these conditions, contact your surgeon for an antibiotic prescription, prior to your dental procedure.    Pick up stool softner and laxative for home use following  surgery while on pain medications. Do not submerge incision under water . Please use good hand washing techniques while changing dressing each day. May shower starting three days after surgery. Please use a clean towel to pat the incision dry following showers. Continue to use ice for pain and swelling after surgery. Do not use any lotions or creams on the incision until instructed by your surgeon.

## 2023-09-02 NOTE — Progress Notes (Addendum)
 Orthopedic Tech Progress Note Patient Details:  Vertina Meas [facility] 09-14-1945 [id number]  CPM Right Knee CPM Right Knee: On Right Knee Flexion (Degrees): 40 Right Knee Extension (Degrees): 10  Post Interventions Patient Tolerated: Well  Pt placed in CPM at 10:25 am  Binh Doten OTR/L 09/02/2023, 10:42 AM

## 2023-09-02 NOTE — Anesthesia Postprocedure Evaluation (Signed)
 Anesthesia Post Note  Patient: Alicia [name]  Procedure(s) Performed: ARTHROPLASTY, KNEE, TOTAL (Right: Knee)     Patient location during evaluation: PACU Anesthesia Type: Spinal and Regional Level of consciousness: oriented and awake and alert Pain management: pain level controlled Vital Signs Assessment: post-procedure vital signs reviewed and stable Respiratory status: spontaneous breathing, respiratory function stable and patient connected to nasal cannula oxygen Cardiovascular status: blood pressure returned to baseline and stable Postop Assessment: no headache, no backache, no apparent nausea or vomiting, spinal receding and patient able to bend at knees Anesthetic complications: no  No notable events documented.  Last Vitals:  Vitals:   09/02/23 0930 09/02/23 0945  BP: (!) 134/47 (!) 127/50  Pulse: (!) 57 60  Resp: 14 20  Temp:  (!) 36.4 C  SpO2: 100% 98%    Last Pain:  Vitals:   09/02/23 0945  TempSrc:   PainSc: 0-No pain                 Jumanah Hynson,W. EDMOND

## 2023-09-02 NOTE — Evaluation (Signed)
 Physical Therapy Evaluation Patient Details Name: Alicia [name] MRN: [id number] DOB: July 23, 1945 Today's Date: 09/02/2023  History of Present Illness  78 yo female s/p R TKA 09/02/23  Clinical Impression  On eval POD 0, pt required Min A for mobility. She walked ~40 feet with a RW. Pt tolerated activity well. Plan is for possible d/c home on tomorrow with OP PT f/u. Will follow pt acutely.        If plan is discharge home, recommend the following: A little help with walking and/or transfers;A little help with bathing/dressing/bathroom;Assistance with cooking/housework;Assist for transportation;Help with stairs or ramp for entrance   Can travel by private vehicle        Equipment Recommendations Rolling walker (2 wheels)  Recommendations for Other Services       Functional Status Assessment Patient has had a recent decline in their functional status and demonstrates the ability to make significant improvements in function in a reasonable and predictable amount of time.     Precautions / Restrictions Precautions Precautions: Fall;Knee Restrictions Weight Bearing Restrictions Per Provider Order: No RLE Weight Bearing Per Provider Order: Weight bearing as tolerated      Mobility  Bed Mobility Overal bed mobility: Needs Assistance Bed Mobility: Supine to Sit     Supine to sit: Min assist, HOB elevated, Used rails     General bed mobility comments: Assist for R LE. Cues provided.    Transfers Overall transfer level: Needs assistance Equipment used: Rolling walker (2 wheels) Transfers: Sit to/from Stand Sit to Stand: Min assist           General transfer comment: Assist to rise, steady, position R LE, control descent. Cues for safety, technique, hand/LE placement.    Ambulation/Gait Ambulation/Gait assistance: Min assist Gait Distance (Feet): 40 Feet Assistive device: Rolling walker (2 wheels) Gait Pattern/deviations: Step-to pattern       General Gait Details:  Cues for safety, sequencing. Assist to steady throughout distance. Followed with recliner and transported pt back to bedside.  Stairs            Wheelchair Mobility     Tilt Bed    Modified Rankin (Stroke Patients Only)       Balance Overall balance assessment: Needs assistance         Standing balance support: Bilateral upper extremity supported, During functional activity, Reliant on assistive device for balance Standing balance-Leahy Scale: Poor                               Pertinent Vitals/Pain Pain Assessment Pain Assessment: Faces Faces Pain Scale: Hurts little more Pain Location: R knee Pain Descriptors / Indicators: Aching Pain Intervention(s): Limited activity within patient's tolerance, Monitored during session, Repositioned    Home Living Family/patient expects to be discharged to:: Private residence Living Arrangements: Children Available Help at Discharge: Family Type of Home: House Home Access: Stairs to enter Entrance Stairs-Rails: None Entrance Stairs-Number of Steps: 1   Home Layout: One level Home Equipment: Rollator (4 wheels);Cane - single point;Lift chair      Prior Function Prior Level of Function : Independent/Modified Independent             Mobility Comments: using cane vs RW       Extremity/Trunk Assessment   Upper Extremity Assessment Upper Extremity Assessment: Generalized weakness    Lower Extremity Assessment Lower Extremity Assessment: Generalized weakness    Cervical / Trunk Assessment Cervical /  Trunk Assessment: Normal  Communication   Communication Communication: No apparent difficulties    Cognition Arousal: Alert Behavior During Therapy: WFL for tasks assessed/performed   PT - Cognitive impairments: No apparent impairments                         Following commands: Intact       Cueing Cueing Techniques: Verbal cues     General Comments      Exercises Total Joint  Exercises Quad Sets: AROM, Right, 5 reps Straight Leg Raises: AAROM, Right, 5 reps   Assessment/Plan    PT Assessment Patient needs continued PT services  PT Problem List Decreased strength;Decreased range of motion;Decreased activity tolerance;Decreased balance;Decreased mobility;Decreased knowledge of use of DME;Pain;Decreased knowledge of precautions       PT Treatment Interventions DME instruction;Gait training;Stair training;Functional mobility training;Therapeutic activities;Therapeutic exercise;Patient/family education;Balance training    PT Goals (Current goals can be found in the Care Plan section)  Acute Rehab PT Goals Patient Stated Goal: regain PLOF PT Goal Formulation: With patient/family Time For Goal Achievement: 09/16/23 Potential to Achieve Goals: Good    Frequency 7X/week     Co-evaluation               AM-PAC PT "6 Clicks" Mobility  Outcome Measure Help needed turning from your back to your side while in a flat bed without using bedrails?: A Little Help needed moving from lying on your back to sitting on the side of a flat bed without using bedrails?: A Little Help needed moving to and from a bed to a chair (including a wheelchair)?: A Little Help needed standing up from a chair using your arms (e.g., wheelchair or bedside chair)?: A Little Help needed to walk in hospital room?: A Little Help needed climbing 3-5 steps with a railing? : A Lot 6 Click Score: 17    End of Session Equipment Utilized During Treatment: Gait belt;Right knee immobilizer Activity Tolerance: Patient tolerated treatment well Patient left: in chair;with call bell/phone within reach;with family/visitor present   PT Visit Diagnosis: Other abnormalities of gait and mobility (R26.89);Pain Pain - Right/Left: Right Pain - part of body: Knee    Time: 1450-1509 PT Time Calculation (min) (ACUTE ONLY): 19 min   Charges:   PT Evaluation $PT Eval Low Complexity: 1 Low   PT  General Charges $$ ACUTE PT VISIT: 1 Visit           Tanda Falter, PT Acute Rehabilitation  Office: [phone]

## 2023-09-02 NOTE — Interval H&P Note (Signed)
 History and Physical Interval Note:  09/02/2023 6:26 AM  Alicia [name]  has presented today for surgery, with the diagnosis of right knee osteoarthritis.  The various methods of treatment have been discussed with the patient and family. After consideration of risks, benefits and other options for treatment, the patient has consented to  Procedure(s): ARTHROPLASTY, KNEE, TOTAL (Right) as a surgical intervention.  The patient's history has been reviewed, patient examined, no change in status, stable for surgery.  I have reviewed the patient's chart and labs.  Questions were answered to the patient's satisfaction.     [name]

## 2023-09-02 NOTE — Anesthesia Procedure Notes (Signed)
 Spinal  Patient location during procedure: OR Start time: 09/02/2023 7:17 AM End time: 09/02/2023 7:22 AM Reason for block: surgical anesthesia Staffing Performed: anesthesiologist  Anesthesiologist: Jake Mayers, MD Performed by: Jake Mayers, MD Authorized by: Jake Mayers, MD   Preanesthetic Checklist Completed: patient identified, IV checked, risks and benefits discussed, surgical consent, monitors and equipment checked, pre-op evaluation and timeout performed Spinal Block Patient position: sitting Prep: DuraPrep Patient monitoring: cardiac monitor, continuous pulse ox and blood pressure Approach: midline Location: L3-4 Injection technique: single-shot Needle Needle type: Pencan  Needle gauge: 24 G Needle length: 9 cm Assessment Sensory level: T8 Events: CSF return Additional Notes Functioning IV was confirmed and monitors were applied. Sterile prep and drape, including hand hygiene and sterile gloves were used. The patient was positioned and the spine was prepped. The skin was anesthetized with lidocaine .  Free flow of clear CSF was obtained prior to injecting local anesthetic into the CSF.  The spinal needle aspirated freely following injection.  The needle was carefully withdrawn.  The patient tolerated the procedure well.

## 2023-09-02 NOTE — Transfer of Care (Signed)
 Immediate Anesthesia Transfer of Care Note  Patient: Alicia [name]  Procedure(s) Performed: ARTHROPLASTY, KNEE, TOTAL (Right: Knee)  Patient Location: PACU  Anesthesia Type:MAC and Spinal  Level of Consciousness: drowsy  Airway & Oxygen Therapy: Patient Spontanous Breathing and Patient connected to face mask oxygen  Post-op Assessment: Report given to RN and Post -op Vital signs reviewed and stable  Post vital signs: Reviewed and stable  Last Vitals:  Vitals Value Taken Time  BP 124/45 09/02/23 0855  Temp    Pulse 59 09/02/23 0857  Resp 14 09/02/23 0857  SpO2 100 % 09/02/23 0857  Vitals shown include unfiled device data.  Last Pain:  Vitals:   09/02/23 0616  TempSrc: Oral  PainSc:          Complications: No notable events documented.

## 2023-09-03 ENCOUNTER — Encounter (HOSPITAL_COMMUNITY): Payer: Self-pay | Admitting: Orthopedic Surgery

## 2023-09-03 DIAGNOSIS — E039 Hypothyroidism, unspecified: Secondary | ICD-10-CM | POA: Diagnosis not present

## 2023-09-03 DIAGNOSIS — I10 Essential (primary) hypertension: Secondary | ICD-10-CM | POA: Diagnosis not present

## 2023-09-03 DIAGNOSIS — E119 Type 2 diabetes mellitus without complications: Secondary | ICD-10-CM | POA: Diagnosis not present

## 2023-09-03 DIAGNOSIS — M1711 Unilateral primary osteoarthritis, right knee: Secondary | ICD-10-CM | POA: Diagnosis not present

## 2023-09-03 DIAGNOSIS — Z85828 Personal history of other malignant neoplasm of skin: Secondary | ICD-10-CM | POA: Diagnosis not present

## 2023-09-03 DIAGNOSIS — Z950 Presence of cardiac pacemaker: Secondary | ICD-10-CM | POA: Diagnosis not present

## 2023-09-03 LAB — GLUCOSE, CAPILLARY
Glucose-Capillary: 206 mg/dL — ABNORMAL HIGH (ref 70–99)
Glucose-Capillary: 232 mg/dL — ABNORMAL HIGH (ref 70–99)

## 2023-09-03 LAB — CBC
HCT: 29.1 % — ABNORMAL LOW (ref 36.0–46.0)
Hemoglobin: 9.4 g/dL — ABNORMAL LOW (ref 12.0–15.0)
MCH: 34.2 pg — ABNORMAL HIGH (ref 26.0–34.0)
MCHC: 32.3 g/dL (ref 30.0–36.0)
MCV: 105.8 fL — ABNORMAL HIGH (ref 80.0–100.0)
Platelets: 222 10*3/uL (ref 150–400)
RBC: 2.75 MIL/uL — ABNORMAL LOW (ref 3.87–5.11)
RDW: 14 % (ref 11.5–15.5)
WBC: 8.9 10*3/uL (ref 4.0–10.5)
nRBC: 0.2 % (ref 0.0–0.2)

## 2023-09-03 LAB — BASIC METABOLIC PANEL WITH GFR
Anion gap: 5 (ref 5–15)
BUN: 27 mg/dL — ABNORMAL HIGH (ref 8–23)
CO2: 27 mmol/L (ref 22–32)
Calcium: 8.2 mg/dL — ABNORMAL LOW (ref 8.9–10.3)
Chloride: 104 mmol/L (ref 98–111)
Creatinine, Ser: 1.51 mg/dL — ABNORMAL HIGH (ref 0.44–1.00)
GFR, Estimated: 35 mL/min — ABNORMAL LOW (ref >60)
Glucose, Bld: 251 mg/dL — ABNORMAL HIGH (ref 70–99)
Potassium: 4.1 mmol/L (ref 3.5–5.1)
Sodium: 136 mmol/L (ref 135–145)

## 2023-09-03 MED ORDER — OXYCODONE HCL 5 MG PO TABS
5.0000 mg | ORAL_TABLET | Freq: Four times a day (QID) | ORAL | 0 refills | Status: DC | PRN
Start: 2023-09-03 — End: 2023-09-16

## 2023-09-03 MED ORDER — ONDANSETRON HCL 4 MG PO TABS: 4.0000 mg | ORAL_TABLET | Freq: Four times a day (QID) | ORAL | 0 refills | Status: DC | PRN

## 2023-09-03 MED ORDER — METHOCARBAMOL 500 MG PO TABS: 500.0000 mg | ORAL_TABLET | Freq: Four times a day (QID) | ORAL | 0 refills | Status: DC | PRN

## 2023-09-03 NOTE — Progress Notes (Addendum)
 Physical Therapy Treatment Patient Details Name: Alicia [name] MRN: [id number] DOB: [birth year] Today's Date: 09/03/2023   History of Present Illness 78 yo female s/p R TKA 09/02/23    PT Comments  Pt agreeable to working with therapy. Moderate pain on today. BP elevated: 196/44. Will plan to have a 2nd session prior to d/c later today. Plan is for OP PT f/u.    If plan is discharge home, recommend the following: A little help with walking and/or transfers;A little help with bathing/dressing/bathroom;Assistance with cooking/housework;Assist for transportation;Help with stairs or ramp for entrance   Can travel by private vehicle        Equipment Recommendations  Rolling walker (2 wheels)    Recommendations for Other Services       Precautions / Restrictions Precautions Precautions: Fall;Knee Restrictions Weight Bearing Restrictions Per Provider Order: No RLE Weight Bearing Per Provider Order: Weight bearing as tolerated     Mobility  Bed Mobility Overal bed mobility: Needs Assistance Bed Mobility: Supine to Sit     Supine to sit: Supervision, HOB elevated          Transfers Overall transfer level: Needs assistance Equipment used: Rolling walker (2 wheels) Transfers: Sit to/from Stand Sit to Stand: Supervision           General transfer comment: Cues for safety, technique, hand/LE placement.    Ambulation/Gait Ambulation/Gait assistance: Contact guard assist Gait Distance (Feet): 75 Feet Assistive device: Rolling walker (2 wheels) Gait Pattern/deviations: Step-to pattern       General Gait Details: Cues for safety, sequencing. Slow but steady gait. Pt denied dizziness.   Stairs             Wheelchair Mobility     Tilt Bed    Modified Rankin (Stroke Patients Only)       Balance Overall balance assessment: Needs assistance         Standing balance support: Bilateral upper extremity supported, During functional activity, Reliant on  assistive device for balance Standing balance-Leahy Scale: Fair                              Hotel manager: No apparent difficulties  Cognition Arousal: Alert Behavior During Therapy: WFL for tasks assessed/performed   PT - Cognitive impairments: No apparent impairments                         Following commands: Intact      Cueing Cueing Techniques: Verbal cues  Exercises Total Joint Exercises Ankle Circles/Pumps: AROM, Both, 10 reps Quad Sets: AROM, Both, 10 reps Hip ABduction/ADduction: AROM, Right, 10 reps Straight Leg Raises: AROM, Right, 10 reps Knee Flexion: AROM, Right, 10 reps, Seated Goniometric ROM: ~10-65 degrees    General Comments        Pertinent Vitals/Pain Pain Assessment Pain Assessment: 0-10 Pain Score: 5  Pain Location: R knee/thigh Pain Descriptors / Indicators: Aching Pain Intervention(s): Monitored during session, Ice applied, Repositioned    Home Living                          Prior Function            PT Goals (current goals can now be found in the care plan section) Progress towards PT goals: Progressing toward goals    Frequency    7X/week      PT Plan  Co-evaluation              AM-PAC PT "6 Clicks" Mobility   Outcome Measure  Help needed turning from your back to your side while in a flat bed without using bedrails?: None Help needed moving from lying on your back to sitting on the side of a flat bed without using bedrails?: A Little Help needed moving to and from a bed to a chair (including a wheelchair)?: A Little Help needed standing up from a chair using your arms (e.g., wheelchair or bedside chair)?: A Little Help needed to walk in hospital room?: A Little Help needed climbing 3-5 steps with a railing? : A Little 6 Click Score: 19    End of Session Equipment Utilized During Treatment: Gait belt Activity Tolerance: Patient tolerated treatment  well Patient left: in chair;with call bell/phone within reach;with family/visitor present   PT Visit Diagnosis: Other abnormalities of gait and mobility (R26.89);Pain Pain - Right/Left: Right Pain - part of body: Knee     Time: 1610-9604 PT Time Calculation (min) (ACUTE ONLY): 26 min  Charges:    $Gait Training: 8-22 mins $Therapeutic Exercise: 8-22 mins PT General Charges $$ ACUTE PT VISIT: 1 Visit                         Tanda Falter, PT Acute Rehabilitation  Office: [phone]

## 2023-09-03 NOTE — Progress Notes (Signed)
 Discharge paperwork printed and reviewed with the pt. Pt verbalized understanding. Daughter providing transportation to home.

## 2023-09-03 NOTE — Care Management Obs Status (Signed)
 MEDICARE OBSERVATION STATUS NOTIFICATION   Patient Details  Name: Alicia [name] MRN: [id number] Date of Birth: November 13, 1945   Medicare Observation Status Notification Given:  Yes    Bari Leys, RN 09/03/2023, 10:19 AM

## 2023-09-03 NOTE — Progress Notes (Signed)
 Subjective: 1 Day Post-Op Procedure(s) (LRB): ARTHROPLASTY, KNEE, TOTAL (Right) Patient reports pain as mild.   Patient seen in rounds by Dr. France Ina. Patient is well, and has had no acute complaints or problems No issues overnight. Denies chest pain, SOB, or calf pain. Foley catheter to be removed this AM.  We will continue therapy today, ambulated 40' yesterday.   Objective: Vital signs in last 24 hours: Temp:  [97.5 F (36.4 C)-98.6 F (37 C)] 97.5 F (36.4 C) (04/29 9604) Pulse Rate:  [45-66] 59 (04/29 0632) Resp:  [14-25] 16 (04/29 0632) BP: (124-181)/(42-88) 160/88 (04/29 0632) SpO2:  [88 %-100 %] 100 % (04/29 5409)  Intake/Output from previous day:  Intake/Output Summary (Last 24 hours) at 09/03/2023 0720 Last data filed at 09/03/2023 8119 Gross per 24 hour  Intake 3031.93 ml  Output 2000 ml  Net 1031.93 ml     Intake/Output this shift: No intake/output data recorded.  Labs: Recent Labs    09/03/23 0328  HGB 9.4*   Recent Labs    09/03/23 0328  WBC 8.9  RBC 2.75*  HCT 29.1*  PLT 222   Recent Labs    09/03/23 0328  NA 136  K 4.1  CL 104  CO2 27  BUN 27*  CREATININE 1.51*  GLUCOSE 251*  CALCIUM 8.2*   No results for input(s): "LABPT", "INR" in the last 72 hours.  Exam: General - Patient is Alert and Oriented Extremity - Neurologically intact Neurovascular intact Sensation intact distally Dorsiflexion/Plantar flexion intact Dressing - dressing C/D/I Motor Function - intact, moving foot and toes well on exam.   Past Medical History:  Diagnosis Date   Anemia    hx of   Anxiety    Cancer (HCC)    endometrial cancer/ skin cancers removed nose non melanoma   Diabetes (HCC)    Type 2   DJD (degenerative joint disease)    Dysrhythmia    SYMPTOMATIC BRADYCARDIA  / A. FIB   Family history of breast cancer    Family history of cancer    Gallstones    History of radiation therapy 04/04/17-04/25/17   vaginal cuff 30 Gy in 5 fractions    Hypertension    HCTZ   Hypothyroidism    Obesity    Pacemaker 12/15/2012   DUAL CHAMBER    DR Carolynne Citron   Sleep apnea    No machine Cant tolerate   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   Wears glasses     Assessment/Plan: 1 Day Post-Op Procedure(s) (LRB): ARTHROPLASTY, KNEE, TOTAL (Right) Principal Problem:   Osteoarthritis of knee Active Problems:   Primary osteoarthritis of right knee  Estimated body mass index is 37.53 kg/m as calculated from the following:   Height as of this encounter: 5' 5.5" (1.664 m).   Weight as of this encounter: 103.9 kg. Advance diet Up with therapy D/C IV fluids   Patient's anticipated LOS is less than 2 midnights, meeting these requirements: - Lives within 1 hour of care - Has a competent adult at home to recover with post-op - NO history of             - Chronic pain requiring opiods             - Coronary Artery Disease             - Heart failure             - Heart attack             -  Stroke             - DVT/VTE             - Respiratory Failure/COPD             - Renal failure             - Anemia             - Advanced Liver disease   DVT Prophylaxis -  Eliquis  Weight bearing as tolerated. Continue therapy. Glucose elevated this morning  Plan is to go Home after hospital stay. Plan for discharge later today if progresses with therapy and meeting goals. Scheduled for OPPT at [facility]. Follow-up in the office in 2 weeks.  The PDMP database was reviewed today prior to any opioid medications being prescribed to this patient.  Sharlynn Dear, PA-C Orthopedic Surgery [phone] 09/03/2023, 7:20 AM

## 2023-09-03 NOTE — Progress Notes (Signed)
 NT notified about elevated BP of 196/44 after PT. BP rechecked with a new BP cuff which was 169/58. Pt has no c/o pain or distress. Will continue to monitor.

## 2023-09-03 NOTE — Plan of Care (Signed)

## 2023-09-03 NOTE — TOC Transition Note (Signed)
 Transition of Care [facility]) - Discharge Note   Patient Details  Name: Alicia [name] MRN: [id number] Date of Birth: 02-07-1946  Transition of Care  [facility]) CM/SW Contact:  Bari Leys, RN Phone Number: 09/03/2023, 10:47 AM   Clinical Narrative:   Met with pt at bedside to review dc therapy and home DME needs, pt confirmed OPPT [facility], RW delivered by bedside by [facility]. No TOC needs.      Final next level of care: OP Rehab Barriers to Discharge: No Barriers Identified   Patient Goals and CMS Choice Patient states their goals for this hospitalization and ongoing recovery are:: return home          Discharge Placement                       Discharge Plan and Services Additional resources added to the After Visit Summary for                  DME Arranged: Walker rolling DME Agency: [facility]                  Social Drivers of Health (SDOH) Interventions SDOH Screenings   Food Insecurity: No Food Insecurity (09/02/2023)  Housing: Low Risk  (09/02/2023)  Transportation Needs: No Transportation Needs (09/02/2023)  Utilities: Not At Risk (09/02/2023)  Social Connections: Moderately Isolated (09/02/2023)  Tobacco Use: Low Risk  (09/02/2023)     Readmission Risk Interventions     No data to display

## 2023-09-03 NOTE — Progress Notes (Signed)
 Physical Therapy Treatment Patient Details Name: Alicia [name] MRN: [id number] DOB: October 30, 1945 Today's Date: 09/03/2023   History of Present Illness 78 yo female s/p R TKA 09/02/23    PT Comments  2nd session to practice stair step negotiation. Encouraged pt to ambulate often at home. Also encouraged her to perform APs and QSs as tolerated. PT education completed.     If plan is discharge home, recommend the following: A little help with walking and/or transfers;A little help with bathing/dressing/bathroom;Assistance with cooking/housework;Assist for transportation;Help with stairs or ramp for entrance   Can travel by private vehicle        Equipment Recommendations       Recommendations for Other Services       Precautions / Restrictions Precautions Precautions: Fall;Knee Restrictions Weight Bearing Restrictions Per Provider Order: No RLE Weight Bearing Per Provider Order: Weight bearing as tolerated     Mobility  Bed Mobility Overal bed mobility: Needs Assistance Bed Mobility: Supine to Sit     Supine to sit: Supervision, HOB elevated     General bed mobility comments: oob in recliner    Transfers Overall transfer level: Needs assistance Equipment used: Rolling walker (2 wheels) Transfers: Sit to/from Stand Sit to Stand: Supervision           General transfer comment: Cues for safety, technique, hand/LE placement.    Ambulation/Gait Ambulation/Gait assistance: Supervision Gait Distance (Feet): 60 Feet Assistive device: Rolling walker (2 wheels) Gait Pattern/deviations: Step-to pattern       General Gait Details: Cues for safety, sequencing. Slow but steady gait. Pt denied dizziness.   Stairs Stairs: Yes Stairs assistance: Contact guard assist Stair Management: Step to pattern, Forwards, With walker Number of Stairs: 1 General stair comments: up and over curb step. cues for safety, technqiue, sequence.   Wheelchair Mobility     Tilt Bed     Modified Rankin (Stroke Patients Only)       Balance Overall balance assessment: Needs assistance         Standing balance support: Bilateral upper extremity supported, During functional activity, Reliant on assistive device for balance Standing balance-Leahy Scale: Fair                              Hotel manager: No apparent difficulties  Cognition Arousal: Alert Behavior During Therapy: WFL for tasks assessed/performed   PT - Cognitive impairments: No apparent impairments                         Following commands: Intact      Cueing Cueing Techniques: Verbal cues  Exercises     General Comments        Pertinent Vitals/Pain Pain Assessment Pain Assessment: Faces Pain Score: 5  Faces Pain Scale: Hurts little more Pain Location: R knee/thigh Pain Descriptors / Indicators: Aching Pain Intervention(s): Monitored during session, Repositioned    Home Living                          Prior Function            PT Goals (current goals can now be found in the care plan section) Progress towards PT goals: Progressing toward goals    Frequency    7X/week      PT Plan      Co-evaluation  AM-PAC PT "6 Clicks" Mobility   Outcome Measure  Help needed turning from your back to your side while in a flat bed without using bedrails?: None Help needed moving from lying on your back to sitting on the side of a flat bed without using bedrails?: A Little Help needed moving to and from a bed to a chair (including a wheelchair)?: A Little Help needed standing up from a chair using your arms (e.g., wheelchair or bedside chair)?: A Little Help needed to walk in hospital room?: A Little Help needed climbing 3-5 steps with a railing? : A Little 6 Click Score: 19    End of Session Equipment Utilized During Treatment: Gait belt Activity Tolerance: Patient tolerated treatment well Patient  left: in chair;with call bell/phone within reach;with family/visitor present   PT Visit Diagnosis: Other abnormalities of gait and mobility (R26.89);Pain Pain - Right/Left: Right Pain - part of body: Knee     Time: 7846-9629 PT Time Calculation (min) (ACUTE ONLY): 19 min  Charges:    $Gait Training: 8-22 mins $Therapeutic Exercise: 8-22 mins PT General Charges $$ ACUTE PT VISIT: 1 Visit                         Tanda Falter, PT Acute Rehabilitation  Office: [phone]

## 2023-09-04 NOTE — Discharge Summary (Signed)
 Patient ID: Alicia [name] MRN: [id number] DOB/AGE: October 06, 1945 78 y.o.  Admit date: 09/02/2023 Discharge date: 09/03/2023  Admission Diagnoses:  Principal Problem:   Osteoarthritis of knee Active Problems:   Primary osteoarthritis of right knee   Discharge Diagnoses:  Same  Past Medical History:  Diagnosis Date   Anemia    hx of   Anxiety    Cancer (HCC)    endometrial cancer/ skin cancers removed nose non melanoma   Diabetes (HCC)    Type 2   DJD (degenerative joint disease)    Dysrhythmia    SYMPTOMATIC BRADYCARDIA  / A. FIB   Family history of breast cancer    Family history of cancer    Gallstones    History of radiation therapy 04/04/17-04/25/17   vaginal cuff 30 Gy in 5 fractions   Hypertension    HCTZ   Hypothyroidism    Obesity    Pacemaker 12/15/2012   DUAL CHAMBER    DR Carolynne Citron   Sleep apnea    No machine Cant tolerate   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   Wears glasses     Surgeries: Procedure(s): ARTHROPLASTY, KNEE, TOTAL on 09/02/2023   Consultants:   Discharged Condition: Improved  Hospital Course: AKAYLA BRANIFF is an 78 y.o. female who was admitted 09/02/2023 for operative treatment ofOsteoarthritis of knee. Patient has severe unremitting pain that affects sleep, daily activities, and work/hobbies. After pre-op clearance the patient was taken to the operating room on 09/02/2023 and underwent  Procedure(s): ARTHROPLASTY, KNEE, TOTAL.    Patient was given perioperative antibiotics:  Anti-infectives (From admission, onward)    Start     Dose/Rate Route Frequency Ordered Stop   09/02/23 1300  ceFAZolin  (ANCEF ) IVPB 2g/100 mL premix        2 g 200 mL/hr over 30 Minutes Intravenous Every 6 hours 09/02/23 1045 09/02/23 1856   09/02/23 0600  ceFAZolin  (ANCEF ) IVPB 2g/100 mL premix        2 g 200 mL/hr over 30 Minutes Intravenous On call to O.R. 09/02/23 6213 09/02/23 0865        Patient was given sequential compression devices, early ambulation,  and chemoprophylaxis to prevent DVT.  Patient benefited maximally from hospital stay and there were no complications.    Recent vital signs: Patient Vitals for the past 24 hrs:  BP Temp Temp src Pulse Resp SpO2  09/03/23 1322 (!) 159/54 97.6 F (36.4 C) Oral 60 16 95 %  09/03/23 1043 (!) 169/58 97.7 F (36.5 C) -- 60 -- --  09/03/23 1010 (!) 196/44 -- -- -- -- --  09/03/23 0957 (!) 212/52 -- -- (!) 59 17 94 %  09/03/23 0834 (!) 141/39 -- -- 60 16 99 %     Recent laboratory studies:  Recent Labs    09/03/23 0328  WBC 8.9  HGB 9.4*  HCT 29.1*  PLT 222  NA 136  K 4.1  CL 104  CO2 27  BUN 27*  CREATININE 1.51*  GLUCOSE 251*  CALCIUM 8.2*     Discharge Medications:   Allergies as of 09/03/2023       Reactions   Ace Inhibitors Cough   Cardizem  [diltiazem  Hcl] Other (See Comments)   headache   Misc. Sulfonamide Containing Compounds Other (See Comments)   Oxycodone  Nausea Only   Statins Other (See Comments)   myalgias   Sulfa Antibiotics Hives, Swelling   Tape    Paper tape is okay   Aspirin  Rash  Cortisone Rash   flushing   Fd&c Yellow #5 (tartrazine) Rash   Latex Rash   Sensitive not allergic        Medication List     STOP taking these medications    cephALEXin  500 MG capsule Commonly known as: KEFLEX        TAKE these medications    apixaban  5 MG Tabs tablet Commonly known as: Eliquis  Take 1 tablet (5 mg total) by mouth 2 (two) times daily.   citalopram  10 MG tablet Commonly known as: CELEXA  Take 10 mg by mouth every Monday, Wednesday, and Friday.   glipiZIDE  2.5 MG 24 hr tablet Commonly known as: GLUCOTROL  XL Take 2.5 mg by mouth daily with breakfast.   hydrochlorothiazide 25 MG tablet Commonly known as: HYDRODIURIL Take 25 mg by mouth in the morning.   IMMUNE FORMULA PO Take 1 capsule by mouth in the morning and at bedtime. ESSIAC Herbal Extract Capsules Powerful Antioxidant Blend   insulin  isophane & regular human KwikPen (70-30)  100 UNIT/ML KwikPen Commonly known as: HUMULIN  70/30 MIX Inject 50 Units into the skin 2 (two) times daily with a meal.   INSULIN  SYRINGE 1CC/29G 29G X 1/2" 1 ML Misc   Global Inject Ease Insulin  Syr 31G X 5/16" 1 ML Misc Generic drug: Insulin  Syringe-Needle U-100 Inject into the skin.   levothyroxine  112 MCG tablet Commonly known as: SYNTHROID  TAKE 1 TABLET BY MOUTH ONCE DAILY BEFORE BREAKFAST   loratadine 10 MG tablet Commonly known as: CLARITIN Take 10 mg by mouth in the morning.   LUTEIN-ZEAXANTHIN PO Take 1 tablet by mouth at bedtime.   methocarbamol  500 MG tablet Commonly known as: ROBAXIN  Take 1 tablet (500 mg total) by mouth every 6 (six) hours as needed for muscle spasms.   ondansetron  4 MG tablet Commonly known as: ZOFRAN  Take 1 tablet (4 mg total) by mouth every 6 (six) hours as needed for nausea.   oxyCODONE  5 MG immediate release tablet Commonly known as: Oxy IR/ROXICODONE  Take 1-2 tablets (5-10 mg total) by mouth every 6 (six) hours as needed for severe pain (pain score 7-10).   PROBIOTIC DAILY PO Take 1 capsule by mouth in the morning.   Systane 0.4-0.3 % Soln Generic drug: Polyethyl Glycol-Propyl Glycol Place 1-2 drops into both eyes 3 (three) times daily as needed (dry/irritated eyes.).               Discharge Care Instructions  (From admission, onward)           Start     Ordered   09/03/23 0000  Weight bearing as tolerated        09/03/23 0723   09/03/23 0000  Change dressing       Comments: You may remove the bulky bandage (ACE wrap and gauze) two days after surgery. You will have an adhesive waterproof bandage underneath. Leave this in place until your first follow-up appointment.   09/03/23 0723            Diagnostic Studies: CUP PACEART REMOTE DEVICE CHECK Result Date: 08/19/2023 Scheduled remote reviewed. Normal device function.  Presenting rhythm: SR, frequent PACs. 3 AHR detections, longest 1.5 hours Next remote 91 days. -  CS, CVRS Scheduled remote reviewed. Normal device function.  Presenting rhythm: SR, frequent PACs. 3 AHR detections, longest 1.5 hours, EGMs consistent with AF, known history on Eliquis  per Epic. Next remote 91 days. - CS, CVRS   Disposition: Discharge disposition: 01-Home or Self Care  Discharge Instructions     Call MD / Call 911   Complete by: As directed    If you experience chest pain or shortness of breath, CALL 911 and be transported to the hospital emergency room.  If you develope a fever above 101 F, pus (white drainage) or increased drainage or redness at the wound, or calf pain, call your surgeon's office.   Change dressing   Complete by: As directed    You may remove the bulky bandage (ACE wrap and gauze) two days after surgery. You will have an adhesive waterproof bandage underneath. Leave this in place until your first follow-up appointment.   Constipation Prevention   Complete by: As directed    Drink plenty of fluids.  Prune juice may be helpful.  You may use a stool softener, such as Colace (over the counter) 100 mg twice a day.  Use MiraLax (over the counter) for constipation as needed.   Diet - low sodium heart healthy   Complete by: As directed    Do not put a pillow under the knee. Place it under the heel.   Complete by: As directed    Driving restrictions   Complete by: As directed    No driving for two weeks   Post-operative opioid taper instructions:   Complete by: As directed    POST-OPERATIVE OPIOID TAPER INSTRUCTIONS: It is important to wean off of your opioid medication as soon as possible. If you do not need pain medication after your surgery it is ok to stop day one. Opioids include: Codeine, Hydrocodone(Norco, Vicodin), Oxycodone (Percocet, oxycontin ) and hydromorphone  amongst others.  Long term and even short term use of opiods can cause: Increased pain response Dependence Constipation Depression Respiratory depression And more.  Withdrawal  symptoms can include Flu like symptoms Nausea, vomiting And more Techniques to manage these symptoms Hydrate well Eat regular healthy meals Stay active Use relaxation techniques(deep breathing, meditating, yoga) Do Not substitute Alcohol  to help with tapering If you have been on opioids for less than two weeks and do not have pain than it is ok to stop all together.  Plan to wean off of opioids This plan should start within one week post op of your joint replacement. Maintain the same interval or time between taking each dose and first decrease the dose.  Cut the total daily intake of opioids by one tablet each day Next start to increase the time between doses. The last dose that should be eliminated is the evening dose.      TED hose   Complete by: As directed    Use stockings (TED hose) for three weeks on both leg(s).  You may remove them at night for sleeping.   Weight bearing as tolerated   Complete by: As directed         Follow-up Information     Liliane Rei, MD. Go on 09/18/2023.   Specialty: Orthopedic Surgery Why: You are scheduled for a post op appointment on 09/18/23 at 3:45pm Contact information: [address] [place] [address] [phone]                  Signed: Sharlynn Dear 09/04/2023, 8:16 AM

## 2023-09-05 DIAGNOSIS — M25562 Pain in left knee: Secondary | ICD-10-CM | POA: Diagnosis not present

## 2023-09-05 DIAGNOSIS — M25561 Pain in right knee: Secondary | ICD-10-CM | POA: Diagnosis not present

## 2023-09-05 DIAGNOSIS — R262 Difficulty in walking, not elsewhere classified: Secondary | ICD-10-CM | POA: Diagnosis not present

## 2023-09-05 DIAGNOSIS — M6281 Muscle weakness (generalized): Secondary | ICD-10-CM | POA: Diagnosis not present

## 2023-09-07 ENCOUNTER — Other Ambulatory Visit: Payer: Self-pay

## 2023-09-07 ENCOUNTER — Encounter (HOSPITAL_COMMUNITY): Payer: Self-pay | Admitting: Emergency Medicine

## 2023-09-07 ENCOUNTER — Emergency Department (HOSPITAL_COMMUNITY)
Admission: EM | Admit: 2023-09-07 | Discharge: 2023-09-07 | Disposition: A | Discharge status: Home / Self Care | Attending: Emergency Medicine | Admitting: Emergency Medicine

## 2023-09-07 ENCOUNTER — Emergency Department (HOSPITAL_COMMUNITY)

## 2023-09-07 DIAGNOSIS — E119 Type 2 diabetes mellitus without complications: Secondary | ICD-10-CM | POA: Insufficient documentation

## 2023-09-07 DIAGNOSIS — K5903 Drug induced constipation: Secondary | ICD-10-CM | POA: Insufficient documentation

## 2023-09-07 DIAGNOSIS — Z91040 Latex allergy status: Secondary | ICD-10-CM | POA: Diagnosis not present

## 2023-09-07 DIAGNOSIS — Z8542 Personal history of malignant neoplasm of other parts of uterus: Secondary | ICD-10-CM | POA: Diagnosis not present

## 2023-09-07 DIAGNOSIS — Z7901 Long term (current) use of anticoagulants: Secondary | ICD-10-CM | POA: Diagnosis not present

## 2023-09-07 DIAGNOSIS — I10 Essential (primary) hypertension: Secondary | ICD-10-CM | POA: Diagnosis not present

## 2023-09-07 DIAGNOSIS — T402X5A Adverse effect of other opioids, initial encounter: Secondary | ICD-10-CM | POA: Diagnosis not present

## 2023-09-07 DIAGNOSIS — Z950 Presence of cardiac pacemaker: Secondary | ICD-10-CM | POA: Diagnosis not present

## 2023-09-07 DIAGNOSIS — Z794 Long term (current) use of insulin: Secondary | ICD-10-CM | POA: Insufficient documentation

## 2023-09-07 DIAGNOSIS — Z85828 Personal history of other malignant neoplasm of skin: Secondary | ICD-10-CM | POA: Diagnosis not present

## 2023-09-07 DIAGNOSIS — Z79899 Other long term (current) drug therapy: Secondary | ICD-10-CM | POA: Insufficient documentation

## 2023-09-07 DIAGNOSIS — K5900 Constipation, unspecified: Secondary | ICD-10-CM | POA: Diagnosis not present

## 2023-09-07 DIAGNOSIS — E039 Hypothyroidism, unspecified: Secondary | ICD-10-CM | POA: Diagnosis not present

## 2023-09-07 NOTE — ED Triage Notes (Signed)
 Pt reports she had knee replacement surgery Monday, has been on pain medication and has not had a BM since 09/01/23, no relief with Colace

## 2023-09-07 NOTE — Discharge Instructions (Signed)
 You were seen today for constipation.  This is likely related to your current opioid regimen.  If you are able to titrate down your pain medication, this will be helpful.  Continue Colace.  Increase water  and fluids in your diet.  If you find that you are not having bowel movements, you may start taking MiraLAX .  You may start with 1 capful daily.  Follow-up with your primary doctor.

## 2023-09-07 NOTE — ED Provider Notes (Signed)
 [place] EMERGENCY DEPARTMENT AT [facility] Provider Note   CSN: [id number] Arrival date & time: 09/07/23  0003     History  Chief Complaint  Patient presents with   Constipation    Alicia [name] is a 78 y.o. female.  HPI     This is a 78 year old female who presents with concerns for constipation.  Patient reports she had a knee replacement on Monday.  Since that time she has been taking opiates for analgesia.  She states that she was given Colace to take as well.  She has not had a bowel movement since Monday.  She states that she feels uncomfortable.  She has tried multiple times to go to the bathroom but at this point does not feel like "I can push it out."  She reports she has felt dizzy after multiple attempts of trying to have a bowel movement.  She states at baseline she normally has diarrhea and has had to see gastroenterologist in the past for this.  Denies nausea or vomiting.  Denies abdominal pain but does report some generalized discomfort.  Home Medications Prior to Admission medications   Medication Sig Start Date End Date Taking? Authorizing Provider  apixaban  (ELIQUIS ) 5 MG TABS tablet Take 1 tablet (5 mg total) by mouth 2 (two) times daily. 04/22/20   Jolly Needle, MD  citalopram  (CELEXA ) 10 MG tablet Take 10 mg by mouth every Monday, Wednesday, and Friday. 09/25/22   [provider]  glipiZIDE  (GLUCOTROL  XL) 2.5 MG 24 hr tablet Take 2.5 mg by mouth daily with breakfast.    [provider]  GLOBAL INJECT EASE INSULIN  SYR 31G X 5/16" 1 ML MISC Inject into the skin. 02/23/23   [provider]  hydrochlorothiazide  (HYDRODIURIL ) 25 MG tablet Take 25 mg by mouth in the morning. 08/13/16   [provider]  insulin  isophane & regular human KwikPen (HUMULIN  70/30 MIX) (70-30) 100 UNIT/ML KwikPen Inject 50 Units into the skin 2 (two) times daily with a meal.    [provider]  INSULIN  SYRINGE 1CC/29G 29G X 1/2" 1 ML MISC   10/05/14   [provider]  levothyroxine  (SYNTHROID ) 112 MCG tablet TAKE 1 TABLET BY MOUTH ONCE DAILY BEFORE BREAKFAST 08/03/21   [name], MD  loratadine (CLARITIN) 10 MG tablet Take 10 mg by mouth in the morning.    [provider]  methocarbamol  (ROBAXIN ) 500 MG tablet Take 1 tablet (500 mg total) by mouth every 6 (six) hours as needed for muscle spasms. 09/03/23   Edmisten, Onesimo Bijou, PA  Misc Natural Products (IMMUNE FORMULA PO) Take 1 capsule by mouth in the morning and at bedtime. ESSIAC Herbal Extract Capsules Powerful Antioxidant Blend    [provider]  Multiple Vitamins-Minerals (LUTEIN-ZEAXANTHIN PO) Take 1 tablet by mouth at bedtime.    [provider]  ondansetron  (ZOFRAN ) 4 MG tablet Take 1 tablet (4 mg total) by mouth every 6 (six) hours as needed for nausea. 09/03/23   Edmisten, Onesimo Bijou, PA  oxyCODONE  (OXY IR/ROXICODONE ) 5 MG immediate release tablet Take 1-2 tablets (5-10 mg total) by mouth every 6 (six) hours as needed for severe pain (pain score 7-10). 09/03/23   Edmisten, Onesimo Bijou, PA  Polyethyl Glycol-Propyl Glycol (SYSTANE) 0.4-0.3 % SOLN Place 1-2 drops into both eyes 3 (three) times daily as needed (dry/irritated eyes.).    [provider]  Probiotic Product (PROBIOTIC DAILY PO) Take 1 capsule by mouth in the morning.  [provider]      Allergies    Ace inhibitors, Cardizem  [diltiazem  hcl], Misc. sulfonamide containing compounds, Oxycodone , Statins, Sulfa antibiotics, Tape, Aspirin , Cortisone, Fd&c yellow #5 (tartrazine), and Latex    Review of Systems   Review of Systems  Constitutional:  Negative for fever.  Respiratory:  Negative for shortness of breath.   Cardiovascular:  Negative for chest pain.  Gastrointestinal:  Positive for constipation. Negative for nausea and vomiting.  All other systems reviewed and are negative.   Physical Exam Updated Vital Signs BP (!) 159/49   Pulse 72   Temp 97.7 F  (36.5 C) (Oral)   Resp 17   Ht 1.664 m (5' 5.5")   Wt 103.4 kg   SpO2 92%   BMI 37.36 kg/m  Physical Exam Vitals and nursing note reviewed.  Constitutional:      Appearance: She is well-developed. She is obese. She is not ill-appearing.  HENT:     Head: Normocephalic and atraumatic.  Eyes:     Pupils: Pupils are equal, round, and reactive to light.  Cardiovascular:     Rate and Rhythm: Normal rate and regular rhythm.     Heart sounds: Normal heart sounds.  Pulmonary:     Effort: Pulmonary effort is normal. No respiratory distress.     Breath sounds: No wheezing.  Abdominal:     General: Bowel sounds are normal.     Palpations: Abdomen is soft.     Tenderness: There is no abdominal tenderness. There is no guarding or rebound.  Musculoskeletal:     Cervical back: Neck supple.     Comments: Incision over the anterior right knee clean dry and intact, no dehiscence, no erythema  Skin:    General: Skin is warm and dry.  Neurological:     Mental Status: She is alert and oriented to person, place, and time.  Psychiatric:        Mood and Affect: Mood normal.     ED Results / Procedures / Treatments   Labs (all labs ordered are listed, but only abnormal results are displayed) Labs Reviewed - No data to display  EKG None  Radiology DG Abdomen 1 View Result Date: 09/07/2023 CLINICAL DATA:  Knee replacement surgery Monday and been on pain medication since. Has not had a bowel movement since 09/01/2023. Constipation. EXAM: ABDOMEN - 1 VIEW COMPARISON:  None Available. FINDINGS: Moderate stool in the colon and rectum. No evidence of obstruction. Cholecystectomy. IMPRESSION: Moderate stool in the colon and rectum. Electronically Signed   By: Rozell Cornet M.D.   On: 09/07/2023 00:36    Procedures Procedures    Medications Ordered in ED Medications - No data to display  ED Course/ Medical Decision Making/ A&P Clinical Course as of 09/07/23 0152  Sat Sep 07, 2023  0150 Per  nursing, patient had large bowel movement with enema and felt much better. [CH]    Clinical Course User Index [CH] Yaniv Lage, Vonzella Guernsey, MD                                 Medical Decision Making Amount and/or Complexity of Data Reviewed Radiology: ordered.   This patient presents to the ED for concern of constipation, this involves an extensive number of treatment options, and is a complaint that carries with it a high risk of complications and morbidity.  I considered the following differential and admission for this acute,  potentially life threatening condition.  The differential diagnosis includes opiate induced constipation, SBO, fecal impaction  MDM:    This is a 78 year old female who presents with concern for constipation.  Currently on opiate analgesia.  She is nontoxic and vital signs are reassuring.  Abdominal exam is benign.  X-ray does not show any evidence of obstructive pathology and does show a moderate stool burden.  I gave patient's option including going home with an enema and laxative versus trying an enema here.  She feels like she needs some relief tonight.  Enema was administered and patient had a large bowel movement with resolution of her symptoms.  Recommend increasing water  and fluids in her diet.  She may start MiraLAX  if she is not having regular bowel movements.  She may also titrate down her pain medication as she tolerates.  (Labs, imaging, consults)  Labs: I Ordered, and personally interpreted labs.  The pertinent results include: None  Imaging Studies ordered: I ordered imaging studies including abdominal film I independently visualized and interpreted imaging. I agree with the radiologist interpretation  Additional history obtained from chart review.  External records from outside source obtained and reviewed including operative notes  Cardiac Monitoring: The patient was not maintained on a cardiac monitor.  If on the cardiac monitor, I personally  viewed and interpreted the cardiac monitored which showed an underlying rhythm of: N/A  Reevaluation: After the interventions noted above, I reevaluated the patient and found that they have :improved  Social Determinants of Health:  lives independently  Disposition: Discharge  Co morbidities that complicate the patient evaluation  Past Medical History:  Diagnosis Date   Anemia    hx of   Anxiety    Cancer (HCC)    endometrial cancer/ skin cancers removed nose non melanoma   Diabetes (HCC)    Type 2   DJD (degenerative joint disease)    Dysrhythmia    SYMPTOMATIC BRADYCARDIA  / A. FIB   Family history of breast cancer    Family history of cancer    Gallstones    History of radiation therapy 04/04/17-04/25/17   vaginal cuff 30 Gy in 5 fractions   Hypertension    HCTZ   Hypothyroidism    Obesity    Pacemaker 12/15/2012   DUAL CHAMBER    DR Carolynne Citron   Sleep apnea    No machine Cant tolerate   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   Wears glasses      Medicines No orders of the defined types were placed in this encounter.   I have reviewed the patients home medicines and have made adjustments as needed  Problem List / ED Course: Problem List Items Addressed This Visit   None Visit Diagnoses       Constipation due to opioid therapy    -  Primary                   Final Clinical Impression(s) / ED Diagnoses Final diagnoses:  Constipation due to opioid therapy    Rx / DC Orders ED Discharge Orders     None         Rory Collard, MD 09/07/23 [phone]

## 2023-09-10 DIAGNOSIS — R262 Difficulty in walking, not elsewhere classified: Secondary | ICD-10-CM | POA: Diagnosis not present

## 2023-09-10 DIAGNOSIS — M25562 Pain in left knee: Secondary | ICD-10-CM | POA: Diagnosis not present

## 2023-09-10 DIAGNOSIS — M25561 Pain in right knee: Secondary | ICD-10-CM | POA: Diagnosis not present

## 2023-09-10 DIAGNOSIS — M6281 Muscle weakness (generalized): Secondary | ICD-10-CM | POA: Diagnosis not present

## 2023-09-11 DIAGNOSIS — M25561 Pain in right knee: Secondary | ICD-10-CM | POA: Diagnosis not present

## 2023-09-11 DIAGNOSIS — M6281 Muscle weakness (generalized): Secondary | ICD-10-CM | POA: Diagnosis not present

## 2023-09-11 DIAGNOSIS — M25562 Pain in left knee: Secondary | ICD-10-CM | POA: Diagnosis not present

## 2023-09-11 DIAGNOSIS — R262 Difficulty in walking, not elsewhere classified: Secondary | ICD-10-CM | POA: Diagnosis not present

## 2023-09-12 ENCOUNTER — Other Ambulatory Visit: Payer: Self-pay

## 2023-09-12 ENCOUNTER — Emergency Department (HOSPITAL_COMMUNITY)

## 2023-09-12 ENCOUNTER — Inpatient Hospital Stay (HOSPITAL_COMMUNITY)
Admission: EM | Admit: 2023-09-12 | Discharge: 2023-09-16 | DRG: 560 | Disposition: A | Discharge status: Skilled Nursing Facility | Attending: Family Medicine | Admitting: Family Medicine

## 2023-09-12 ENCOUNTER — Encounter (HOSPITAL_COMMUNITY): Payer: Self-pay | Admitting: Emergency Medicine

## 2023-09-12 DIAGNOSIS — F32A Depression, unspecified: Secondary | ICD-10-CM | POA: Diagnosis present

## 2023-09-12 DIAGNOSIS — D62 Acute posthemorrhagic anemia: Secondary | ICD-10-CM | POA: Diagnosis present

## 2023-09-12 DIAGNOSIS — S72491A Other fracture of lower end of right femur, initial encounter for closed fracture: Secondary | ICD-10-CM | POA: Diagnosis not present

## 2023-09-12 DIAGNOSIS — Z8249 Family history of ischemic heart disease and other diseases of the circulatory system: Secondary | ICD-10-CM

## 2023-09-12 DIAGNOSIS — S72401A Unspecified fracture of lower end of right femur, initial encounter for closed fracture: Principal | ICD-10-CM

## 2023-09-12 DIAGNOSIS — E119 Type 2 diabetes mellitus without complications: Secondary | ICD-10-CM

## 2023-09-12 DIAGNOSIS — R52 Pain, unspecified: Secondary | ICD-10-CM | POA: Diagnosis present

## 2023-09-12 DIAGNOSIS — Z809 Family history of malignant neoplasm, unspecified: Secondary | ICD-10-CM

## 2023-09-12 DIAGNOSIS — D6859 Other primary thrombophilia: Secondary | ICD-10-CM | POA: Diagnosis present

## 2023-09-12 DIAGNOSIS — T383X5A Adverse effect of insulin and oral hypoglycemic [antidiabetic] drugs, initial encounter: Secondary | ICD-10-CM | POA: Diagnosis not present

## 2023-09-12 DIAGNOSIS — Z794 Long term (current) use of insulin: Secondary | ICD-10-CM | POA: Diagnosis not present

## 2023-09-12 DIAGNOSIS — F419 Anxiety disorder, unspecified: Secondary | ICD-10-CM | POA: Diagnosis present

## 2023-09-12 DIAGNOSIS — Z923 Personal history of irradiation: Secondary | ICD-10-CM

## 2023-09-12 DIAGNOSIS — M7989 Other specified soft tissue disorders: Secondary | ICD-10-CM | POA: Diagnosis not present

## 2023-09-12 DIAGNOSIS — Z96651 Presence of right artificial knee joint: Secondary | ICD-10-CM | POA: Diagnosis not present

## 2023-09-12 DIAGNOSIS — Z471 Aftercare following joint replacement surgery: Secondary | ICD-10-CM | POA: Diagnosis not present

## 2023-09-12 DIAGNOSIS — R6889 Other general symptoms and signs: Secondary | ICD-10-CM | POA: Diagnosis not present

## 2023-09-12 DIAGNOSIS — Z91048 Other nonmedicinal substance allergy status: Secondary | ICD-10-CM

## 2023-09-12 DIAGNOSIS — S72434A Nondisplaced fracture of medial condyle of right femur, initial encounter for closed fracture: Secondary | ICD-10-CM | POA: Diagnosis not present

## 2023-09-12 DIAGNOSIS — S72431A Displaced fracture of medial condyle of right femur, initial encounter for closed fracture: Secondary | ICD-10-CM | POA: Diagnosis present

## 2023-09-12 DIAGNOSIS — Z85828 Personal history of other malignant neoplasm of skin: Secondary | ICD-10-CM

## 2023-09-12 DIAGNOSIS — E11649 Type 2 diabetes mellitus with hypoglycemia without coma: Secondary | ICD-10-CM | POA: Diagnosis not present

## 2023-09-12 DIAGNOSIS — Z91040 Latex allergy status: Secondary | ICD-10-CM

## 2023-09-12 DIAGNOSIS — Z7984 Long term (current) use of oral hypoglycemic drugs: Secondary | ICD-10-CM

## 2023-09-12 DIAGNOSIS — E039 Hypothyroidism, unspecified: Secondary | ICD-10-CM | POA: Diagnosis not present

## 2023-09-12 DIAGNOSIS — I10 Essential (primary) hypertension: Secondary | ICD-10-CM | POA: Diagnosis present

## 2023-09-12 DIAGNOSIS — S72434D Nondisplaced fracture of medial condyle of right femur, subsequent encounter for closed fracture with routine healing: Secondary | ICD-10-CM | POA: Diagnosis not present

## 2023-09-12 DIAGNOSIS — E1169 Type 2 diabetes mellitus with other specified complication: Secondary | ICD-10-CM

## 2023-09-12 DIAGNOSIS — T8484XA Pain due to internal orthopedic prosthetic devices, implants and grafts, initial encounter: Secondary | ICD-10-CM | POA: Diagnosis not present

## 2023-09-12 DIAGNOSIS — Z860100 Personal history of colon polyps, unspecified: Secondary | ICD-10-CM

## 2023-09-12 DIAGNOSIS — H579 Unspecified disorder of eye and adnexa: Secondary | ICD-10-CM | POA: Diagnosis not present

## 2023-09-12 DIAGNOSIS — Z885 Allergy status to narcotic agent status: Secondary | ICD-10-CM

## 2023-09-12 DIAGNOSIS — Z886 Allergy status to analgesic agent status: Secondary | ICD-10-CM

## 2023-09-12 DIAGNOSIS — J309 Allergic rhinitis, unspecified: Secondary | ICD-10-CM | POA: Diagnosis present

## 2023-09-12 DIAGNOSIS — M25461 Effusion, right knee: Secondary | ICD-10-CM | POA: Diagnosis not present

## 2023-09-12 DIAGNOSIS — Z888 Allergy status to other drugs, medicaments and biological substances status: Secondary | ICD-10-CM

## 2023-09-12 DIAGNOSIS — I441 Atrioventricular block, second degree: Secondary | ICD-10-CM | POA: Diagnosis present

## 2023-09-12 DIAGNOSIS — I4891 Unspecified atrial fibrillation: Secondary | ICD-10-CM | POA: Diagnosis not present

## 2023-09-12 DIAGNOSIS — Z9102 Food additives allergy status: Secondary | ICD-10-CM

## 2023-09-12 DIAGNOSIS — Z950 Presence of cardiac pacemaker: Secondary | ICD-10-CM | POA: Diagnosis not present

## 2023-09-12 DIAGNOSIS — Z7901 Long term (current) use of anticoagulants: Secondary | ICD-10-CM | POA: Diagnosis not present

## 2023-09-12 DIAGNOSIS — E669 Obesity, unspecified: Secondary | ICD-10-CM | POA: Diagnosis present

## 2023-09-12 DIAGNOSIS — Z743 Need for continuous supervision: Secondary | ICD-10-CM | POA: Diagnosis not present

## 2023-09-12 DIAGNOSIS — Z803 Family history of malignant neoplasm of breast: Secondary | ICD-10-CM

## 2023-09-12 DIAGNOSIS — Z79890 Hormone replacement therapy (postmenopausal): Secondary | ICD-10-CM

## 2023-09-12 DIAGNOSIS — Z8542 Personal history of malignant neoplasm of other parts of uterus: Secondary | ICD-10-CM

## 2023-09-12 DIAGNOSIS — R110 Nausea: Secondary | ICD-10-CM | POA: Diagnosis not present

## 2023-09-12 DIAGNOSIS — Z823 Family history of stroke: Secondary | ICD-10-CM

## 2023-09-12 DIAGNOSIS — S8991XA Unspecified injury of right lower leg, initial encounter: Secondary | ICD-10-CM | POA: Diagnosis not present

## 2023-09-12 DIAGNOSIS — Z79899 Other long term (current) drug therapy: Secondary | ICD-10-CM

## 2023-09-12 DIAGNOSIS — Z882 Allergy status to sulfonamides status: Secondary | ICD-10-CM

## 2023-09-12 DIAGNOSIS — Z808 Family history of malignant neoplasm of other organs or systems: Secondary | ICD-10-CM

## 2023-09-12 DIAGNOSIS — Y792 Prosthetic and other implants, materials and accessory orthopedic devices associated with adverse incidents: Secondary | ICD-10-CM | POA: Diagnosis present

## 2023-09-12 DIAGNOSIS — Z6837 Body mass index (BMI) 37.0-37.9, adult: Secondary | ICD-10-CM

## 2023-09-12 DIAGNOSIS — M25561 Pain in right knee: Secondary | ICD-10-CM | POA: Diagnosis not present

## 2023-09-12 LAB — CBC WITH DIFFERENTIAL/PLATELET
Abs Immature Granulocytes: 0.12 10*3/uL — ABNORMAL HIGH (ref 0.00–0.07)
Basophils Absolute: 0.1 10*3/uL (ref 0.0–0.1)
Basophils Relative: 1 %
Eosinophils Absolute: 0.5 10*3/uL (ref 0.0–0.5)
Eosinophils Relative: 4 %
HCT: 27.1 % — ABNORMAL LOW (ref 36.0–46.0)
Hemoglobin: 9 g/dL — ABNORMAL LOW (ref 12.0–15.0)
Immature Granulocytes: 1 %
Lymphocytes Relative: 14 %
Lymphs Abs: 1.5 10*3/uL (ref 0.7–4.0)
MCH: 35.4 pg — ABNORMAL HIGH (ref 26.0–34.0)
MCHC: 33.2 g/dL (ref 30.0–36.0)
MCV: 106.7 fL — ABNORMAL HIGH (ref 80.0–100.0)
Monocytes Absolute: 0.7 10*3/uL (ref 0.1–1.0)
Monocytes Relative: 7 %
Neutro Abs: 7.8 10*3/uL — ABNORMAL HIGH (ref 1.7–7.7)
Neutrophils Relative %: 73 %
Platelets: 338 10*3/uL (ref 150–400)
RBC: 2.54 MIL/uL — ABNORMAL LOW (ref 3.87–5.11)
RDW: 16.4 % — ABNORMAL HIGH (ref 11.5–15.5)
WBC: 10.7 10*3/uL — ABNORMAL HIGH (ref 4.0–10.5)
nRBC: 0.2 % (ref 0.0–0.2)

## 2023-09-12 LAB — COMPREHENSIVE METABOLIC PANEL WITH GFR
ALT: 13 U/L (ref 0–44)
AST: 19 U/L (ref 15–41)
Albumin: 3.3 g/dL — ABNORMAL LOW (ref 3.5–5.0)
Alkaline Phosphatase: 71 U/L (ref 38–126)
Anion gap: 9 (ref 5–15)
BUN: 20 mg/dL (ref 8–23)
CO2: 29 mmol/L (ref 22–32)
Calcium: 8.9 mg/dL (ref 8.9–10.3)
Chloride: 97 mmol/L — ABNORMAL LOW (ref 98–111)
Creatinine, Ser: 1.21 mg/dL — ABNORMAL HIGH (ref 0.44–1.00)
GFR, Estimated: 46 mL/min — ABNORMAL LOW (ref >60)
Glucose, Bld: 95 mg/dL (ref 70–99)
Potassium: 3.6 mmol/L (ref 3.5–5.1)
Sodium: 135 mmol/L (ref 135–145)
Total Bilirubin: 1.8 mg/dL — ABNORMAL HIGH (ref 0.0–1.2)
Total Protein: 6.3 g/dL — ABNORMAL LOW (ref 6.5–8.1)

## 2023-09-12 LAB — GLUCOSE, CAPILLARY
Glucose-Capillary: 116 mg/dL — ABNORMAL HIGH (ref 70–99)
Glucose-Capillary: 172 mg/dL — ABNORMAL HIGH (ref 70–99)

## 2023-09-12 MED ORDER — INSULIN ASPART 100 UNIT/ML IJ SOLN
0.0000 [IU] | Freq: Three times a day (TID) | INTRAMUSCULAR | Status: DC
  Administered 2023-09-13: 3 [IU] via SUBCUTANEOUS
  Administered 2023-09-13 – 2023-09-14 (×2): 2 [IU] via SUBCUTANEOUS
  Administered 2023-09-14: 3 [IU] via SUBCUTANEOUS
  Administered 2023-09-15: 2 [IU] via SUBCUTANEOUS
  Administered 2023-09-15: 3 [IU] via SUBCUTANEOUS
  Administered 2023-09-16: 2 [IU] via SUBCUTANEOUS

## 2023-09-12 MED ORDER — ONDANSETRON HCL 4 MG/2ML IJ SOLN
4.0000 mg | Freq: Once | INTRAMUSCULAR | Status: AC
  Administered 2023-09-12: 4 mg via INTRAVENOUS
  Filled 2023-09-12: qty 2

## 2023-09-12 MED ORDER — BISACODYL 5 MG PO TBEC: 5.0000 mg | DELAYED_RELEASE_TABLET | Freq: Every day | ORAL | Status: DC | PRN

## 2023-09-12 MED ORDER — IRBESARTAN 150 MG PO TABS
150.0000 mg | ORAL_TABLET | Freq: Every day | ORAL | Status: DC
  Administered 2023-09-12 – 2023-09-15 (×4): 150 mg via ORAL
  Filled 2023-09-12 (×5): qty 1

## 2023-09-12 MED ORDER — MORPHINE SULFATE (PF) 4 MG/ML IV SOLN
4.0000 mg | Freq: Once | INTRAVENOUS | Status: AC
  Administered 2023-09-12: 4 mg via INTRAVENOUS
  Filled 2023-09-12: qty 1

## 2023-09-12 MED ORDER — ONDANSETRON HCL 4 MG PO TABS
4.0000 mg | ORAL_TABLET | Freq: Four times a day (QID) | ORAL | Status: DC | PRN
  Administered 2023-09-13: 4 mg via ORAL
  Filled 2023-09-12: qty 1

## 2023-09-12 MED ORDER — HYDROMORPHONE HCL 2 MG PO TABS
2.0000 mg | ORAL_TABLET | Freq: Once | ORAL | Status: AC
  Administered 2023-09-12: 2 mg via ORAL
  Filled 2023-09-12: qty 1

## 2023-09-12 MED ORDER — LEVOTHYROXINE SODIUM 112 MCG PO TABS
112.0000 ug | ORAL_TABLET | Freq: Every day | ORAL | Status: DC
  Administered 2023-09-13 – 2023-09-16 (×4): 112 ug via ORAL
  Filled 2023-09-12 (×4): qty 1

## 2023-09-12 MED ORDER — RISAQUAD PO CAPS
1.0000 | ORAL_CAPSULE | Freq: Every morning | ORAL | Status: DC
  Administered 2023-09-13 – 2023-09-16 (×4): 1 via ORAL
  Filled 2023-09-12 (×5): qty 1

## 2023-09-12 MED ORDER — POLYVINYL ALCOHOL 1.4 % OP SOLN: 1.0000 [drp] | Freq: Three times a day (TID) | OPHTHALMIC | Status: DC | PRN

## 2023-09-12 MED ORDER — HYDROCHLOROTHIAZIDE 25 MG PO TABS
25.0000 mg | ORAL_TABLET | Freq: Every morning | ORAL | Status: DC
  Administered 2023-09-13 – 2023-09-16 (×4): 25 mg via ORAL
  Filled 2023-09-12 (×4): qty 1

## 2023-09-12 MED ORDER — TRAZODONE HCL 50 MG PO TABS
25.0000 mg | ORAL_TABLET | Freq: Every evening | ORAL | Status: DC | PRN
  Administered 2023-09-12: 25 mg via ORAL
  Filled 2023-09-12: qty 1

## 2023-09-12 MED ORDER — HYDRALAZINE HCL 25 MG PO TABS
25.0000 mg | ORAL_TABLET | Freq: Three times a day (TID) | ORAL | Status: DC
  Administered 2023-09-12 – 2023-09-13 (×3): 25 mg via ORAL
  Filled 2023-09-12 (×3): qty 1

## 2023-09-12 MED ORDER — INSULIN ASPART PROT & ASPART (70-30 MIX) 100 UNIT/ML ~~LOC~~ SUSP
45.0000 [IU] | Freq: Two times a day (BID) | SUBCUTANEOUS | Status: DC
  Administered 2023-09-13 – 2023-09-14 (×4): 45 [IU] via SUBCUTANEOUS
  Filled 2023-09-12: qty 10

## 2023-09-12 MED ORDER — CITALOPRAM HYDROBROMIDE 20 MG PO TABS
10.0000 mg | ORAL_TABLET | ORAL | Status: DC
  Administered 2023-09-13 – 2023-09-16 (×2): 10 mg via ORAL
  Filled 2023-09-12 (×2): qty 1

## 2023-09-12 MED ORDER — POLYETHYL GLYCOL-PROPYL GLYCOL 0.4-0.3 % OP SOLN: 1.0000 [drp] | Freq: Three times a day (TID) | OPHTHALMIC | Status: DC | PRN

## 2023-09-12 MED ORDER — FENTANYL CITRATE PF 50 MCG/ML IJ SOSY
50.0000 ug | PREFILLED_SYRINGE | INTRAMUSCULAR | Status: DC | PRN
  Administered 2023-09-12 – 2023-09-14 (×4): 50 ug via INTRAVENOUS
  Filled 2023-09-12 (×4): qty 1

## 2023-09-12 MED ORDER — INSULIN ASPART 100 UNIT/ML IJ SOLN: 0.0000 [IU] | Freq: Every day | INTRAMUSCULAR | Status: DC

## 2023-09-12 MED ORDER — HYDROCHLOROTHIAZIDE 25 MG PO TABS
25.0000 mg | ORAL_TABLET | Freq: Once | ORAL | Status: AC
  Administered 2023-09-12: 25 mg via ORAL
  Filled 2023-09-12: qty 1

## 2023-09-12 MED ORDER — FENTANYL CITRATE PF 50 MCG/ML IJ SOSY: 25.0000 ug | PREFILLED_SYRINGE | INTRAMUSCULAR | Status: DC | PRN

## 2023-09-12 MED ORDER — OXYCODONE HCL 5 MG PO TABS
5.0000 mg | ORAL_TABLET | Freq: Four times a day (QID) | ORAL | Status: DC | PRN
  Administered 2023-09-13 – 2023-09-14 (×2): 5 mg via ORAL
  Filled 2023-09-12: qty 2
  Filled 2023-09-12 (×2): qty 1

## 2023-09-12 MED ORDER — AMLODIPINE BESYLATE 5 MG PO TABS
5.0000 mg | ORAL_TABLET | Freq: Every day | ORAL | Status: DC
  Administered 2023-09-12 – 2023-09-13 (×2): 5 mg via ORAL
  Filled 2023-09-12 (×2): qty 1

## 2023-09-12 MED ORDER — ACETAMINOPHEN 500 MG PO TABS
1000.0000 mg | ORAL_TABLET | Freq: Four times a day (QID) | ORAL | Status: DC
  Administered 2023-09-12 – 2023-09-16 (×15): 1000 mg via ORAL
  Filled 2023-09-12 (×14): qty 2

## 2023-09-12 MED ORDER — DOCUSATE SODIUM 100 MG PO CAPS
100.0000 mg | ORAL_CAPSULE | Freq: Two times a day (BID) | ORAL | Status: DC
  Administered 2023-09-12 – 2023-09-16 (×8): 100 mg via ORAL
  Filled 2023-09-12 (×8): qty 1

## 2023-09-12 MED ORDER — LORATADINE 10 MG PO TABS
10.0000 mg | ORAL_TABLET | Freq: Every morning | ORAL | Status: DC
  Administered 2023-09-13 – 2023-09-16 (×4): 10 mg via ORAL
  Filled 2023-09-12 (×4): qty 1

## 2023-09-12 MED ORDER — METHOCARBAMOL 1000 MG/10ML IJ SOLN: 500.0000 mg | Freq: Four times a day (QID) | INTRAMUSCULAR | Status: DC | PRN

## 2023-09-12 MED ORDER — APIXABAN 5 MG PO TABS
5.0000 mg | ORAL_TABLET | Freq: Two times a day (BID) | ORAL | Status: DC
  Administered 2023-09-12 – 2023-09-16 (×9): 5 mg via ORAL
  Filled 2023-09-12 (×8): qty 1
  Filled 2023-09-12: qty 2

## 2023-09-12 MED ORDER — ONDANSETRON HCL 4 MG/2ML IJ SOLN
4.0000 mg | Freq: Four times a day (QID) | INTRAMUSCULAR | Status: DC | PRN
  Administered 2023-09-13 (×2): 4 mg via INTRAVENOUS
  Filled 2023-09-12 (×2): qty 2

## 2023-09-12 MED ORDER — HYDRALAZINE HCL 20 MG/ML IJ SOLN: 10.0000 mg | INTRAMUSCULAR | Status: DC | PRN

## 2023-09-12 NOTE — TOC Initial Note (Signed)
 Transition of Care [facility]) - Initial/Assessment Note    Patient Details  Name: Alicia [name] MRN: [id number] Date of Birth: June 13, 1945  Transition of Care [facility]) CM/SW Contact:    Cyndie Dredge, LCSWA Phone Number: 09/12/2023, 1:19 PM  Clinical Narrative:                  Patient is currently in the ED , but will be admitted under Observation . CSW reviewed patient chart and see that patient has a LG - daughter name Alicia [name] . Spoke with daughter Alicia [name] who states that she is not technically patient LG ,able to answer CSW questions. Patient lives alone, recently had knee surgery, and been experiencing high BP and sugar, which is reason for coming to ED. Patient has walker, rollator, and canes at home. SNF placement consult has been placed, daughter aware . Patient been having OOPT at [facility]  from Feb up until her surgery; then eventually had PT in the home. CSW shared with daughter that [facility] will follow for PT recommendation. Alicia [name] agreeable to SNF, if recommended , but she expressed that patient would not like it. TOC to follow.   Barriers to Discharge: Continued Medical Work up   Patient Goals and CMS Choice Patient states their goals for this hospitalization and ongoing recovery are:: return back home CMS Medicare.gov Compare Post Acute Care list provided to:: Patient Represenative (must comment) (Daughter-Michelle)        Expected Discharge Plan and Services In-house Referral: Clinical Social Work Discharge Planning Services: CM Consult Post Acute Care Choice: Durable Medical Equipment Living arrangements for the past 2 months: Single Family Home                             HH Agency: Other - See comment (Patient was doing OPPT at [facility] until they started coming into the home)        Prior Living Arrangements/Services Living arrangements for the past 2 months: Single Family Home Lives with:: Self Patient language and need for interpreter reviewed::  Yes Do you feel safe going back to the place where you live?: Yes      Need for Family Participation in Patient Care: Yes (Comment) Care giver support system in place?: No (comment) Current home services: Home PT Criminal Activity/Legal Involvement Pertinent to Current Situation/Hospitalization: No - Comment as needed  Activities of Daily Living      Permission Sought/Granted      Share Information with NAME: Alicia [name]     Permission granted to share info w Relationship: Daughter     Emotional Assessment       Orientation: : Oriented to Self, Oriented to Place, Oriented to  Time, Oriented to Situation Alcohol  / Substance Use: Not Applicable Psych Involvement: No (comment)  Admission diagnosis:  Closed fracture of medial condyle of right femur (HCC) [O13.086V] Patient Active Problem List   Diagnosis Date Noted   Closed fracture of medial condyle of right femur (HCC) 09/12/2023   Uncontrolled pain 09/12/2023   Primary osteoarthritis of right knee 09/02/2023   Diarrhea 01/10/2023   Other fatigue 01/10/2023   Genetic testing 07/04/2017   Osteoarthritis of knee 06/11/2017   Family history of cancer    Family history of breast cancer    Endometrial cancer (HCC) 02/19/2017   Endometrial ca (HCC) 01/16/2017   Cervical polyp 12/12/2016   PMB (postmenopausal bleeding) 12/12/2016   Dermatitis 09/19/2015   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 - [place], [place] - [address] [place] [address] Phone: [phone] Fax: [phone]     Social Drivers of  Health (SDOH) Social History: SDOH Screenings   Food Insecurity: No Food Insecurity (09/02/2023)  Housing: Low Risk  (09/02/2023)  Transportation Needs: No Transportation Needs (09/02/2023)  Utilities: Not At Risk (09/02/2023)  Social Connections: Moderately Isolated (09/02/2023)  Tobacco Use: Low Risk  (09/12/2023)   SDOH Interventions:     Readmission Risk Interventions     No data to display

## 2023-09-12 NOTE — Care Plan (Signed)
 Orthopaedic Surgery Plan of Care Note   -history and imaging reviewed with primary team (ER) -pt has non displaced medial condyle fx periprosthetic in setting of recent right TKA (DOS 09/02/23) -reviewed with Dr. France Ina her surgeon, plan is for non-op, knee immobilizer, TDWB with walker and 1-2 person assist -admit to hospitalist team for pain control if indicated -pt to follow up outpatient next week with Dr. France Ina for further discussion   Alicia Ink, MD Orthopaedic Surgery [facility]

## 2023-09-12 NOTE — ED Notes (Signed)
 Attempted to call report

## 2023-09-12 NOTE — Care Management Obs Status (Signed)
 MEDICARE OBSERVATION STATUS NOTIFICATION   Patient Details  Name: Alicia [name] MRN: [id number] Date of Birth: [birth year]   Medicare Observation Status Notification Given:  Yes    Geraldina Klinefelter, RN 09/12/2023, 8:51 PM

## 2023-09-12 NOTE — ED Notes (Signed)
 olmesartan Given

## 2023-09-12 NOTE — ED Triage Notes (Signed)
 Pt c/o of high BP x 3 days. Saw PCP and was placed on an additional BP meds. No change in blood pressure.   Recent R knee surgery on 4/28. Increased swelling in yesterday and concern for blood clot

## 2023-09-12 NOTE — Hospital Course (Addendum)
 78 year old female with history of type 2 diabetes mellitus, hypothyroidism, atrial fibrillation, second-degree Mobitz 2 AV block with pacemaker, hypertension, functional disability status post total right knee replacement 09/02/2023 by Dr. France Ina was discharged on 09/03/23.  She had been working with outpatient PT and home health PT.  She has been having very high blood pressures postoperatively.  Her PCP started her on additional blood pressure medication couple of days ago.  Reports that she has had increased swelling in he right knee and leg since 09/11/2023 and was concerned about a blood clot.  She reports that she had been ambulating by the touchdown weightbearing with PT up until about 3 days ago when the swelling and pain became so severe she has not been able to ambulate.  She was advised by orthopedics to come to the ED to rule out a blood clot.  She has been taking tramadol  at home for pain.  DVT ultrasound was negative.  She had x-ray imaging and a CT scan that was demonstrating a nondisplaced distal medial condyle femoral fracture on the right.  This was discussed with [facility] who recommended that this is nonoperative and recommended right knee immobilization with a mobilization recommendation for touchdown weightbearing with a walker.  She is being admitted for pain control as she is having difficulty ambulating and pain has been uncontrolled in addition to elevated blood pressures.  She is being admitted for further management.

## 2023-09-12 NOTE — Plan of Care (Signed)
 Problem: Education: Goal: Knowledge of General Education information will improve Description: Including pain rating scale, medication(s)/side effects and non-pharmacologic comfort measures 09/12/2023 1501 by Goldie Later, RN Outcome: Progressing 09/12/2023 1348 by Goldie Later, RN Outcome: Progressing   Problem: Health Behavior/Discharge Planning: Goal: Ability to manage health-related needs will improve 09/12/2023 1501 by Goldie Later, RN Outcome: Progressing 09/12/2023 1348 by Goldie Later, RN Outcome: Progressing   Problem: Clinical Measurements: Goal: Ability to maintain clinical measurements within normal limits will improve 09/12/2023 1501 by Goldie Later, RN Outcome: Progressing 09/12/2023 1348 by Goldie Later, RN Outcome: Progressing Goal: Will remain free from infection 09/12/2023 1501 by Goldie Later, RN Outcome: Progressing 09/12/2023 1348 by Goldie Later, RN Outcome: Progressing Goal: Diagnostic test results will improve 09/12/2023 1501 by Goldie Later, RN Outcome: Progressing 09/12/2023 1348 by Goldie Later, RN Outcome: Progressing Goal: Respiratory complications will improve 09/12/2023 1501 by Goldie Later, RN Outcome: Progressing 09/12/2023 1348 by Goldie Later, RN Outcome: Progressing Goal: Cardiovascular complication will be avoided 09/12/2023 1501 by Goldie Later, RN Outcome: Progressing 09/12/2023 1348 by Goldie Later, RN Outcome: Progressing   Problem: Activity: Goal: Risk for activity intolerance will decrease 09/12/2023 1501 by Goldie Later, RN Outcome: Progressing 09/12/2023 1348 by Goldie Later, RN Outcome: Progressing   Problem: Nutrition: Goal: Adequate nutrition will be maintained 09/12/2023 1501 by Goldie Later, RN Outcome: Progressing 09/12/2023 1348 by Goldie Later, RN Outcome: Progressing   Problem: Coping: Goal: Level of anxiety will decrease 09/12/2023 1501 by Goldie Later, RN Outcome: Progressing 09/12/2023 1348 by Goldie Later,  RN Outcome: Progressing   Problem: Elimination: Goal: Will not experience complications related to bowel motility 09/12/2023 1501 by Goldie Later, RN Outcome: Progressing 09/12/2023 1348 by Goldie Later, RN Outcome: Progressing Goal: Will not experience complications related to urinary retention 09/12/2023 1501 by Goldie Later, RN Outcome: Progressing 09/12/2023 1348 by Goldie Later, RN Outcome: Progressing   Problem: Pain Managment: Goal: General experience of comfort will improve and/or be controlled 09/12/2023 1501 by Goldie Later, RN Outcome: Progressing 09/12/2023 1348 by Goldie Later, RN Outcome: Progressing   Problem: Safety: Goal: Ability to remain free from injury will improve 09/12/2023 1501 by Goldie Later, RN Outcome: Progressing 09/12/2023 1348 by Goldie Later, RN Outcome: Progressing   Problem: Skin Integrity: Goal: Risk for impaired skin integrity will decrease 09/12/2023 1501 by Goldie Later, RN Outcome: Progressing 09/12/2023 1348 by Goldie Later, RN Outcome: Progressing   Problem: Education: Goal: Ability to describe self-care measures that may prevent or decrease complications (Diabetes Survival Skills Education) will improve 09/12/2023 1501 by Goldie Later, RN Outcome: Progressing 09/12/2023 1348 by Goldie Later, RN Outcome: Progressing Goal: Individualized Educational Video(s) 09/12/2023 1501 by Goldie Later, RN Outcome: Progressing 09/12/2023 1348 by Goldie Later, RN Outcome: Progressing   Problem: Coping: Goal: Ability to adjust to condition or change in health will improve 09/12/2023 1501 by Goldie Later, RN Outcome: Progressing 09/12/2023 1348 by Goldie Later, RN Outcome: Progressing   Problem: Fluid Volume: Goal: Ability to maintain a balanced intake and output will improve 09/12/2023 1501 by Goldie Later, RN Outcome: Progressing 09/12/2023 1348 by Goldie Later, RN Outcome: Progressing   Problem: Health Behavior/Discharge Planning: Goal: Ability  to identify and utilize available resources and services will improve 09/12/2023 1501 by Goldie Later, RN Outcome: Progressing 09/12/2023 1348 by  Nizar Cutler, Leverne Reading, RN Outcome: Progressing Goal: Ability to manage health-related needs will improve 09/12/2023 1501 by Goldie Later, RN Outcome: Progressing 09/12/2023 1348 by Goldie Later, RN Outcome: Progressing   Problem: Metabolic: Goal: Ability to maintain appropriate glucose levels will improve 09/12/2023 1501 by Goldie Later, RN Outcome: Progressing 09/12/2023 1348 by Goldie Later, RN Outcome: Progressing   Problem: Nutritional: Goal: Maintenance of adequate nutrition will improve 09/12/2023 1501 by Goldie Later, RN Outcome: Progressing 09/12/2023 1348 by Goldie Later, RN Outcome: Progressing Goal: Progress toward achieving an optimal weight will improve 09/12/2023 1501 by Goldie Later, RN Outcome: Progressing 09/12/2023 1348 by Goldie Later, RN Outcome: Progressing   Problem: Skin Integrity: Goal: Risk for impaired skin integrity will decrease 09/12/2023 1501 by Goldie Later, RN Outcome: Progressing 09/12/2023 1348 by Goldie Later, RN Outcome: Progressing   Problem: Tissue Perfusion: Goal: Adequacy of tissue perfusion will improve 09/12/2023 1501 by Goldie Later, RN Outcome: Progressing 09/12/2023 1348 by Goldie Later, RN Outcome: Progressing

## 2023-09-12 NOTE — Plan of Care (Signed)

## 2023-09-12 NOTE — ED Provider Notes (Signed)
 [place] EMERGENCY DEPARTMENT AT [facility] Provider Note   CSN: [id number] Arrival date & time: 09/12/23  4098     History  Chief Complaint  Patient presents with   Hypertension    Alicia [name] is a 78 y.o. female.  HPI 78 year old female presents with hypertension and leg swelling.  Patient has been dealing with high blood pressure for a while and when she had knee surgery a little over a week ago she had significant hypertension in the hospital.  She is chronically on HCTZ and her doctor added olmesartan, she took her first dose yesterday evening.  Patient states this morning when her therapist checked on her, her blood pressure was in the 250 systolic range.  She denies any headache, vision changes, chest pain, shortness of breath.  The leg swelling in her right leg has been present ever since she had surgery.  Her knee is swollen but no erythema or drainage.  Symptoms seem to be stable but the Ortho PA was recommending she get evaluated for a blood clot.  Left ankle is chronically swollen but this is unchanged from baseline.  Patient endorses some nausea but states that is primarily whenever she has pain trying to walk due to her knee.  Took some tramadol  around 7 AM.  Home Medications Prior to Admission medications   Medication Sig Start Date End Date Taking? Authorizing Provider  acetaminophen  (TYLENOL ) 650 MG CR tablet Take 1,300 mg by mouth every 6 (six) hours as needed for pain.   Yes [provider]  apixaban  (ELIQUIS ) 5 MG TABS tablet Take 1 tablet (5 mg total) by mouth 2 (two) times daily. 04/22/20  Yes Allred, Royston Cornea, MD  glipiZIDE  (GLUCOTROL  XL) 2.5 MG 24 hr tablet Take 2.5 mg by mouth daily with breakfast.   Yes [provider]  HUMULIN  70/30 (70-30) 100 UNIT/ML injection Inject 50 Units into the skin 2 (two) times daily with a meal. 06/25/23  Yes [provider]  hydrochlorothiazide  (HYDRODIURIL ) 25 MG tablet Take 25 mg by mouth in the  morning. 08/13/16  Yes [provider]  levothyroxine  (SYNTHROID ) 112 MCG tablet TAKE 1 TABLET BY MOUTH ONCE DAILY BEFORE BREAKFAST 08/03/21  Yes [name], MD  loratadine (CLARITIN) 10 MG tablet Take 10 mg by mouth in the morning.   Yes [provider]  Misc Natural Products (IMMUNE FORMULA PO) Take 1 capsule by mouth in the morning and at bedtime. ESSIAC Herbal Extract Capsules Powerful Antioxidant Blend   Yes [provider]  olmesartan (BENICAR) 20 MG tablet Take 20 mg by mouth daily. 09/11/23  Yes [provider]  ondansetron  (ZOFRAN ) 4 MG tablet Take 1 tablet (4 mg total) by mouth every 6 (six) hours as needed for nausea. 09/03/23  Yes Edmisten, Onesimo Bijou, PA  Polyethyl Glycol-Propyl Glycol (SYSTANE) 0.4-0.3 % SOLN Place 1-2 drops into both eyes 3 (three) times daily as needed (dry/irritated eyes.).   Yes [provider]  Probiotic Product (PROBIOTIC DAILY PO) Take 1 capsule by mouth in the morning.   Yes [provider]  traMADol  (ULTRAM ) 50 MG tablet Take 50-100 mg by mouth every 6 (six) hours as needed for moderate pain (pain score 4-6). 09/09/23  Yes [provider]  citalopram  (CELEXA ) 10 MG tablet Take 10 mg by mouth every Monday, Wednesday, and Friday. 09/25/22   [provider]  GLOBAL INJECT EASE INSULIN  SYR 31G X 5/16" 1 ML MISC Inject into the skin. 02/23/23  [provider]  INSULIN  SYRINGE 1CC/29G 29G X 1/2" 1 ML MISC  10/05/14   [provider]  Multiple Vitamins-Minerals (LUTEIN-ZEAXANTHIN PO) Take 1 tablet by mouth at bedtime.    [provider]  oxyCODONE  (OXY IR/ROXICODONE ) 5 MG immediate release tablet Take 1-2 tablets (5-10 mg total) by mouth every 6 (six) hours as needed for severe pain (pain score 7-10). Patient not taking: Reported on 09/12/2023 09/03/23   Dolphus Friday, PA      Allergies    Ace inhibitors, Cardizem  [diltiazem  hcl], Misc. sulfonamide containing compounds,  Oxycodone , Statins, Sulfa antibiotics, Tape, Aspirin , Cortisone, Fd&c yellow #5 (tartrazine), and Latex    Review of Systems   Review of Systems  Constitutional:  Negative for fever.  Eyes:  Negative for visual disturbance.  Respiratory:  Negative for shortness of breath.   Cardiovascular:  Positive for leg swelling. Negative for chest pain.  Gastrointestinal:  Positive for nausea. Negative for abdominal pain and vomiting.  Musculoskeletal:  Positive for joint swelling.  Neurological:  Negative for headaches.    Physical Exam Updated Vital Signs BP (!) 201/56   Pulse 60   Temp 98.3 F (36.8 C) (Oral)   Resp 16   Ht 5' 5.5" (1.664 m)   Wt 103.4 kg   SpO2 96%   BMI 37.36 kg/m  Physical Exam Vitals and nursing note reviewed.  Constitutional:      General: She is not in acute distress.    Appearance: She is well-developed. She is not ill-appearing or diaphoretic.  HENT:     Head: Normocephalic and atraumatic.  Cardiovascular:     Rate and Rhythm: Normal rate and regular rhythm.     Pulses:          Dorsalis pedis pulses are 2+ on the right side.     Heart sounds: Normal heart sounds.  Pulmonary:     Effort: Pulmonary effort is normal.     Breath sounds: Normal breath sounds.  Abdominal:     Palpations: Abdomen is soft.     Tenderness: There is no abdominal tenderness.  Musculoskeletal:     Comments: There is moderate left ankle and foot swelling.  Both legs are initially in TED hose.  Right lower extremity is diffusely swollen a little more than the left.  There is also some right knee anterior swelling and ecchymosis but the incision is clean, dry, intact without obvious infection.  No cellulitis to the lower extremity.  Skin:    General: Skin is warm and dry.  Neurological:     Mental Status: She is alert.     ED Results / Procedures / Treatments   Labs (all labs ordered are listed, but only abnormal results are displayed) Labs Reviewed  COMPREHENSIVE METABOLIC  PANEL WITH GFR - Abnormal; Notable for the following components:      Result Value   Chloride 97 (*)    Creatinine, Ser 1.21 (*)    Total Protein 6.3 (*)    Albumin 3.3 (*)    Total Bilirubin 1.8 (*)    GFR, Estimated 46 (*)    All other components within normal limits  CBC WITH DIFFERENTIAL/PLATELET - Abnormal; Notable for the following components:   WBC 10.7 (*)    RBC 2.54 (*)    Hemoglobin 9.0 (*)    HCT 27.1 (*)    MCV 106.7 (*)    MCH 35.4 (*)    RDW 16.4 (*)    Neutro Abs 7.8 (*)  Abs Immature Granulocytes 0.12 (*)    All other components within normal limits    EKG EKG Interpretation Date/Time:  Thursday Sep 12 2023 09:15:32 EDT Ventricular Rate:  60 PR Interval:  148 QRS Duration:  111 QT Interval:  443 QTC Calculation: 443 R Axis:   1  Text Interpretation: Sinus rhythm Abnormal R-wave progression, early transition Left ventricular hypertrophy no significant change since Jan 2025 Confirmed by Jerilynn Montenegro [phone]) on 09/12/2023 9:24:34 AM  Radiology CT Knee Right Wo Contrast Result Date: 09/12/2023 CLINICAL DATA:  Knee trauma, distal femur fracture. 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 EXAM: CT OF THE RIGHT KNEE WITHOUT CONTRAST TECHNIQUE: Multidetector CT imaging of the right knee was performed according to the standard protocol. Multiplanar CT image reconstructions were also generated. RADIATION DOSE REDUCTION: This exam was performed according to the departmental dose-optimization program which includes automated exposure control, adjustment of the mA and/or kV according to patient size and/or use of iterative reconstruction technique. COMPARISON:  Radiographs 09/12/2023 FINDINGS: Bones/Joint/Cartilage Sagittally oriented acute fracture observed extending from the medial distal metaphysis into the medial femoral epicondyle and medial femoral condyle along the medial margin of the femoral component of the knee prosthesis. No patellar or proximal  tibial or fibular fracture. Trace amount of gas in the joint and in the marrow of the distal femur, considered postoperative. Large knee hemarthrosis. Ligaments Suboptimally assessed by CT. Muscles and Tendons Low-level edema tracks between the medial head gastrocnemius and the soleus muscle, probably incidental although a plantaris tear can cause a similar appearance. Soft tissues Atherosclerosis.  Subcutaneous edema along the distal thigh. IMPRESSION: 1. Sagittally oriented acute fracture extending from the medial distal metaphysis into the medial femoral epicondyle and medial femoral condyle along the medial margin of the femoral component of the knee prosthesis. 2. Large knee hemarthrosis. 3. Low-level edema tracks between the medial head gastrocnemius and the soleus muscle, probably incidental although a plantaris tear can cause a similar appearance. 4. Atherosclerosis. 5. Subcutaneous edema along the distal thigh. Electronically Signed   By: Freida Jes M.D.   On: 09/12/2023 13:32   DG Knee Complete 4 Views Right Result Date: 09/12/2023 CLINICAL DATA:  Right knee pain. EXAM: RIGHT KNEE - COMPLETE 4+ VIEW COMPARISON:  None Available. FINDINGS: There is a nondisplaced fracture of the medial distal femur just above the medial epicondyle. Evaluation of the fracture is limited due to osteopenia. No other acute fracture. There is no dislocation. Total right knee arthroplasty appears intact and in anatomic alignment. There is a moderate joint effusion. Small radiolucent pockets inferior to the patella may represent air or fat lobules. There is diffuse subcutaneous edema. IMPRESSION: 1. Nondisplaced fracture of the medial distal femur. 2. Moderate joint effusion. Electronically Signed   By: Angus Bark M.D.   On: 09/12/2023 11:22   US  Venous Img Lower Unilateral Right Result Date: 09/12/2023 CLINICAL DATA:  Right leg swelling EXAM: Right LOWER EXTREMITY VENOUS DOPPLER ULTRASOUND TECHNIQUE: Gray-scale  sonography with compression, as well as color and duplex ultrasound, were performed to evaluate the deep venous system(s) from the level of the common femoral vein through the popliteal and proximal calf veins. COMPARISON:  None Available. FINDINGS: VENOUS Normal compressibility of the common femoral, superficial femoral, and popliteal veins, as well as the visualized calf veins. Visualized portions of profunda femoral vein and great saphenous vein unremarkable. No filling defects to suggest DVT on grayscale or color Doppler imaging. Doppler waveforms show normal direction of venous flow,  normal respiratory plasticity and response to augmentation. Limited views of the contralateral common femoral vein are unremarkable. OTHER None. Limitations: none IMPRESSION: 1. No evidence of right lower extremity DVT. Electronically Signed   By: Reagan Camera M.D.   On: 09/12/2023 09:47    Procedures Procedures    Medications Ordered in ED Medications  hydrochlorothiazide  (HYDRODIURIL ) tablet 25 mg (has no administration in time range)  irbesartan (AVAPRO) tablet 150 mg (150 mg Oral Given 09/12/23 1428)  citalopram  (CELEXA ) tablet 10 mg (has no administration in time range)  insulin  aspart protamine- aspart (NOVOLOG  MIX 70/30) injection 45 Units (has no administration in time range)  levothyroxine  (SYNTHROID ) tablet 112 mcg (has no administration in time range)  acidophilus (RISAQUAD) capsule 1 capsule (has no administration in time range)  apixaban  (ELIQUIS ) tablet 5 mg (5 mg Oral Given 09/12/23 1427)  loratadine (CLARITIN) tablet 10 mg (has no administration in time range)  insulin  aspart (novoLOG ) injection 0-15 Units (has no administration in time range)  insulin  aspart (novoLOG ) injection 0-5 Units (has no administration in time range)  acetaminophen  (TYLENOL ) tablet 1,000 mg (1,000 mg Oral Given 09/12/23 1428)  oxyCODONE  (Oxy IR/ROXICODONE ) immediate release tablet 5-7.5 mg (has no administration in time range)   fentaNYL  (SUBLIMAZE ) injection 25 mcg (has no administration in time range)  methocarbamol  (ROBAXIN ) injection 500 mg (has no administration in time range)  traZODone (DESYREL) tablet 25 mg (has no administration in time range)  docusate sodium  (COLACE) capsule 100 mg (has no administration in time range)  bisacodyl  (DULCOLAX) EC tablet 5 mg (has no administration in time range)  ondansetron  (ZOFRAN ) tablet 4 mg (has no administration in time range)    Or  ondansetron  (ZOFRAN ) injection 4 mg (has no administration in time range)  hydrALAZINE (APRESOLINE) injection 10 mg (has no administration in time range)  amLODipine (NORVASC) tablet 5 mg (5 mg Oral Given 09/12/23 1427)  hydrALAZINE (APRESOLINE) tablet 25 mg (25 mg Oral Given 09/12/23 1428)  polyvinyl alcohol  (LIQUIFILM TEARS) 1.4 % ophthalmic solution 1 drop (has no administration in time range)  hydrochlorothiazide  (HYDRODIURIL ) tablet 25 mg (25 mg Oral Given 09/12/23 0904)  morphine (PF) 4 MG/ML injection 4 mg (4 mg Intravenous Given 09/12/23 0906)  ondansetron  (ZOFRAN ) injection 4 mg (4 mg Intravenous Given 09/12/23 0906)  HYDROmorphone  (DILAUDID ) tablet 2 mg (2 mg Oral Given 09/12/23 1030)    ED Course/ Medical Decision Making/ A&P Clinical Course as of 09/12/23 1455  Thu Sep 12, 2023  1207 I discussed with orthopedics, Dr. Cherl Corner, who is asking for CT of the knee and then will provide further recommendations after that for final plan. [SG]    Clinical Course User Index [SG] Jerilynn Montenegro, MD                                 Medical Decision Making Amount and/or Complexity of Data Reviewed Labs: ordered.    Details: No kidney injury Radiology: ordered and independent interpretation performed.    Details: Distal femur fracture ECG/medicine tests: ordered and independent interpretation performed.    Details: No ischemia  Risk Prescription drug management. Decision regarding hospitalization.   DVT ultrasound is negative.   However patient has been having such pain with bearing weight and nearly passing out that an x-ray was obtained and does show a nondisplaced distal femur fracture.  No obvious trauma to cause this.  As above I discussed with Ortho and they discussed  that this will ultimately be nonoperative and she can use a knee immobilizer with touchdown weightbearing with a walker.  She does not need to be transferred to [facility] per orthopedics so they would like a CT of the knee.  She also has uncontrolled hypertension but this seems to be baseline and while it is worse today there are no signs or symptoms of endorgan damage.  She was given some p.o. meds and has had some mild to moderate improvement in her hypertension.  Ultimately she will need admission for pain control.  Discussed with Dr. Lincoln Renshaw.        Final Clinical Impression(s) / ED Diagnoses Final diagnoses:  Closed fracture of distal end of right femur, unspecified fracture morphology, initial encounter (HCC)  Uncontrolled hypertension    Rx / DC Orders ED Discharge Orders     None         Jerilynn Montenegro, MD 09/12/23 1456

## 2023-09-12 NOTE — ED Notes (Addendum)
 Patient has swelling to right knee post op right knee replacement on 09/02/2023 with large bruising noted to the back of knee. Patient is also complaining of pain on a scale of 10/10. EDP at bedside. Patient states constant nausea but no SOB.

## 2023-09-12 NOTE — H&P (Signed)
 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 Rossie Coon was discharged on 09/03/23.  She had been working with outpatient PT and home health PT.  She has been having very high blood pressures postoperatively.  Her PCP started her on additional blood pressure medication couple of days ago.  Reports that she has had increased swelling in he right knee and leg since 09/11/2023 and was concerned about a blood clot.  She reports that she had been ambulating by the touchdown weightbearing with PT up until about 3 days ago when the swelling and pain became so severe she has not been able to ambulate.  She was advised by orthopedics to come to the ED to rule out a blood clot.  She has been taking tramadol  at home for pain.  DVT ultrasound was negative.  She had x-ray imaging and a CT scan that was demonstrating a nondisplaced distal medial condyle femoral fracture on the right.  This was discussed with [facility] who recommended that this is nonoperative and recommended right knee immobilization with a mobilization recommendation for touchdown weightbearing with a walker.  She is being admitted for pain control as she is having difficulty ambulating and pain has been uncontrolled in addition to elevated blood pressures.  She is being admitted for further management.   Past Medical History:  Diagnosis Date   Anemia    hx of   Anxiety    Cancer (HCC)    endometrial  cancer/ skin cancers removed nose non melanoma   Diabetes (HCC)    Type 2   DJD (degenerative joint disease)    Dysrhythmia    SYMPTOMATIC BRADYCARDIA  / A. FIB   Family history of breast cancer    Family history of cancer    Gallstones    History of radiation therapy 04/04/17-04/25/17   vaginal cuff 30 Gy in 5 fractions   Hypertension    HCTZ   Hypothyroidism    Obesity    Pacemaker 12/15/2012   DUAL CHAMBER    DR Carolynne Citron   Sleep apnea    No machine Cant tolerate   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   Wears glasses     Past Surgical History:  Procedure Laterality Date   ANKLE SURGERY Right    multiple   BIOPSY  03/11/2023   Procedure: BIOPSY;  Surgeon: Vinetta Greening, DO;  [facility];  Service: Endoscopy;;   BREAST SURGERY     biopsy L breast benign   CHOLECYSTECTOMY     Laparoscopic but converted to open  d/t large stones   COLONOSCOPY     COLONOSCOPY WITH PROPOFOL  N/A 03/11/2023   Procedure: COLONOSCOPY WITH PROPOFOL ;  Surgeon: Vinetta Greening, DO;  [facility];  Service: Endoscopy;  Laterality: N/A;  915am, asa 3   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    EYE SURGERY     both cataracts   FOOT ARTHROTOMY     left   FOOT SURGERY  Bil  bone spurs /Left  foot neuroma removed Right foot sinus tarside surgery Right bone in toe removed   KNEE SURGERY     right   PACEMAKER INSERTION  12/15/2012   SJM 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 by Dr Carolynne Citron for [facility] 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   PERMANENT PACEMAKER INSERTION N/A 12/15/2012   SJM 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 by Dr Carolynne Citron for transient AV block   POLYPECTOMY  03/11/2023   Procedure: POLYPECTOMY;  Surgeon: Vinetta Greening, DO;  [facility];  Service: Endoscopy;;   ROBOTIC ASSISTED TOTAL HYSTERECTOMY WITH BILATERAL SALPINGO OOPHERECTOMY N/A 02/19/2017   Procedure: XI ROBOTIC ASSISTED TOTAL HYSTERECTOMY WITH BILATERAL SALPINGO OOPHORECTOMY;  Surgeon: Alphonso Aschoff, MD;   Location: [facility];  Service: Gynecology;  Laterality: N/A;   SENTINEL NODE BIOPSY N/A 02/19/2017   Procedure: SENTINEL NODE BIOPSY;  Surgeon: Alphonso Aschoff, MD;  Location: [facility];  Service: Gynecology;  Laterality: N/A;   SHOULDER ARTHROSCOPY WITH SUBACROMIAL DECOMPRESSION AND OPEN ROTATOR C Right 07/07/2013   Procedure: RIGHT SHOULDER ARTHROSCOPY WITH SUBACROMIAL DECOMPRESSION AND ROTATOR CUFF DEBRIDEMENT/ DISTAL CLAVICLE RESECTION;  Surgeon: Amelie Baize., MD;  Location: [facility];  Service: Orthopedics;  Laterality: Right;   SHOULDER SURGERY Right 2012   done in [place]   TOTAL KNEE ARTHROPLASTY Right 09/02/2023   Procedure: ARTHROPLASTY, KNEE, TOTAL;  Surgeon: Liliane Rei, MD;  Location: [facility];  Service: Orthopedics;  Laterality: Right;     reports that she has never smoked. She has never used smokeless tobacco. She reports that she does not drink alcohol  and does not use drugs.  Allergies  Allergen Reactions   Ace Inhibitors Cough   Cardizem  [Diltiazem  Hcl] Other (See Comments)    headache   Misc. Sulfonamide Containing Compounds Other (See Comments)   Oxycodone  Nausea Only   Statins Other (See Comments)    myalgias   Sulfa Antibiotics Hives and Swelling   Tape     Paper tape is okay   Aspirin  Rash   Cortisone Rash    flushing   Fd&C Yellow #5 (Tartrazine) Rash   Latex Rash    Sensitive not allergic    Family History  Problem Relation Age of Onset   Sudden death Brother        age 19s, died while playing raquetball   Stroke Mother    Cancer Father        throat   Tongue cancer Father    Heart disease Maternal Grandmother    Cancer Maternal Aunt        type unk,hx smoking   Cancer Maternal Uncle        type unk, hx smok   Cancer Paternal Aunt 33       thinks it was GI/colon- had hole in side and colostomy bag   Cancer Maternal Aunt        type unk, had a colostomy, hx smoking   Cancer Maternal Uncle        cancer maybe-?type unk   Throat  cancer Maternal Uncle        hx smoking   Heart attack Maternal Uncle 50   Cancer Cousin        type unk- died in 77's/50's   Cancer Cousin        type unk, died in 40's/50's   Breast cancer Cousin        x2   Other Other 25       brain tumor  Allergic rhinitis Neg Hx    Angioedema Neg Hx    Asthma Neg Hx    Atopy Neg Hx    Eczema Neg Hx    Immunodeficiency Neg Hx    Urticaria Neg Hx     Prior to Admission medications   Medication Sig Start Date End Date Taking? Authorizing Provider  acetaminophen  (TYLENOL ) 650 MG CR tablet Take 1,300 mg by mouth every 6 (six) hours as needed for pain.   Yes [provider]  apixaban  (ELIQUIS ) 5 MG TABS tablet Take 1 tablet (5 mg total) by mouth 2 (two) times daily. 04/22/20  Yes Allred, Royston Cornea, MD  glipiZIDE  (GLUCOTROL  XL) 2.5 MG 24 hr tablet Take 2.5 mg by mouth daily with breakfast.   Yes [provider]  HUMULIN  70/30 (70-30) 100 UNIT/ML injection Inject 50 Units into the skin 2 (two) times daily with a meal. 06/25/23  Yes [provider]  hydrochlorothiazide  (HYDRODIURIL ) 25 MG tablet Take 25 mg by mouth in the morning. 08/13/16  Yes [provider]  levothyroxine  (SYNTHROID ) 112 MCG tablet TAKE 1 TABLET BY MOUTH ONCE DAILY BEFORE BREAKFAST 08/03/21  Yes [name], MD  loratadine (CLARITIN) 10 MG tablet Take 10 mg by mouth in the morning.   Yes [provider]  Misc Natural Products (IMMUNE FORMULA PO) Take 1 capsule by mouth in the morning and at bedtime. ESSIAC Herbal Extract Capsules Powerful Antioxidant Blend   Yes [provider]  olmesartan (BENICAR) 20 MG tablet Take 20 mg by mouth daily. 09/11/23  Yes [provider]  ondansetron  (ZOFRAN ) 4 MG tablet Take 1 tablet (4 mg total) by mouth every 6 (six) hours as needed for nausea. 09/03/23  Yes Edmisten, Onesimo Bijou, PA  Polyethyl Glycol-Propyl Glycol (SYSTANE) 0.4-0.3 % SOLN Place 1-2 drops into both eyes 3 (three) times daily as  needed (dry/irritated eyes.).   Yes [provider]  Probiotic Product (PROBIOTIC DAILY PO) Take 1 capsule by mouth in the morning.   Yes [provider]  traMADol  (ULTRAM ) 50 MG tablet Take 50-100 mg by mouth every 6 (six) hours as needed for moderate pain (pain score 4-6). 09/09/23  Yes [provider]  citalopram  (CELEXA ) 10 MG tablet Take 10 mg by mouth every Monday, Wednesday, and Friday. 09/25/22   [provider]  GLOBAL INJECT EASE INSULIN  SYR 31G X 5/16" 1 ML MISC Inject into the skin. 02/23/23   [provider]  INSULIN  SYRINGE 1CC/29G 29G X 1/2" 1 ML MISC  10/05/14   [provider]  Multiple Vitamins-Minerals (LUTEIN-ZEAXANTHIN PO) Take 1 tablet by mouth at bedtime.    [provider]  oxyCODONE  (OXY IR/ROXICODONE ) 5 MG immediate release tablet Take 1-2 tablets (5-10 mg total) by mouth every 6 (six) hours as needed for severe pain (pain score 7-10). Patient not taking: Reported on 09/12/2023 09/03/23   Dolphus Friday, [place]    Physical Exam: Vitals:   09/12/23 1145 09/12/23 1200 09/12/23 1215 09/12/23 1230  BP: (!) 198/57 (!) 181/54 (!) 195/54 (!) 195/57  Pulse: 60 (!) 59 60 60  Resp: 11 19 12 15   Temp:      TempSrc:      SpO2: 99% 90% 95% 99%  Weight:      Height:        Constitutional: frail elderly female, NAD, calm, Uncomfortable Eyes: PERRL, lids and conjunctivae normal ENMT: Mucous membranes are moist. Posterior pharynx clear of any exudate or lesions.  Neck: normal, supple,  no masses, no thyromegaly Respiratory: clear to auscultation bilaterally, no wheezing, no crackles. Normal respiratory effort. No accessory muscle use.  Cardiovascular: normal s1, s2 sounds, no murmurs / rubs / gallops. No extremity edema. 2+ pedal pulses. No carotid bruits.  Abdomen: no tenderness, no masses palpated. No hepatosplenomegaly. Bowel sounds positive.  Musculoskeletal: no clubbing / cyanosis. Right knee immobilizer, large  bruising under right knee, edematous right leg and knee; warm feet, palpable pedal pulses in feet.  Very limited ROM on right leg, no contractures. Normal muscle tone.  Skin: no rashes, lesions, ulcers. No induration Neurologic: CN 2-12 grossly intact. Sensation intact, DTR normal. Strength 5/5 in all 4.  Psychiatric: Normal judgment and insight. Alert and oriented x 3. Normal mood.   Labs on Admission: I have personally reviewed following labs and imaging studies  CBC: Recent Labs  Lab 09/12/23 0855  WBC 10.7*  NEUTROABS 7.8*  HGB 9.0*  HCT 27.1*  MCV 106.7*  PLT 338   Basic Metabolic Panel: Recent Labs  Lab 09/12/23 0855  NA 135  K 3.6  CL 97*  CO2 29  GLUCOSE 95  BUN 20  CREATININE 1.21*  CALCIUM 8.9   GFR: Estimated Creatinine Clearance: 45.4 mL/min (A) (by C-G formula based on SCr of 1.21 mg/dL (H)). Liver Function Tests: Recent Labs  Lab 09/12/23 0855  AST 19  ALT 13  ALKPHOS 71  BILITOT 1.8*  PROT 6.3*  ALBUMIN 3.3*   No results for input(s): "LIPASE", "AMYLASE" in the last 168 hours. No results for input(s): "AMMONIA" in the last 168 hours. Coagulation Profile: No results for input(s): "INR", "PROTIME" in the last 168 hours. Cardiac Enzymes: No results for input(s): "CKTOTAL", "CKMB", "CKMBINDEX", "TROPONINI" in the last 168 hours. BNP (last 3 results) No results for input(s): "PROBNP" in the last 8760 hours. HbA1C: No results for input(s): "HGBA1C" in the last 72 hours. CBG: No results for input(s): "GLUCAP" in the last 168 hours. Lipid Profile: No results for input(s): "CHOL", "HDL", "LDLCALC", "TRIG", "CHOLHDL", "LDLDIRECT" in the last 72 hours. Thyroid  Function Tests: No results for input(s): "TSH", "T4TOTAL", "FREET4", "T3FREE", "THYROIDAB" in the last 72 hours. Anemia Panel: No results for input(s): "VITAMINB12", "FOLATE", "FERRITIN", "TIBC", "IRON", "RETICCTPCT" in the last 72 hours. Urine analysis:    Component Value Date/Time    COLORURINE YELLOW 01/29/2017 1020   APPEARANCEUR CLEAR 01/29/2017 1020   APPEARANCEUR Clear 04/01/2014 1719   LABSPEC 1.015 03/18/2017 1619   PHURINE 6.0 03/18/2017 1619   PHURINE 5.0 01/29/2017 1020   GLUCOSEU Negative 03/18/2017 1619   HGBUR Moderate 03/18/2017 1619   HGBUR SMALL (A) 01/29/2017 1020   BILIRUBINUR Negative 03/18/2017 1619   KETONESUR Negative 03/18/2017 1619   KETONESUR NEGATIVE 01/29/2017 1020   PROTEINUR 30 03/18/2017 1619   PROTEINUR NEGATIVE 01/29/2017 1020   UROBILINOGEN 0.2 03/18/2017 1619   NITRITE Negative 03/18/2017 1619   NITRITE NEGATIVE 01/29/2017 1020   LEUKOCYTESUR Large 03/18/2017 1619    Radiological Exams on Admission: DG Knee Complete 4 Views Right Result Date: 09/12/2023 CLINICAL DATA:  Right knee pain. EXAM: RIGHT KNEE - COMPLETE 4+ VIEW COMPARISON:  None Available. FINDINGS: There is a nondisplaced fracture of the medial distal femur just above the medial epicondyle. Evaluation of the fracture is limited due to osteopenia. No other acute fracture. There is no dislocation. Total right knee arthroplasty appears intact and in anatomic alignment. There is a moderate joint effusion. Small radiolucent pockets inferior to the patella may represent air or fat lobules. There  is diffuse subcutaneous edema. IMPRESSION: 1. Nondisplaced fracture of the medial distal femur. 2. Moderate joint effusion. Electronically Signed   By: Angus Bark M.D.   On: 09/12/2023 11:22   US  Venous Img Lower Unilateral Right Result Date: 09/12/2023 CLINICAL DATA:  Right leg swelling EXAM: Right LOWER EXTREMITY VENOUS DOPPLER ULTRASOUND TECHNIQUE: Gray-scale sonography with compression, as well as color and duplex ultrasound, were performed to evaluate the deep venous system(s) from the level of the common femoral vein through the popliteal and proximal calf veins. COMPARISON:  None Available. FINDINGS: VENOUS Normal compressibility of the common femoral, superficial femoral, and  popliteal veins, as well as the visualized calf veins. Visualized portions of profunda femoral vein and great saphenous vein unremarkable. No filling defects to suggest DVT on grayscale or color Doppler imaging. Doppler waveforms show normal direction of venous flow, normal respiratory plasticity and response to augmentation. Limited views of the contralateral common femoral vein are unremarkable. OTHER None. Limitations: none IMPRESSION: 1. No evidence of right lower extremity DVT. Electronically Signed   By: Reagan Camera M.D.   On: 09/12/2023 09:47   EKG: Independently reviewed.   Assessment/Plan Principal Problem:   Closed fracture of medial condyle of right femur (HCC) Active Problems:   Uncontrolled pain   Second degree Mobitz II AV block   Uncontrolled hypertension   A-fib (HCC)   Pacemaker   Type 2 diabetes mellitus without complication (HCC)   Hypothyroidism   Closed right distal medial condyle femur fracture S/p recent total right knee replacement  - discussed with [facility], this is nonoperative fracture - they recommended pain control admission - they recommended TDWB with walker and 1-2 person assist - pt to follow up with Dr. France Ina next week as scheduled (Wed) - PT evaluation in AM for further assessment   Uncontrolled pain - schedule acetaminophen  975 mg every 6 hours  - oxycodone  5 mg every 4 hours PRN moderate pain - fentanyl  50 mcg every 2 hours PRN severe pain - schedule stool softeners  - added Zofran  IV for nausea symptoms   Uncontrolled hypertension - pt is having very high BP readings likely exacerbated by acute pain - added amlodipine, hydralazine 25 mg TID, IV hydralazine ordered - working on pain control as noted above - further titrations as needed   Type 2 DM, uncontrolled  - as evidenced by A1c of 7.6%  - we have resumed home 70/30 insulin  but reduced dose slightly to 45 units BID with meals - monitoring CBG 5 times per  day  Hypothyroidism - resume home levothyroxine  112 mcg daily   Allergic Rhinitis - resume home loratadine daily   Acquired thrombophilia - apixaban  5 mg BID resumed  Depression/Anxiety - resume home citalopram  10 mg   DVT prophylaxis: apixaban    Code Status: Full   Family Communication:   Disposition Plan: TBD   Consults called: orthopedics   Admission status: OBV   Level of care: Med-Surg Faustino Hook MD [facility] How to contact the [facility] Attending or Consulting provider 7A - 7P or covering provider during after hours 7P -7A, for this patient?  Check the care team in [facility] and look for a) attending/consulting [facility] provider listed and b) the [facility] team listed Log into [email or web address] and use [place]'s universal password to access. If you do not have the password, please contact the hospital operator. Locate the [facility] provider you are looking for under [facility] and page to a number that you can be directly reached.  If you still have difficulty reaching the provider, please page the [facility] (Director on Call) for the Hospitalists listed on amion for assistance.   If 7PM-7AM, please contact night-coverage [email or web address] Password [facility]  09/12/2023, 12:57 PM

## 2023-09-12 NOTE — ED Notes (Signed)
 [place] at bedside

## 2023-09-13 DIAGNOSIS — I441 Atrioventricular block, second degree: Secondary | ICD-10-CM | POA: Diagnosis not present

## 2023-09-13 DIAGNOSIS — E1159 Type 2 diabetes mellitus with other circulatory complications: Secondary | ICD-10-CM | POA: Diagnosis not present

## 2023-09-13 DIAGNOSIS — Z7984 Long term (current) use of oral hypoglycemic drugs: Secondary | ICD-10-CM | POA: Diagnosis not present

## 2023-09-13 DIAGNOSIS — R262 Difficulty in walking, not elsewhere classified: Secondary | ICD-10-CM | POA: Diagnosis not present

## 2023-09-13 DIAGNOSIS — Z79890 Hormone replacement therapy (postmenopausal): Secondary | ICD-10-CM | POA: Diagnosis not present

## 2023-09-13 DIAGNOSIS — Z923 Personal history of irradiation: Secondary | ICD-10-CM | POA: Diagnosis not present

## 2023-09-13 DIAGNOSIS — E119 Type 2 diabetes mellitus without complications: Secondary | ICD-10-CM | POA: Diagnosis not present

## 2023-09-13 DIAGNOSIS — F419 Anxiety disorder, unspecified: Secondary | ICD-10-CM | POA: Diagnosis present

## 2023-09-13 DIAGNOSIS — S72434D Nondisplaced fracture of medial condyle of right femur, subsequent encounter for closed fracture with routine healing: Secondary | ICD-10-CM

## 2023-09-13 DIAGNOSIS — I10 Essential (primary) hypertension: Secondary | ICD-10-CM | POA: Diagnosis not present

## 2023-09-13 DIAGNOSIS — E039 Hypothyroidism, unspecified: Secondary | ICD-10-CM | POA: Diagnosis not present

## 2023-09-13 DIAGNOSIS — F32A Depression, unspecified: Secondary | ICD-10-CM | POA: Diagnosis present

## 2023-09-13 DIAGNOSIS — S72431A Displaced fracture of medial condyle of right femur, initial encounter for closed fracture: Secondary | ICD-10-CM | POA: Diagnosis present

## 2023-09-13 DIAGNOSIS — E669 Obesity, unspecified: Secondary | ICD-10-CM | POA: Diagnosis present

## 2023-09-13 DIAGNOSIS — Z950 Presence of cardiac pacemaker: Secondary | ICD-10-CM | POA: Diagnosis not present

## 2023-09-13 DIAGNOSIS — I4891 Unspecified atrial fibrillation: Secondary | ICD-10-CM | POA: Diagnosis present

## 2023-09-13 DIAGNOSIS — Z7901 Long term (current) use of anticoagulants: Secondary | ICD-10-CM | POA: Diagnosis not present

## 2023-09-13 DIAGNOSIS — D6859 Other primary thrombophilia: Secondary | ICD-10-CM | POA: Diagnosis present

## 2023-09-13 DIAGNOSIS — D62 Acute posthemorrhagic anemia: Secondary | ICD-10-CM | POA: Diagnosis present

## 2023-09-13 DIAGNOSIS — J309 Allergic rhinitis, unspecified: Secondary | ICD-10-CM | POA: Diagnosis not present

## 2023-09-13 DIAGNOSIS — R488 Other symbolic dysfunctions: Secondary | ICD-10-CM | POA: Diagnosis not present

## 2023-09-13 DIAGNOSIS — Z85828 Personal history of other malignant neoplasm of skin: Secondary | ICD-10-CM | POA: Diagnosis not present

## 2023-09-13 DIAGNOSIS — I480 Paroxysmal atrial fibrillation: Secondary | ICD-10-CM | POA: Diagnosis not present

## 2023-09-13 DIAGNOSIS — E11649 Type 2 diabetes mellitus with hypoglycemia without coma: Secondary | ICD-10-CM | POA: Diagnosis not present

## 2023-09-13 DIAGNOSIS — Y792 Prosthetic and other implants, materials and accessory orthopedic devices associated with adverse incidents: Secondary | ICD-10-CM | POA: Diagnosis present

## 2023-09-13 DIAGNOSIS — R52 Pain, unspecified: Secondary | ICD-10-CM | POA: Diagnosis not present

## 2023-09-13 DIAGNOSIS — T8484XA Pain due to internal orthopedic prosthetic devices, implants and grafts, initial encounter: Secondary | ICD-10-CM | POA: Diagnosis present

## 2023-09-13 DIAGNOSIS — Z888 Allergy status to other drugs, medicaments and biological substances status: Secondary | ICD-10-CM | POA: Diagnosis not present

## 2023-09-13 DIAGNOSIS — M6281 Muscle weakness (generalized): Secondary | ICD-10-CM | POA: Diagnosis not present

## 2023-09-13 DIAGNOSIS — I152 Hypertension secondary to endocrine disorders: Secondary | ICD-10-CM | POA: Diagnosis not present

## 2023-09-13 DIAGNOSIS — Z8249 Family history of ischemic heart disease and other diseases of the circulatory system: Secondary | ICD-10-CM | POA: Diagnosis not present

## 2023-09-13 DIAGNOSIS — Z794 Long term (current) use of insulin: Secondary | ICD-10-CM | POA: Diagnosis not present

## 2023-09-13 DIAGNOSIS — Z96651 Presence of right artificial knee joint: Secondary | ICD-10-CM | POA: Diagnosis not present

## 2023-09-13 DIAGNOSIS — R600 Localized edema: Secondary | ICD-10-CM | POA: Diagnosis not present

## 2023-09-13 DIAGNOSIS — S22080D Wedge compression fracture of T11-T12 vertebra, subsequent encounter for fracture with routine healing: Secondary | ICD-10-CM | POA: Diagnosis not present

## 2023-09-13 DIAGNOSIS — Z9181 History of falling: Secondary | ICD-10-CM | POA: Diagnosis not present

## 2023-09-13 DIAGNOSIS — R278 Other lack of coordination: Secondary | ICD-10-CM | POA: Diagnosis not present

## 2023-09-13 DIAGNOSIS — Z79899 Other long term (current) drug therapy: Secondary | ICD-10-CM | POA: Diagnosis not present

## 2023-09-13 DIAGNOSIS — T383X5A Adverse effect of insulin and oral hypoglycemic [antidiabetic] drugs, initial encounter: Secondary | ICD-10-CM | POA: Diagnosis present

## 2023-09-13 DIAGNOSIS — D649 Anemia, unspecified: Secondary | ICD-10-CM | POA: Diagnosis not present

## 2023-09-13 DIAGNOSIS — D6869 Other thrombophilia: Secondary | ICD-10-CM | POA: Diagnosis not present

## 2023-09-13 LAB — GLUCOSE, CAPILLARY
Glucose-Capillary: 115 mg/dL — ABNORMAL HIGH (ref 70–99)
Glucose-Capillary: 139 mg/dL — ABNORMAL HIGH (ref 70–99)
Glucose-Capillary: 179 mg/dL — ABNORMAL HIGH (ref 70–99)
Glucose-Capillary: 83 mg/dL (ref 70–99)
Glucose-Capillary: 147 mg/dL — ABNORMAL HIGH (ref 70–99)

## 2023-09-13 LAB — CBC
HCT: 23.8 % — ABNORMAL LOW (ref 36.0–46.0)
Hemoglobin: 7.5 g/dL — ABNORMAL LOW (ref 12.0–15.0)
MCH: 33.8 pg (ref 26.0–34.0)
MCHC: 31.5 g/dL (ref 30.0–36.0)
MCV: 107.2 fL — ABNORMAL HIGH (ref 80.0–100.0)
Platelets: 276 10*3/uL (ref 150–400)
RBC: 2.22 MIL/uL — ABNORMAL LOW (ref 3.87–5.11)
RDW: 16.6 % — ABNORMAL HIGH (ref 11.5–15.5)
WBC: 7.7 10*3/uL (ref 4.0–10.5)
nRBC: 0.4 % — ABNORMAL HIGH (ref 0.0–0.2)

## 2023-09-13 LAB — HEMOGLOBIN AND HEMATOCRIT, BLOOD
HCT: 25.3 % — ABNORMAL LOW (ref 36.0–46.0)
Hemoglobin: 8.2 g/dL — ABNORMAL LOW (ref 12.0–15.0)

## 2023-09-13 LAB — BASIC METABOLIC PANEL WITH GFR
Anion gap: 5 (ref 5–15)
BUN: 21 mg/dL (ref 8–23)
CO2: 30 mmol/L (ref 22–32)
Calcium: 8 mg/dL — ABNORMAL LOW (ref 8.9–10.3)
Chloride: 99 mmol/L (ref 98–111)
Creatinine, Ser: 1.28 mg/dL — ABNORMAL HIGH (ref 0.44–1.00)
GFR, Estimated: 43 mL/min — ABNORMAL LOW (ref >60)
Glucose, Bld: 115 mg/dL — ABNORMAL HIGH (ref 70–99)
Potassium: 3.8 mmol/L (ref 3.5–5.1)
Sodium: 134 mmol/L — ABNORMAL LOW (ref 135–145)

## 2023-09-13 LAB — MAGNESIUM: Magnesium: 1.9 mg/dL (ref 1.7–2.4)

## 2023-09-13 MED ORDER — HYDRALAZINE HCL 25 MG PO TABS
50.0000 mg | ORAL_TABLET | Freq: Three times a day (TID) | ORAL | Status: DC
  Administered 2023-09-13 – 2023-09-15 (×6): 50 mg via ORAL
  Filled 2023-09-13 (×5): qty 2

## 2023-09-13 MED ORDER — POLYETHYLENE GLYCOL 3350 17 G PO PACK
17.0000 g | PACK | Freq: Every day | ORAL | Status: DC
  Administered 2023-09-15 – 2023-09-16 (×2): 17 g via ORAL
  Filled 2023-09-13 (×3): qty 1

## 2023-09-13 NOTE — Progress Notes (Signed)
 Transition of Care (TOC) -30 day Note        Transition of Care [facility]) CM/SW Contact  Name: Alicia [name]  Phone Number: [phone]  MUST ID: [id number]   To Whom it May Concern:   Please be advised that the above patient will require a short-term nursing home stay, anticipated 30 days or less rehabilitation and strengthening. The plan is for return home.

## 2023-09-13 NOTE — Plan of Care (Signed)

## 2023-09-13 NOTE — TOC Progression Note (Signed)
 Transition of Care [facility]) - Progression Note    Patient Details  Name: Alicia [name] MRN: [id number] Date of Birth: May 27, 1945  Transition of Care [facility]) CM/SW Contact  Linnea Richards, LCSW Phone Number: 09/13/2023, 11:33 AM  Clinical Narrative:     TOC following. PT recommending SNF rehab at dc. Met with pt at bedside to review.  Pt agreeable to SNF referrals. CMS provider options reviewed. Will refer as requested and start insurance auth.  Will follow.  Expected Discharge Plan: Skilled Nursing Facility Barriers to Discharge: Continued Medical Work up  Expected Discharge Plan and Services In-house Referral: Clinical Social Work Discharge Planning Services: CM Consult Post Acute Care Choice: Skilled Nursing Facility Living arrangements for the past 2 months: Single Family Home                             HH Agency: Other - See comment (Patient was doing OPPT at [facility] until they started coming into the home)         Social Determinants of Health (SDOH) Interventions SDOH Screenings   Food Insecurity: No Food Insecurity (09/12/2023)  Housing: Low Risk  (09/12/2023)  Transportation Needs: No Transportation Needs (09/12/2023)  Utilities: Not At Risk (09/12/2023)  Social Connections: Moderately Isolated (09/12/2023)  Tobacco Use: Low Risk  (09/12/2023)    Readmission Risk Interventions     No data to display

## 2023-09-13 NOTE — [place] FL2 (Signed)
 [place]  MEDICAID FL2 LEVEL OF CARE FORM     IDENTIFICATION  Patient Name: Alicia [name] Birthdate: 1945-05-23 Sex: female Admission Date (Current Location): 09/12/2023  [facility] and [place] Number:  [employer] and Address:  [facility],  [address], [place] [address]      Provider Number: [id number]  Attending Physician Name and Address:  Rayfield Cairo, MD  Relative Name and Phone Number:       Current Level of Care: Hospital Recommended Level of Care: Skilled Nursing Facility Prior Approval Number:    Date Approved/Denied:   PASRR Number:    Discharge Plan: SNF    Current Diagnoses: Patient Active Problem List   Diagnosis Date Noted   Closed fracture of medial condyle of right femur (HCC) 09/12/2023   Uncontrolled pain 09/12/2023   Primary osteoarthritis of right knee 09/02/2023   Diarrhea 01/10/2023   Other fatigue 01/10/2023   Genetic testing 07/04/2017   Osteoarthritis of knee 06/11/2017   Family history of cancer    Family history of breast cancer    Endometrial cancer (HCC) 02/19/2017   Endometrial ca (HCC) 01/16/2017   Cervical polyp 12/12/2016   PMB (postmenopausal bleeding) 12/12/2016   Dermatitis 09/19/2015   Allergic urticaria 09/19/2015   Hypothyroidism 10/05/2014   Morbid obesity (HCC) 04/17/2014   Type 2 diabetes mellitus without complication (HCC) 10/02/2013   Disorder of bursae and tendons in shoulder region 07/07/2013   A-fib (HCC) 07/03/2013   Second degree Mobitz II AV block 03/18/2013   Syncope 03/18/2013   Uncontrolled hypertension 03/18/2013   Pacemaker 12/05/2012    Orientation RESPIRATION BLADDER Height & Weight     Self, Time, Situation, Place  Normal Continent Weight: 228 lb (103.4 kg) Height:  5' 5.5" (166.4 cm)  BEHAVIORAL SYMPTOMS/MOOD NEUROLOGICAL BOWEL NUTRITION STATUS      Continent Diet (see dc summary)  AMBULATORY STATUS COMMUNICATION OF NEEDS Skin   Extensive Assist Verbally  Surgical wounds                       Personal Care Assistance Level of Assistance  Bathing, Feeding, Dressing Bathing Assistance: Limited assistance Feeding assistance: Independent Dressing Assistance: Limited assistance     Functional Limitations Info  Sight, Hearing, Speech Sight Info: Adequate Hearing Info: Adequate Speech Info: Adequate    SPECIAL CARE FACTORS FREQUENCY  PT (By licensed PT), OT (By licensed OT)     PT Frequency: 5x week OT Frequency: 5x week            Contractures Contractures Info: Not present    Additional Factors Info  Code Status, Allergies Code Status Info: Full Allergies Info: Ace Inhibitors, Cardizem  (Diltiazem  Hcl), Misc. Sulfonamide Containing Compounds, Oxycodone , Statins, Sulfa Antibiotics, Tape, Aspirin , Cortisone, Fd&c Yellow #5 (Tartrazine), Latex           Current Medications (09/13/2023):  This is the current hospital active medication list Current Facility-Administered Medications  Medication Dose Route Frequency Provider Last Rate Last Admin   acetaminophen  (TYLENOL ) tablet 1,000 mg  1,000 mg Oral Q6H Johnson, Clanford L, MD   1,000 mg at 09/13/23 0851   acidophilus (RISAQUAD) capsule 1 capsule  1 capsule Oral q AM Johnson, Clanford L, MD   1 capsule at 09/13/23 0851   amLODipine  (NORVASC ) tablet 5 mg  5 mg Oral Daily Johnson, Clanford L, MD   5 mg at 09/13/23 0853   apixaban  (ELIQUIS ) tablet 5 mg  5 mg Oral BID  Lincoln Renshaw, Clanford L, MD   5 mg at 09/13/23 8413   bisacodyl  (DULCOLAX) EC tablet 5 mg  5 mg Oral Daily PRN Lincoln Renshaw, Clanford L, MD       citalopram  (CELEXA ) tablet 10 mg  10 mg Oral Q M,W,F Johnson, Clanford L, MD   10 mg at 09/13/23 0854   docusate sodium  (COLACE) capsule 100 mg  100 mg Oral BID Lincoln Renshaw, Clanford L, MD   100 mg at 09/13/23 0851   fentaNYL  (SUBLIMAZE ) injection 50 mcg  50 mcg Intravenous Q2H PRN Lincoln Renshaw, Clanford L, MD   50 mcg at 09/13/23 1058   hydrALAZINE  (APRESOLINE ) injection 10 mg  10 mg  Intravenous Q4H PRN Johnson, Clanford L, MD       hydrALAZINE  (APRESOLINE ) tablet 50 mg  50 mg Oral Q8H Johnson, Clanford L, MD       hydrochlorothiazide  (HYDRODIURIL ) tablet 25 mg  25 mg Oral q AM Johnson, Clanford L, MD   25 mg at 09/13/23 0851   insulin  aspart (novoLOG ) injection 0-15 Units  0-15 Units Subcutaneous TID WC Johnson, Clanford L, MD   2 Units at 09/13/23 2440   insulin  aspart (novoLOG ) injection 0-5 Units  0-5 Units Subcutaneous QHS Johnson, Clanford L, MD       insulin  aspart protamine- aspart (NOVOLOG  MIX 70/30) injection 45 Units  45 Units Subcutaneous BID WC Johnson, Clanford L, MD   45 Units at 09/13/23 1053   irbesartan  (AVAPRO ) tablet 150 mg  150 mg Oral Daily Johnson, Clanford L, MD   150 mg at 09/13/23 1027   levothyroxine  (SYNTHROID ) tablet 112 mcg  112 mcg Oral QAC breakfast Lincoln Renshaw, Clanford L, MD   112 mcg at 09/13/23 2536   loratadine  (CLARITIN ) tablet 10 mg  10 mg Oral q AM Johnson, Clanford L, MD   10 mg at 09/13/23 6440   methocarbamol  (ROBAXIN ) injection 500 mg  500 mg Intravenous Q6H PRN Johnson, Clanford L, MD       ondansetron  (ZOFRAN ) tablet 4 mg  4 mg Oral Q6H PRN Johnson, Clanford L, MD       Or   ondansetron  (ZOFRAN ) injection 4 mg  4 mg Intravenous Q6H PRN Lincoln Renshaw, Clanford L, MD   4 mg at 09/13/23 0901   oxyCODONE  (Oxy IR/ROXICODONE ) immediate release tablet 5-7.5 mg  5-7.5 mg Oral Q6H PRN Johnson, Clanford L, MD       polyethylene glycol (MIRALAX  / GLYCOLAX ) packet 17 g  17 g Oral Daily Johnson, Clanford L, MD       polyvinyl alcohol  (LIQUIFILM TEARS) 1.4 % ophthalmic solution 1 drop  1 drop Both Eyes TID PRN Johnson, Clanford L, MD       traZODone  (DESYREL ) tablet 25 mg  25 mg Oral QHS PRN Lincoln Renshaw, Clanford L, MD   25 mg at 09/12/23 2139     Discharge Medications: Please see discharge summary for a list of discharge medications.  Relevant Imaging Results:  Relevant Lab Results:   Additional Information SSN: [id number]  Linnea Richards,  LCSW

## 2023-09-13 NOTE — Plan of Care (Signed)
  Problem: Acute Rehab OT Goals (only OT should resolve) Goal: Pt. Will Perform Grooming Flowsheets (Taken 09/13/2023 1258) Pt Will Perform Grooming:  with modified independence  sitting Goal: Pt. Will Perform Upper Body Dressing Flowsheets (Taken 09/13/2023 1258) Pt Will Perform Upper Body Dressing:  with modified independence  sitting Goal: Pt. Will Perform Lower Body Dressing Flowsheets (Taken 09/13/2023 1258) Pt Will Perform Lower Body Dressing:  with mod assist  with adaptive equipment  sitting/lateral leans Goal: Pt. Will Transfer To Toilet Flowsheets (Taken 09/13/2023 1258) Pt Will Transfer to Toilet:  with contact guard assist  stand pivot transfer Goal: Pt. Will Perform Toileting-Clothing Manipulation Flowsheets (Taken 09/13/2023 1258) Pt Will Perform Toileting - Clothing Manipulation and hygiene:  with min assist  sitting/lateral leans  sit to/from stand  with adaptive equipment Goal: Pt/Caregiver Will Perform Home Exercise Program Flowsheets (Taken 09/13/2023 1258) Pt/caregiver will Perform Home Exercise Program:  Increased ROM  Increased strength  Both right and left upper extremity  With minimal assist  Kenni Newton OT, MOT

## 2023-09-13 NOTE — Plan of Care (Signed)
  Problem: Acute Rehab PT Goals(only PT should resolve) Goal: Pt Will Go Supine/Side To Sit Outcome: Progressing Flowsheets (Taken 09/13/2023 1220) Pt will go Supine/Side to Sit: with minimal assist Goal: Patient Will Transfer Sit To/From Stand Outcome: Progressing Flowsheets (Taken 09/13/2023 1220) Patient will transfer sit to/from stand: with minimal assist Goal: Pt Will Transfer Bed To Chair/Chair To Bed Outcome: Progressing Flowsheets (Taken 09/13/2023 1220) Pt will Transfer Bed to Chair/Chair to Bed: with min assist Goal: Pt Will Ambulate Outcome: Progressing Flowsheets (Taken 09/13/2023 1220) Pt will Ambulate:  15 feet  with minimal assist  with moderate assist  with rolling walker   12:21 PM, 09/13/23 Walton Guppy, MPT Physical Therapist with [facility] [phone] office [phone] mobile phone

## 2023-09-13 NOTE — Progress Notes (Addendum)
 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 France Ina was discharged on 09/03/23.  She had been working with outpatient PT and home health PT.  She has been having very high blood pressures postoperatively.  Her PCP started her on additional blood pressure medication couple of days ago.  Reports that she has had increased swelling in he right knee and leg since 09/11/2023 and was concerned about a blood clot.  She reports that she had been ambulating by the touchdown weightbearing with PT up until about 3 days ago when the swelling and pain became so severe she has not been able to ambulate.  She was advised by orthopedics to come to the ED to rule out a blood clot.  She has been taking tramadol  at home for pain.  DVT ultrasound was negative.  She had x-ray imaging and a CT scan that was demonstrating a nondisplaced distal medial condyle femoral fracture on the right.  This was discussed with [facility] who recommended that this is nonoperative and recommended right knee immobilization with a mobilization recommendation for touchdown weightbearing with a walker.  She is being admitted for pain control as she is having difficulty ambulating and pain has been uncontrolled in addition to elevated blood pressures.  She is being admitted for further management.   Assessment and Plan:  Closed right distal medial condyle femur fracture S/p recent total right knee replacement 09/02/23  - discussed with [facility], this is a nonoperative fracture - they recommended pain control admission as needed  and outpatient follow up - they recommended TDWB with walker and 1-2 person assist - pt to follow up with Dr. France Ina next week as scheduled (Wed) - PT evaluation pending    Uncontrolled pain - schedule acetaminophen  975 mg every 6 hours  - oxycodone  5 mg every 4 hours PRN moderate pain - fentanyl  50 mcg every 2 hours PRN severe pain - schedule stool softeners  - added Zofran  IV for nausea symptoms    Uncontrolled hypertension - pt is having very high BP readings likely exacerbated by acute pain - added amlodipine , increasing hydralazine  to 50 mg TID, IV hydralazine  ordered - working on pain control as noted above - further titrations as needed   Acute blood loss anemia - Hg down to 7.5  - likely bleeding from acute fracture - monitoring Hg, recheck CBC in AM  - transfuse for Hg<7   Type 2 DM, uncontrolled  - as evidenced by A1c of 7.6%  - we have resumed home 70/30 insulin  but reduced dose slightly to 45 units BID with meals - monitoring CBG 5 times per day  CBG (last 3)  Recent Labs    09/12/23 2021 09/13/23 0525 09/13/23 0740  GLUCAP 172* 115* 139*   Hypothyroidism - resume home levothyroxine  112 mcg daily    Allergic Rhinitis - resume home loratadine  daily    Acquired thrombophilia - apixaban  5 mg BID resumed   Depression/Anxiety - resume home citalopram  10 mg   DVT prophylaxis: apixaban   Code Status: Full  Family Communication:  Disposition: anticipating SNF rehab  Consultants:  PT/OT   Procedures:  Antimicrobials:    Subjective: Pt says she is willing to work with PT today and agreeable to SNF rehab as needed.    Objective: Vitals:   09/12/23 2144 09/13/23 0026 09/13/23 0441 09/13/23 0851  BP: (!) 149/68 (!) 159/43 (!) 159/46 (!) 155/39  Pulse:  62 60   Resp:  20 17   Temp:  98 F (36.7 C) (!) 97.5 F (36.4 C)   TempSrc:  Oral Oral   SpO2:  94% 96%   Weight:      Height:        Intake/Output Summary (Last 24 hours) at 09/13/2023  1120 Last data filed at 09/13/2023 0500 Gross per 24 hour  Intake 240 ml  Output 850 ml  Net -610 ml   Filed Weights   09/12/23 0839  Weight: 103.4 kg   Examination:  General exam: Appears calm and comfortable  Respiratory system: Clear to auscultation. Respiratory effort normal. Cardiovascular system: normal S1 & S2 heard. No JVD, murmurs, rubs, gallops or clicks. No pedal edema. Gastrointestinal system: Abdomen is nondistended, soft and nontender. No organomegaly or masses felt. Normal bowel sounds heard. Central nervous system: Alert and oriented. No focal neurological deficits. Extremities: right knee in immobilizer, large bruise under right patella area unchanged. Skin: No rashes, lesions or ulcers. Psychiatry: Judgement and insight appear normal. Mood & affect appropriate.   Data Reviewed: I have personally reviewed following labs and imaging studies  CBC: Recent Labs  Lab 09/12/23 0855 09/13/23 0400  WBC 10.7* 7.7  NEUTROABS 7.8*  --   HGB 9.0* 7.5*  HCT 27.1* 23.8*  MCV 106.7* 107.2*  PLT 338 276    Basic Metabolic Panel: Recent Labs  Lab 09/12/23 0855 09/13/23 0400  NA 135 134*  K 3.6 3.8  CL 97* 99  CO2 29 30  GLUCOSE 95 115*  BUN 20 21  CREATININE 1.21* 1.28*  CALCIUM 8.9 8.0*  MG  --  1.9    CBG: Recent Labs  Lab 09/12/23 1643 09/12/23 2021 09/13/23 0525 09/13/23 0740  GLUCAP 116* 172* 115* 139*    No results found for this or any previous visit (from the past 240 hours).   Radiology Studies: CT Knee Right Wo Contrast Result Date: 09/12/2023 CLINICAL DATA:  Knee trauma, distal femur fracture. 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 EXAM: CT OF THE RIGHT KNEE WITHOUT CONTRAST TECHNIQUE: Multidetector CT imaging of the right knee was performed according to the standard protocol. Multiplanar CT image reconstructions were also generated. RADIATION DOSE REDUCTION: This exam was performed according to the departmental  dose-optimization program which includes automated exposure control, adjustment of the mA and/or kV according to patient size and/or use of iterative reconstruction technique. COMPARISON:  Radiographs 09/12/2023 FINDINGS: Bones/Joint/Cartilage Sagittally oriented acute fracture observed extending from the medial distal metaphysis into the medial femoral epicondyle and medial femoral condyle along the medial margin of the femoral component of the knee prosthesis. No patellar or proximal tibial or fibular fracture. Trace amount of gas in the joint and in the marrow of the distal femur, considered postoperative. Large knee hemarthrosis. Ligaments Suboptimally assessed by CT. Muscles and Tendons Low-level edema tracks between the medial head gastrocnemius and the soleus muscle, probably incidental although a plantaris tear can cause a similar appearance. Soft tissues Atherosclerosis.  Subcutaneous edema along the distal thigh. IMPRESSION: 1. Sagittally oriented acute fracture extending from the medial distal metaphysis into the medial femoral epicondyle and medial femoral condyle along the medial margin of  the femoral component of the knee prosthesis. 2. Large knee hemarthrosis. 3. Low-level edema tracks between the medial head gastrocnemius and the soleus muscle, probably incidental although a plantaris tear can cause a similar appearance. 4. Atherosclerosis. 5. Subcutaneous edema along the distal thigh. Electronically Signed   By: Freida Jes M.D.   On: 09/12/2023 13:32   DG Knee Complete 4 Views Right Result Date: 09/12/2023 CLINICAL DATA:  Right knee pain. EXAM: RIGHT KNEE - COMPLETE 4+ VIEW COMPARISON:  None Available. FINDINGS: There is a nondisplaced fracture of the medial distal femur just above the medial epicondyle. Evaluation of the fracture is limited due to osteopenia. No other acute fracture. There is no dislocation. Total right knee arthroplasty appears intact and in anatomic alignment. There is  a moderate joint effusion. Small radiolucent pockets inferior to the patella may represent air or fat lobules. There is diffuse subcutaneous edema. IMPRESSION: 1. Nondisplaced fracture of the medial distal femur. 2. Moderate joint effusion. Electronically Signed   By: Angus Bark M.D.   On: 09/12/2023 11:22   US  Venous Img Lower Unilateral Right Result Date: 09/12/2023 CLINICAL DATA:  Right leg swelling EXAM: Right LOWER EXTREMITY VENOUS DOPPLER ULTRASOUND TECHNIQUE: Gray-scale sonography with compression, as well as color and duplex ultrasound, were performed to evaluate the deep venous system(s) from the level of the common femoral vein through the popliteal and proximal calf veins. COMPARISON:  None Available. FINDINGS: VENOUS Normal compressibility of the common femoral, superficial femoral, and popliteal veins, as well as the visualized calf veins. Visualized portions of profunda femoral vein and great saphenous vein unremarkable. No filling defects to suggest DVT on grayscale or color Doppler imaging. Doppler waveforms show normal direction of venous flow, normal respiratory plasticity and response to augmentation. Limited views of the contralateral common femoral vein are unremarkable. OTHER None. Limitations: none IMPRESSION: 1. No evidence of right lower extremity DVT. Electronically Signed   By: Reagan Camera M.D.   On: 09/12/2023 09:47    Scheduled Meds:  acetaminophen   1,000 mg Oral Q6H   acidophilus  1 capsule Oral q AM   amLODipine   5 mg Oral Daily   apixaban   5 mg Oral BID   citalopram   10 mg Oral Q M,W,F   docusate sodium   100 mg Oral BID   hydrALAZINE   50 mg Oral Q8H   hydrochlorothiazide   25 mg Oral q AM   insulin  aspart  0-15 Units Subcutaneous TID WC   insulin  aspart  0-5 Units Subcutaneous QHS   insulin  aspart protamine- aspart  45 Units Subcutaneous BID WC   irbesartan   150 mg Oral Daily   levothyroxine   112 mcg Oral QAC breakfast   loratadine   10 mg Oral q AM    polyethylene glycol  17 g Oral Daily   Continuous Infusions:   LOS: 0 days   Time spent: 53 mins  Logan [place] Lincoln Renshaw, MD How to contact the [facility] Attending or Consulting provider 7A - 7P or covering provider during after hours 7P -7A, for this patient?  Check the care team in [facility] and look for a) attending/consulting TR[facility] provider listed and b) the TRH team listed Log into [email or web address] to find provider on call.  Locate the TR[facility] provider you are looking for under [facility] and page to a number that you can be directly reached. If you still have difficulty reaching the provider, please page the [facility] (Director on Call) for the Hospitalists listed on amion for assistance.  09/13/2023, 11:20 AM

## 2023-09-13 NOTE — Evaluation (Signed)
 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 Rossie Coon was discharged on 09/03/23.  She had been working with outpatient PT and home health PT.  She has been having very high blood pressures postoperatively.  Her PCP started her on additional blood pressure medication couple of days ago.  Reports that she has had increased swelling in he right knee and leg since 09/11/2023 and was concerned about a blood clot.  She reports that she had been ambulating by the touchdown weightbearing with PT up until about 3 days ago when the swelling and pain became so severe she has not been able to ambulate.  She was advised by orthopedics to come to the ED to rule out a blood clot.  She has been taking tramadol  at home for pain.  DVT ultrasound was negative.  She had x-ray imaging and a CT scan that was demonstrating a nondisplaced distal medial condyle femoral fracture on the right.  This was discussed with [facility] who recommended that this is nonoperative and recommended right knee immobilization with a mobilization recommendation for touchdown weightbearing with a walker.  She is being admitted for pain control as she is having difficulty ambulating and pain has been uncontrolled in addition to elevated blood pressures.  She is being admitted for further management. (per MD)     Clinical Impressions Pt agreeable to OT and PT co-evaluation. Pt reports limited physical assistance at home from family. Pt required max to total assist for lower body ADL's at this time. Limited for upper body ADL's due to A/ROM limitations in B UE. Pt required mod A for bed  mobility and transfer to chair/BSC today. Pt needs much assist at this time and does not feel she can have enough support at home. Pt left on the [facility] with call bell within reach. Pt will benefit from continued OT in the hospital and recommended venue below to increase strength, balance, and endurance for safe ADL's.        If plan is discharge home, recommend the following:   A lot of help with walking and/or transfers;A lot of help with bathing/dressing/bathroom;Assistance with cooking/housework;Assist for transportation;Help with stairs or ramp for entrance     Functional Status Assessment   Patient has had a recent decline in their functional status and demonstrates the ability to make significant improvements in function in a reasonable and predictable amount of time.     Equipment Recommendations   None recommended by OT             Precautions/Restrictions   Precautions Precautions: Fall Recall of Precautions/Restrictions: Intact Required Braces or Orthoses: Knee Immobilizer - Right Knee Immobilizer - Right: On at all times Restrictions Weight Bearing Restrictions Per Provider Order: Yes RLE Weight Bearing Per Provider Order: Touchdown weight bearing     Mobility Bed Mobility Overal bed mobility: Needs Assistance Bed Mobility: Supine to Sit     Supine to sit: Mod assist     General bed mobility comments: as per PT note    Transfers Overall transfer level: Needs assistance Equipment used: Rolling walker (2 wheels) Transfers: Sit to/from Stand, Bed to chair/wheelchair/BSC Sit to Stand: Mod assist     Step  pivot transfers: Mod assist     General transfer comment: as per PT note      Balance Overall balance assessment: Needs assistance Sitting-balance support: Feet supported, No upper extremity supported Sitting balance-Leahy Scale: Fair Sitting balance - Comments: fair/good seated at EOB   Standing balance support: Reliant on assistive  device for balance, During functional activity, Bilateral upper extremity supported Standing balance-Leahy Scale: Poor Standing balance comment: using RW                           ADL either performed or assessed with clinical judgement   ADL Overall ADL's : Needs assistance/impaired     Grooming: Moderate assistance;Minimal assistance;Sitting   Upper Body Bathing: Minimal assistance;Sitting;Moderate assistance   Lower Body Bathing: Maximal assistance;Total assistance;Sitting/lateral leans   Upper Body Dressing : Minimal assistance;Moderate assistance;Sitting   Lower Body Dressing: Maximal assistance;Total assistance;Sitting/lateral leans Lower Body Dressing Details (indicate cue type and reason): Assisted to don socks seated at EOB. Toilet Transfer: Moderate assistance;Rolling walker (2 wheels);Stand-pivot;BSC/3in1 Statistician Details (indicate cue type and reason): EOB to chair transfer; chair to [facility] Toileting- Clothing Manipulation and Hygiene: Maximal assistance;Total assistance;Sitting/lateral lean;Sit to/from stand               Vision Baseline Vision/History: 1 Wears glasses Ability to See in Adequate Light: 1 Impaired Patient Visual Report: No change from baseline Vision Assessment?: No apparent visual deficits     Perception Perception: Not tested       Praxis Praxis: Not tested       Pertinent Vitals/Pain Pain Assessment Pain Assessment: 0-10 Pain Score: 4  Pain Location: R LE Pain Descriptors / Indicators: Sharp Pain Intervention(s): Limited activity within patient's tolerance, Monitored during session, Repositioned     Extremity/Trunk Assessment Upper Extremity Assessment Upper Extremity Assessment:  (2+/5 bilateral shoulder flexion from baseline shoulder issues. Generally weak otherwise.)   Lower Extremity Assessment Lower Extremity Assessment: Defer to PT evaluation RLE Deficits / Details: grossly -3/5 RLE: Unable to fully assess  due to pain;Unable to fully assess due to immobilization RLE Sensation: WNL RLE Coordination: WNL   Cervical / Trunk Assessment Cervical / Trunk Assessment: Kyphotic   Communication Communication Communication: No apparent difficulties   Cognition Arousal: Alert Behavior During Therapy: WFL for tasks assessed/performed Cognition: No apparent impairments                               Following commands: Intact       Cueing  General Comments   Cueing Techniques: Verbal cues                 Home Living Family/patient expects to be discharged to:: Private residence Living Arrangements: Children Available Help at Discharge: Family;Available PRN/intermittently Type of Home: House Home Access: Stairs to enter [employer] of Steps: 1 Entrance Stairs-Rails: None Home Layout: One level     Bathroom Shower/Tub: Producer, television/film/video: Handicapped height (4 inch riser) Bathroom Accessibility: Yes   Home Equipment: Rollator (4 wheels);Cane - single point;Lift chair   Additional Comments: Pt reports family is not physically able to provide much assistance.      Prior Functioning/Environment Prior Level of Function : Needs assist       Physical Assist : ADLs (physical) Mobility (physical): Bed mobility;Transfers;Gait;Stairs ADLs (physical): IADLs Mobility Comments: household ambultion using RW since right knee surgery ADLs Comments: Independent ADL's; assisted  IADL's.    OT Problem List: Decreased strength;Decreased range of motion;Decreased activity tolerance;Impaired balance (sitting and/or standing)   OT Treatment/Interventions: Self-care/ADL training;Therapeutic exercise;Therapeutic activities;Patient/family education;Balance training;DME and/or AE instruction      OT Goals(Current goals can be found in the care plan section)   Acute Rehab OT Goals Patient Stated Goal: improve function OT Goal Formulation: With  patient Time For Goal Achievement: 09/27/23 Potential to Achieve Goals: Good   OT Frequency:  Min 2X/week    Co-evaluation PT/OT/SLP Co-Evaluation/Treatment: Yes Reason for Co-Treatment: To address functional/ADL transfers PT goals addressed during session: Mobility/safety with mobility;Balance;Proper use of DME OT goals addressed during session: ADL's and self-care                       End of Session Equipment Utilized During Treatment: Rolling walker (2 wheels);Gait belt Nurse Communication: Other (comment) (notified pt was having a bowel movement on the [facility])  Activity Tolerance: Patient tolerated treatment well Patient left: Other (comment) (on BSC with call bell within reach)  OT Visit Diagnosis: Unsteadiness on feet (R26.81);Other abnormalities of gait and mobility (R26.89);Muscle weakness (generalized) (M62.81)                Time: 0865-7846 OT Time Calculation (min): 19 min Charges:  OT General Charges $OT Visit: 1 Visit OT Evaluation $OT Eval Low Complexity: 1 Low  Tericka Devincenzi OT, MOT  Thurnell Floss 09/13/2023, 12:55 PM

## 2023-09-13 NOTE — Evaluation (Signed)
 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 Rossie Coon was discharged on 09/03/23.  She had been working with outpatient PT and home health PT.  She has been having very high blood pressures postoperatively.  Her PCP started her on additional blood pressure medication couple of days ago.  Reports that she has had increased swelling in he right knee and leg since 09/11/2023 and was concerned about a blood clot.  She reports that she had been ambulating by the touchdown weightbearing with PT up until about 3 days ago when the swelling and pain became so severe she has not been able to ambulate.  She was advised by orthopedics to come to the ED to rule out a blood clot.  She has been taking tramadol  at home for pain.  DVT ultrasound was negative.  She had x-ray imaging and a CT scan that was demonstrating a nondisplaced distal medial condyle femoral fracture on the right.  This was discussed with [facility] who recommended that this is nonoperative and recommended right knee immobilization with a mobilization recommendation for touchdown weightbearing with a walker.  She is being admitted for pain control as she is having difficulty ambulating and pain has been uncontrolled in addition to elevated blood pressures.  She is being admitted for further management.   Clinical Impression  Patient demonstrates slow labored movement for sitting up at bedside with difficulty moving RLE due to pain, required bed partially raised for completing sit to stands, limited to having to shuffle RLE due to weakness and fair/poor carryover for TDWB and c/o severe pain right  knee. Patient tolerated sitting up on [facility] after therapy - nurse notified. Patient will benefit from continued skilled physical therapy in hospital and recommended venue below to increase strength, balance, endurance for safe ADLs and gait.          If plan is discharge home, recommend the following: A lot of help with bathing/dressing/bathroom;A lot of help with walking and/or transfers;Help with stairs or ramp for entrance;Assistance with cooking/housework   Can travel by private vehicle   No    Equipment Recommendations None recommended by PT  Recommendations for Other Services       Functional Status Assessment Patient has had a recent decline in their functional status and demonstrates the ability to make significant improvements in function in a reasonable and predictable amount of time.     Precautions / Restrictions Precautions Precautions: Fall Recall of Precautions/Restrictions: Intact Required Braces or Orthoses: Knee Immobilizer - Right Knee Immobilizer - Right: On at all times Restrictions Weight Bearing Restrictions Per Provider Order: Yes RLE Weight Bearing Per Provider Order: Touchdown weight bearing      Mobility  Bed Mobility Overal bed mobility: Needs Assistance Bed Mobility: Supine to Sit     Supine to sit: Mod assist     General bed mobility comments: increased time, labored movement with c/o increasing right knee pain    Transfers Overall transfer level: Needs assistance Equipment used: Rolling walker (2 wheels) Transfers: Sit to/from Stand, Bed to chair/wheelchair/BSC Sit to Stand: Mod assist   Step pivot transfers: Mod assist       General transfer comment: unsteady labored movement  Ambulation/Gait Ambulation/Gait assistance: Mod assist, Max assist Gait Distance (Feet): 4 Feet Assistive device: Rolling walker (2 wheels) Gait Pattern/deviations: Decreased step length - right, Decreased step length - left, Decreased stride length,  Shuffle, Trunk flexed Gait velocity: slow     General Gait Details: limited to a few slow labored shuffling side steps with poor tolerance for moving RLE due to increasing pain  Stairs            Wheelchair Mobility     Tilt Bed    Modified Rankin (Stroke Patients Only)       Balance Overall balance assessment: Needs assistance Sitting-balance support: Feet supported, No upper extremity supported Sitting balance-Leahy Scale: Fair Sitting balance - Comments: fair/good seated at EOB   Standing balance support: Reliant on assistive device for balance, During functional activity, Bilateral upper extremity supported Standing balance-Leahy Scale: Poor Standing balance comment: using RW                             Pertinent Vitals/Pain Pain Assessment Pain Assessment: Faces Faces Pain Scale: Hurts little more Pain Location: R knee/thigh Pain Descriptors / Indicators: Sore, Sharp, Grimacing, Discomfort Pain Intervention(s): Limited activity within patient's tolerance, Monitored during session, Repositioned    Home Living Family/patient expects to be discharged to:: Private residence Living Arrangements: Children Available Help at Discharge: Family Type of Home: House Home Access: Stairs to enter Entrance Stairs-Rails: None Entrance Stairs-Number of Steps: 1   Home Layout: One level Home Equipment: Rollator (4 wheels);Cane - single point;Lift chair      Prior Function Prior Level of Function : Needs assist       Physical Assist : Mobility (physical);ADLs (physical) Mobility (physical): Bed mobility;Transfers;Gait;Stairs   Mobility Comments: household ambultion using RW since right knee surgery ADLs Comments: assisted by family     Extremity/Trunk Assessment   Upper Extremity Assessment Upper Extremity Assessment: Defer to OT evaluation    Lower Extremity Assessment Lower Extremity Assessment: Generalized weakness;RLE deficits/detail RLE  Deficits / Details: grossly -3/5 RLE: Unable to fully assess due to pain;Unable to fully assess due to immobilization RLE Sensation: WNL RLE Coordination: WNL    Cervical / Trunk Assessment Cervical / Trunk Assessment: Normal  Communication   Communication Communication: No apparent difficulties    Cognition Arousal: Alert Behavior During Therapy: WFL for tasks assessed/performed   PT - Cognitive impairments: No apparent impairments                         Following commands: Intact       Cueing Cueing Techniques: Verbal cues     General Comments      Exercises     Assessment/Plan    PT Assessment Patient needs continued PT services  PT Problem List Decreased strength;Decreased range of motion;Decreased activity tolerance;Decreased balance;Decreased mobility;Decreased knowledge of use of DME;Pain;Decreased coordination       PT Treatment Interventions DME instruction;Gait training;Stair training;Functional mobility training;Therapeutic activities;Therapeutic exercise;Balance training;Patient/family education    PT Goals (Current goals can be found in the Care Plan section)  Acute Rehab PT Goals Patient Stated Goal: return home after rehab PT Goal Formulation: With patient Time For Goal Achievement: 09/27/23 Potential to Achieve Goals: Good    Frequency Min 3X/week     Co-evaluation PT/OT/SLP Co-Evaluation/Treatment: Yes Reason for Co-Treatment: To address functional/ADL transfers PT goals addressed during session: Mobility/safety with mobility;Balance;Proper use of DME  AM-PAC PT "6 Clicks" Mobility  Outcome Measure Help needed turning from your back to your side while in a flat bed without using bedrails?: A Lot Help needed moving from lying on your back to sitting on the side of a flat bed without using bedrails?: A Lot Help needed moving to and from a bed to a chair (including a wheelchair)?: A Lot Help needed standing up from a  chair using your arms (e.g., wheelchair or bedside chair)?: A Lot Help needed to walk in hospital room?: A Lot Help needed climbing 3-5 steps with a railing? : Total 6 Click Score: 11    End of Session Equipment Utilized During Treatment: Gait belt Activity Tolerance: Patient tolerated treatment well;Patient limited by fatigue Patient left: with call bell/phone within reach;Other (comment) (Patient left  seated on [facility]) Nurse Communication: Mobility status PT Visit Diagnosis: Unsteadiness on feet (R26.81);Other abnormalities of gait and mobility (R26.89);Muscle weakness (generalized) (M62.81) Pain - Right/Left: Right Pain - part of body: Knee    Time: 5784-6962 PT Time Calculation (min) (ACUTE ONLY): 20 min   Charges:   PT Evaluation $PT Eval Moderate Complexity: 1 Mod PT Treatments $Therapeutic Activity: 8-22 mins PT General Charges $$ ACUTE PT VISIT: 1 Visit         12:17 PM, 09/13/23 Walton Guppy, MPT Physical Therapist with [facility] 336 [phone] office [phone] mobile phone

## 2023-09-13 NOTE — Plan of Care (Signed)

## 2023-09-14 DIAGNOSIS — I10 Essential (primary) hypertension: Secondary | ICD-10-CM | POA: Diagnosis not present

## 2023-09-14 DIAGNOSIS — E119 Type 2 diabetes mellitus without complications: Secondary | ICD-10-CM | POA: Diagnosis not present

## 2023-09-14 DIAGNOSIS — S72434D Nondisplaced fracture of medial condyle of right femur, subsequent encounter for closed fracture with routine healing: Secondary | ICD-10-CM | POA: Diagnosis not present

## 2023-09-14 DIAGNOSIS — R52 Pain, unspecified: Secondary | ICD-10-CM | POA: Diagnosis not present

## 2023-09-14 LAB — GLUCOSE, CAPILLARY
Glucose-Capillary: 80 mg/dL (ref 70–99)
Glucose-Capillary: 131 mg/dL — ABNORMAL HIGH (ref 70–99)
Glucose-Capillary: 198 mg/dL — ABNORMAL HIGH (ref 70–99)
Glucose-Capillary: 104 mg/dL — ABNORMAL HIGH (ref 70–99)
Glucose-Capillary: 52 mg/dL — ABNORMAL LOW (ref 70–99)
Glucose-Capillary: 59 mg/dL — ABNORMAL LOW (ref 70–99)
Glucose-Capillary: 69 mg/dL — ABNORMAL LOW (ref 70–99)
Glucose-Capillary: 84 mg/dL (ref 70–99)

## 2023-09-14 LAB — CBC
HCT: 25.9 % — ABNORMAL LOW (ref 36.0–46.0)
Hemoglobin: 8.1 g/dL — ABNORMAL LOW (ref 12.0–15.0)
MCH: 34 pg (ref 26.0–34.0)
MCHC: 31.3 g/dL (ref 30.0–36.0)
MCV: 108.8 fL — ABNORMAL HIGH (ref 80.0–100.0)
Platelets: 305 10*3/uL (ref 150–400)
RBC: 2.38 MIL/uL — ABNORMAL LOW (ref 3.87–5.11)
RDW: 17.2 % — ABNORMAL HIGH (ref 11.5–15.5)
WBC: 8.4 10*3/uL (ref 4.0–10.5)
nRBC: 0.4 % — ABNORMAL HIGH (ref 0.0–0.2)

## 2023-09-14 LAB — RETICULOCYTES
Immature Retic Fract: 27.8 % — ABNORMAL HIGH (ref 2.3–15.9)
RBC.: 2.43 MIL/uL — ABNORMAL LOW (ref 3.87–5.11)
Retic Count, Absolute: 147.3 10*3/uL (ref 19.0–186.0)
Retic Ct Pct: 6.1 % — ABNORMAL HIGH (ref 0.4–3.1)

## 2023-09-14 LAB — FERRITIN: Ferritin: 107 ng/mL (ref 11–307)

## 2023-09-14 LAB — IRON AND TIBC
Iron: 37 ug/dL (ref 28–170)
Saturation Ratios: 13 % (ref 10.4–31.8)
TIBC: 286 ug/dL (ref 250–450)
UIBC: 249 ug/dL

## 2023-09-14 LAB — VITAMIN B12: Vitamin B-12: 432 pg/mL (ref 180–914)

## 2023-09-14 LAB — FOLATE: Folate: 8.3 ng/mL (ref >5.9)

## 2023-09-14 MED ORDER — AMLODIPINE BESYLATE 5 MG PO TABS
10.0000 mg | ORAL_TABLET | Freq: Every day | ORAL | Status: DC
  Administered 2023-09-14 – 2023-09-16 (×3): 10 mg via ORAL
  Filled 2023-09-14 (×3): qty 2

## 2023-09-14 MED ORDER — PROCHLORPERAZINE EDISYLATE 10 MG/2ML IJ SOLN: 10.0000 mg | INTRAMUSCULAR | Status: DC | PRN

## 2023-09-14 NOTE — Plan of Care (Signed)

## 2023-09-14 NOTE — Progress Notes (Signed)
 Pt BS: 52 @t  2210, charge nurse notified.   New orders placed for 8 oz orange and peanut butter crackers given.   BS re-checked: 69 @t  2230 (fifteen minutes): gave pt vanilla ice cream. Pt stated that works to bring her blood sugar levels up.   (Re-checked) @t  2251 BS: 84. Pt tolerated well. Pt states she feels well, will continue to monitor.

## 2023-09-14 NOTE — Progress Notes (Signed)
 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 France Ina was discharged on 09/03/23.  She had been working with outpatient PT and home health PT.  She has been having very high blood pressures postoperatively.  Her PCP started her on additional blood pressure medication couple of days ago.  Reports that she has had increased swelling in he right knee and leg since 09/11/2023 and was concerned about a blood clot.  She reports that she had been ambulating by the touchdown weightbearing with PT up until about 3 days ago when the swelling and pain became so severe she has not been able to ambulate.  She was advised by orthopedics to come to the ED to rule out a blood clot.  She has been taking tramadol  at home for pain.  DVT ultrasound was negative.  She had x-ray imaging and a CT scan that was demonstrating a nondisplaced distal medial condyle femoral fracture on the right.  This was discussed with [facility] who recommended that this is nonoperative and recommended right knee immobilization with a mobilization recommendation for touchdown weightbearing with a walker.  She is being admitted for pain control as she is having difficulty ambulating and pain has been uncontrolled in addition to elevated blood pressures.  She is being admitted for further management.   Assessment and Plan:  Closed right distal medial condyle femur fracture S/p recent total right knee replacement 09/02/23  - discussed with [facility], this is a nonoperative fracture - they recommended pain control admission as needed  and outpatient follow up - they recommended TDWB with walker and 1-2 person assist - pt to follow up with Dr. France Ina next week as scheduled (Wed) - PT evaluation recommending SNF -- Pt to go to [facility] on 09/16/23     Uncontrolled pain - Improving  - schedule acetaminophen  1000 mg every 6 hours  - oxycodone  5 mg every 4 hours PRN moderate pain - fentanyl  50 mcg every 2 hours PRN severe pain - schedule stool softeners  - added Zofran  IV for nausea symptoms    Uncontrolled hypertension - pt is having very high BP readings likely exacerbated by acute pain - added amlodipine , increasing hydralazine  to 50 mg TID, IV hydralazine  ordered - working on pain control as noted above - further titrations as needed   Acute blood loss anemia - Hg down to 7.5  - likely bleeding from acute fracture - monitoring Hg, recheck CBC in AM  - transfuse for Hg<7   Type 2 DM, uncontrolled  - as evidenced by A1c of 7.6%  - we have resumed home 70/30 insulin  but reduced dose slightly to 45 units BID with meals - monitoring CBG 5 times per day  CBG (last 3)  Recent Labs    09/14/23 0246 09/14/23 0811 09/14/23 1114  GLUCAP 80 131* 198*   Hypothyroidism - resume home levothyroxine  112 mcg daily    Allergic Rhinitis - resume home loratadine  daily    Acquired thrombophilia - apixaban  5 mg BID resumed   Depression/Anxiety - resume home citalopram  10 mg   DVT prophylaxis: apixaban   Code Status: Full  Family  Communication:  Disposition: anticipating SNF rehab on 09/16/23 [facility])  Consultants:  PT/OT   Procedures:   Antimicrobials:    Subjective: Pt was having pain and nausea with working with PT yesterday but pain is much better at rest, pt willing to go to rehab and have been paying close attention to her blood pressures.    Objective: Vitals:   09/13/23 2023 09/14/23 0543 09/14/23 0916 09/14/23 1338  BP: (!) 178/41 (!) 159/38 (!) 164/35 (!) 149/40  Pulse: 72 66 70 63   Resp: 17 17 17 20   Temp: 98.6 F (37 C) 97.7 F (36.5 C)  98 F (36.7 C)  TempSrc: Oral Oral  Oral  SpO2: 96% 97% 94% 96%  Weight:      Height:        Intake/Output Summary (Last 24 hours) at 09/14/2023 1340 Last data filed at 09/14/2023 0552 Gross per 24 hour  Intake 240 ml  Output 800 ml  Net -560 ml   Filed Weights   09/12/23 0839  Weight: 103.4 kg   Examination:  General exam: Appears calm and comfortable  Respiratory system: Clear to auscultation. Respiratory effort normal. Cardiovascular system: normal S1 & S2 heard. No JVD, murmurs, rubs, gallops or clicks. No pedal edema. Gastrointestinal system: Abdomen is nondistended, soft and nontender. No organomegaly or masses felt. Normal bowel sounds heard. Central nervous system: Alert and oriented. No focal neurological deficits. Extremities: right knee in immobilizer, large bruise under right patella area unchanged. Skin: No rashes, lesions or ulcers. Psychiatry: Judgement and insight appear normal. Mood & affect appropriate.   Data Reviewed: I have personally reviewed following labs and imaging studies  CBC: Recent Labs  Lab 09/12/23 0855 09/13/23 0400 09/13/23 1352 09/14/23 0436  WBC 10.7* 7.7  --  8.4  NEUTROABS 7.8*  --   --   --   HGB 9.0* 7.5* 8.2* 8.1*  HCT 27.1* 23.8* 25.3* 25.9*  MCV 106.7* 107.2*  --  108.8*  PLT 338 276  --  305    Basic Metabolic Panel: Recent Labs  Lab 09/12/23 0855 09/13/23 0400  NA 135 134*  K 3.6 3.8  CL 97* 99  CO2 29 30  GLUCOSE 95 115*  BUN 20 21  CREATININE 1.21* 1.28*  CALCIUM 8.9 8.0*  MG  --  1.9    CBG: Recent Labs  Lab 09/13/23 1654 09/13/23 2020 09/14/23 0246 09/14/23 0811 09/14/23 1114  GLUCAP 83 147* 80 131* 198*    No results found for this or any previous visit (from the past 240 hours).   Radiology Studies: No results found.   Scheduled Meds:  acetaminophen   1,000 mg Oral Q6H   acidophilus  1 capsule Oral q AM   amLODipine   10 mg  Oral Daily   apixaban   5 mg Oral BID   citalopram   10 mg Oral Q M,W,F   docusate sodium   100 mg Oral BID   hydrALAZINE   50 mg Oral Q8H   hydrochlorothiazide   25 mg Oral q AM   insulin  aspart  0-15 Units Subcutaneous TID WC   insulin  aspart  0-5 Units Subcutaneous QHS   insulin  aspart protamine- aspart  45 Units Subcutaneous BID WC   irbesartan   150 mg Oral Daily   levothyroxine   112 mcg Oral QAC breakfast   loratadine   10 mg Oral q AM   polyethylene glycol  17 g Oral Daily   Continuous Infusions:   LOS: 1 day   Time spent: 50  mins  Faustino Hook, MD How to contact the [facility] Attending or Consulting provider 7A - 7P or covering provider during after hours 7P -7A, for this patient?  Check the care team in [facility] and look for a) attending/consulting TR[facility] provider listed and b) the [facility] team listed Log into [email or web address] to find provider on call.  Locate the [facility] provider you are looking for under [facility] and page to a number that you can be directly reached. If you still have difficulty reaching the provider, please page the [facility] (Director on Call) for the Hospitalists listed on amion for assistance.  09/14/2023, 1:40 PM

## 2023-09-15 DIAGNOSIS — S72434D Nondisplaced fracture of medial condyle of right femur, subsequent encounter for closed fracture with routine healing: Secondary | ICD-10-CM | POA: Diagnosis not present

## 2023-09-15 DIAGNOSIS — R52 Pain, unspecified: Secondary | ICD-10-CM | POA: Diagnosis not present

## 2023-09-15 DIAGNOSIS — I10 Essential (primary) hypertension: Secondary | ICD-10-CM | POA: Diagnosis not present

## 2023-09-15 DIAGNOSIS — E119 Type 2 diabetes mellitus without complications: Secondary | ICD-10-CM | POA: Diagnosis not present

## 2023-09-15 LAB — GLUCOSE, CAPILLARY
Glucose-Capillary: 116 mg/dL — ABNORMAL HIGH (ref 70–99)
Glucose-Capillary: 102 mg/dL — ABNORMAL HIGH (ref 70–99)
Glucose-Capillary: 107 mg/dL — ABNORMAL HIGH (ref 70–99)
Glucose-Capillary: 127 mg/dL — ABNORMAL HIGH (ref 70–99)
Glucose-Capillary: 158 mg/dL — ABNORMAL HIGH (ref 70–99)

## 2023-09-15 MED ORDER — INSULIN ASPART PROT & ASPART (70-30 MIX) 100 UNIT/ML ~~LOC~~ SUSP
25.0000 [IU] | Freq: Two times a day (BID) | SUBCUTANEOUS | Status: DC
  Administered 2023-09-15 – 2023-09-16 (×3): 25 [IU] via SUBCUTANEOUS

## 2023-09-15 MED ORDER — HYDRALAZINE HCL 25 MG PO TABS
75.0000 mg | ORAL_TABLET | Freq: Three times a day (TID) | ORAL | Status: DC
  Administered 2023-09-15 – 2023-09-16 (×3): 75 mg via ORAL
  Filled 2023-09-15 (×3): qty 3

## 2023-09-15 NOTE — Progress Notes (Signed)
 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 France Ina was discharged on 09/03/23.  She had been working with outpatient PT and home health PT.  She has been having very high blood pressures postoperatively.  Her PCP started her on additional blood pressure medication couple of days ago.  Reports that she has had increased swelling in he right knee and leg since 09/11/2023 and was concerned about a blood clot.  She reports that she had been ambulating by the touchdown weightbearing with PT up until about 3 days ago when the swelling and pain became so severe she has not been able to ambulate.  She was advised by orthopedics to come to the ED to rule out a blood clot.  She has been taking tramadol  at home for pain.  DVT ultrasound was negative.  She had x-ray imaging and a CT scan that was demonstrating a nondisplaced distal medial condyle femoral fracture on the right.  This was discussed with [facility] who recommended that this is nonoperative and recommended right knee immobilization with a mobilization recommendation for touchdown weightbearing with a walker.  She is being admitted for pain control as she is having difficulty ambulating and pain has been uncontrolled in addition to elevated blood pressures.  She is being admitted for further management.   Assessment and Plan:  Closed right distal medial condyle femur fracture S/p recent total right knee replacement 09/02/23  - discussed with [facility], this is a nonoperative fracture - they recommended pain control admission as needed  and outpatient follow up - they recommended TDWB with walker and 1-2 person assist - pt to follow up with Dr. France Ina next week as scheduled (Wed) - PT evaluation recommending SNF -- Pt to go to [facility] on 09/16/23     Uncontrolled pain - Improving  - schedule acetaminophen  1000 mg every 6 hours  - oxycodone  5 mg every 4 hours PRN moderate pain - fentanyl  50 mcg every 2 hours PRN severe pain - schedule stool softeners  - added Zofran  IV for nausea symptoms    Uncontrolled hypertension - pt is having very high BP readings likely exacerbated by acute pain - added amlodipine , increasing hydralazine  to 50 mg TID, IV hydralazine  ordered - working on pain control as noted above - further titrations as needed   Acute blood loss anemia - Hg down to 7.5  - likely bleeding from acute fracture - monitoring Hg, recheck CBC in AM  - transfuse for Hg<7   Type 2 DM, uncontrolled  - as evidenced by A1c of 7.6%  - we have resumed home 70/30 insulin  and initially reduced dose to 45 units BID with meals, now reduced to 25 units  - monitoring CBG 5 times per day  CBG (last 3)  Recent Labs    09/15/23 0337 09/15/23 0636 09/15/23 0736  GLUCAP 116* 102* 107*   Hypoglycemia from insulin  - treated and resolved now - reduced 70/30 insulin  dose to 25 units  - continue frequent CBG monitoring  Hypothyroidism - resume home levothyroxine  112 mcg daily    Allergic Rhinitis - resume home loratadine  daily  Acquired thrombophilia - apixaban  5 mg BID resumed   Depression/Anxiety - resume home citalopram  10 mg   DVT prophylaxis: apixaban   Code Status: Full  Family Communication:  Disposition: anticipating SNF rehab on 09/16/23 [facility])  Consultants:  PT/OT   Procedures:   Antimicrobials:    Subjective: Pt feeling better now after BS improved, having had some hypoglycemia yesterday, has not been eating like normal since being in hospital   Objective: Vitals:   09/14/23  1338 09/14/23 2204 09/15/23 0607 09/15/23 0607  BP: (!) 149/40 (!) 170/46 (!) 167/41 (!) 167/41  Pulse: 63 67  64  Resp: 20 16  16   Temp: 98 F (36.7 C) (!) 97.5 F (36.4 C)  97.6 F (36.4 C)  TempSrc: Oral Oral  Oral  SpO2: 96% 98%  98%  Weight:      Height:        Intake/Output Summary (Last 24 hours) at 09/15/2023 0904 Last data filed at 09/14/2023 2202 Gross per 24 hour  Intake 480 ml  Output 750 ml  Net -270 ml   Filed Weights   09/12/23 0839  Weight: 103.4 kg   Examination:  General exam: Appears calm and comfortable  Respiratory system: Clear to auscultation. Respiratory effort normal. Cardiovascular system: normal S1 & S2 heard. No JVD, murmurs, rubs, gallops or clicks. No pedal edema. Gastrointestinal system: Abdomen is nondistended, soft and nontender. No organomegaly or masses felt. Normal bowel sounds heard. Central nervous system: Alert and oriented. No focal neurological deficits. Extremities: right knee in immobilizer, large bruise under right patella area unchanged. Skin: No rashes, lesions or ulcers. Psychiatry: Judgement and insight appear normal. Mood & affect appropriate.   Data Reviewed: I have personally reviewed following labs and imaging studies  CBC: Recent Labs  Lab 09/12/23 0855 09/13/23 0400 09/13/23 1352 09/14/23 0436  WBC 10.7* 7.7  --  8.4  NEUTROABS 7.8*  --   --   --   HGB 9.0* 7.5* 8.2* 8.1*  HCT 27.1* 23.8* 25.3* 25.9*  MCV 106.7* 107.2*  --  108.8*  PLT 338 276  --  305    Basic Metabolic Panel: Recent Labs  Lab 09/12/23 0855 09/13/23 0400  NA 135 134*  K 3.6 3.8  CL 97* 99  CO2 29 30  GLUCOSE 95 115*  BUN 20 21  CREATININE 1.21* 1.28*  CALCIUM 8.9 8.0*  MG  --  1.9    CBG: Recent Labs  Lab 09/14/23 2230 09/14/23 2253 09/15/23 0337 09/15/23 0636 09/15/23 0736  GLUCAP 69* 84 116* 102* 107*    No results found for this or any previous visit (from the past 240 hours).   Radiology Studies: No results  found.   Scheduled Meds:  acetaminophen   1,000 mg Oral Q6H   acidophilus  1 capsule Oral q AM   amLODipine   10 mg Oral Daily   apixaban   5 mg Oral BID   citalopram   10 mg Oral Q M,W,F   docusate sodium   100 mg Oral BID   hydrALAZINE   75 mg Oral Q8H   hydrochlorothiazide   25 mg Oral q AM   insulin  aspart  0-15 Units Subcutaneous TID WC   insulin  aspart  0-5 Units Subcutaneous QHS   insulin  aspart protamine- aspart  25 Units Subcutaneous BID WC   irbesartan   150 mg Oral Daily   levothyroxine   112 mcg Oral QAC breakfast   loratadine   10 mg Oral q AM   polyethylene glycol  17 g  Oral Daily   Continuous Infusions:   LOS: 2 days   Time spent: 55 mins  Merritt Mccravy Lincoln Renshaw, MD How to contact the [facility] Attending or Consulting provider 7A - 7P or covering provider during after hours 7P -7A, for this patient?  Check the care team in [facility] and look for a) attending/consulting [facility] provider listed and b) the [facility] team listed Log into [email or web address] to find provider on call.  Locate the [facility] provider you are looking for under [facility] and page to a number that you can be directly reached. If you still have difficulty reaching the provider, please page the [facility] (Director on Call) for the Hospitalists listed on amion for assistance.  09/15/2023, 9:04 AM

## 2023-09-15 NOTE — Plan of Care (Signed)
  Problem: Education: Goal: Knowledge of General Education information will improve Description: Including pain rating scale, medication(s)/side effects and non-pharmacologic comfort measures Outcome: Progressing   Problem: Health Behavior/Discharge Planning: Goal: Ability to manage health-related needs will improve Outcome: Progressing   Problem: Clinical Measurements: Goal: Ability to maintain clinical measurements within normal limits will improve Outcome: Progressing Goal: Will remain free from infection Outcome: Progressing Goal: Diagnostic test results will improve Outcome: Progressing   Problem: Activity: Goal: Risk for activity intolerance will decrease Outcome: Progressing   Problem: Nutrition: Goal: Adequate nutrition will be maintained Outcome: Progressing   Problem: Elimination: Goal: Will not experience complications related to bowel motility Outcome: Progressing Goal: Will not experience complications related to urinary retention Outcome: Progressing   Problem: Pain Managment: Goal: General experience of comfort will improve and/or be controlled Outcome: Progressing   Problem: Safety: Goal: Ability to remain free from injury will improve Outcome: Progressing   Problem: Skin Integrity: Goal: Risk for impaired skin integrity will decrease Outcome: Progressing   Problem: Coping: Goal: Ability to adjust to condition or change in health will improve Outcome: Progressing   Problem: Fluid Volume: Goal: Ability to maintain a balanced intake and output will improve Outcome: Progressing   Problem: Health Behavior/Discharge Planning: Goal: Ability to manage health-related needs will improve Outcome: Progressing   Problem: Metabolic: Goal: Ability to maintain appropriate glucose levels will improve Outcome: Progressing   Problem: Skin Integrity: Goal: Risk for impaired skin integrity will decrease Outcome: Progressing   Problem: Tissue  Perfusion: Goal: Adequacy of tissue perfusion will improve Outcome: Progressing

## 2023-09-16 DIAGNOSIS — I441 Atrioventricular block, second degree: Secondary | ICD-10-CM | POA: Diagnosis not present

## 2023-09-16 DIAGNOSIS — M1711 Unilateral primary osteoarthritis, right knee: Secondary | ICD-10-CM | POA: Diagnosis not present

## 2023-09-16 DIAGNOSIS — D62 Acute posthemorrhagic anemia: Secondary | ICD-10-CM | POA: Diagnosis not present

## 2023-09-16 DIAGNOSIS — E1169 Type 2 diabetes mellitus with other specified complication: Secondary | ICD-10-CM | POA: Diagnosis not present

## 2023-09-16 DIAGNOSIS — D649 Anemia, unspecified: Secondary | ICD-10-CM | POA: Diagnosis not present

## 2023-09-16 DIAGNOSIS — Z950 Presence of cardiac pacemaker: Secondary | ICD-10-CM | POA: Diagnosis not present

## 2023-09-16 DIAGNOSIS — Z5189 Encounter for other specified aftercare: Secondary | ICD-10-CM | POA: Diagnosis not present

## 2023-09-16 DIAGNOSIS — Z794 Long term (current) use of insulin: Secondary | ICD-10-CM | POA: Diagnosis not present

## 2023-09-16 DIAGNOSIS — R600 Localized edema: Secondary | ICD-10-CM | POA: Diagnosis not present

## 2023-09-16 DIAGNOSIS — K5909 Other constipation: Secondary | ICD-10-CM | POA: Diagnosis not present

## 2023-09-16 DIAGNOSIS — E1122 Type 2 diabetes mellitus with diabetic chronic kidney disease: Secondary | ICD-10-CM | POA: Diagnosis not present

## 2023-09-16 DIAGNOSIS — R488 Other symbolic dysfunctions: Secondary | ICD-10-CM | POA: Diagnosis not present

## 2023-09-16 DIAGNOSIS — D6869 Other thrombophilia: Secondary | ICD-10-CM | POA: Diagnosis not present

## 2023-09-16 DIAGNOSIS — J309 Allergic rhinitis, unspecified: Secondary | ICD-10-CM | POA: Diagnosis not present

## 2023-09-16 DIAGNOSIS — I480 Paroxysmal atrial fibrillation: Secondary | ICD-10-CM | POA: Diagnosis not present

## 2023-09-16 DIAGNOSIS — E1159 Type 2 diabetes mellitus with other circulatory complications: Secondary | ICD-10-CM | POA: Diagnosis not present

## 2023-09-16 DIAGNOSIS — I152 Hypertension secondary to endocrine disorders: Secondary | ICD-10-CM | POA: Diagnosis not present

## 2023-09-16 DIAGNOSIS — S72434D Nondisplaced fracture of medial condyle of right femur, subsequent encounter for closed fracture with routine healing: Secondary | ICD-10-CM | POA: Diagnosis not present

## 2023-09-16 DIAGNOSIS — I10 Essential (primary) hypertension: Secondary | ICD-10-CM | POA: Diagnosis not present

## 2023-09-16 DIAGNOSIS — N183 Chronic kidney disease, stage 3 unspecified: Secondary | ICD-10-CM | POA: Diagnosis not present

## 2023-09-16 DIAGNOSIS — S22080D Wedge compression fracture of T11-T12 vertebra, subsequent encounter for fracture with routine healing: Secondary | ICD-10-CM | POA: Diagnosis not present

## 2023-09-16 DIAGNOSIS — Z9181 History of falling: Secondary | ICD-10-CM | POA: Diagnosis not present

## 2023-09-16 DIAGNOSIS — R278 Other lack of coordination: Secondary | ICD-10-CM | POA: Diagnosis not present

## 2023-09-16 DIAGNOSIS — Z96651 Presence of right artificial knee joint: Secondary | ICD-10-CM | POA: Diagnosis not present

## 2023-09-16 DIAGNOSIS — E119 Type 2 diabetes mellitus without complications: Secondary | ICD-10-CM | POA: Diagnosis not present

## 2023-09-16 DIAGNOSIS — R262 Difficulty in walking, not elsewhere classified: Secondary | ICD-10-CM | POA: Diagnosis not present

## 2023-09-16 DIAGNOSIS — R52 Pain, unspecified: Secondary | ICD-10-CM | POA: Diagnosis not present

## 2023-09-16 DIAGNOSIS — J3089 Other allergic rhinitis: Secondary | ICD-10-CM | POA: Diagnosis not present

## 2023-09-16 DIAGNOSIS — M6281 Muscle weakness (generalized): Secondary | ICD-10-CM | POA: Diagnosis not present

## 2023-09-16 DIAGNOSIS — E039 Hypothyroidism, unspecified: Secondary | ICD-10-CM | POA: Diagnosis not present

## 2023-09-16 DIAGNOSIS — I700 Atherosclerosis of aorta: Secondary | ICD-10-CM | POA: Diagnosis not present

## 2023-09-16 LAB — GLUCOSE, CAPILLARY
Glucose-Capillary: 88 mg/dL (ref 70–99)
Glucose-Capillary: 121 mg/dL — ABNORMAL HIGH (ref 70–99)
Glucose-Capillary: 117 mg/dL — ABNORMAL HIGH (ref 70–99)

## 2023-09-16 MED ORDER — HYDRALAZINE HCL 25 MG PO TABS: 75.0000 mg | ORAL_TABLET | Freq: Three times a day (TID) | ORAL | Status: DC

## 2023-09-16 MED ORDER — METHOCARBAMOL 500 MG PO TABS: 500.0000 mg | ORAL_TABLET | Freq: Three times a day (TID) | ORAL | Status: DC | PRN

## 2023-09-16 MED ORDER — ACETAMINOPHEN 500 MG PO TABS: 1000.0000 mg | ORAL_TABLET | Freq: Four times a day (QID) | ORAL | Status: DC

## 2023-09-16 MED ORDER — HYDRALAZINE HCL 25 MG PO TABS
100.0000 mg | ORAL_TABLET | Freq: Three times a day (TID) | ORAL | Status: DC
  Filled 2023-09-16: qty 4

## 2023-09-16 MED ORDER — DOCUSATE SODIUM 100 MG PO CAPS: 100.0000 mg | ORAL_CAPSULE | Freq: Two times a day (BID) | ORAL | Status: DC

## 2023-09-16 MED ORDER — AMLODIPINE BESYLATE 5 MG PO TABS: 5.0000 mg | ORAL_TABLET | Freq: Every day | ORAL | Status: DC

## 2023-09-16 MED ORDER — HUMULIN 70/30 (70-30) 100 UNIT/ML ~~LOC~~ SUSP: 20.0000 [IU] | Freq: Two times a day (BID) | SUBCUTANEOUS | Status: DC

## 2023-09-16 MED ORDER — OXYCODONE HCL 5 MG PO TABS: 5.0000 mg | ORAL_TABLET | Freq: Four times a day (QID) | ORAL | 0 refills | Status: DC | PRN

## 2023-09-16 MED ORDER — POLYETHYLENE GLYCOL 3350 17 G PO PACK: 17.0000 g | PACK | Freq: Every day | ORAL | Status: DC

## 2023-09-16 NOTE — TOC Transition Note (Signed)
 Transition of Care [facility]) - Discharge Note   Patient Details  Name: Alicia [name] MRN: [id number] Date of Birth: Mar 28, 1946  [facility]) CM/SW Contact:  Orelia Binet, RN Phone Number: 09/16/2023, 2:03 PM   Clinical Narrative:   Siegfried Dress received, Patient discharging to [facility]. Kerri provided room number, RN calling report. Daughter updated. DC summary completed.    Final next level of care: Skilled Nursing Facility Barriers to Discharge: Barriers Resolved   Patient Goals and CMS Choice Patient states their goals for this hospitalization and ongoing recovery are:: rehab CMS Medicare.gov Compare Post Acute Care list provided to:: Patient Choice offered to / list presented to : Patient  ownership interest in [facility].provided to:: Patient    Discharge Placement                Patient to be transferred to facility by: staff Name of family member notified: Daughter Patient and family notified of of transfer: 09/16/23  Discharge Plan and Services Additional resources added to the After Visit Summary for   In-house Referral: Clinical Social Work Discharge Planning Services: CM Consult Post Acute Care Choice: Skilled Nursing Facility                      [facility] Agency: Other - See comment (Patient was doing OPPT at [facility] until they started coming into the home)        Social Drivers of Health (SDOH) Interventions SDOH Screenings   Food Insecurity: No Food Insecurity (09/12/2023)  Housing: Low Risk  (09/12/2023)  Transportation Needs: No Transportation Needs (09/12/2023)  Utilities: Not At Risk (09/12/2023)  Social Connections: Moderately Isolated (09/12/2023)  Tobacco Use: Low Risk  (09/12/2023)     Readmission Risk Interventions    09/16/2023    2:03 PM  Readmission Risk Prevention Plan  Transportation Screening Complete  PCP or Specialist Appt within 5-7 Days Complete  Home Care Screening Complete  Medication Review  (RN CM) Complete

## 2023-09-16 NOTE — Care Management Important Message (Signed)
 Important Message  Patient Details  Name: Alicia [name] MRN: [id number] Date of Birth: [birth year]   Important Message Given:  Yes - Medicare IM     Starsky Nanna L Braniya Farrugia 09/16/2023, 11:48 AM

## 2023-09-16 NOTE — Discharge Summary (Addendum)
 Physician Discharge Summary  Alicia [name] ZOX:[id number] DOB: [birth year] DOA: 09/12/2023  PCP: Leesa Pulling, MD Orthopedics: Dr. France Ina  Admit date: 09/12/2023 Discharge date: 09/16/2023  Admitted From:  Home  Disposition:  SNF Rehab  Recommendations for Outpatient Follow-up:  Follow up with Dr. France Ina on Wed 09/18/23 as scheduled (orthopedics) Follow up with PCP in 2 weeks  Please check BMP/CBC in 1 week to follow hemoglobin and renal function Please monitor blood sugars at least 4 times per day Titrate insulin  doses and blood pressure medications as needed for better control  Home Health:  SNF   Discharge Condition: STABLE   CODE STATUS: FULL DIET: 2 gram sodium restriction, carb modified    Brief Hospitalization Summary: Please see all hospital notes, images, labs for full details of the hospitalization. 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 France Ina was discharged on 09/03/23.  She had been working with outpatient PT and home health PT.  She has been having very high blood pressures postoperatively.  Her PCP started her on additional blood pressure medication couple of days ago.  Reports that she has had increased swelling in he right knee and leg since 09/11/2023 and was concerned about a blood clot.  She reports that she had been ambulating by the touchdown weightbearing with PT up until about 3 days ago when the swelling and pain became so severe she has not been able to ambulate.  She was advised by orthopedics to come to the ED to rule out a blood clot.  She has been taking tramadol  at home for pain.  DVT ultrasound was negative.  She had x-ray imaging and a CT scan that was demonstrating a nondisplaced distal medial condyle femoral fracture on the right.  This was  discussed with [facility] who recommended that this is nonoperative and recommended right knee immobilization with a mobilization recommendation for touchdown weightbearing with a walker.  She is being admitted for pain control as she is having difficulty ambulating and pain has been uncontrolled in addition to elevated blood pressures.  She is being admitted for further management.  Hospital Course by listed problems addressed  Closed right distal medial condyle femur fracture S/p recent total right knee replacement 09/02/23  - discussed with [facility], this is a nonoperative fracture - they recommended pain control admission as needed and outpatient follow up - they recommended TDWB with walker and 1-2 person assist - pt to follow up with Dr. France Ina 09/18/23 as scheduled (Wed) - PT evaluation recommending SNF -- Pt to go to [facility] on 09/16/23     Uncontrolled pain - Much Improved With Treatments - scheduled acetaminophen  1000 mg every 6 hours  - oxycodone  5-7.5 mg every 4 hours PRN moderate/severe pain - schedule stool softeners    Uncontrolled hypertension - IMPROVING - pt was having very high BP readings likely exacerbated by acute pain - added amlodipine , hydralazine  75 mg TID with improved BP readings - working on pain control as noted above - further titrations as needed on outpatient basis   Acute blood loss anemia - Hg stable at 8.1   - likely some bleeding from acute fracture - monitored Hg, recheck CBC in 1 week    Type 2 DM, uncontrolled  - as evidenced by A1c of 7.6%  - we have resumed home 70/30  insulin  and initially reduced dose to 45 units BID with meals, now reduced to 25 units  - monitoring CBG 5 times per day CBG (last 3)  Recent Labs    09/15/23 1605 09/16/23 0322 09/16/23 0716  GLUCAP 158* 88 121*    Hypoglycemia from insulin  - treated and resolved now - reduced 70/30 insulin  dose to 20 units BID with meals - continue frequent CBG  monitoring   Hypothyroidism - resume home levothyroxine  112 mcg daily    Allergic Rhinitis - resume home loratadine  daily    Acquired thrombophilia - apixaban  5 mg BID resumed   Depression/Anxiety - resume home citalopram  10 mg    Discharge Diagnoses:  Principal Problem:   Closed fracture of medial condyle of right femur (HCC) Active Problems:   Uncontrolled pain   Second degree Mobitz II AV block   Uncontrolled hypertension   A-fib (HCC)   Pacemaker   Type 2 diabetes mellitus without complication (HCC)   Hypothyroidism   Discharge Instructions:  Allergies as of 09/16/2023       Reactions   Ace Inhibitors Cough   Cardizem  [diltiazem  Hcl] Other (See Comments)   headache   Misc. Sulfonamide Containing Compounds Other (See Comments)   Oxycodone  Nausea Only   Statins Other (See Comments)   myalgias   Sulfa Antibiotics Hives, Swelling   Tape    Paper tape is okay   Aspirin  Rash   Cortisone Rash   flushing   Fd&c Yellow #5 (tartrazine) Rash   Latex Rash   Sensitive not allergic        Medication List     STOP taking these medications    acetaminophen  650 MG CR tablet Commonly known as: TYLENOL  Replaced by: acetaminophen  500 MG tablet   glipiZIDE  2.5 MG 24 hr tablet Commonly known as: GLUCOTROL  XL   traMADol  50 MG tablet Commonly known as: ULTRAM        TAKE these medications    acetaminophen  500 MG tablet Commonly known as: TYLENOL  Take 2 tablets (1,000 mg total) by mouth every 6 (six) hours. Replaces: acetaminophen  650 MG CR tablet   amLODipine  5 MG tablet Commonly known as: NORVASC  Take 1 tablet (5 mg total) by mouth daily. Start taking on: Sep 17, 2023   apixaban  5 MG Tabs tablet Commonly known as: Eliquis  Take 1 tablet (5 mg total) by mouth 2 (two) times daily.   citalopram  10 MG tablet Commonly known as: CELEXA  Take 10 mg by mouth every Monday, Wednesday, and Friday.   docusate sodium  100 MG capsule Commonly known as:  COLACE Take 1 capsule (100 mg total) by mouth 2 (two) times daily.   HumuLIN  70/30 (70-30) 100 UNIT/ML injection Generic drug: insulin  NPH-regular Human Inject 20 Units into the skin 2 (two) times daily with a meal. What changed: how much to take   hydrALAZINE  25 MG tablet Commonly known as: APRESOLINE  Take 3 tablets (75 mg total) by mouth every 8 (eight) hours.   hydrochlorothiazide  25 MG tablet Commonly known as: HYDRODIURIL  Take 25 mg by mouth in the morning.   IMMUNE FORMULA PO Take 1 capsule by mouth in the morning and at bedtime. ESSIAC Herbal Extract Capsules Powerful Antioxidant Blend   INSULIN  SYRINGE 1CC/29G 29G X 1/2" 1 ML Misc   Global Inject Ease Insulin  Syr 31G X 5/16" 1 ML Misc Generic drug: Insulin  Syringe-Needle U-100 Inject into the skin.   levothyroxine  112 MCG tablet Commonly known as: SYNTHROID  TAKE 1 TABLET BY MOUTH ONCE DAILY BEFORE  BREAKFAST   loratadine  10 MG tablet Commonly known as: CLARITIN  Take 10 mg by mouth in the morning.   LUTEIN-ZEAXANTHIN PO Take 1 tablet by mouth at bedtime.   methocarbamol  500 MG tablet Commonly known as: ROBAXIN  Take 1 tablet (500 mg total) by mouth every 8 (eight) hours as needed for muscle spasms.   olmesartan 20 MG tablet Commonly known as: BENICAR Take 20 mg by mouth daily.   ondansetron  4 MG tablet Commonly known as: ZOFRAN  Take 1 tablet (4 mg total) by mouth every 6 (six) hours as needed for nausea.   oxyCODONE  5 MG immediate release tablet Commonly known as: Oxy IR/ROXICODONE  Take 1-1.5 tablets (5-7.5 mg total) by mouth every 6 (six) hours as needed for up to 3 days for severe pain (pain score 7-10) or moderate pain (pain score 4-6). What changed:  how much to take reasons to take this   polyethylene glycol 17 g packet Commonly known as: MIRALAX  / GLYCOLAX  Take 17 g by mouth daily. Start taking on: Sep 17, 2023   PROBIOTIC DAILY PO Take 1 capsule by mouth in the morning.   Systane 0.4-0.3 %  Soln Generic drug: Polyethyl Glycol-Propyl Glycol Place 1-2 drops into both eyes 3 (three) times daily as needed (dry/irritated eyes.).        Follow-up Information     Leesa Pulling, MD. Schedule an appointment as soon as possible for a visit in 2 week(s).   Specialty: Family Medicine Why: Hospital Follow Up Contact information: [address] [place] [address] [phone]         Liliane Rei, MD. Go on 09/18/2023.   Specialty: Orthopedic Surgery Why: as scheduled appointment Contact information: [address] [place] [address] [phone]                Allergies  Allergen Reactions   Ace Inhibitors Cough   Cardizem  [Diltiazem  Hcl] Other (See Comments)    headache   Misc. Sulfonamide Containing Compounds Other (See Comments)   Oxycodone  Nausea Only   Statins Other (See Comments)    myalgias   Sulfa Antibiotics Hives and Swelling   Tape     Paper tape is okay   Aspirin  Rash   Cortisone Rash    flushing   Fd&C Yellow #5 (Tartrazine) Rash   Latex Rash    Sensitive not allergic   Allergies as of 09/16/2023       Reactions   Ace Inhibitors Cough   Cardizem  [diltiazem  Hcl] Other (See Comments)   headache   Misc. Sulfonamide Containing Compounds Other (See Comments)   Oxycodone  Nausea Only   Statins Other (See Comments)   myalgias   Sulfa Antibiotics Hives, Swelling   Tape    Paper tape is okay   Aspirin  Rash   Cortisone Rash   flushing   Fd&c Yellow #5 (tartrazine) Rash   Latex Rash   Sensitive not allergic        Medication List     STOP taking these medications    acetaminophen  650 MG CR tablet Commonly known as: TYLENOL  Replaced by: acetaminophen  500 MG tablet   glipiZIDE  2.5 MG 24 hr tablet Commonly known as: GLUCOTROL  XL   traMADol  50 MG tablet Commonly known as: ULTRAM        TAKE these medications    acetaminophen  500 MG tablet Commonly known as: TYLENOL  Take 2 tablets (1,000 mg total) by  mouth every 6 (six) hours. Replaces: acetaminophen  650 MG CR tablet  amLODipine  5 MG tablet Commonly known as: NORVASC  Take 1 tablet (5 mg total) by mouth daily. Start taking on: Sep 17, 2023   apixaban  5 MG Tabs tablet Commonly known as: Eliquis  Take 1 tablet (5 mg total) by mouth 2 (two) times daily.   citalopram  10 MG tablet Commonly known as: CELEXA  Take 10 mg by mouth every Monday, Wednesday, and Friday.   docusate sodium  100 MG capsule Commonly known as: COLACE Take 1 capsule (100 mg total) by mouth 2 (two) times daily.   HumuLIN  70/30 (70-30) 100 UNIT/ML injection Generic drug: insulin  NPH-regular Human Inject 20 Units into the skin 2 (two) times daily with a meal. What changed: how much to take   hydrALAZINE  25 MG tablet Commonly known as: APRESOLINE  Take 3 tablets (75 mg total) by mouth every 8 (eight) hours.   hydrochlorothiazide  25 MG tablet Commonly known as: HYDRODIURIL  Take 25 mg by mouth in the morning.   IMMUNE FORMULA PO Take 1 capsule by mouth in the morning and at bedtime. ESSIAC Herbal Extract Capsules Powerful Antioxidant Blend   INSULIN  SYRINGE 1CC/29G 29G X 1/2" 1 ML Misc   Global Inject Ease Insulin  Syr 31G X 5/16" 1 ML Misc Generic drug: Insulin  Syringe-Needle U-100 Inject into the skin.   levothyroxine  112 MCG tablet Commonly known as: SYNTHROID  TAKE 1 TABLET BY MOUTH ONCE DAILY BEFORE BREAKFAST   loratadine  10 MG tablet Commonly known as: CLARITIN  Take 10 mg by mouth in the morning.   LUTEIN-ZEAXANTHIN PO Take 1 tablet by mouth at bedtime.   methocarbamol  500 MG tablet Commonly known as: ROBAXIN  Take 1 tablet (500 mg total) by mouth every 8 (eight) hours as needed for muscle spasms.   olmesartan 20 MG tablet Commonly known as: BENICAR Take 20 mg by mouth daily.   ondansetron  4 MG tablet Commonly known as: ZOFRAN  Take 1 tablet (4 mg total) by mouth every 6 (six) hours as needed for nausea.   oxyCODONE  5 MG immediate release  tablet Commonly known as: Oxy IR/ROXICODONE  Take 1-1.5 tablets (5-7.5 mg total) by mouth every 6 (six) hours as needed for up to 3 days for severe pain (pain score 7-10) or moderate pain (pain score 4-6). What changed:  how much to take reasons to take this   polyethylene glycol 17 g packet Commonly known as: MIRALAX  / GLYCOLAX  Take 17 g by mouth daily. Start taking on: Sep 17, 2023   PROBIOTIC DAILY PO Take 1 capsule by mouth in the morning.   Systane 0.4-0.3 % Soln Generic drug: Polyethyl Glycol-Propyl Glycol Place 1-2 drops into both eyes 3 (three) times daily as needed (dry/irritated eyes.).        Procedures/Studies: CT Knee Right Wo Contrast Result Date: 09/12/2023 CLINICAL DATA:  Knee trauma, distal femur fracture. 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 EXAM: CT OF THE RIGHT KNEE WITHOUT CONTRAST TECHNIQUE: Multidetector CT imaging of the right knee was performed according to the standard protocol. Multiplanar CT image reconstructions were also generated. RADIATION DOSE REDUCTION: This exam was performed according to the departmental dose-optimization program which includes automated exposure control, adjustment of the mA and/or kV according to patient size and/or use of iterative reconstruction technique. COMPARISON:  Radiographs 09/12/2023 FINDINGS: Bones/Joint/Cartilage Sagittally oriented acute fracture observed extending from the medial distal metaphysis into the medial femoral epicondyle and medial femoral condyle along the medial margin of the femoral component of the knee prosthesis. No patellar or proximal tibial or fibular fracture. Trace amount of gas  in the joint and in the marrow of the distal femur, considered postoperative. Large knee hemarthrosis. Ligaments Suboptimally assessed by CT. Muscles and Tendons Low-level edema tracks between the medial head gastrocnemius and the soleus muscle, probably incidental although a plantaris tear can cause a similar  appearance. Soft tissues Atherosclerosis.  Subcutaneous edema along the distal thigh. IMPRESSION: 1. Sagittally oriented acute fracture extending from the medial distal metaphysis into the medial femoral epicondyle and medial femoral condyle along the medial margin of the femoral component of the knee prosthesis. 2. Large knee hemarthrosis. 3. Low-level edema tracks between the medial head gastrocnemius and the soleus muscle, probably incidental although a plantaris tear can cause a similar appearance. 4. Atherosclerosis. 5. Subcutaneous edema along the distal thigh. Electronically Signed   By: Freida Jes M.D.   On: 09/12/2023 13:32   DG Knee Complete 4 Views Right Result Date: 09/12/2023 CLINICAL DATA:  Right knee pain. EXAM: RIGHT KNEE - COMPLETE 4+ VIEW COMPARISON:  None Available. FINDINGS: There is a nondisplaced fracture of the medial distal femur just above the medial epicondyle. Evaluation of the fracture is limited due to osteopenia. No other acute fracture. There is no dislocation. Total right knee arthroplasty appears intact and in anatomic alignment. There is a moderate joint effusion. Small radiolucent pockets inferior to the patella may represent air or fat lobules. There is diffuse subcutaneous edema. IMPRESSION: 1. Nondisplaced fracture of the medial distal femur. 2. Moderate joint effusion. Electronically Signed   By: Angus Bark M.D.   On: 09/12/2023 11:22   US  Venous Img Lower Unilateral Right Result Date: 09/12/2023 CLINICAL DATA:  Right leg swelling EXAM: Right LOWER EXTREMITY VENOUS DOPPLER ULTRASOUND TECHNIQUE: Gray-scale sonography with compression, as well as color and duplex ultrasound, were performed to evaluate the deep venous system(s) from the level of the common femoral vein through the popliteal and proximal calf veins. COMPARISON:  None Available. FINDINGS: VENOUS Normal compressibility of the common femoral, superficial femoral, and popliteal veins, as well as the  visualized calf veins. Visualized portions of profunda femoral vein and great saphenous vein unremarkable. No filling defects to suggest DVT on grayscale or color Doppler imaging. Doppler waveforms show normal direction of venous flow, normal respiratory plasticity and response to augmentation. Limited views of the contralateral common femoral vein are unremarkable. OTHER None. Limitations: none IMPRESSION: 1. No evidence of right lower extremity DVT. Electronically Signed   By: Reagan Camera M.D.   On: 09/12/2023 09:47   DG Abdomen 1 View Result Date: 09/07/2023 CLINICAL DATA:  Knee replacement surgery Monday and been on pain medication since. Has not had a bowel movement since 09/01/2023. Constipation. EXAM: ABDOMEN - 1 VIEW COMPARISON:  None Available. FINDINGS: Moderate stool in the colon and rectum. No evidence of obstruction. Cholecystectomy. IMPRESSION: Moderate stool in the colon and rectum. Electronically Signed   By: Rozell Cornet M.D.   On: 09/07/2023 00:36   CUP PACEART REMOTE DEVICE CHECK Result Date: 08/19/2023 Scheduled remote reviewed. Normal device function.  Presenting rhythm: SR, frequent PACs. 3 AHR detections, longest 1.5 hours Next remote 91 days. - CS, CVRS Scheduled remote reviewed. Normal device function.  Presenting rhythm: SR, frequent PACs. 3 AHR detections, longest 1.5 hours, EGMs consistent with AF, known history on Eliquis  per Epic. Next remote 91 days. - CS, CVRS    Subjective: Pt is eager to start her rehabilitation at [facility].  She says pain is controlled and she is willing to participate in therapy.   Discharge Exam:  Vitals:   09/16/23 0603 09/16/23 0932  BP: (!) 169/39 (!) 156/40  Pulse:  77  Resp:  18  Temp:  98 F (36.7 C)  SpO2:  96%   Vitals:   09/15/23 2254 09/16/23 0350 09/16/23 0603 09/16/23 0932  BP: (!) 159/39 (!) 169/39 (!) 169/39 (!) 156/40  Pulse: 70 68  77  Resp:  18  18  Temp: 98.4 F (36.9 C) 98.1 F (36.7 C)  98 F (36.7 C)   TempSrc: Oral Oral  Oral  SpO2: 96% 99%  96%  Weight:      Height:       General: Pt is alert, awake, not in acute distress Cardiovascular: normal S1/S2 +, no rubs, no gallops Respiratory: CTA bilaterally, no wheezing, no rhonchi Abdominal: Soft, NT, ND, bowel sounds + Extremities: right lower extremity in a knee immobilizer   The results of significant diagnostics from this hospitalization (including imaging, microbiology, ancillary and laboratory) are listed below for reference.     Microbiology: No results found for this or any previous visit (from the past 240 hours).   Labs: BNP (last 3 results) No results for input(s): "BNP" in the last 8760 hours. Basic Metabolic Panel: Recent Labs  Lab 09/12/23 0855 09/13/23 0400  NA 135 134*  K 3.6 3.8  CL 97* 99  CO2 29 30  GLUCOSE 95 115*  BUN 20 21  CREATININE 1.21* 1.28*  CALCIUM 8.9 8.0*  MG  --  1.9   Liver Function Tests: Recent Labs  Lab 09/12/23 0855  AST 19  ALT 13  ALKPHOS 71  BILITOT 1.8*  PROT 6.3*  ALBUMIN 3.3*   No results for input(s): "LIPASE", "AMYLASE" in the last 168 hours. No results for input(s): "AMMONIA" in the last 168 hours. CBC: Recent Labs  Lab 09/12/23 0855 09/13/23 0400 09/13/23 1352 09/14/23 0436  WBC 10.7* 7.7  --  8.4  NEUTROABS 7.8*  --   --   --   HGB 9.0* 7.5* 8.2* 8.1*  HCT 27.1* 23.8* 25.3* 25.9*  MCV 106.7* 107.2*  --  108.8*  PLT 338 276  --  305   Cardiac Enzymes: No results for input(s): "CKTOTAL", "CKMB", "CKMBINDEX", "TROPONINI" in the last 168 hours. BNP: Invalid input(s): "POCBNP" CBG: Recent Labs  Lab 09/15/23 0736 09/15/23 1108 09/15/23 1605 09/16/23 0322 09/16/23 0716  GLUCAP 107* 127* 158* 88 121*   D-Dimer No results for input(s): "DDIMER" in the last 72 hours. Hgb A1c No results for input(s): "HGBA1C" in the last 72 hours. Lipid Profile No results for input(s): "CHOL", "HDL", "LDLCALC", "TRIG", "CHOLHDL", "LDLDIRECT" in the last 72  hours. Thyroid  function studies No results for input(s): "TSH", "T4TOTAL", "T3FREE", "THYROIDAB" in the last 72 hours.  Invalid input(s): "FREET3" Anemia work up Recent Labs    09/14/23 0815 09/14/23 0817  VITAMINB12  --  432  FOLATE 8.3  --   FERRITIN  --  107  TIBC  --  286  IRON  --  37  RETICCTPCT 6.1*  --    Urinalysis    Component Value Date/Time   COLORURINE YELLOW 01/29/2017 1020   APPEARANCEUR CLEAR 01/29/2017 1020   APPEARANCEUR Clear 04/01/2014 1719   LABSPEC 1.015 03/18/2017 1619   PHURINE 6.0 03/18/2017 1619   PHURINE 5.0 01/29/2017 1020   GLUCOSEU Negative 03/18/2017 1619   HGBUR Moderate 03/18/2017 1619   HGBUR SMALL (A) 01/29/2017 1020   BILIRUBINUR Negative 03/18/2017 1619   KETONESUR Negative 03/18/2017 1619   KETONESUR  NEGATIVE 01/29/2017 1020   PROTEINUR 30 03/18/2017 1619   PROTEINUR NEGATIVE 01/29/2017 1020   UROBILINOGEN 0.2 03/18/2017 1619   NITRITE Negative 03/18/2017 1619   NITRITE NEGATIVE 01/29/2017 1020   LEUKOCYTESUR Large 03/18/2017 1619   Sepsis Labs Recent Labs  Lab 09/12/23 0855 09/13/23 0400 09/14/23 0436  WBC 10.7* 7.7 8.4   Microbiology No results found for this or any previous visit (from the past 240 hours).  Time coordinating discharge:  41 mins   SIGNED:  Faustino Hook, MD  [facility] 09/16/2023, 9:55 AM How to contact the [facility] Attending or Consulting provider 7A - 7P or covering provider during after hours 7P -7A, for this patient?  Check the care team in [facility] and look for a) attending/consulting [facility] provider listed and b) the [facility] team listed Log into [email or web address] and use [facility] universal password to access. If you do not have the password, please contact the hospital operator. Locate the [facility] provider you are looking for under [facility] and page to a number that you can be directly reached. If you still have difficulty reaching the provider, please page the [facility] (Director on Call) for the  Hospitalists listed on amion for assistance.

## 2023-09-16 NOTE — Progress Notes (Signed)
 Nurse at bedside,blood pressure 145/55,heart rate 66,Dr Lincoln Renshaw notified,holding the Hydralazine . Plan of care on going.

## 2023-09-16 NOTE — Discharge Instructions (Addendum)
 Orthopedic instructions  Touch down weight bearing with walker and 1-2 person assist follow up with Dr. France Ina on Wed 5/14 as scheduled   IMPORTANT INFORMATION: PAY CLOSE ATTENTION   PHYSICIAN DISCHARGE INSTRUCTIONS  Follow with Primary care provider  Leesa Pulling, MD  and other consultants as instructed by your Hospitalist Physician  SEEK MEDICAL CARE OR RETURN TO EMERGENCY ROOM IF SYMPTOMS COME BACK, WORSEN OR NEW PROBLEM DEVELOPS   Please note: You were cared for by a hospitalist during your hospital stay. Every effort will be made to forward records to your primary care provider.  You can request that your primary care provider send for your hospital records if they have not received them.  Once you are discharged, your primary care physician will handle any further medical issues. Please note that NO REFILLS for any discharge medications will be authorized once you are discharged, as it is imperative that you return to your primary care physician (or establish a relationship with a primary care physician if you do not have one) for your post hospital discharge needs so that they can reassess your need for medications and monitor your lab values.  Please get a complete blood count and chemistry panel checked by your Primary MD at your next visit, and again as instructed by your Primary MD.  Get Medicines reviewed and adjusted: Please take all your medications with you for your next visit with your Primary MD  Laboratory/radiological data: Please request your Primary MD to go over all hospital tests and procedure/radiological results at the follow up, please ask your primary care provider to get all Hospital records sent to his/her office.  In some cases, they will be blood work, cultures and biopsy results pending at the time of your discharge. Please request that your primary care provider follow up on these results.  If you are diabetic, please bring your blood sugar readings with  you to your follow up appointment with primary care.    Please call and make your follow up appointments as soon as possible.    Also Note the following: If you experience worsening of your admission symptoms, develop shortness of breath, life threatening emergency, suicidal or homicidal thoughts you must seek medical attention immediately by calling 911 or calling your MD immediately  if symptoms less severe.  You must read complete instructions/literature along with all the possible adverse reactions/side effects for all the Medicines you take and that have been prescribed to you. Take any new Medicines after you have completely understood and accpet all the possible adverse reactions/side effects.   Do not drive when taking Pain medications or sleeping medications (Benzodiazepines)  Do not take more than prescribed Pain, Sleep and Anxiety Medications. It is not advisable to combine anxiety,sleep and pain medications without talking with your primary care practitioner  Special Instructions: If you have smoked or chewed Tobacco  in the last 2 yrs please stop smoking, stop any regular Alcohol   and or any Recreational drug use.  Wear Seat belts while driving.  Do not drive if taking any narcotic, mind altering or controlled substances or recreational drugs or alcohol .

## 2023-09-16 NOTE — Progress Notes (Signed)
 Patient being discharged to [facility],report called and given report to Oleh Berliner, LPN. IV discontinued,catheter.Staff to accompany patient to awaiting vehicle.

## 2023-09-16 NOTE — Plan of Care (Signed)
  Problem: Health Behavior/Discharge Planning: Goal: Ability to manage health-related needs will improve Outcome: Progressing   Problem: Clinical Measurements: Goal: Ability to maintain clinical measurements within normal limits will improve Outcome: Progressing Goal: Will remain free from infection Outcome: Progressing Goal: Diagnostic test results will improve Outcome: Progressing   Problem: Activity: Goal: Risk for activity intolerance will decrease Outcome: Progressing   Problem: Nutrition: Goal: Adequate nutrition will be maintained Outcome: Progressing   Problem: Elimination: Goal: Will not experience complications related to urinary retention Outcome: Progressing   Problem: Pain Managment: Goal: General experience of comfort will improve and/or be controlled Outcome: Progressing   Problem: Safety: Goal: Ability to remain free from injury will improve Outcome: Progressing   Problem: Skin Integrity: Goal: Risk for impaired skin integrity will decrease Outcome: Progressing   Problem: Coping: Goal: Ability to adjust to condition or change in health will improve Outcome: Progressing   Problem: Fluid Volume: Goal: Ability to maintain a balanced intake and output will improve Outcome: Progressing   Problem: Health Behavior/Discharge Planning: Goal: Ability to manage health-related needs will improve Outcome: Progressing   Problem: Metabolic: Goal: Ability to maintain appropriate glucose levels will improve Outcome: Progressing   Problem: Tissue Perfusion: Goal: Adequacy of tissue perfusion will improve Outcome: Progressing

## 2023-09-16 NOTE — Progress Notes (Signed)
 Nurse at bedside,patient alert and oriented times four.Blood pressure was 156/40,heart rate 77,Dr Johnson notified,holding the Irbesartan  this am. Plan of care on going.

## 2023-09-17 ENCOUNTER — Encounter: Payer: Self-pay | Admitting: Adult Health

## 2023-09-17 ENCOUNTER — Non-Acute Institutional Stay (SKILLED_NURSING_FACILITY): Admitting: Adult Health

## 2023-09-17 DIAGNOSIS — D62 Acute posthemorrhagic anemia: Secondary | ICD-10-CM | POA: Insufficient documentation

## 2023-09-17 DIAGNOSIS — I441 Atrioventricular block, second degree: Secondary | ICD-10-CM | POA: Diagnosis not present

## 2023-09-17 DIAGNOSIS — I700 Atherosclerosis of aorta: Secondary | ICD-10-CM | POA: Insufficient documentation

## 2023-09-17 DIAGNOSIS — E039 Hypothyroidism, unspecified: Secondary | ICD-10-CM | POA: Diagnosis not present

## 2023-09-17 DIAGNOSIS — I480 Paroxysmal atrial fibrillation: Secondary | ICD-10-CM | POA: Diagnosis not present

## 2023-09-17 DIAGNOSIS — K5909 Other constipation: Secondary | ICD-10-CM | POA: Insufficient documentation

## 2023-09-17 DIAGNOSIS — I10 Essential (primary) hypertension: Secondary | ICD-10-CM

## 2023-09-17 DIAGNOSIS — J3089 Other allergic rhinitis: Secondary | ICD-10-CM | POA: Diagnosis not present

## 2023-09-17 DIAGNOSIS — E1169 Type 2 diabetes mellitus with other specified complication: Secondary | ICD-10-CM

## 2023-09-17 DIAGNOSIS — E669 Obesity, unspecified: Secondary | ICD-10-CM

## 2023-09-17 DIAGNOSIS — S72434D Nondisplaced fracture of medial condyle of right femur, subsequent encounter for closed fracture with routine healing: Secondary | ICD-10-CM | POA: Diagnosis not present

## 2023-09-17 DIAGNOSIS — M1711 Unilateral primary osteoarthritis, right knee: Secondary | ICD-10-CM | POA: Diagnosis not present

## 2023-09-17 NOTE — Progress Notes (Signed)
 Location:  [facility] Room Number: 136 P Place of Service:  SNF (31)   CODE STATUS: DNR  Allergies  Allergen Reactions   Ace Inhibitors Cough   Cardizem  [Diltiazem  Hcl] Other (See Comments)    headache   Misc. Sulfonamide Containing Compounds Other (See Comments)   Oxycodone  Nausea Only   Statins Other (See Comments)    myalgias   Sulfa Antibiotics Hives and Swelling   Tape     Paper tape is okay   Aspirin  Rash   Cortisone Rash    flushing   Fd&C Yellow #5 (Tartrazine) Rash   Latex Rash    Sensitive not allergic    Chief Complaint  Patient presents with   Hospitalization Follow-up    HPI:  She is a 78 year old woman who has been hospitalized from 09-12-23 through 09-16-23. 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 Is status post right knee replacement on 09-02-23 by Dr. France Ina. She had been working outpatient in therapy. She had had some elevated blood pressure readings. Her blood pressure medications had been adjusted. She had had increased swelling in the right knee and was concerned about a possible blood clot.  The pain became so severe that she was unable to ambulate. Orthopedics recommended that she go the ED for further workup. She did not have a blood clot but did have a nondisplaced distal medial condyle femoral fracture on the right. This was discussed with [facility]. It is non operative and will need supportive care. She has had problems with pain management. She is here for short term rehab with her goal to return back home. She will continue to be followed for her chronic illnesses including: Morbid obesity:Chronic constipation: Chronic non seasonal allergic rhinitis: Acute blood loss anemia as cause of postoperative anemia   Past Medical History:  Diagnosis Date   Anemia    hx of   Anxiety    Cancer  (HCC)    endometrial cancer/ skin cancers removed nose non melanoma   Diabetes (HCC)    Type 2   DJD (degenerative joint disease)    Dysrhythmia    SYMPTOMATIC BRADYCARDIA  / A. FIB   Family history of breast cancer    Family history of cancer    Gallstones    History of radiation therapy 04/04/17-04/25/17   vaginal cuff 30 Gy in 5 fractions   Hypertension    HCTZ   Hypothyroidism    Obesity    Pacemaker 12/15/2012   DUAL CHAMBER    DR Carolynne Citron   Sleep apnea    No machine Cant tolerate   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   Wears glasses     Past Surgical History:  Procedure Laterality Date   ANKLE SURGERY Right    multiple   BIOPSY  03/11/2023   Procedure: BIOPSY;  Surgeon: Vinetta Greening, DO;  [facility];  Service: Endoscopy;;   BREAST SURGERY     biopsy L breast benign   CHOLECYSTECTOMY     Laparoscopic but converted to open  d/t large stones   COLONOSCOPY     COLONOSCOPY WITH PROPOFOL  N/A 03/11/2023   Procedure: COLONOSCOPY WITH PROPOFOL ;  Surgeon: Vinetta Greening, DO;  [facility];  Service: Endoscopy;  Laterality: N/A;  915am, asa 3   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  EYE SURGERY     both cataracts   FOOT ARTHROTOMY     left   FOOT SURGERY     Bil  bone spurs /Left  foot neuroma removed Right foot sinus tarside surgery Right bone in toe removed   KNEE SURGERY     right   PACEMAKER INSERTION  12/15/2012   SJM 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 by Dr Carolynne Citron for 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   PERMANENT PACEMAKER INSERTION N/A 12/15/2012   SJM 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 by Dr Carolynne Citron for transient AV block   POLYPECTOMY  03/11/2023   Procedure: POLYPECTOMY;  Surgeon: Vinetta Greening, DO;  [facility];  Service: Endoscopy;;   ROBOTIC ASSISTED TOTAL HYSTERECTOMY WITH BILATERAL SALPINGO OOPHERECTOMY N/A 02/19/2017   Procedure: XI ROBOTIC ASSISTED TOTAL HYSTERECTOMY WITH BILATERAL SALPINGO OOPHORECTOMY;  Surgeon:  Alphonso Aschoff, MD;  Location: [facility];  Service: Gynecology;  Laterality: N/A;   SENTINEL NODE BIOPSY N/A 02/19/2017   Procedure: SENTINEL NODE BIOPSY;  Surgeon: Alphonso Aschoff, MD;  Location: [facility];  Service: Gynecology;  Laterality: N/A;   SHOULDER ARTHROSCOPY WITH SUBACROMIAL DECOMPRESSION AND OPEN ROTATOR C Right 07/07/2013   Procedure: RIGHT SHOULDER ARTHROSCOPY WITH SUBACROMIAL DECOMPRESSION AND ROTATOR CUFF DEBRIDEMENT/ DISTAL CLAVICLE RESECTION;  Surgeon: Amelie Baize., MD;  Location: [facility];  Service: Orthopedics;  Laterality: Right;   SHOULDER SURGERY Right 2012   done in [place]   TOTAL KNEE ARTHROPLASTY Right 09/02/2023   Procedure: ARTHROPLASTY, KNEE, TOTAL;  Surgeon: Liliane Rei, MD;  Location: [facility];  Service: Orthopedics;  Laterality: Right;    Social History   Socioeconomic History   Marital status: Widowed    Spouse name: Not on file   Number of children: 2   Years of education: Not on file   Highest education level: Not on file  Occupational History   Occupation: retired  Tobacco Use   Smoking status: Never   Smokeless tobacco: Never  Vaping Use   Vaping status: Never Used  Substance and Sexual Activity   Alcohol  use: No    Alcohol /week: 0.0 standard drinks of alcohol    Drug use: No   Sexual activity: Not Currently    Birth control/protection: Post-menopausal  Other Topics Concern   Not on file  Social History Narrative   Retired and lives alone in [place], Widowed now      Right handed    Wear glasses    Drinks ginger ale ( sugar free)   Social Drivers of Corporate investment banker Strain: Not on file  Food Insecurity: No Food Insecurity (09/12/2023)   Hunger Vital Sign    Worried About Running Out of Food in the Last Year: Never true    Ran Out of Food in the Last Year: Never true  Transportation Needs: No Transportation Needs (09/12/2023)   PRAPARE - Administrator, Civil Service (Medical): No    Lack of Transportation  (Non-Medical): No  Physical Activity: Not on file  Stress: Not on file  Social Connections: Moderately Isolated (09/12/2023)   Social Connection and Isolation Panel [NHANES]    Frequency of Communication with Friends and Family: More than three times a week    Frequency of Social Gatherings with Friends and Family: More than three times a week    Attends Religious Services: More than 4 times per year    Active Member of [employer] or Organizations: No    Attends Banker Meetings: Never  Marital Status: Widowed  Intimate Partner Violence: Not At Risk (09/12/2023)   Humiliation, Afraid, Rape, and Kick questionnaire    Fear of Current or Ex-Partner: No    Emotionally Abused: No    Physically Abused: No    Sexually Abused: No   Family History  Problem Relation Age of Onset   Sudden death Brother        age 49s, died while playing raquetball   Stroke Mother    Cancer Father        throat   Tongue cancer Father    Heart disease Maternal Grandmother    Cancer Maternal Aunt        type unk,hx smoking   Cancer Maternal Uncle        type unk, hx smok   Cancer Paternal Aunt 9       thinks it was GI/colon- had hole in side and colostomy bag   Cancer Maternal Aunt        type unk, had a colostomy, hx smoking   Cancer Maternal Uncle        cancer maybe-?type unk   Throat cancer Maternal Uncle        hx smoking   Heart attack Maternal Uncle 50   Cancer Cousin        type unk- died in 47's/50's   Cancer Cousin        type unk, died in 60's/50's   Breast cancer Cousin        x2   Other Other 25       brain tumor   Allergic rhinitis Neg Hx    Angioedema Neg Hx    Asthma Neg Hx    Atopy Neg Hx    Eczema Neg Hx    Immunodeficiency Neg Hx    Urticaria Neg Hx       VITAL SIGNS BP (!) 148/62   Pulse 64   Temp 98 F (36.7 C)   Resp 18   Ht 5' 5.5" (1.664 m)   Wt 237 lb (107.5 kg)   SpO2 96%   BMI 38.84 kg/m   Outpatient Encounter Medications as of 09/17/2023   Medication Sig   acetaminophen  (TYLENOL ) 325 MG tablet Take 650 mg by mouth every 6 (six) hours.   amLODipine  (NORVASC ) 5 MG tablet Take 1 tablet (5 mg total) by mouth daily.   apixaban  (ELIQUIS ) 5 MG TABS tablet Take 1 tablet (5 mg total) by mouth 2 (two) times daily.   citalopram  (CELEXA ) 10 MG tablet Take 10 mg by mouth every Monday, Wednesday, and Friday.   docusate sodium  (COLACE) 100 MG capsule Take 1 capsule (100 mg total) by mouth 2 (two) times daily.   HUMULIN  70/30 (70-30) 100 UNIT/ML injection Inject 20 Units into the skin 2 (two) times daily with a meal.   hydrALAZINE  (APRESOLINE ) 25 MG tablet Take 3 tablets (75 mg total) by mouth every 8 (eight) hours.   hydrochlorothiazide  (HYDRODIURIL ) 25 MG tablet Take 25 mg by mouth in the morning.   levothyroxine  (SYNTHROID ) 112 MCG tablet TAKE 1 TABLET BY MOUTH ONCE DAILY BEFORE BREAKFAST   loratadine  (CLARITIN ) 10 MG tablet Take 10 mg by mouth in the morning.   methocarbamol  (ROBAXIN ) 500 MG tablet Take 1 tablet (500 mg total) by mouth every 8 (eight) hours as needed for muscle spasms.   Multiple Vitamins-Minerals (LUTEIN-ZEAXANTHIN PO) Take 1 tablet by mouth at bedtime.   olmesartan (BENICAR) 20 MG tablet Take 20 mg by mouth daily.  ondansetron  (ZOFRAN ) 4 MG tablet Take 1 tablet (4 mg total) by mouth every 6 (six) hours as needed for nausea.   oxyCODONE  (OXY IR/ROXICODONE ) 5 MG immediate release tablet Take 5 mg by mouth as directed. oral, Every 6 Hours - PRN, One every 6 hours as needed for moderate to severe pain - level 4 or higher AND oral, Every 6 Hours - PRN, One every 6 hours as needed for pain 8-10/10 thru 09/23/23 & then provider to reassess need.   Polyethyl Glycol-Propyl Glycol (SYSTANE) 0.4-0.3 % SOLN Place 1-2 drops into both eyes 3 (three) times daily as needed (dry/irritated eyes.).   polyethylene glycol (MIRALAX  / GLYCOLAX ) 17 g packet Take 17 g by mouth daily.   Probiotic Product (PROBIOTIC DAILY PO) Take 1 capsule by mouth  in the morning.   oxyCODONE  (OXY IR/ROXICODONE ) 5 MG immediate release tablet Take 1-1.5 tablets (5-7.5 mg total) by mouth every 6 (six) hours as needed for up to 3 days for severe pain (pain score 7-10) or moderate pain (pain score 4-6). (Patient not taking: Reported on 09/17/2023)   No facility-administered encounter medications on file as of 09/17/2023.     SIGNIFICANT DIAGNOSTIC EXAMS  TODAY  09-12-23: wbc 10.7; hgb 9.0; hct 27.1 mcv 106.7 plt 338; glucose 95; bun 20; creat 1.21; k+ 3.6; na++ 135; ca 8.9; gfr 46; protein 6.3; albumin 3.3 09-14-23: wbc 8.4; hgb 8.1; hct 25.9; mcv 108.8 plt 305; folic 8.3; vitamin B 12: 432; iron 37; tibc 286  Review of Systems  Constitutional:  Negative for malaise/fatigue.  Respiratory:  Negative for cough and shortness of breath.   Cardiovascular:  Negative for chest pain, palpitations and leg swelling.  Gastrointestinal:  Positive for constipation, nausea and vomiting. Negative for abdominal pain and heartburn.  Musculoskeletal:  Positive for joint pain. Negative for back pain and myalgias.       Right leg pain   Skin: Negative.   Neurological:  Negative for dizziness.  Psychiatric/Behavioral:  The patient is not nervous/anxious.    Physical Exam Constitutional:      General: She is not in acute distress.    Appearance: She is well-developed. She is obese. She is not diaphoretic.  Neck:     Thyroid : No thyromegaly.  Cardiovascular:     Rate and Rhythm: Normal rate and regular rhythm.     Pulses: Normal pulses.     Heart sounds: Normal heart sounds.  Pulmonary:     Effort: Pulmonary effort is normal. No respiratory distress.     Breath sounds: Normal breath sounds.  Abdominal:     General: Bowel sounds are normal. There is no distension.     Palpations: Abdomen is soft.     Tenderness: There is no abdominal tenderness.  Musculoskeletal:     Cervical back: Neck supple.     Right lower leg: Edema present.     Left lower leg: Edema present.      Comments: Right leg in immobilizer   Lymphadenopathy:     Cervical: No cervical adenopathy.  Skin:    General: Skin is warm and dry.  Neurological:     Mental Status: She is alert and oriented to person, place, and time.  Psychiatric:        Mood and Affect: Mood normal.     ASSESSMENT/ PLAN:  TODAY  Closed nondisplaced fracture of medial condyle of right femur with routine healing subsequent encounter: primary osteoarthritis right knee: is status post right knee replacement. Will continue tylenol   650 mg every 6 hours routinely; oxycodone  5 mg every 6 hours as needed will use with zofran  to prevent nausea; will change methocarbamol  250 mg every 6 hours routinely she has touch down weight bearing; has left immobilizer in place. Will follow up with orthopedics as directed and will continue  therapy.   2. Uncontrolled hypertension: b/p 148/62: will continue norvasc  5 mg daily; hydralazine  75 mg every 8 hours; hydrochlorothiazide  25 mg daily; olmesartan 20 mg daily. She tells me that her blood pressure tends to go up when she is in severe pain.   3. Paroxsymal atrial fibrillation/second degree mobitz type 2: s/p pacemaker: heart rate is stable will continue eliquis  5 mg twice daily   4. Aortic atherosclerosis (ct 09-05-20)  5. Type 2 diabetes mellitus with obesity: will continue humulin  70/30: 20 units twice daily; is on arb  6. Hypothyroidism unspecified type: will continue synthroid  112 mcg daily   7. Morbid obesity: BMI 38.84  8. Chronic constipation: will continue senna s twice daily; miralax  daily and will begin dulcolax supp as needed   9. Chronic non seasonal allergic rhinitis: will continue claritin  10 mg daily   10. Acute blood loss anemia as cause of postoperative anemia: hgb 8.1 will begin iron three days per week.   11. Chronic depression: will continue celexa  10 mg three days weekly.   6.    Britt Candle NP [facility]  call [phone]

## 2023-09-18 ENCOUNTER — Encounter: Payer: Self-pay | Admitting: Internal Medicine

## 2023-09-18 ENCOUNTER — Other Ambulatory Visit: Payer: Self-pay | Admitting: Adult Health

## 2023-09-18 ENCOUNTER — Non-Acute Institutional Stay (SKILLED_NURSING_FACILITY): Payer: Self-pay | Admitting: Internal Medicine

## 2023-09-18 DIAGNOSIS — N183 Chronic kidney disease, stage 3 unspecified: Secondary | ICD-10-CM | POA: Insufficient documentation

## 2023-09-18 DIAGNOSIS — I10 Essential (primary) hypertension: Secondary | ICD-10-CM | POA: Diagnosis not present

## 2023-09-18 DIAGNOSIS — S72434D Nondisplaced fracture of medial condyle of right femur, subsequent encounter for closed fracture with routine healing: Secondary | ICD-10-CM | POA: Diagnosis not present

## 2023-09-18 DIAGNOSIS — E1122 Type 2 diabetes mellitus with diabetic chronic kidney disease: Secondary | ICD-10-CM

## 2023-09-18 DIAGNOSIS — D62 Acute posthemorrhagic anemia: Secondary | ICD-10-CM | POA: Diagnosis not present

## 2023-09-18 MED ORDER — OXYCODONE HCL 5 MG PO TABS: 5.0000 mg | ORAL_TABLET | Freq: Four times a day (QID) | ORAL | 0 refills | Status: AC | PRN

## 2023-09-18 NOTE — Assessment & Plan Note (Signed)
PT/OT at SNF as tolerated. ?

## 2023-09-18 NOTE — Assessment & Plan Note (Signed)
 Ferrous sulfate will be initiated 3 times a week.  Monitor for any bleeding dyscrasias.

## 2023-09-18 NOTE — Progress Notes (Signed)
 NURSING HOME LOCATION:  [facility] ROOM NUMBER: [id number] P   CODE STATUS:  Full Code  PCP:  Leeanna Puff. Daniel MD, [place]  This is a comprehensive admission note to this SNFperformed on this date less than 30 days from date of admission. Included are preadmission medical/surgical history; reconciled medication list; family history; social history and comprehensive review of systems.  Corrections and additions to the records were documented. Comprehensive physical exam was also performed. Additionally a clinical summary was entered for each active diagnosis pertinent to this admission in the Problem List to enhance continuity of care.  HPI: She was hospitalized 5/8 - 09/16/2023 admitted with increasing swelling and pain in the right knee since 5/7.  This is in the context of having had an elective right knee arthroplasty 4/28 by Dr. Rossie Coon.  She been working with PT and ambulating by touchdown weightbearing for approximately 3 days prior to onset of pain & swelling. Symptoms progressed to the point that she was unable to ambulate..  Orthopedics referred her to the ED to rule out DVT.  Imaging and CT revealed a nondisplaced distal medial condyle femoral fracture of the right knee.  Orthopedics consulted; the fracture was felt to be nonoperative.  Right knee immobilization, with mobilization for touchdown weightbearing with a walker & 1-2 person assist were recommended.  She was admitted for pain control. In the context of increasing pain her blood pressure was poorly controlled.  Amlodipine  and hydralazine  75 mg 3 times daily were added with improvement in the blood pressure.  On 5/8 H/H was 9.0/27.1.  Nadir H/H was 7.5/23.8 & pre discharge values 8.1/25.9.Iron level was 37 (28-170). MCV was 108.8; both B12 & folic acid  levels were normal.  On 4/15 prior to the original orthopedic surgery H/H was 11.7/35.2.   Her diabetes was adequately controlled based on an A1c of 7.6%.  While  hospitalized glucoses ranged from a low of 52 up to high of 198; both were outliers.  Because of isolated hypoglycemia her 70/30 insulin  dose was decreased to 20 units twice daily with meals.  She has CKD.  Creatinine initially was 1.21 and rose to 1.28; the associated GFR's were 46 and 43 respectively.  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  Of note her creatinine had been 1.51 on 09/03/2023.  Albumin was 3.3 and total protein 6.3. PT/OT consulted and recommended SNF placement for rehab.  Past medical and surgical history: Includes history of anxiety/depression, diabetes with CKD, history of cholelithiasis, essential hypertension, hypothyroidism, history of endometrial cancer, and OSA. Surgeries and procedures including right ankle surgery, breast biopsy, cholecystectomy, colonoscopy, pacemaker placement, shoulder surgery, and hysterectomy.    Family history: reviewed, non contributory due to advanced age. There is a strong history of cancers.  Social history: non drinker , non smoker.   Review of systems: She fell on a slight incline outside her home on 04/09/2023 fracturing 2 vertebrae.  She denies any cardiac or neurologic prodrome prior to that fall.  She states she has had a bone mineral density study and no intervention was recommended for any osteoporosis.  She is unsure of her T-score.  As noted she does have a pacer.  She states her hands stay "cold" & questions whether this were related to her thyroid  issue.  By history she has Raynaud's phenomenon with exposure to cold.She has occasional pill dysphagia.  Despite the significant anemia, she denies any bleeding dyscrasias.  Constitutional: No fever, significant weight change, fatigue  Eyes: No redness, discharge, pain, vision change ENT/mouth: No nasal congestion, purulent discharge, earache, change in hearing, sore throat  Cardiovascular: No chest pain, palpitations, paroxysmal nocturnal dyspnea, claudication Respiratory: No  cough, sputum production, hemoptysis, DOE, significant snoring, apnea Gastrointestinal: No heartburn,  abdominal pain, nausea /vomiting, rectal bleeding, melena, change in bowels Genitourinary: No dysuria, hematuria, pyuria, incontinence, nocturia Dermatologic: No rash, pruritus, change in appearance of skin Neurologic: No dizziness, headache, syncope, seizures, numbness, tingling Psychiatric: No significant insomnia, anorexia Endocrine: No change in hair/skin/nails, excessive thirst, excessive hunger, excessive urination  Hematologic/lymphatic: No significant bruising, lymphadenopathy, abnormal bleeding Allergy/immunology: No itchy/watery eyes, significant sneezing, urticaria, angioedema  Physical exam:  Pertinent or positive findings: Eyebrows are decreased in density.  The right lacrimal gland is prominent.  Pacer is present.  Abdomen is protuberant.  She has 1/2+ edema at the left sock line.  Pedal pulses are not palpable.  Brace is present over the right lower extremity from the thigh to the upper ankle.  General appearance: Adequately nourished; no acute distress, increased work of breathing is present.   Lymphatic: No lymphadenopathy about the head, neck, axilla. Eyes: No conjunctival inflammation or lid edema is present. There is no scleral icterus. Ears:  External ear exam shows no significant lesions or deformities.   Nose:  External nasal examination shows no deformity or inflammation. Nasal mucosa are pink and moist without lesions, exudates Oral exam: Lips and gums are healthy appearing.There is no oropharyngeal erythema or exudate. Neck:  No thyromegaly, masses, tenderness noted.    Heart:  Normal rate and regular rhythm. S1 and S2 normal without gallop, murmur, click, rub.  Lungs: Chest clear to auscultation without wheezes, rhonchi, rales, rubs. Abdomen: Bowel sounds are normal.  Abdomen is soft and nontender with no organomegaly, hernias, masses. GU: Deferred  Extremities:  No  cyanosis, clubbing. Neurologic exam: Balance, Rhomberg, finger to nose testing could not be completed due to clinical state Deep tendon reflexes are equal Skin: Warm & dry w/o tenting. No significant lesions or rash.  See clinical summary under each active problem in the Problem List with associated updated therapeutic plan

## 2023-09-18 NOTE — Patient Instructions (Addendum)
 See assessment and plan under each diagnosis in the problem list and acutely for this visit :  Acute blood loss as cause of postoperative anemia Ferrous sulfate will be initiated 3 times a week.  Monitor for any bleeding dyscrasias.  Closed fracture of medial condyle of right femur (HCC) PT/OT at SNF as tolerated.  Uncontrolled hypertension The uncontrolled blood pressure was in the context of severe pain related to the medial condyle femoral fracture.  Improved control was achieved by the addition of amlodipine  and hydralazine  to her maintenance blood pressure medicines.  Here at the SNF the blood pressure does fluctuate related to her pain level.  Today's blood pressure exhibits good control.  Continue to monitor and establish blood pressure average.  CKD stage 3 due to type 2 diabetes mellitus (HCC) DM control is good as evidenced by an A1c of 7.6%.  Insulin  70/30 was reduced to 20 units twice a day while hospitalized because of isolated hypoglycemia.  While hospitalized initial GFR was 46; predischarge value was 43.    This would be compatible with CKD high stage IIIb.  Med list reviewed; no change in meds or dosages clinically indicated.  Continue to monitor renal function.

## 2023-09-18 NOTE — Assessment & Plan Note (Addendum)
 DM control is good as evidenced by an A1c of 7.6%.  Insulin  70/30 was reduced to 20 units twice a day while hospitalized because of isolated hypoglycemia.  While hospitalized initial GFR was 46; predischarge value was 43.    This would be compatible with CKD high stage IIIb.  Med list reviewed; no change in meds or dosages clinically indicated.  Continue to monitor renal function.

## 2023-09-18 NOTE — Assessment & Plan Note (Signed)
 The uncontrolled blood pressure was in the context of severe pain related to the medial condyle femoral fracture.  Improved control was achieved by the addition of amlodipine  and hydralazine  to her maintenance blood pressure medicines.  Here at the SNF the blood pressure does fluctuate related to her pain level.  Today's blood pressure exhibits good control.  Continue to monitor and establish blood pressure average.

## 2023-09-20 DIAGNOSIS — Z5189 Encounter for other specified aftercare: Secondary | ICD-10-CM | POA: Diagnosis not present

## 2023-09-23 ENCOUNTER — Other Ambulatory Visit (HOSPITAL_COMMUNITY)
Admission: RE | Admit: 2023-09-23 | Discharge: 2023-09-23 | Disposition: A | Discharge status: Home / Self Care | Source: Skilled Nursing Facility | Attending: Adult Health | Admitting: Adult Health

## 2023-09-23 DIAGNOSIS — E1159 Type 2 diabetes mellitus with other circulatory complications: Secondary | ICD-10-CM | POA: Insufficient documentation

## 2023-09-23 LAB — CBC
HCT: 26.7 % — ABNORMAL LOW (ref 36.0–46.0)
Hemoglobin: 8.7 g/dL — ABNORMAL LOW (ref 12.0–15.0)
MCH: 34.8 pg — ABNORMAL HIGH (ref 26.0–34.0)
MCHC: 32.6 g/dL (ref 30.0–36.0)
MCV: 106.8 fL — ABNORMAL HIGH (ref 80.0–100.0)
Platelets: 213 10*3/uL (ref 150–400)
RBC: 2.5 MIL/uL — ABNORMAL LOW (ref 3.87–5.11)
RDW: 16.3 % — ABNORMAL HIGH (ref 11.5–15.5)
WBC: 3.8 10*3/uL — ABNORMAL LOW (ref 4.0–10.5)
nRBC: 0 % (ref 0.0–0.2)

## 2023-09-23 LAB — COMPREHENSIVE METABOLIC PANEL WITH GFR
ALT: 13 U/L (ref 0–44)
AST: 13 U/L — ABNORMAL LOW (ref 15–41)
Albumin: 3 g/dL — ABNORMAL LOW (ref 3.5–5.0)
Alkaline Phosphatase: 82 U/L (ref 38–126)
Anion gap: 6 (ref 5–15)
BUN: 31 mg/dL — ABNORMAL HIGH (ref 8–23)
CO2: 25 mmol/L (ref 22–32)
Calcium: 8.9 mg/dL (ref 8.9–10.3)
Chloride: 103 mmol/L (ref 98–111)
Creatinine, Ser: 1.14 mg/dL — ABNORMAL HIGH (ref 0.44–1.00)
GFR, Estimated: 49 mL/min — ABNORMAL LOW (ref >60)
Glucose, Bld: 133 mg/dL — ABNORMAL HIGH (ref 70–99)
Potassium: 3.7 mmol/L (ref 3.5–5.1)
Sodium: 134 mmol/L — ABNORMAL LOW (ref 135–145)
Total Bilirubin: 1.9 mg/dL — ABNORMAL HIGH (ref 0.0–1.2)
Total Protein: 5.9 g/dL — ABNORMAL LOW (ref 6.5–8.1)

## 2023-09-30 DIAGNOSIS — Z96651 Presence of right artificial knee joint: Secondary | ICD-10-CM | POA: Diagnosis not present

## 2023-09-30 DIAGNOSIS — R262 Difficulty in walking, not elsewhere classified: Secondary | ICD-10-CM | POA: Diagnosis not present

## 2023-09-30 DIAGNOSIS — R278 Other lack of coordination: Secondary | ICD-10-CM | POA: Diagnosis not present

## 2023-09-30 DIAGNOSIS — M6281 Muscle weakness (generalized): Secondary | ICD-10-CM | POA: Diagnosis not present

## 2023-09-30 DIAGNOSIS — Z9181 History of falling: Secondary | ICD-10-CM | POA: Diagnosis not present

## 2023-09-30 DIAGNOSIS — S72434D Nondisplaced fracture of medial condyle of right femur, subsequent encounter for closed fracture with routine healing: Secondary | ICD-10-CM | POA: Diagnosis not present

## 2023-10-01 DIAGNOSIS — R262 Difficulty in walking, not elsewhere classified: Secondary | ICD-10-CM | POA: Diagnosis not present

## 2023-10-01 DIAGNOSIS — S72434D Nondisplaced fracture of medial condyle of right femur, subsequent encounter for closed fracture with routine healing: Secondary | ICD-10-CM | POA: Diagnosis not present

## 2023-10-01 DIAGNOSIS — Z96651 Presence of right artificial knee joint: Secondary | ICD-10-CM | POA: Diagnosis not present

## 2023-10-01 DIAGNOSIS — M6281 Muscle weakness (generalized): Secondary | ICD-10-CM | POA: Diagnosis not present

## 2023-10-01 DIAGNOSIS — R278 Other lack of coordination: Secondary | ICD-10-CM | POA: Diagnosis not present

## 2023-10-01 DIAGNOSIS — Z9181 History of falling: Secondary | ICD-10-CM | POA: Diagnosis not present

## 2023-10-02 DIAGNOSIS — Z9181 History of falling: Secondary | ICD-10-CM | POA: Diagnosis not present

## 2023-10-02 DIAGNOSIS — S72434D Nondisplaced fracture of medial condyle of right femur, subsequent encounter for closed fracture with routine healing: Secondary | ICD-10-CM | POA: Diagnosis not present

## 2023-10-02 DIAGNOSIS — Z96651 Presence of right artificial knee joint: Secondary | ICD-10-CM | POA: Diagnosis not present

## 2023-10-02 DIAGNOSIS — R262 Difficulty in walking, not elsewhere classified: Secondary | ICD-10-CM | POA: Diagnosis not present

## 2023-10-02 DIAGNOSIS — R278 Other lack of coordination: Secondary | ICD-10-CM | POA: Diagnosis not present

## 2023-10-02 DIAGNOSIS — M6281 Muscle weakness (generalized): Secondary | ICD-10-CM | POA: Diagnosis not present

## 2023-10-03 ENCOUNTER — Encounter: Payer: Self-pay | Admitting: Adult Health

## 2023-10-03 DIAGNOSIS — Z96651 Presence of right artificial knee joint: Secondary | ICD-10-CM | POA: Diagnosis not present

## 2023-10-03 DIAGNOSIS — S72434D Nondisplaced fracture of medial condyle of right femur, subsequent encounter for closed fracture with routine healing: Secondary | ICD-10-CM | POA: Diagnosis not present

## 2023-10-03 DIAGNOSIS — Z9181 History of falling: Secondary | ICD-10-CM | POA: Diagnosis not present

## 2023-10-03 DIAGNOSIS — R278 Other lack of coordination: Secondary | ICD-10-CM | POA: Diagnosis not present

## 2023-10-03 DIAGNOSIS — R262 Difficulty in walking, not elsewhere classified: Secondary | ICD-10-CM | POA: Diagnosis not present

## 2023-10-03 DIAGNOSIS — M6281 Muscle weakness (generalized): Secondary | ICD-10-CM | POA: Diagnosis not present

## 2023-10-03 NOTE — Progress Notes (Unsigned)
 Location:  [facility] Room Number: 136 Place of Service:  SNF (31)   CODE STATUS: dnr   Allergies  Allergen Reactions  . Ace Inhibitors Cough  . Cardizem  [Diltiazem  Hcl] Other (See Comments)    headache  . Misc. Sulfonamide Containing Compounds Other (See Comments)  . Oxycodone  Nausea Only  . Statins Other (See Comments)    myalgias  . Sulfa Antibiotics Hives and Swelling  . Tape     Paper tape is okay  . Aspirin  Rash  . Cortisone Rash    flushing  . Fd&C Yellow #5 (Tartrazine) Rash  . Latex Rash    Sensitive not allergic    Chief Complaint  Patient presents with  . Acute Visit    Care plan meeting.     HPI:  We have come together for her care plan meeting. *** BIMS mood***. She is ***. She requires ***. She is ***. Dietary: ***. Therapy ***. She continues to be followed for her chronic illnesses including: ***she will continue to be followed for her chronic illnesses including: ***  Past Medical History:  Diagnosis Date  . Anemia    hx of  . Anxiety   . Cancer [facility])    endometrial cancer/ skin cancers removed nose non melanoma  . Diabetes (HCC)    Type 2  . DJD (degenerative joint disease)   . Dysrhythmia    SYMPTOMATIC BRADYCARDIA  / A. FIB  . Family history of breast cancer   . Family history of cancer   . Gallstones   . History of radiation therapy 04/04/17-04/25/17   vaginal cuff 30 Gy in 5 fractions  . Hypertension    HCTZ  . Hypothyroidism   . Obesity   . 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 Sleep apnea    No machine Cant tolerate  . 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  . Wears glasses     Past Surgical History:  Procedure Laterality Date  . ANKLE SURGERY Right    multiple  . BIOPSY  03/11/2023   Procedure: BIOPSY;  Surgeon: Vinetta Greening, DO;  Location: [facility];  Service: Endoscopy;;  . BREAST SURGERY     biopsy L breast benign  . CHOLECYSTECTOMY     Laparoscopic but converted to open   d/t large stones  . COLONOSCOPY    . COLONOSCOPY WITH PROPOFOL  N/A 03/11/2023   Procedure: COLONOSCOPY WITH PROPOFOL ;  Surgeon: Vinetta Greening, DO;  Location: [facility];  Service: Endoscopy;  Laterality: N/A;  915am, asa 3  . 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   . EYE SURGERY     both cataracts  . FOOT ARTHROTOMY     left  . FOOT SURGERY     Bil  bone spurs /Left  foot neuroma removed Right foot sinus tarside surgery Right bone in toe removed  . KNEE SURGERY     right  . 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 PERMANENT PACEMAKER INSERTION N/A 12/15/2012   SJM 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 by Dr Carolynne Citron for transient AV block  . POLYPECTOMY  03/11/2023   Procedure: POLYPECTOMY;  Surgeon: Vinetta Greening, DO;  Location: [facility];  Service: Endoscopy;;  . ROBOTIC ASSISTED TOTAL HYSTERECTOMY WITH BILATERAL SALPINGO OOPHERECTOMY N/A 02/19/2017   Procedure: XI ROBOTIC ASSISTED TOTAL HYSTERECTOMY WITH BILATERAL  SALPINGO OOPHORECTOMY;  Surgeon: Alphonso Aschoff, MD;  Location: [facility];  Service: Gynecology;  Laterality: N/A;  . SENTINEL NODE BIOPSY N/A 02/19/2017   Procedure: SENTINEL NODE BIOPSY;  Surgeon: Alphonso Aschoff, MD;  Location: [facility];  Service: Gynecology;  Laterality: N/A;  . SHOULDER ARTHROSCOPY WITH SUBACROMIAL DECOMPRESSION AND OPEN ROTATOR C Right 07/07/2013   Procedure: RIGHT SHOULDER ARTHROSCOPY WITH SUBACROMIAL DECOMPRESSION AND ROTATOR CUFF DEBRIDEMENT/ DISTAL CLAVICLE RESECTION;  Surgeon: Amelie Baize., MD;  Location: [facility];  Service: Orthopedics;  Laterality: Right;  . SHOULDER SURGERY Right 2012   done in [place]  . TOTAL KNEE ARTHROPLASTY Right 09/02/2023   Procedure: ARTHROPLASTY, KNEE, TOTAL;  Surgeon: Liliane Rei, MD;  Location: [facility];  Service: Orthopedics;  Laterality: Right;    Social History   Socioeconomic History  . Marital status: Widowed     Spouse name: Not on file  . Number of children: 2  . Years of education: Not on file  . Highest education level: Not on file  Occupational History  . Occupation: retired  Tobacco Use  . Smoking status: Never  . Smokeless tobacco: Never  Vaping Use  . Vaping status: Never Used  Substance and Sexual Activity  . Alcohol  use: No    Alcohol /week: 0.0 standard drinks of alcohol   . Drug use: No  . Sexual activity: Not Currently    Birth control/protection: Post-menopausal  Other Topics Concern  . Not on file  Social History Narrative   Retired and lives alone in [place], Widowed now      Right handed    Wear glasses    Drinks ginger ale ( sugar free)   Social Drivers of Corporate investment banker Strain: Not on file  Food Insecurity: No Food Insecurity (09/12/2023)   Hunger Vital Sign   . Worried About Programme researcher, broadcasting/film/video in the Last Year: Never true   . Ran Out of Food in the Last Year: Never true  Transportation Needs: No Transportation Needs (09/12/2023)   PRAPARE - Transportation   . Lack of Transportation (Medical): No   . Lack of Transportation (Non-Medical): No  Physical Activity: Not on file  Stress: Not on file  Social Connections: Moderately Isolated (09/12/2023)   Social Connection and Isolation Panel [NHANES]   . Frequency of Communication with Friends and Family: More than three times a week   . Frequency of Social Gatherings with Friends and Family: More than three times a week   . Attends Religious Services: More than 4 times per year   . Active Member of Clubs or Organizations: No   . Attends Banker Meetings: Never   . Marital Status: Widowed  Intimate Partner Violence: Not At Risk (09/12/2023)   Humiliation, Afraid, Rape, and Kick questionnaire   . Fear of Current or Ex-Partner: No   . Emotionally Abused: No   . Physically Abused: No   . Sexually Abused: No   Family History  Problem Relation Age of Onset  . Sudden death Brother        age 51s,  died while playing raquetball  . Stroke Mother   . Cancer Father        throat  . Tongue cancer Father   . Heart disease Maternal Grandmother   . Cancer Maternal Aunt        type unk,hx smoking  . Cancer Maternal Uncle        type unk, hx smok  . Cancer Paternal  Aunt 35       thinks it was GI/colon- had hole in side and colostomy bag  . Cancer Maternal Aunt        type unk, had a colostomy, hx smoking  . Cancer Maternal Uncle        cancer maybe-?type unk  . Throat cancer Maternal Uncle        hx smoking  . Heart attack Maternal Uncle 50  . Cancer Cousin        type unk- died in 40's/50's  . Cancer Cousin        type unk, died in 40's/50's  . Breast cancer Cousin        x2  . Other Other 25       brain tumor  . Allergic rhinitis Neg Hx   . Angioedema Neg Hx   . Asthma Neg Hx   . Atopy Neg Hx   . Eczema Neg Hx   . Immunodeficiency Neg Hx   . Urticaria Neg Hx       VITAL SIGNS BP (!) 145/56   Pulse 80   Temp (!) 96.8 F (36 C)   Resp 20   Ht 5\' 6"  (1.676 m)   Wt 224 lb 12.8 oz (102 kg)   SpO2 97%   BMI 36.28 kg/m   Outpatient Encounter Medications as of 10/04/2023  Medication Sig  . acetaminophen  (TYLENOL ) 325 MG tablet Take 650 mg by mouth every 6 (six) hours.  . amLODipine  (NORVASC ) 5 MG tablet Take 1 tablet (5 mg total) by mouth daily.  . apixaban  (ELIQUIS ) 5 MG TABS tablet Take 1 tablet (5 mg total) by mouth 2 (two) times daily.  . bisacodyl  (DULCOLAX) 10 MG suppository Place 10 mg rectally as needed for moderate constipation.  . citalopram  (CELEXA ) 10 MG tablet Take 10 mg by mouth every Monday, Wednesday, and Friday.  . docusate sodium  (COLACE) 100 MG capsule Take 1 capsule (100 mg total) by mouth 2 (two) times daily.  . ferrous sulfate 325 (65 FE) MG tablet Take 325 mg by mouth 3 (three) times a week.  . HUMULIN  70/30 (70-30) 100 UNIT/ML injection Inject 20 Units into the skin 2 (two) times daily with a meal.  . hydrALAZINE  (APRESOLINE ) 25 MG tablet Take  3 tablets (75 mg total) by mouth every 8 (eight) hours.  . hydrochlorothiazide  (HYDRODIURIL ) 25 MG tablet Take 25 mg by mouth in the morning.  . levothyroxine  (SYNTHROID ) 112 MCG tablet TAKE 1 TABLET BY MOUTH ONCE DAILY BEFORE BREAKFAST  . loratadine  (CLARITIN ) 10 MG tablet Take 10 mg by mouth in the morning.  . methocarbamol  (ROBAXIN ) 500 MG tablet Take 1 tablet (500 mg total) by mouth every 8 (eight) hours as needed for muscle spasms.  . Multiple Vitamins-Minerals (LUTEIN-ZEAXANTHIN PO) Take 1 tablet by mouth at bedtime.  Aaron Aas olmesartan (BENICAR) 20 MG tablet Take 20 mg by mouth daily.  . ondansetron  (ZOFRAN ) 4 MG tablet Take 1 tablet (4 mg total) by mouth every 6 (six) hours as needed for nausea.  . Polyethyl Glycol-Propyl Glycol (SYSTANE) 0.4-0.3 % SOLN Place 1-2 drops into both eyes 3 (three) times daily as needed (dry/irritated eyes.).  Aaron Aas polyethylene glycol (MIRALAX  / GLYCOLAX ) 17 g packet Take 17 g by mouth daily.  . Probiotic Product (PROBIOTIC DAILY PO) Take 1 capsule by mouth in the morning.   No facility-administered encounter medications on file as of 10/04/2023.     SIGNIFICANT DIAGNOSTIC EXAMS  TODAY  09-12-23: wbc 10.7;  hgb 9.0; hct 27.1 mcv 106.7 plt 338; glucose 95; bun 20; creat 1.21; k+ 3.6; na++ 135; ca 8.9; gfr 46; protein 6.3; albumin 3.3 09-14-23: wbc 8.4; hgb 8.1; hct 25.9; mcv 108.8 plt 305; folic 8.3; vitamin B 12: 432; iron 37; tibc 286  Review of Systems  Constitutional:  Negative for malaise/fatigue.  Respiratory:  Negative for cough and shortness of breath.   Cardiovascular:  Negative for chest pain, palpitations and leg swelling.  Gastrointestinal:  Negative for abdominal pain, constipation and heartburn.  Musculoskeletal:  Negative for back pain, joint pain and myalgias.  Skin: Negative.   Neurological:  Negative for dizziness.  Psychiatric/Behavioral:  The patient is not nervous/anxious.    Physical Exam Constitutional:      General: She is not in acute  distress.    Appearance: She is well-developed. She is obese. She is not diaphoretic.  Neck:     Thyroid : No thyromegaly.  Cardiovascular:     Rate and Rhythm: Normal rate and regular rhythm.     Heart sounds: Normal heart sounds.  Pulmonary:     Effort: Pulmonary effort is normal. No respiratory distress.     Breath sounds: Normal breath sounds.  Abdominal:     General: Bowel sounds are normal. There is no distension.     Palpations: Abdomen is soft.     Tenderness: There is no abdominal tenderness.  Musculoskeletal:     Cervical back: Neck supple.     Right lower leg: Edema present.     Left lower leg: Edema present.     Comments: Right leg in immobilizer in place    Lymphadenopathy:     Cervical: No cervical adenopathy.  Skin:    General: Skin is warm and dry.  Neurological:     Mental Status: She is alert and oriented to person, place, and time.    ASSESSMENT/ PLAN:     Britt Candle NP [facility]  Contact [phone] Monday through Friday 8am- 5pm  After hours call [phone]

## 2023-10-04 ENCOUNTER — Non-Acute Institutional Stay (SKILLED_NURSING_FACILITY): Payer: Self-pay | Admitting: Adult Health

## 2023-10-04 DIAGNOSIS — E1169 Type 2 diabetes mellitus with other specified complication: Secondary | ICD-10-CM

## 2023-10-04 DIAGNOSIS — E669 Obesity, unspecified: Secondary | ICD-10-CM | POA: Diagnosis not present

## 2023-10-04 DIAGNOSIS — Z9181 History of falling: Secondary | ICD-10-CM | POA: Diagnosis not present

## 2023-10-04 DIAGNOSIS — R262 Difficulty in walking, not elsewhere classified: Secondary | ICD-10-CM | POA: Diagnosis not present

## 2023-10-04 DIAGNOSIS — M6281 Muscle weakness (generalized): Secondary | ICD-10-CM | POA: Diagnosis not present

## 2023-10-04 DIAGNOSIS — S72434D Nondisplaced fracture of medial condyle of right femur, subsequent encounter for closed fracture with routine healing: Secondary | ICD-10-CM | POA: Diagnosis not present

## 2023-10-04 DIAGNOSIS — I700 Atherosclerosis of aorta: Secondary | ICD-10-CM | POA: Diagnosis not present

## 2023-10-04 DIAGNOSIS — Z96651 Presence of right artificial knee joint: Secondary | ICD-10-CM | POA: Diagnosis not present

## 2023-10-04 DIAGNOSIS — R278 Other lack of coordination: Secondary | ICD-10-CM | POA: Diagnosis not present

## 2023-10-07 DIAGNOSIS — M6281 Muscle weakness (generalized): Secondary | ICD-10-CM | POA: Diagnosis not present

## 2023-10-07 DIAGNOSIS — R262 Difficulty in walking, not elsewhere classified: Secondary | ICD-10-CM | POA: Diagnosis not present

## 2023-10-07 DIAGNOSIS — R278 Other lack of coordination: Secondary | ICD-10-CM | POA: Diagnosis not present

## 2023-10-07 DIAGNOSIS — Z9181 History of falling: Secondary | ICD-10-CM | POA: Diagnosis not present

## 2023-10-07 DIAGNOSIS — Z96651 Presence of right artificial knee joint: Secondary | ICD-10-CM | POA: Diagnosis not present

## 2023-10-07 DIAGNOSIS — S72434D Nondisplaced fracture of medial condyle of right femur, subsequent encounter for closed fracture with routine healing: Secondary | ICD-10-CM | POA: Diagnosis not present

## 2023-10-08 DIAGNOSIS — R278 Other lack of coordination: Secondary | ICD-10-CM | POA: Diagnosis not present

## 2023-10-08 DIAGNOSIS — Z9181 History of falling: Secondary | ICD-10-CM | POA: Diagnosis not present

## 2023-10-08 DIAGNOSIS — S72434D Nondisplaced fracture of medial condyle of right femur, subsequent encounter for closed fracture with routine healing: Secondary | ICD-10-CM | POA: Diagnosis not present

## 2023-10-08 DIAGNOSIS — M9711XA Periprosthetic fracture around internal prosthetic right knee joint, initial encounter: Secondary | ICD-10-CM | POA: Diagnosis not present

## 2023-10-08 DIAGNOSIS — M6281 Muscle weakness (generalized): Secondary | ICD-10-CM | POA: Diagnosis not present

## 2023-10-08 DIAGNOSIS — R262 Difficulty in walking, not elsewhere classified: Secondary | ICD-10-CM | POA: Diagnosis not present

## 2023-10-08 DIAGNOSIS — Z96651 Presence of right artificial knee joint: Secondary | ICD-10-CM | POA: Diagnosis not present

## 2023-10-08 NOTE — Progress Notes (Signed)
 Remote pacemaker transmission.

## 2023-10-08 NOTE — Addendum Note (Signed)
 Addended by: Edra Govern D on: 10/08/2023 02:30 PM   Modules accepted: Orders

## 2023-10-09 DIAGNOSIS — R262 Difficulty in walking, not elsewhere classified: Secondary | ICD-10-CM | POA: Diagnosis not present

## 2023-10-09 DIAGNOSIS — S72434D Nondisplaced fracture of medial condyle of right femur, subsequent encounter for closed fracture with routine healing: Secondary | ICD-10-CM | POA: Diagnosis not present

## 2023-10-09 DIAGNOSIS — Z96651 Presence of right artificial knee joint: Secondary | ICD-10-CM | POA: Diagnosis not present

## 2023-10-09 DIAGNOSIS — Z9181 History of falling: Secondary | ICD-10-CM | POA: Diagnosis not present

## 2023-10-09 DIAGNOSIS — R278 Other lack of coordination: Secondary | ICD-10-CM | POA: Diagnosis not present

## 2023-10-09 DIAGNOSIS — M6281 Muscle weakness (generalized): Secondary | ICD-10-CM | POA: Diagnosis not present

## 2023-10-10 DIAGNOSIS — S72434D Nondisplaced fracture of medial condyle of right femur, subsequent encounter for closed fracture with routine healing: Secondary | ICD-10-CM | POA: Diagnosis not present

## 2023-10-10 DIAGNOSIS — R278 Other lack of coordination: Secondary | ICD-10-CM | POA: Diagnosis not present

## 2023-10-10 DIAGNOSIS — Z96651 Presence of right artificial knee joint: Secondary | ICD-10-CM | POA: Diagnosis not present

## 2023-10-10 DIAGNOSIS — Z9181 History of falling: Secondary | ICD-10-CM | POA: Diagnosis not present

## 2023-10-10 DIAGNOSIS — R262 Difficulty in walking, not elsewhere classified: Secondary | ICD-10-CM | POA: Diagnosis not present

## 2023-10-10 DIAGNOSIS — M6281 Muscle weakness (generalized): Secondary | ICD-10-CM | POA: Diagnosis not present

## 2023-10-11 DIAGNOSIS — M6281 Muscle weakness (generalized): Secondary | ICD-10-CM | POA: Diagnosis not present

## 2023-10-11 DIAGNOSIS — Z9181 History of falling: Secondary | ICD-10-CM | POA: Diagnosis not present

## 2023-10-11 DIAGNOSIS — S72434D Nondisplaced fracture of medial condyle of right femur, subsequent encounter for closed fracture with routine healing: Secondary | ICD-10-CM | POA: Diagnosis not present

## 2023-10-11 DIAGNOSIS — Z96651 Presence of right artificial knee joint: Secondary | ICD-10-CM | POA: Diagnosis not present

## 2023-10-11 DIAGNOSIS — R278 Other lack of coordination: Secondary | ICD-10-CM | POA: Diagnosis not present

## 2023-10-11 DIAGNOSIS — R262 Difficulty in walking, not elsewhere classified: Secondary | ICD-10-CM | POA: Diagnosis not present

## 2023-10-14 ENCOUNTER — Encounter: Payer: Self-pay | Admitting: Adult Health

## 2023-10-14 ENCOUNTER — Other Ambulatory Visit: Payer: Self-pay | Admitting: Adult Health

## 2023-10-14 ENCOUNTER — Non-Acute Institutional Stay (SKILLED_NURSING_FACILITY): Payer: Self-pay | Admitting: Adult Health

## 2023-10-14 DIAGNOSIS — N183 Chronic kidney disease, stage 3 unspecified: Secondary | ICD-10-CM

## 2023-10-14 DIAGNOSIS — I480 Paroxysmal atrial fibrillation: Secondary | ICD-10-CM

## 2023-10-14 DIAGNOSIS — S72434D Nondisplaced fracture of medial condyle of right femur, subsequent encounter for closed fracture with routine healing: Secondary | ICD-10-CM | POA: Diagnosis not present

## 2023-10-14 DIAGNOSIS — I700 Atherosclerosis of aorta: Secondary | ICD-10-CM

## 2023-10-14 DIAGNOSIS — E1122 Type 2 diabetes mellitus with diabetic chronic kidney disease: Secondary | ICD-10-CM | POA: Diagnosis not present

## 2023-10-14 MED ORDER — HYDRALAZINE HCL 25 MG PO TABS: 75.0000 mg | ORAL_TABLET | Freq: Three times a day (TID) | ORAL | 0 refills | Status: DC

## 2023-10-14 MED ORDER — LEVOTHYROXINE SODIUM 112 MCG PO TABS: 112.0000 ug | ORAL_TABLET | Freq: Every day | ORAL | 0 refills | Status: AC

## 2023-10-14 MED ORDER — HYDROCHLOROTHIAZIDE 25 MG PO TABS: 25.0000 mg | ORAL_TABLET | Freq: Every morning | ORAL | 0 refills | Status: AC

## 2023-10-14 MED ORDER — FERROUS SULFATE 325 (65 FE) MG PO TABS: 325.0000 mg | ORAL_TABLET | ORAL | 0 refills | Status: DC

## 2023-10-14 MED ORDER — HUMULIN 70/30 (70-30) 100 UNIT/ML ~~LOC~~ SUSP: 20.0000 [IU] | Freq: Two times a day (BID) | SUBCUTANEOUS | 0 refills | Status: AC

## 2023-10-14 MED ORDER — ONDANSETRON HCL 4 MG PO TABS: 4.0000 mg | ORAL_TABLET | Freq: Four times a day (QID) | ORAL | 0 refills | Status: DC | PRN

## 2023-10-14 MED ORDER — METHOCARBAMOL 500 MG PO TABS: 500.0000 mg | ORAL_TABLET | Freq: Three times a day (TID) | ORAL | 0 refills | Status: DC | PRN

## 2023-10-14 MED ORDER — APIXABAN 5 MG PO TABS: 5.0000 mg | ORAL_TABLET | Freq: Two times a day (BID) | ORAL | 0 refills | Status: AC

## 2023-10-14 MED ORDER — CITALOPRAM HYDROBROMIDE 10 MG PO TABS: 10.0000 mg | ORAL_TABLET | ORAL | 0 refills | Status: AC

## 2023-10-14 MED ORDER — AMLODIPINE BESYLATE 5 MG PO TABS: 5.0000 mg | ORAL_TABLET | Freq: Every day | ORAL | 0 refills | Status: DC

## 2023-10-14 NOTE — Progress Notes (Signed)
 Location:  [facility] Room Number: [id number] Place of Service:  SNF (31)   CODE STATUS: dnr   Allergies  Allergen Reactions   Ace Inhibitors Cough   Cardizem  [Diltiazem  Hcl] Other (See Comments)    headache   Misc. Sulfonamide Containing Compounds Other (See Comments)   Oxycodone  Nausea Only   Statins Other (See Comments)    myalgias   Sulfa Antibiotics Hives and Swelling   Tape     Paper tape is okay   Aspirin  Rash   Cortisone Rash    flushing   Fd&C Yellow #5 (Tartrazine) Rash   Latex Rash    Sensitive not allergic    Chief Complaint  Patient presents with   Discharge Note    HPI:  She is being discharged to home with home health for pt/ot. She will not need any dme. She will need her prescriptions written and will need to follow up with her medical provider. She had been hospitalized for a right knee replacement on 09-02-23. She had been working at that time in outpatient therapy. She had increased swelling in her right knee. There was negative workup for blood clot; did have a nondisplaced distal medial condyle femoral fracture on the right. This was non operable. She did require to have her blood pressure medications adjusted and did have uncontrolled pain. She was admitted to this facility for short term rehab. Therapy: ambulate 200 feet with rolling walker and supervision. Upper body supervision with lower body max assist. No steps; BRP: min assist; did score 44/50 on BCAT.   Past Medical History:  Diagnosis Date   Anemia    hx of   Anxiety    Cancer (HCC)    endometrial cancer/ skin cancers removed nose non melanoma   Diabetes (HCC)    Type 2   DJD (degenerative joint disease)    Dysrhythmia    SYMPTOMATIC BRADYCARDIA  / A. FIB   Family history of breast cancer    Family history of cancer    Gallstones    History of radiation therapy 04/04/17-04/25/17   vaginal cuff 30 Gy in 5 fractions   Hypertension    HCTZ   Hypothyroidism    Obesity     Pacemaker 12/15/2012   DUAL CHAMBER    DR Carolynne Citron   Sleep apnea    No machine Cant tolerate   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   Wears glasses     Past Surgical History:  Procedure Laterality Date   ANKLE SURGERY Right    multiple   BIOPSY  03/11/2023   Procedure: BIOPSY;  Surgeon: Vinetta Greening, DO;  [facility]: [facility];  Service: Endoscopy;;   BREAST SURGERY     biopsy L breast benign   CHOLECYSTECTOMY     Laparoscopic but converted to open  d/t large stones   COLONOSCOPY     COLONOSCOPY WITH PROPOFOL  N/A 03/11/2023   Procedure: COLONOSCOPY WITH PROPOFOL ;  Surgeon: Vinetta Greening, DO;  [facility]: [facility];  Service: Endoscopy;  Laterality: N/A;  915am, asa 3   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    EYE SURGERY     both cataracts   FOOT ARTHROTOMY     left   FOOT SURGERY     Bil  bone spurs /Left  foot neuroma removed Right foot sinus tarside surgery Right bone in toe removed   KNEE SURGERY     right   PACEMAKER INSERTION  12/15/2012   SJM 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 by Dr Carolynne Citron for [facility] 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   PERMANENT PACEMAKER INSERTION N/A 12/15/2012   SJM 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 by Dr Carolynne Citron for transient AV block   POLYPECTOMY  03/11/2023   Procedure: POLYPECTOMY;  Surgeon: Vinetta Greening, DO;  Location: [facility];  Service: Endoscopy;;   ROBOTIC ASSISTED TOTAL HYSTERECTOMY WITH BILATERAL SALPINGO OOPHERECTOMY N/A 02/19/2017   Procedure: XI ROBOTIC ASSISTED TOTAL HYSTERECTOMY WITH BILATERAL SALPINGO OOPHORECTOMY;  Surgeon: Alphonso Aschoff, MD;  Location: [facility];  Service: Gynecology;  Laterality: N/A;   SENTINEL NODE BIOPSY N/A 02/19/2017   Procedure: SENTINEL NODE BIOPSY;  Surgeon: Alphonso Aschoff, MD;  Location: [facility];  Service: Gynecology;  Laterality: N/A;   SHOULDER ARTHROSCOPY WITH SUBACROMIAL DECOMPRESSION AND OPEN ROTATOR C Right 07/07/2013   Procedure: RIGHT SHOULDER ARTHROSCOPY WITH SUBACROMIAL DECOMPRESSION AND ROTATOR  CUFF DEBRIDEMENT/ DISTAL CLAVICLE RESECTION;  Surgeon: Amelie Baize., MD;  Location: [facility];  Service: Orthopedics;  Laterality: Right;   SHOULDER SURGERY Right 2012   done in [place]   TOTAL KNEE ARTHROPLASTY Right 09/02/2023   Procedure: ARTHROPLASTY, KNEE, TOTAL;  Surgeon: Liliane Rei, MD;  Location: [facility];  Service: Orthopedics;  Laterality: Right;    Social History   Socioeconomic History   Marital status: Widowed    Spouse name: Not on file   Number of children: 2   Years of education: Not on file   Highest education level: Not on file  Occupational History   Occupation: retired  Tobacco Use   Smoking status: Never   Smokeless tobacco: Never  Vaping Use   Vaping status: Never Used  Substance and Sexual Activity   Alcohol  use: No    Alcohol /week: 0.0 standard drinks of alcohol    Drug use: No   Sexual activity: Not Currently    Birth control/protection: Post-menopausal  Other Topics Concern   Not on file  Social History Narrative   Retired and lives alone in [place], Widowed now      Right handed    Wear glasses    Drinks ginger ale ( sugar free)   Social Drivers of Corporate investment banker Strain: Not on file  Food Insecurity: No Food Insecurity (09/12/2023)   Hunger Vital Sign    Worried About Running Out of Food in the Last Year: Never true    Ran Out of Food in the Last Year: Never true  Transportation Needs: No Transportation Needs (09/12/2023)   PRAPARE - Administrator, Civil Service (Medical): No    Lack of Transportation (Non-Medical): No  Physical Activity: Not on file  Stress: Not on file  Social Connections: Moderately Isolated (09/12/2023)   Social Connection and Isolation Panel [NHANES]    Frequency of Communication with Friends and Family: More than three times a week    Frequency of Social Gatherings with Friends and Family: More than three times a week    Attends Religious Services: More than 4 times per year     Active Member of [employer] or Organizations: No    Attends Banker Meetings: Never    Marital Status: Widowed  Intimate Partner Violence: Not At Risk (09/12/2023)   Humiliation, Afraid, Rape, and Kick questionnaire    Fear of Current or Ex-Partner: No    Emotionally Abused: No    Physically Abused: No    Sexually Abused: No   Family History  Problem Relation Age of  Onset   Sudden death Brother        age 108s, died while playing raquetball   Stroke Mother    Cancer Father        throat   Tongue cancer Father    Heart disease Maternal Grandmother    Cancer Maternal Aunt        type unk,hx smoking   Cancer Maternal Uncle        type unk, hx smok   Cancer Paternal Aunt 12       thinks it was GI/colon- had hole in side and colostomy bag   Cancer Maternal Aunt        type unk, had a colostomy, hx smoking   Cancer Maternal Uncle        cancer maybe-?type unk   Throat cancer Maternal Uncle        hx smoking   Heart attack Maternal Uncle 50   Cancer Cousin        type unk- died in 77's/50's   Cancer Cousin        type unk, died in 43's/50's   Breast cancer Cousin        x2   Other Other 25       brain tumor   Allergic rhinitis Neg Hx    Angioedema Neg Hx    Asthma Neg Hx    Atopy Neg Hx    Eczema Neg Hx    Immunodeficiency Neg Hx    Urticaria Neg Hx       VITAL SIGNS BP (!) 127/59   Pulse 65   Temp (!) 97.3 F (36.3 C)   Resp 20   Ht 5\' 6"  (1.676 m)   Wt 222 lb 3.2 oz (100.8 kg)   SpO2 95%   BMI 35.86 kg/m   Outpatient Encounter Medications as of 10/14/2023  Medication Sig   acetaminophen  (TYLENOL ) 325 MG tablet Take 650 mg by mouth every 6 (six) hours.   amLODipine  (NORVASC ) 5 MG tablet Take 1 tablet (5 mg total) by mouth daily.   apixaban  (ELIQUIS ) 5 MG TABS tablet Take 1 tablet (5 mg total) by mouth 2 (two) times daily.   bisacodyl  (DULCOLAX) 10 MG suppository Place 10 mg rectally as needed for moderate constipation.   citalopram  (CELEXA ) 10 MG  tablet Take 1 tablet (10 mg total) by mouth every Monday, Wednesday, and Friday.   docusate sodium  (COLACE) 100 MG capsule Take 1 capsule (100 mg total) by mouth 2 (two) times daily.   ferrous sulfate 325 (65 FE) MG tablet Take 1 tablet (325 mg total) by mouth 3 (three) times a week.   HUMULIN  70/30 (70-30) 100 UNIT/ML injection Inject 20 Units into the skin 2 (two) times daily with a meal.   hydrALAZINE  (APRESOLINE ) 25 MG tablet Take 3 tablets (75 mg total) by mouth every 8 (eight) hours.   hydrochlorothiazide  (HYDRODIURIL ) 25 MG tablet Take 1 tablet (25 mg total) by mouth in the morning.   levothyroxine  (SYNTHROID ) 112 MCG tablet Take 1 tablet (112 mcg total) by mouth daily before breakfast.   loratadine  (CLARITIN ) 10 MG tablet Take 10 mg by mouth in the morning.   methocarbamol  (ROBAXIN ) 500 MG tablet Take 1 tablet (500 mg total) by mouth every 8 (eight) hours as needed for muscle spasms.   Multiple Vitamins-Minerals (LUTEIN-ZEAXANTHIN PO) Take 1 tablet by mouth at bedtime.   olmesartan (BENICAR) 20 MG tablet Take 20 mg by mouth daily.   ondansetron  (ZOFRAN ) 4 MG  tablet Take 1 tablet (4 mg total) by mouth every 6 (six) hours as needed for nausea.   Polyethyl Glycol-Propyl Glycol (SYSTANE) 0.4-0.3 % SOLN Place 1-2 drops into both eyes 3 (three) times daily as needed (dry/irritated eyes.).   polyethylene glycol (MIRALAX  / GLYCOLAX ) 17 g packet Take 17 g by mouth daily.   Probiotic Product (PROBIOTIC DAILY PO) Take 1 capsule by mouth in the morning.   No facility-administered encounter medications on file as of 10/14/2023.     SIGNIFICANT DIAGNOSTIC EXAMS  TODAY  09-12-23: wbc 10.7; hgb 9.0; hct 27.1 mcv 106.7 plt 338; glucose 95; bun 20; creat 1.21; k+ 3.6; na++ 135; ca 8.9; gfr 46; protein 6.3; albumin 3.3 09-14-23: wbc 8.4; hgb 8.1; hct 25.9; mcv 108.8 plt 305; folic 8.3; vitamin B 12: 432; iron 37; tibc 286  Review of Systems  Constitutional:  Negative for malaise/fatigue.  Respiratory:   Negative for cough and shortness of breath.   Cardiovascular:  Negative for chest pain, palpitations and leg swelling.  Gastrointestinal:  Negative for abdominal pain, constipation and heartburn.  Musculoskeletal:  Negative for back pain, joint pain and myalgias.  Skin: Negative.   Neurological:  Negative for dizziness.  Psychiatric/Behavioral:  The patient is not nervous/anxious.    Physical Exam Constitutional:      General: She is not in acute distress.    Appearance: She is well-developed. She is obese. She is not diaphoretic.  Neck:     Thyroid : No thyromegaly.  Cardiovascular:     Rate and Rhythm: Normal rate and regular rhythm.     Heart sounds: Normal heart sounds.  Pulmonary:     Effort: Pulmonary effort is normal. No respiratory distress.     Breath sounds: Normal breath sounds.  Abdominal:     General: Bowel sounds are normal. There is no distension.     Palpations: Abdomen is soft.     Tenderness: There is no abdominal tenderness.  Musculoskeletal:     Cervical back: Neck supple.     Right lower leg: No edema.     Left lower leg: No edema.     Comments: Right leg in immobilizer in place     Lymphadenopathy:     Cervical: No cervical adenopathy.  Skin:    General: Skin is warm and dry.  Neurological:     Mental Status: She is alert and oriented to person, place, and time.  Psychiatric:        Mood and Affect: Mood normal.    ASSESSMENT/ PLAN:   Patient is being discharged with the following home health services:  pt/ot to evaluate and treat as indicated for gait balance strength adl training.   Patient is being discharged with the following durable medical equipment:  none needed has walker   Patient has been advised to f/u with their PCP in 1-2 weeks to for a transitions of care visit.  Social services at their facility was responsible for arranging this appointment.  Pt was provided with adequate prescriptions of noncontrolled medications to reach the  scheduled appointment .  For controlled substances, a limited supply was provided as appropriate for the individual patient.  If the pt normally receives these medications from a pain clinic or has a contract with another physician, these medications should be received from that clinic or physician only).    A 30 day supply of her prescription medications have been sent to [facility]   Time spent with patient: 40 minutes: home health; medications;  dme.   Britt Candle NP [facility]  call [phone]

## 2023-10-15 DIAGNOSIS — M6281 Muscle weakness (generalized): Secondary | ICD-10-CM | POA: Diagnosis not present

## 2023-10-15 DIAGNOSIS — R262 Difficulty in walking, not elsewhere classified: Secondary | ICD-10-CM | POA: Diagnosis not present

## 2023-10-15 DIAGNOSIS — M25561 Pain in right knee: Secondary | ICD-10-CM | POA: Diagnosis not present

## 2023-10-15 DIAGNOSIS — Z471 Aftercare following joint replacement surgery: Secondary | ICD-10-CM | POA: Diagnosis not present

## 2023-10-18 DIAGNOSIS — R262 Difficulty in walking, not elsewhere classified: Secondary | ICD-10-CM | POA: Diagnosis not present

## 2023-10-18 DIAGNOSIS — M6281 Muscle weakness (generalized): Secondary | ICD-10-CM | POA: Diagnosis not present

## 2023-10-18 DIAGNOSIS — Z471 Aftercare following joint replacement surgery: Secondary | ICD-10-CM | POA: Diagnosis not present

## 2023-10-18 DIAGNOSIS — M25561 Pain in right knee: Secondary | ICD-10-CM | POA: Diagnosis not present

## 2023-10-22 DIAGNOSIS — R262 Difficulty in walking, not elsewhere classified: Secondary | ICD-10-CM | POA: Diagnosis not present

## 2023-10-22 DIAGNOSIS — M25561 Pain in right knee: Secondary | ICD-10-CM | POA: Diagnosis not present

## 2023-10-22 DIAGNOSIS — M6281 Muscle weakness (generalized): Secondary | ICD-10-CM | POA: Diagnosis not present

## 2023-10-22 DIAGNOSIS — Z471 Aftercare following joint replacement surgery: Secondary | ICD-10-CM | POA: Diagnosis not present

## 2023-10-25 DIAGNOSIS — M25561 Pain in right knee: Secondary | ICD-10-CM | POA: Diagnosis not present

## 2023-10-25 DIAGNOSIS — Z471 Aftercare following joint replacement surgery: Secondary | ICD-10-CM | POA: Diagnosis not present

## 2023-10-25 DIAGNOSIS — M6281 Muscle weakness (generalized): Secondary | ICD-10-CM | POA: Diagnosis not present

## 2023-10-25 DIAGNOSIS — R262 Difficulty in walking, not elsewhere classified: Secondary | ICD-10-CM | POA: Diagnosis not present

## 2023-10-28 DIAGNOSIS — M6281 Muscle weakness (generalized): Secondary | ICD-10-CM | POA: Diagnosis not present

## 2023-10-28 DIAGNOSIS — R262 Difficulty in walking, not elsewhere classified: Secondary | ICD-10-CM | POA: Diagnosis not present

## 2023-10-28 DIAGNOSIS — Z471 Aftercare following joint replacement surgery: Secondary | ICD-10-CM | POA: Diagnosis not present

## 2023-10-28 DIAGNOSIS — M25561 Pain in right knee: Secondary | ICD-10-CM | POA: Diagnosis not present

## 2023-10-30 DIAGNOSIS — M6281 Muscle weakness (generalized): Secondary | ICD-10-CM | POA: Diagnosis not present

## 2023-10-30 DIAGNOSIS — R262 Difficulty in walking, not elsewhere classified: Secondary | ICD-10-CM | POA: Diagnosis not present

## 2023-10-30 DIAGNOSIS — Z471 Aftercare following joint replacement surgery: Secondary | ICD-10-CM | POA: Diagnosis not present

## 2023-10-30 DIAGNOSIS — M25561 Pain in right knee: Secondary | ICD-10-CM | POA: Diagnosis not present

## 2023-10-31 DIAGNOSIS — M9711XA Periprosthetic fracture around internal prosthetic right knee joint, initial encounter: Secondary | ICD-10-CM | POA: Diagnosis not present

## 2023-11-04 DIAGNOSIS — M25561 Pain in right knee: Secondary | ICD-10-CM | POA: Diagnosis not present

## 2023-11-04 DIAGNOSIS — M6281 Muscle weakness (generalized): Secondary | ICD-10-CM | POA: Diagnosis not present

## 2023-11-04 DIAGNOSIS — R262 Difficulty in walking, not elsewhere classified: Secondary | ICD-10-CM | POA: Diagnosis not present

## 2023-11-04 DIAGNOSIS — Z471 Aftercare following joint replacement surgery: Secondary | ICD-10-CM | POA: Diagnosis not present

## 2023-11-05 DIAGNOSIS — I10 Essential (primary) hypertension: Secondary | ICD-10-CM | POA: Diagnosis not present

## 2023-11-05 DIAGNOSIS — I482 Chronic atrial fibrillation, unspecified: Secondary | ICD-10-CM | POA: Diagnosis not present

## 2023-11-05 DIAGNOSIS — E1122 Type 2 diabetes mellitus with diabetic chronic kidney disease: Secondary | ICD-10-CM | POA: Diagnosis not present

## 2023-11-05 DIAGNOSIS — E782 Mixed hyperlipidemia: Secondary | ICD-10-CM | POA: Diagnosis not present

## 2023-11-06 DIAGNOSIS — Z471 Aftercare following joint replacement surgery: Secondary | ICD-10-CM | POA: Diagnosis not present

## 2023-11-06 DIAGNOSIS — M6281 Muscle weakness (generalized): Secondary | ICD-10-CM | POA: Diagnosis not present

## 2023-11-06 DIAGNOSIS — R262 Difficulty in walking, not elsewhere classified: Secondary | ICD-10-CM | POA: Diagnosis not present

## 2023-11-06 DIAGNOSIS — M25561 Pain in right knee: Secondary | ICD-10-CM | POA: Diagnosis not present

## 2023-11-11 DIAGNOSIS — R262 Difficulty in walking, not elsewhere classified: Secondary | ICD-10-CM | POA: Diagnosis not present

## 2023-11-11 DIAGNOSIS — M6281 Muscle weakness (generalized): Secondary | ICD-10-CM | POA: Diagnosis not present

## 2023-11-11 DIAGNOSIS — M25561 Pain in right knee: Secondary | ICD-10-CM | POA: Diagnosis not present

## 2023-11-11 DIAGNOSIS — Z471 Aftercare following joint replacement surgery: Secondary | ICD-10-CM | POA: Diagnosis not present

## 2023-11-13 DIAGNOSIS — M6281 Muscle weakness (generalized): Secondary | ICD-10-CM | POA: Diagnosis not present

## 2023-11-13 DIAGNOSIS — M25561 Pain in right knee: Secondary | ICD-10-CM | POA: Diagnosis not present

## 2023-11-13 DIAGNOSIS — R262 Difficulty in walking, not elsewhere classified: Secondary | ICD-10-CM | POA: Diagnosis not present

## 2023-11-13 DIAGNOSIS — Z471 Aftercare following joint replacement surgery: Secondary | ICD-10-CM | POA: Diagnosis not present

## 2023-11-18 ENCOUNTER — Ambulatory Visit: Payer: Medicare Other

## 2023-11-18 DIAGNOSIS — I441 Atrioventricular block, second degree: Secondary | ICD-10-CM | POA: Diagnosis not present

## 2023-11-18 LAB — CUP PACEART REMOTE DEVICE CHECK
Battery Remaining Longevity: 14 mo
Battery Remaining Percentage: 11 %
Battery Voltage: 2.83 V
Brady Statistic AP VP Percent: 1 %
Brady Statistic AP VS Percent: 44 %
Brady Statistic AS VP Percent: 1 %
Brady Statistic AS VS Percent: 56 %
Brady Statistic RA Percent Paced: 41 %
Brady Statistic RV Percent Paced: 1 %
Date Time Interrogation Session: 20250714034817
Implantable Lead Connection Status: 753985
Implantable Lead Connection Status: 753985
Implantable Lead Implant Date: 20140811
Implantable Lead Implant Date: 20140811
Implantable Lead Location: 753859
Implantable Lead Location: 753860
Implantable Pulse Generator Implant Date: 20140811
Lead Channel Impedance Value: 350 Ohm
Lead Channel Impedance Value: 430 Ohm
Lead Channel Pacing Threshold Amplitude: 0.625 V
Lead Channel Pacing Threshold Amplitude: 0.75 V
Lead Channel Pacing Threshold Pulse Width: 0.4 ms
Lead Channel Pacing Threshold Pulse Width: 0.4 ms
Lead Channel Sensing Intrinsic Amplitude: 12 mV
Lead Channel Sensing Intrinsic Amplitude: 3.4 mV
Lead Channel Setting Pacing Amplitude: 1 V
Lead Channel Setting Pacing Amplitude: 1.625
Lead Channel Setting Pacing Pulse Width: 0.4 ms
Lead Channel Setting Sensing Sensitivity: 2 mV
Pulse Gen Model: 2240
Pulse Gen Serial Number: 7528878

## 2023-11-19 DIAGNOSIS — Z471 Aftercare following joint replacement surgery: Secondary | ICD-10-CM | POA: Diagnosis not present

## 2023-11-19 DIAGNOSIS — M6281 Muscle weakness (generalized): Secondary | ICD-10-CM | POA: Diagnosis not present

## 2023-11-19 DIAGNOSIS — M25561 Pain in right knee: Secondary | ICD-10-CM | POA: Diagnosis not present

## 2023-11-19 DIAGNOSIS — R262 Difficulty in walking, not elsewhere classified: Secondary | ICD-10-CM | POA: Diagnosis not present

## 2023-11-20 ENCOUNTER — Ambulatory Visit: Payer: Self-pay | Admitting: Cardiovascular Disease

## 2023-11-21 DIAGNOSIS — M25561 Pain in right knee: Secondary | ICD-10-CM | POA: Diagnosis not present

## 2023-11-21 DIAGNOSIS — Z471 Aftercare following joint replacement surgery: Secondary | ICD-10-CM | POA: Diagnosis not present

## 2023-11-21 DIAGNOSIS — M6281 Muscle weakness (generalized): Secondary | ICD-10-CM | POA: Diagnosis not present

## 2023-11-21 DIAGNOSIS — R262 Difficulty in walking, not elsewhere classified: Secondary | ICD-10-CM | POA: Diagnosis not present

## 2023-11-26 DIAGNOSIS — M79671 Pain in right foot: Secondary | ICD-10-CM | POA: Diagnosis not present

## 2023-11-26 DIAGNOSIS — E1151 Type 2 diabetes mellitus with diabetic peripheral angiopathy without gangrene: Secondary | ICD-10-CM | POA: Diagnosis not present

## 2023-11-26 DIAGNOSIS — M79672 Pain in left foot: Secondary | ICD-10-CM | POA: Diagnosis not present

## 2023-11-26 DIAGNOSIS — L600 Ingrowing nail: Secondary | ICD-10-CM | POA: Diagnosis not present

## 2023-11-26 DIAGNOSIS — E1140 Type 2 diabetes mellitus with diabetic neuropathy, unspecified: Secondary | ICD-10-CM | POA: Diagnosis not present

## 2023-11-26 DIAGNOSIS — M79674 Pain in right toe(s): Secondary | ICD-10-CM | POA: Diagnosis not present

## 2023-11-26 DIAGNOSIS — M79675 Pain in left toe(s): Secondary | ICD-10-CM | POA: Diagnosis not present

## 2023-11-27 DIAGNOSIS — E7849 Other hyperlipidemia: Secondary | ICD-10-CM | POA: Diagnosis not present

## 2023-11-27 DIAGNOSIS — E039 Hypothyroidism, unspecified: Secondary | ICD-10-CM | POA: Diagnosis not present

## 2023-11-27 DIAGNOSIS — E1122 Type 2 diabetes mellitus with diabetic chronic kidney disease: Secondary | ICD-10-CM | POA: Diagnosis not present

## 2023-11-27 DIAGNOSIS — I10 Essential (primary) hypertension: Secondary | ICD-10-CM | POA: Diagnosis not present

## 2023-11-27 DIAGNOSIS — N1831 Chronic kidney disease, stage 3a: Secondary | ICD-10-CM | POA: Diagnosis not present

## 2023-11-28 DIAGNOSIS — N1831 Chronic kidney disease, stage 3a: Secondary | ICD-10-CM | POA: Diagnosis not present

## 2023-11-28 DIAGNOSIS — M1711 Unilateral primary osteoarthritis, right knee: Secondary | ICD-10-CM | POA: Diagnosis not present

## 2023-11-28 DIAGNOSIS — Z794 Long term (current) use of insulin: Secondary | ICD-10-CM | POA: Diagnosis not present

## 2023-11-28 DIAGNOSIS — E1142 Type 2 diabetes mellitus with diabetic polyneuropathy: Secondary | ICD-10-CM | POA: Diagnosis not present

## 2023-11-28 DIAGNOSIS — J309 Allergic rhinitis, unspecified: Secondary | ICD-10-CM | POA: Diagnosis not present

## 2023-11-28 DIAGNOSIS — E1122 Type 2 diabetes mellitus with diabetic chronic kidney disease: Secondary | ICD-10-CM | POA: Diagnosis not present

## 2023-11-28 DIAGNOSIS — I482 Chronic atrial fibrillation, unspecified: Secondary | ICD-10-CM | POA: Diagnosis not present

## 2023-11-28 DIAGNOSIS — K529 Noninfective gastroenteritis and colitis, unspecified: Secondary | ICD-10-CM | POA: Diagnosis not present

## 2023-11-28 DIAGNOSIS — E782 Mixed hyperlipidemia: Secondary | ICD-10-CM | POA: Diagnosis not present

## 2023-11-28 DIAGNOSIS — E039 Hypothyroidism, unspecified: Secondary | ICD-10-CM | POA: Diagnosis not present

## 2023-11-28 DIAGNOSIS — I10 Essential (primary) hypertension: Secondary | ICD-10-CM | POA: Diagnosis not present

## 2023-12-04 DIAGNOSIS — L814 Other melanin hyperpigmentation: Secondary | ICD-10-CM | POA: Diagnosis not present

## 2023-12-04 DIAGNOSIS — L719 Rosacea, unspecified: Secondary | ICD-10-CM | POA: Diagnosis not present

## 2023-12-04 DIAGNOSIS — L821 Other seborrheic keratosis: Secondary | ICD-10-CM | POA: Diagnosis not present

## 2023-12-06 ENCOUNTER — Encounter: Payer: Medicare Other | Admitting: Cardiovascular Disease

## 2023-12-06 DIAGNOSIS — E039 Hypothyroidism, unspecified: Secondary | ICD-10-CM | POA: Diagnosis not present

## 2023-12-06 DIAGNOSIS — E782 Mixed hyperlipidemia: Secondary | ICD-10-CM | POA: Diagnosis not present

## 2023-12-06 DIAGNOSIS — E7849 Other hyperlipidemia: Secondary | ICD-10-CM | POA: Diagnosis not present

## 2023-12-06 DIAGNOSIS — I10 Essential (primary) hypertension: Secondary | ICD-10-CM | POA: Diagnosis not present

## 2023-12-06 DIAGNOSIS — E1122 Type 2 diabetes mellitus with diabetic chronic kidney disease: Secondary | ICD-10-CM | POA: Diagnosis not present

## 2023-12-11 DIAGNOSIS — M7061 Trochanteric bursitis, right hip: Secondary | ICD-10-CM | POA: Diagnosis not present

## 2023-12-11 DIAGNOSIS — Z5189 Encounter for other specified aftercare: Secondary | ICD-10-CM | POA: Diagnosis not present

## 2023-12-11 DIAGNOSIS — M9711XA Periprosthetic fracture around internal prosthetic right knee joint, initial encounter: Secondary | ICD-10-CM | POA: Diagnosis not present

## 2023-12-24 DIAGNOSIS — E039 Hypothyroidism, unspecified: Secondary | ICD-10-CM | POA: Diagnosis not present

## 2023-12-24 DIAGNOSIS — I10 Essential (primary) hypertension: Secondary | ICD-10-CM | POA: Diagnosis not present

## 2023-12-24 DIAGNOSIS — I482 Chronic atrial fibrillation, unspecified: Secondary | ICD-10-CM | POA: Diagnosis not present

## 2023-12-24 DIAGNOSIS — E1142 Type 2 diabetes mellitus with diabetic polyneuropathy: Secondary | ICD-10-CM | POA: Diagnosis not present

## 2023-12-24 DIAGNOSIS — N1831 Chronic kidney disease, stage 3a: Secondary | ICD-10-CM | POA: Diagnosis not present

## 2023-12-24 DIAGNOSIS — K529 Noninfective gastroenteritis and colitis, unspecified: Secondary | ICD-10-CM | POA: Diagnosis not present

## 2023-12-24 DIAGNOSIS — Z794 Long term (current) use of insulin: Secondary | ICD-10-CM | POA: Diagnosis not present

## 2023-12-24 DIAGNOSIS — E1122 Type 2 diabetes mellitus with diabetic chronic kidney disease: Secondary | ICD-10-CM | POA: Diagnosis not present

## 2023-12-24 DIAGNOSIS — M1711 Unilateral primary osteoarthritis, right knee: Secondary | ICD-10-CM | POA: Diagnosis not present

## 2023-12-24 DIAGNOSIS — J309 Allergic rhinitis, unspecified: Secondary | ICD-10-CM | POA: Diagnosis not present

## 2023-12-24 DIAGNOSIS — E782 Mixed hyperlipidemia: Secondary | ICD-10-CM | POA: Diagnosis not present

## 2024-01-06 DIAGNOSIS — E782 Mixed hyperlipidemia: Secondary | ICD-10-CM | POA: Diagnosis not present

## 2024-01-06 DIAGNOSIS — E1122 Type 2 diabetes mellitus with diabetic chronic kidney disease: Secondary | ICD-10-CM | POA: Diagnosis not present

## 2024-01-06 DIAGNOSIS — I10 Essential (primary) hypertension: Secondary | ICD-10-CM | POA: Diagnosis not present

## 2024-01-14 DIAGNOSIS — E119 Type 2 diabetes mellitus without complications: Secondary | ICD-10-CM | POA: Diagnosis not present

## 2024-01-14 DIAGNOSIS — L719 Rosacea, unspecified: Secondary | ICD-10-CM | POA: Diagnosis not present

## 2024-01-14 DIAGNOSIS — H353132 Nonexudative age-related macular degeneration, bilateral, intermediate dry stage: Secondary | ICD-10-CM | POA: Diagnosis not present

## 2024-01-14 DIAGNOSIS — H04123 Dry eye syndrome of bilateral lacrimal glands: Secondary | ICD-10-CM | POA: Diagnosis not present

## 2024-01-21 DIAGNOSIS — E1142 Type 2 diabetes mellitus with diabetic polyneuropathy: Secondary | ICD-10-CM | POA: Diagnosis not present

## 2024-01-21 DIAGNOSIS — E782 Mixed hyperlipidemia: Secondary | ICD-10-CM | POA: Diagnosis not present

## 2024-01-21 DIAGNOSIS — J309 Allergic rhinitis, unspecified: Secondary | ICD-10-CM | POA: Diagnosis not present

## 2024-01-21 DIAGNOSIS — Z794 Long term (current) use of insulin: Secondary | ICD-10-CM | POA: Diagnosis not present

## 2024-01-21 DIAGNOSIS — I10 Essential (primary) hypertension: Secondary | ICD-10-CM | POA: Diagnosis not present

## 2024-01-21 DIAGNOSIS — N1831 Chronic kidney disease, stage 3a: Secondary | ICD-10-CM | POA: Diagnosis not present

## 2024-01-21 DIAGNOSIS — E039 Hypothyroidism, unspecified: Secondary | ICD-10-CM | POA: Diagnosis not present

## 2024-01-21 DIAGNOSIS — E1122 Type 2 diabetes mellitus with diabetic chronic kidney disease: Secondary | ICD-10-CM | POA: Diagnosis not present

## 2024-01-21 DIAGNOSIS — K529 Noninfective gastroenteritis and colitis, unspecified: Secondary | ICD-10-CM | POA: Diagnosis not present

## 2024-01-21 DIAGNOSIS — I482 Chronic atrial fibrillation, unspecified: Secondary | ICD-10-CM | POA: Diagnosis not present

## 2024-01-21 DIAGNOSIS — M1711 Unilateral primary osteoarthritis, right knee: Secondary | ICD-10-CM | POA: Diagnosis not present

## 2024-01-29 ENCOUNTER — Telehealth: Payer: Self-pay | Admitting: Cardiovascular Disease

## 2024-01-29 NOTE — Telephone Encounter (Signed)
 Mrs. Zappia called regarding office visit w/[facility] on 05/17/23 with a device check.  On 05/20/23 she had a remote device check. She is questioning why she had both (her insurance is indicating copays for both dates). I have reached out to the device clinic for advice and at their response, I have asked them to follow-up with her for the reasoning.

## 2024-02-04 NOTE — Telephone Encounter (Signed)
LMOVM requesting call back. Direct number provided.

## 2024-02-13 NOTE — Progress Notes (Signed)
 Remote PPM Transmission

## 2024-02-14 ENCOUNTER — Telehealth: Payer: Self-pay | Admitting: *Deleted

## 2024-02-14 DIAGNOSIS — Z5189 Encounter for other specified aftercare: Secondary | ICD-10-CM | POA: Diagnosis not present

## 2024-02-14 DIAGNOSIS — T8482XD Fibrosis due to internal orthopedic prosthetic devices, implants and grafts, subsequent encounter: Secondary | ICD-10-CM | POA: Diagnosis not present

## 2024-02-14 DIAGNOSIS — M9711XA Periprosthetic fracture around internal prosthetic right knee joint, initial encounter: Secondary | ICD-10-CM | POA: Diagnosis not present

## 2024-02-14 NOTE — Telephone Encounter (Signed)
   Pre-operative Risk Assessment    Patient Name: Alicia [name]  DOB: June 02, 1945 MRN: [id number]   Date of last office visit: 05/17/2023 Date of next office visit: N/A   Request for Surgical Clearance    Procedure:  RIGHT KNEE CLOSED MANIPULATION  Date of Surgery:  Clearance 02/24/24                                Surgeon:  DR. DEMPSEY MOAN Surgeon's Group or Practice Name:  [facility] Phone number:  [phone] Fax number:  [phone] KERRI MAZE   Type of Clearance Requested:   - Medical  - Pharmacy:  Hold Apixaban  (Eliquis )  PLEASE GIVE INSTRUCTIONS ON STOPPING ELIQUIS  PRIOR TO SURGERY   Type of Anesthesia:  CHOICE   Additional requests/questions:    Bonney Apolinar [place]   02/14/2024, 4:54 PM

## 2024-02-17 ENCOUNTER — Encounter: Payer: Self-pay | Admitting: Cardiovascular Disease

## 2024-02-17 ENCOUNTER — Ambulatory Visit: Payer: Medicare Other

## 2024-02-17 DIAGNOSIS — I441 Atrioventricular block, second degree: Secondary | ICD-10-CM | POA: Diagnosis not present

## 2024-02-17 NOTE — H&P (Signed)
 ORTHOPEDIC H&P  Patient presents for closed manipulation under anesthesia of right total knee arthroplasty.  Subjective:  Chief Complaint: Right knee stiffness  Alicia [name] is a 78 year old female who presents for a recheck following a right total knee arthroplasty and subsequent periprosthetic medial condyle fracture of the femur. She reports persistent stiffness in the right knee, which limits her ability to bend the knee beyond approximately 100 degrees. She uses a cane for short outings, such as grocery shopping, and a walker for longer distances or crowded settings, such as church, to prevent falls. At home, she is able to ambulate without assistive devices. She denies swelling or fluid accumulation in the knee. She expresses concern about undergoing further intervention due to her age and prior experiences with rehabilitation following surgery. Her last therapy was conducted at [facility] Jaqu[facility] outpatient facility, where Alicia [name] worked with her. She has not undergone therapy since completing rehabilitation.  Patient Active Problem List   Diagnosis Date Noted   CKD stage 3 due to type 2 diabetes mellitus (HCC) 09/18/2023   Aortic atherosclerosis 09/17/2023   Chronic constipation 09/17/2023   Chronic non-seasonal allergic rhinitis 09/17/2023   Acute blood loss as cause of postoperative anemia 09/17/2023   Closed fracture of medial condyle of right femur (HCC) 09/12/2023   Uncontrolled pain 09/12/2023   Primary osteoarthritis of right knee 09/02/2023   Diarrhea 01/10/2023   Other fatigue 01/10/2023   Genetic testing 07/04/2017   Osteoarthritis of knee 06/11/2017   Family history of cancer    Family history of breast cancer    Endometrial cancer (HCC) 02/19/2017   Endometrial ca (HCC) 01/16/2017   Cervical polyp 12/12/2016   PMB (postmenopausal bleeding) 12/12/2016   Dermatitis 09/19/2015   Allergic urticaria 09/19/2015   Hypothyroidism 10/05/2014   Morbid obesity (HCC) 04/17/2014    Type 2 diabetes mellitus with obesity 10/02/2013   Disorder of bursae and tendons in shoulder region 07/07/2013   A-fib (HCC) 07/03/2013   Second degree Mobitz II AV block 03/18/2013   Syncope 03/18/2013   Uncontrolled hypertension 03/18/2013   Pacemaker 12/05/2012    Past Medical History:  Diagnosis Date   Anemia    hx of   Anxiety    Cancer (HCC)    endometrial cancer/ skin cancers removed nose non melanoma   Diabetes (HCC)    Type 2   DJD (degenerative joint disease)    Dysrhythmia    SYMPTOMATIC BRADYCARDIA  / A. FIB   Family history of breast cancer    Family history of cancer    Gallstones    History of radiation therapy 04/04/17-04/25/17   vaginal cuff 30 Gy in 5 fractions   Hypertension    HCTZ   Hypothyroidism    Obesity    Pacemaker 12/15/2012   DUAL CHAMBER    DR WADDELL   Sleep apnea    No machine Cant tolerate   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   Wears glasses     Past Surgical History:  Procedure Laterality Date   ANKLE SURGERY Right    multiple   BIOPSY  03/11/2023   Procedure: BIOPSY;  Surgeon: Cindie Carlin POUR, DO;  [facility];  Service: Endoscopy;;   BREAST SURGERY     biopsy L breast benign   CHOLECYSTECTOMY     Laparoscopic but converted to open  d/t large stones   COLONOSCOPY     COLONOSCOPY WITH PROPOFOL  N/A 03/11/2023   Procedure: COLONOSCOPY WITH PROPOFOL ;  Surgeon: Cindie,  Carlin POUR, DO;  Location[facility];  Service: Endoscopy;  Laterality: N/A;  915am, asa 3   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    EYE SURGERY     both cataracts   FOOT ARTHROTOMY     left   FOOT SURGERY     Bil  bone spurs /Left  foot neuroma removed Right foot sinus tarside surgery Right bone in toe removed   KNEE SURGERY     right   PACEMAKER INSERTION  12/15/2012   SJM 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 by Dr Waddell for [facility] 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   PERMANENT PACEMAKER INSERTION N/A 12/15/2012   SJM 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 by Dr  Waddell for transient AV block   POLYPECTOMY  03/11/2023   Procedure: POLYPECTOMY;  Surgeon: Cindie Carlin POUR, DO;  [facility];  Service: Endoscopy;;   ROBOTIC ASSISTED TOTAL HYSTERECTOMY WITH BILATERAL SALPINGO OOPHERECTOMY N/A 02/19/2017   Procedure: XI ROBOTIC ASSISTED TOTAL HYSTERECTOMY WITH BILATERAL SALPINGO OOPHORECTOMY;  Surgeon: Eloy Herring, MD;  Location: [facility];  Service: Gynecology;  Laterality: N/A;   SENTINEL NODE BIOPSY N/A 02/19/2017   Procedure: SENTINEL NODE BIOPSY;  Surgeon: Eloy Herring, MD;  Location: [facility];  Service: Gynecology;  Laterality: N/A;   SHOULDER ARTHROSCOPY WITH SUBACROMIAL DECOMPRESSION AND OPEN ROTATOR C Right 07/07/2013   Procedure: RIGHT SHOULDER ARTHROSCOPY WITH SUBACROMIAL DECOMPRESSION AND ROTATOR CUFF DEBRIDEMENT/ DISTAL CLAVICLE RESECTION;  Surgeon: Lamar LULLA Leonor Mickey., MD;  Location: [facility];  Service: Orthopedics;  Laterality: Right;   SHOULDER SURGERY Right 2012   done in [place]   TOTAL KNEE ARTHROPLASTY Right 09/02/2023   Procedure: ARTHROPLASTY, KNEE, TOTAL;  Surgeon: Melodi Lerner, MD;  Location: [facility];  Service: Orthopedics;  Laterality: Right;    Prior to Admission medications   Medication Sig Start Date End Date Taking? Authorizing Provider  apixaban  (ELIQUIS ) 5 MG TABS tablet Take 1 tablet (5 mg total) by mouth 2 (two) times daily. 10/14/23  Yes Landy Barnie RAMAN, NP  citalopram  (CELEXA ) 10 MG tablet Take 1 tablet (10 mg total) by mouth every Monday, Wednesday, and Friday. Patient taking differently: Take 10 mg by mouth every Monday, Wednesday, and Friday. At night 10/14/23  Yes Landy Barnie RAMAN, NP  HUMULIN  70/30 (70-30) 100 UNIT/ML injection Inject 20 Units into the skin 2 (two) times daily with a meal. Patient taking differently: Inject 50 Units into the skin 2 (two) times daily with a meal. 10/14/23  Yes Landy Barnie RAMAN, NP  hydrochlorothiazide  (HYDRODIURIL ) 25 MG tablet Take 1 tablet (25 mg total) by mouth in the  morning. 10/14/23  Yes Landy Barnie RAMAN, NP  levothyroxine  (SYNTHROID ) 112 MCG tablet Take 1 tablet (112 mcg total) by mouth daily before breakfast. 10/14/23  Yes Landy Barnie RAMAN, NP  loratadine  (CLARITIN ) 10 MG tablet Take 10 mg by mouth in the morning.   Yes [provider]  Misc Natural Products (IMMUNE FORMULA PO) Take 1 capsule by mouth in the morning and at bedtime. ESSIAC Tea All-Natural Herbal Extract Capsule--Immune System Support   Yes [provider]  Multiple Vitamins-Minerals (LUTEIN-ZEAXANTHIN PO) Take 1 tablet by mouth at bedtime.   Yes [provider]  omeprazole (PRILOSEC) 20 MG capsule Take 20 mg by mouth daily before lunch. 02/11/24  Yes [provider]  Polyethyl Glycol-Propyl Glycol (SYSTANE) 0.4-0.3 % SOLN Place 1-2 drops into both eyes 3 (three) times daily as needed (dry/irritated eyes.).   Yes [provider]  Probiotic Product (PROBIOTIC DAILY PO) Take 1 capsule by mouth in the morning.   Yes [provider]    Allergies  Allergen Reactions   Ace Inhibitors Cough   Cardizem  [Diltiazem  Hcl] Other (See Comments)    headache   Misc. Sulfonamide Containing Compounds Other (See Comments)   Oxycodone  Nausea Only   Statins Other (See Comments)    myalgias   Sulfa Antibiotics Hives and Swelling   Tape     Paper tape is okay   Aspirin  Rash   Cortisone Rash    flushing   Fd&C Yellow #5 (Tartrazine) Rash   Latex Rash    Sensitive not allergic    Social History   Socioeconomic History   Marital status: Widowed    Spouse name: Not on file   Number of children: 2   Years of education: Not on file   Highest education level: Not on file  Occupational History   Occupation: retired  Tobacco Use   Smoking status: Never   Smokeless tobacco: Never  Vaping Use   Vaping status: Never Used  Substance and Sexual Activity   Alcohol  use: No    Alcohol /week: 0.0 standard drinks of alcohol    Drug use: No   Sexual  activity: Not Currently    Birth control/protection: Post-menopausal  Other Topics Concern   Not on file  Social History Narrative   Retired and lives alone in [place], Widowed now      Right handed    Wear glasses    Drinks ginger ale ( sugar free)   Social Drivers of Corporate investment banker Strain: Not on file  Food Insecurity: No Food Insecurity (09/12/2023)   Hunger Vital Sign    Worried About Running Out of Food in the Last Year: Never true    Ran Out of Food in the Last Year: Never true  Transportation Needs: No Transportation Needs (09/12/2023)   PRAPARE - Administrator, Civil Service (Medical): No    Lack of Transportation (Non-Medical): No  Physical Activity: Not on file  Stress: Not on file  Social Connections: Moderately Isolated (09/12/2023)   Social Connection and Isolation Panel    Frequency of Communication with Friends and Family: More than three times a week    Frequency of Social Gatherings with Friends and Family: More than three times a week    Attends Religious Services: More than 4 times per year    Active Member of [employer] or Organizations: No    Attends Banker Meetings: Never    Marital Status: Widowed  Intimate Partner Violence: Not At Risk (09/12/2023)   Humiliation, Afraid, Rape, and Kick questionnaire    Fear of Current or Ex-Partner: No    Emotionally Abused: No    Physically Abused: No    Sexually Abused: No    Tobacco Use: Low Risk  (12/04/2023)   Received from [facility]   Patient History    Smoking Tobacco Use: Never    Smokeless Tobacco Use: Never    Passive Exposure: Not on file   Social History   Substance and Sexual Activity  Alcohol  Use No   Alcohol /week: 0.0 standard drinks of alcohol     Family History  Problem Relation Age of Onset   Sudden death Brother        age 6s, died while playing raquetball   Stroke Mother    Cancer Father        throat   Tongue cancer  Father    Heart disease Maternal  Grandmother    Cancer Maternal Aunt        type unk,hx smoking   Cancer Maternal Uncle        type unk, hx smok   Cancer Paternal Aunt 28       thinks it was GI/colon- had hole in side and colostomy bag   Cancer Maternal Aunt        type unk, had a colostomy, hx smoking   Cancer Maternal Uncle        cancer maybe-?type unk   Throat cancer Maternal Uncle        hx smoking   Heart attack Maternal Uncle 50   Cancer Cousin        type unk- died in 65's/50's   Cancer Cousin        type unk, died in 67's/50's   Breast cancer Cousin        x2   Other Other 25       brain tumor   Allergic rhinitis Neg Hx    Angioedema Neg Hx    Asthma Neg Hx    Atopy Neg Hx    Eczema Neg Hx    Immunodeficiency Neg Hx    Urticaria Neg Hx     ROS  Objective:  Physical Exam: - Well-developed female alert and oriented in no apparent distress  - Right knee demonstrates no warmth or swelling.  - Full extension achieved.  - Flexion limited to approximately 100 degrees without a firm endpoint.  - No instability or tenderness noted.  Imaging Review - X-rays of the AP and lateral views of the right knee taken on 12/11/23 show the prosthesis in good position and the periprosthetic fracture has completely healed.  Assessment/Plan:   ASSESSMENT: Estephania Licciardi presents with stiffness in the right knee following a right total knee arthroplasty and periprosthetic medial condyle fracture of the femur. Her primary limitation is reduced flexion, currently at approximately 100 degrees. Given that it has been over six months since the injury, the provider feels comfortable proceeding with a closed manipulation to improve range of motion. The risk of bony injury is considered extremely low due to adequate healing. If significant resistance is encountered during the manipulation, the provider will not push beyond that point to avoid potential injury.  PLAN: The provider recommends proceeding with a closed  manipulation of the right knee to address stiffness and improve flexion. The procedure will be performed at [place], and [place]en will return home the same day. Following the manipulation, she will resume physical therapy with Alicia [name] to optimize her range of motion and functional recovery.   Patient's anticipated LOS is less than 2 midnights, meeting these requirements: - Younger than 79 - Lives within 1 hour of care - Has a competent adult at home to recover with post-op recover - NO history of  - Chronic pain requiring opiods  - Coronary Artery Disease  - Heart failure  - Heart attack  - Stroke  - DVT/VTE  - Respiratory Failure/COPD  - Renal failure  - Anemia  - Advanced Liver disease   - Patient was instructed on what medications to stop prior to surgery. - Follow-up visit in 2 weeks with Dr. Melodi - Begin physical therapy following surgery - Pre-operative lab work as pre-surgical testing - Prescriptions will be provided in hospital at time of discharge  Corean Sender, PA-C Orthopedic Surgery [facility] [place]

## 2024-02-17 NOTE — Progress Notes (Signed)
 PERIOPERATIVE PRESCRIPTION FOR IMPLANTED CARDIAC DEVICE PROGRAMMING  Patient Information: Name:  Alicia [name]  DOB:  May 07, 1946  MRN:  [id number]    Planned Procedure:  R knee closed manipulation  Surgeon:  Dr. Melodi  Date of Procedure:  02-24-24  Cautery will be used.  Position during surgery:  Supine   Please send documentation back to:  Darryle Law (Fax # [phone])  Device Information:  Clinic EP Physician:  Dr. Eulas Furbish   Device Type:  Pacemaker Manufacturer and Phone #:  St. Jude/Abbott: [phone] Pacemaker Dependent?:  No. Date of Last Device Check:  11/18/2023  Normal Device Function?:  Yes.    Electrophysiologist's Recommendations:  Have magnet available. Provide continuous ECG monitoring when magnet is used or reprogramming is to be performed.  Procedure should not interfere with device function.  No device programming or magnet placement needed.  Per Device Clinic Standing Orders, Almarie ONEIDA Shutter, RN  9:58 AM 02/17/2024

## 2024-02-17 NOTE — Progress Notes (Signed)
 Surgery orders requested via Epic inbox.

## 2024-02-17 NOTE — Patient Instructions (Addendum)
 SURGICAL WAITING ROOM VISITATION Patients having surgery or a procedure may have no more than 2 support people in the waiting area - these visitors may rotate.    Children under the age of 35 must have an adult with them who is not the patient.  If the patient needs to stay at the hospital during part of their recovery, the visitor guidelines for inpatient rooms apply. Pre-op nurse will coordinate an appropriate time for 1 support person to accompany patient in pre-op.  This support person may not rotate.    Please refer to the [facility] website for the visitor guidelines for Inpatients (after your surgery is over and you are in a regular room).       Your procedure is scheduled on: 02-24-24   Report to [facility] Main Entrance    Report to admitting at 1:15 PM   Call this number if you have problems the morning of surgery [phone]   Do not eat food :After Midnight.   After Midnight you may have the following liquids until 12:30 PM DAY OF SURGERY  Water  Non-Citrus Juices (without pulp, NO RED-Apple, White grape, White cranberry) Black Coffee (NO MILK/CREAM OR CREAMERS, sugar ok)  Clear Tea (NO MILK/CREAM OR CREAMERS, sugar ok) regular and decaf                             Plain Jell-O (NO RED)                                           Fruit ices (not with fruit pulp, NO RED)                                     Popsicles (NO RED)                                                               Sports drinks like Gatorade (NO RED)                   The day of surgery:  Drink ONE (1) Pre-Surgery Clear Ensure or G2 by AM the morning of surgery. Drink in one sitting. Do not sip.  This drink was given to you during your hospital  pre-op appointment visit. Nothing else to drink after completing the Pre-Surgery Clear Ensure or G2.          If you have questions, please contact your surgeon's office.   FOLLOW ANY ADDITIONAL PRE OP INSTRUCTIONS YOU RECEIVED FROM YOUR  SURGEON'S OFFICE!!!     Oral Hygiene is also important to reduce your risk of infection.                                    Remember - BRUSH YOUR TEETH THE MORNING OF SURGERY WITH YOUR REGULAR TOOTHPASTE   Do NOT smoke after Midnight   Take these medicines the morning of surgery with A SIP OF WATER :    Amlodipine   Citalopram    Hydralazine    Levothyroxine    Claritin    Tylenol  if needed  Stop all vitamins and herbal supplements 7 days before surgery  How to Manage Your Diabetes Before and After Surgery  Why is it important to control my blood sugar before and after surgery? Improving blood sugar levels before and after surgery helps healing and can limit problems. A way of improving blood sugar control is eating a healthy diet by:  Eating less sugar and carbohydrates  Increasing activity/exercise  Talking with your doctor about reaching your blood sugar goals High blood sugars (greater than 180 mg/dL) can raise your risk of infections and slow your recovery, so you will need to focus on controlling your diabetes during the weeks before surgery. Make sure that the doctor who takes care of your diabetes knows about your planned surgery including the date and location.  How do I manage my blood sugar before surgery? Check your blood sugar at least 4 times a day, starting 2 days before surgery, to make sure that the level is not too high or low. Check your blood sugar the morning of your surgery when you wake up and every 2 hours until you get to the Short Stay unit. If your blood sugar is less than 70 mg/dL, you will need to treat for low blood sugar: Do not take insulin . Treat a low blood sugar (less than 70 mg/dL) with  cup of clear juice (cranberry or apple), 4 glucose tablets, OR glucose gel. Recheck blood sugar in 15 minutes after treatment (to make sure it is greater than 70 mg/dL). If your blood sugar is not greater than 70 mg/dL on recheck, call [phone] for further  instructions. Report your blood sugar to the short stay nurse when you get to Short Stay.  If you are admitted to the hospital after surgery: Your blood sugar will be checked by the staff and you will probably be given insulin  after surgery (instead of oral diabetes medicines) to make sure you have good blood sugar levels. The goal for blood sugar control after surgery is 80-180 mg/dL.   WHAT DO I DO ABOUT MY DIABETES MEDICATION?  Do not take oral diabetes medicines (pills) the morning of surgery.  THE NIGHT BEFORE SURGERY, take 70% of Humlin insulin .       THE MORNING OF SURGERY, do not take Humulin  insulin .  DO NOT TAKE THE FOLLOWING 7 DAYS PRIOR TO SURGERY: Ozempic, Wegovy, Rybelsus (Semaglutide), Byetta (exenatide), Bydureon (exenatide ER), Victoza, Saxenda (liraglutide), or Trulicity (dulaglutide) Mounjaro (Tirzepatide) Adlyxin (Lixisenatide), Polyethylene Glycol Loxenatide.   Reviewed and Endorsed by [facility] Patient Education Committee, August 2015  Bring CPAP mask and tubing day of surgery.                              You may not have any metal on your body including hair pins, jewelry, and body piercing             Do not wear make-up, lotions, powders, perfumes or deodorant  Do not wear nail polish including gel and S&S, artificial/acrylic nails, or any other type of covering on natural nails including finger and toenails. If you have artificial nails, gel coating, etc. that needs to be removed by a nail salon please have this removed prior to surgery or surgery may need to be canceled/ delayed if the surgeon/ anesthesia feels like they are unable to be safely monitored.  Do not shave  48 hours prior to surgery.    Do not bring valuables to the hospital. [facility] IS NOT RESPONSIBLE   FOR VALUABLES.   Contacts, dentures or bridgework may not be worn into surgery.  DO NOT BRING YOUR HOME MEDICATIONS TO THE HOSPITAL. PHARMACY WILL DISPENSE MEDICATIONS LISTED ON  YOUR MEDICATION LIST TO YOU DURING YOUR ADMISSION IN THE HOSPITAL!    Patients discharged on the day of surgery will not be allowed to drive home.  Someone NEEDS to stay with you for the first 24 hours after anesthesia.   Special Instructions: Bring a copy of your healthcare power of attorney and living will documents the day of surgery if you haven't scanned them before.              Please read over the following fact sheets you were given: IF YOU HAVE QUESTIONS ABOUT YOUR PRE-OP INSTRUCTIONS PLEASE CALL [phone] [place]  If you received a COVID test during your pre-op visit  it is requested that you wear a mask when out in public, stay away from anyone that may not be feeling well and notify your surgeon if you develop symptoms. If you test positive for Covid or have been in contact with anyone that has tested positive in the last 10 days please notify you surgeon.  [facility] - Preparing for Surgery Before surgery, you can play an important role.  Because skin is not sterile, your skin needs to be as free of germs as possible.  You can reduce the number of germs on your skin by washing with CHG (chlorahexidine gluconate) soap before surgery.  CHG is an antiseptic cleaner which kills germs and bonds with the skin to continue killing germs even after washing. Please DO NOT use if you have an allergy to CHG or antibacterial soaps.  If your skin becomes reddened/irritated stop using the CHG and inform your nurse when you arrive at Short Stay. Do not shave (including legs and underarms) for at least 48 hours prior to the first CHG shower.  You may shave your face/neck.  Please follow these instructions carefully:  1.  Shower with CHG Soap the night before surgery and the  morning of surgery.  2.  If you choose to wash your hair, wash your hair first as usual with your normal  shampoo.  3.  After you shampoo, rinse your hair and body thoroughly to remove the shampoo.                             4.   Use CHG as you would any other liquid soap.  You can apply chg directly to the skin and wash.  Gently with a scrungie or clean washcloth.  5.  Apply the CHG Soap to your body ONLY FROM THE NECK DOWN.   Do   not use on face/ open                           Wound or open sores. Avoid contact with eyes, ears mouth and   genitals (private parts).                       Wash face,  Genitals (private parts) with your normal soap.             6.  Wash thoroughly, paying special attention to the area  where your    surgery  will be performed.  7.  Thoroughly rinse your body with warm water  from the neck down.  8.  DO NOT shower/wash with your normal soap after using and rinsing off the CHG Soap.                9.  Pat yourself dry with a clean towel.            10.  Wear clean pajamas.            11.  Place clean sheets on your bed the night of your first shower and do not  sleep with pets. Day of Surgery : Do not apply any lotions/deodorants the morning of surgery.  Please wear clean clothes to the hospital/surgery center.  FAILURE TO FOLLOW THESE INSTRUCTIONS MAY RESULT IN THE CANCELLATION OF YOUR SURGERY  PATIENT SIGNATURE_________________________________  NURSE SIGNATURE__________________________________  ________________________________________________________________________

## 2024-02-17 NOTE — Progress Notes (Signed)
 Date of COVID positive in last 90 days:  PCP - Rocky Burow, FNP Cardiologist - Eulas Furbish, MD  Chest x-ray - 04-09-23 1V Epic EKG - 09-13-23 Epic Stress Test - 06-18-14 Epic ECHO - 12-14-12 Epic Cardiac Cath -  Pacemaker/ICD device last checked:11-18-23 Epic Spinal Cord Stimulator:N/A  Bowel Prep - N/A  Sleep Study - Yes CPAP - No  Fasting Blood Sugar -  Checks Blood Sugar _____ times a day  Last dose of GLP1 agonist-  N/A GLP1 instructions:  Do not take after     Last dose of SGLT-2 inhibitors-  N/A SGLT-2 instructions:  Do not take after     Blood Thinner Instructions: Eliquis   Last dose:   Time: Aspirin  Instructions:N/A Last Dose:  Activity level:  Can go up a flight of stairs and perform activities of daily living without stopping and without symptoms of chest pain or shortness of breath.  Able to exercise without symptoms  Unable to go up a flight of stairs without symptoms of     Anesthesia review: Afib, Pacemaker, DM, CKD, OSA, HTN  Patient denies shortness of breath, fever, cough and chest pain at PAT appointment  Patient verbalized understanding of instructions that were given to them at the PAT appointment. Patient was also instructed that they will need to review over the PAT instructions again at home before surgery.

## 2024-02-18 ENCOUNTER — Encounter (HOSPITAL_COMMUNITY)
Admission: RE | Admit: 2024-02-18 | Discharge: 2024-02-18 | Disposition: A | Discharge status: Home / Self Care | Source: Ambulatory Visit | Attending: Orthopedic Surgery | Admitting: Orthopedic Surgery

## 2024-02-18 ENCOUNTER — Telehealth: Payer: Self-pay

## 2024-02-18 ENCOUNTER — Other Ambulatory Visit: Payer: Self-pay

## 2024-02-18 ENCOUNTER — Encounter (HOSPITAL_COMMUNITY): Payer: Self-pay

## 2024-02-18 VITALS — Ht 66.0 in | Wt 218.0 lb

## 2024-02-18 DIAGNOSIS — J309 Allergic rhinitis, unspecified: Secondary | ICD-10-CM | POA: Diagnosis not present

## 2024-02-18 DIAGNOSIS — M79672 Pain in left foot: Secondary | ICD-10-CM | POA: Diagnosis not present

## 2024-02-18 DIAGNOSIS — E1122 Type 2 diabetes mellitus with diabetic chronic kidney disease: Secondary | ICD-10-CM | POA: Diagnosis not present

## 2024-02-18 DIAGNOSIS — D649 Anemia, unspecified: Secondary | ICD-10-CM

## 2024-02-18 DIAGNOSIS — E039 Hypothyroidism, unspecified: Secondary | ICD-10-CM | POA: Diagnosis not present

## 2024-02-18 DIAGNOSIS — E1142 Type 2 diabetes mellitus with diabetic polyneuropathy: Secondary | ICD-10-CM | POA: Diagnosis not present

## 2024-02-18 DIAGNOSIS — I2510 Atherosclerotic heart disease of native coronary artery without angina pectoris: Secondary | ICD-10-CM

## 2024-02-18 DIAGNOSIS — E1140 Type 2 diabetes mellitus with diabetic neuropathy, unspecified: Secondary | ICD-10-CM | POA: Diagnosis not present

## 2024-02-18 DIAGNOSIS — N1831 Chronic kidney disease, stage 3a: Secondary | ICD-10-CM | POA: Diagnosis not present

## 2024-02-18 DIAGNOSIS — I10 Essential (primary) hypertension: Secondary | ICD-10-CM | POA: Diagnosis not present

## 2024-02-18 DIAGNOSIS — K529 Noninfective gastroenteritis and colitis, unspecified: Secondary | ICD-10-CM | POA: Diagnosis not present

## 2024-02-18 DIAGNOSIS — M79674 Pain in right toe(s): Secondary | ICD-10-CM | POA: Diagnosis not present

## 2024-02-18 DIAGNOSIS — M79671 Pain in right foot: Secondary | ICD-10-CM | POA: Diagnosis not present

## 2024-02-18 DIAGNOSIS — I482 Chronic atrial fibrillation, unspecified: Secondary | ICD-10-CM | POA: Diagnosis not present

## 2024-02-18 DIAGNOSIS — E1151 Type 2 diabetes mellitus with diabetic peripheral angiopathy without gangrene: Secondary | ICD-10-CM | POA: Diagnosis not present

## 2024-02-18 DIAGNOSIS — Z794 Long term (current) use of insulin: Secondary | ICD-10-CM | POA: Diagnosis not present

## 2024-02-18 DIAGNOSIS — M79675 Pain in left toe(s): Secondary | ICD-10-CM | POA: Diagnosis not present

## 2024-02-18 DIAGNOSIS — E782 Mixed hyperlipidemia: Secondary | ICD-10-CM | POA: Diagnosis not present

## 2024-02-18 DIAGNOSIS — E119 Type 2 diabetes mellitus without complications: Secondary | ICD-10-CM

## 2024-02-18 DIAGNOSIS — M1711 Unilateral primary osteoarthritis, right knee: Secondary | ICD-10-CM | POA: Diagnosis not present

## 2024-02-18 DIAGNOSIS — L600 Ingrowing nail: Secondary | ICD-10-CM | POA: Diagnosis not present

## 2024-02-18 HISTORY — DX: Gastro-esophageal reflux disease without esophagitis: K21.9

## 2024-02-18 LAB — CUP PACEART REMOTE DEVICE CHECK
Battery Remaining Longevity: 12 mo
Battery Remaining Percentage: 9 %
Battery Voltage: 2.81 V
Brady Statistic AP VP Percent: 1 %
Brady Statistic AP VS Percent: 45 %
Brady Statistic AS VP Percent: 1 %
Brady Statistic AS VS Percent: 54 %
Brady Statistic RA Percent Paced: 43 %
Brady Statistic RV Percent Paced: 1 %
Date Time Interrogation Session: 20251013162854
Implantable Lead Connection Status: 753985
Implantable Lead Connection Status: 753985
Implantable Lead Implant Date: 20140811
Implantable Lead Implant Date: 20140811
Implantable Lead Location: 753859
Implantable Lead Location: 753860
Implantable Pulse Generator Implant Date: 20140811
Lead Channel Impedance Value: 350 Ohm
Lead Channel Impedance Value: 410 Ohm
Lead Channel Pacing Threshold Amplitude: 0.75 V
Lead Channel Pacing Threshold Amplitude: 0.75 V
Lead Channel Pacing Threshold Pulse Width: 0.4 ms
Lead Channel Pacing Threshold Pulse Width: 0.4 ms
Lead Channel Sensing Intrinsic Amplitude: 12 mV
Lead Channel Sensing Intrinsic Amplitude: 2.8 mV
Lead Channel Setting Pacing Amplitude: 1 V
Lead Channel Setting Pacing Amplitude: 1.75 V
Lead Channel Setting Pacing Pulse Width: 0.4 ms
Lead Channel Setting Sensing Sensitivity: 2 mV
Pulse Gen Model: 2240
Pulse Gen Serial Number: 7528878

## 2024-02-18 NOTE — Telephone Encounter (Signed)
 Patient has been scheduled for televisit med rec and consent     Patient Consent for Virtual Visit         Alicia YARBOUGH has provided verbal consent on 02/18/2024 for a virtual visit (video or telephone).   CONSENT FOR VIRTUAL VISIT FOR:  Alicia [name]  By participating in this virtual visit I agree to the following:  I hereby voluntarily request, consent and authorize [facility] and its employed or contracted physicians, physician assistants, nurse practitioners or other licensed health care professionals (the Practitioner), to provide me with telemedicine health care services (the "Services) as deemed necessary by the treating Practitioner. I acknowledge and consent to receive the Services by the Practitioner via telemedicine. I understand that the telemedicine visit will involve communicating with the Practitioner through live audiovisual communication technology and the disclosure of certain medical information by electronic transmission. I acknowledge that I have been given the opportunity to request an in-person assessment or other available alternative prior to the telemedicine visit and am voluntarily participating in the telemedicine visit.  I understand that I have the right to withhold or withdraw my consent to the use of telemedicine in the course of my care at any time, without affecting my right to future care or treatment, and that the Practitioner or I may terminate the telemedicine visit at any time. I understand that I have the right to inspect all information obtained and/or recorded in the course of the telemedicine visit and may receive copies of available information for a reasonable fee.  I understand that some of the potential risks of receiving the Services via telemedicine include:  Delay or interruption in medical evaluation due to technological equipment failure or disruption; Information transmitted may not be sufficient (e.g. poor resolution of images) to  allow for appropriate medical decision making by the Practitioner; and/or  In rare instances, security protocols could fail, causing a breach of personal health information.  Furthermore, I acknowledge that it is my responsibility to provide information about my medical history, conditions and care that is complete and accurate to the best of my ability. I acknowledge that Practitioner's advice, recommendations, and/or decision may be based on factors not within their control, such as incomplete or inaccurate data provided by me or distortions of diagnostic images or specimens that may result from electronic transmissions. I understand that the practice of medicine is not an exact science and that Practitioner makes no warranties or guarantees regarding treatment outcomes. I acknowledge that a copy of this consent can be made available to me via my patient portal [facility] MyChart), or I can request a printed copy by calling the office of [facility].    I understand that my insurance will be billed for this visit.   I have read or had this consent read to me. I understand the contents of this consent, which adequately explains the benefits and risks of the Services being provided via telemedicine.  I have been provided ample opportunity to ask questions regarding this consent and the Services and have had my questions answered to my satisfaction. I give my informed consent for the services to be provided through the use of telemedicine in my medical care

## 2024-02-18 NOTE — Telephone Encounter (Signed)
   Name: Alicia [name]  DOB: 1946/05/04  MRN: [id number]  Primary Cardiologist: None  Chart reviewed as part of pre-operative protocol coverage. Because of [name]'s past medical history and time since last visit, she will require a follow-up telephone visit in order to better assess preoperative cardiovascular risk.  Pre-op covering staff: - Please schedule appointment and call patient to inform them. If patient already had an upcoming appointment within acceptable timeframe, please add pre-op clearance to the appointment notes so provider is aware. - Please contact requesting surgeon's office via preferred method (i.e, phone, fax) to inform them of need for appointment prior to surgery.  Per office protocol, patient can hold Eliquis  for 3 days prior to procedure. Please resume when medically safe to do so.   Patient will not need bridging with Lovenox  (enoxaparin ) around procedure.  Orren LOISE Fabry, PA-C  02/18/2024, 11:58 AM

## 2024-02-18 NOTE — Telephone Encounter (Signed)
 Patient with diagnosis of afib on Eliquis  for anticoagulation.    Procedure:  RIGHT KNEE CLOSED MANIPULATION   Date of Surgery:  Clearance 02/24/24                                CHA2DS2-VASc Score = 6   This indicates a 9.7% annual risk of stroke. The patient's score is based upon: CHF History: 0 HTN History: 1 Diabetes History: 1 Stroke History: 0 Vascular Disease History: 1 Age Score: 2 Gender Score: 1   CrCl 47 ml/min Platelet count 213 K  Patient has not had an Afib/aflutter ablation within the last 3 months or DCCV within the last 30 days  Per office protocol, patient can hold Eliquis  for 3 days prior to procedure.    Patient will not need bridging with Lovenox  (enoxaparin ) around procedure.  **This guidance is not considered finalized until pre-operative APP has relayed final recommendations.**

## 2024-02-18 NOTE — Telephone Encounter (Signed)
Patient has been scheduled for televisit.

## 2024-02-19 ENCOUNTER — Ambulatory Visit: Attending: Cardiovascular Disease | Admitting: Emergency Medicine

## 2024-02-19 DIAGNOSIS — Z01810 Encounter for preprocedural cardiovascular examination: Secondary | ICD-10-CM

## 2024-02-19 NOTE — Progress Notes (Signed)
 Remote PPM Transmission

## 2024-02-19 NOTE — Progress Notes (Signed)
 Virtual Visit via Telephone Note   Because of SANDARA TYREE co-morbid illnesses, she is at least at moderate risk for complications without adequate follow up.  This format is felt to be most appropriate for this patient at this time.  Due to technical limitations with video connection Web designer), today's appointment will be conducted as an audio only telehealth visit, and PAULETTE ROCKFORD verbally agreed to proceed in this manner.   All issues noted in this document were discussed and addressed.  No physical exam could be performed with this format.  Evaluation Performed:  Preoperative cardiovascular risk assessment _____________   Date:  02/19/2024   Patient ID:  Kismet, Facemire 1945-09-06, MRN [id number] Patient Location:  Home Provider location:   Office  Primary Care Provider:  Toribio Jerel MATSU, MD Primary Cardiologist:  None  Chief Complaint / Patient Profile   78 y.o. y/o female with a h/o symptomatic second-degree heart block s/p pacemaker, paroxysmal atrial fibrillation, hypertension, obesity, obstructive sleep apnea who is pending right knee close manipulation on 02/24/2024 with [facility] and presents today for telephonic preoperative cardiovascular risk assessment.  History of Present Illness    ANNALISSE MINKOFF is a 78 y.o. female who presents via audio/video conferencing for a telehealth visit today.  Pt was last seen in cardiology clinic on 05/17/2023 by Dr. Nancey.  At that time TATJANA TURCOTT was doing well.  The patient is now pending procedure as outlined above. Since her last visit, she denies chest pain, shortness of breath, lower extremity edema, fatigue, palpitations, melena, hematuria, hemoptysis, diaphoresis, weakness, presyncope, syncope, orthopnea, and PND.  Today patient is doing well without acute cardiovascular concerns.  She stays relatively active at home without exertional symptoms.  She does use a cane or rollator when she is outside of her home.  She denies any  chest pains, dyspnea, syncope.  Overall able to complete greater than 4 METS.  No symptoms to suggest active angina.  Past Medical History    Past Medical History:  Diagnosis Date   Anemia    hx of   Anxiety    Cancer (HCC)    endometrial cancer/ skin cancers removed nose non melanoma   Diabetes (HCC)    Type 2   DJD (degenerative joint disease)    Dysrhythmia    SYMPTOMATIC BRADYCARDIA  / A. 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   Wears glasses    Past Surgical History:  Procedure Laterality Date   ANKLE SURGERY Right    multiple   BIOPSY  03/11/2023   Procedure: BIOPSY;  Surgeon: Cindie Carlin POUR, DO;  [facility];  Service: Endoscopy;;   BREAST SURGERY     biopsy L breast benign   CHOLECYSTECTOMY     Laparoscopic but converted to open  d/t large stones   COLONOSCOPY     COLONOSCOPY WITH PROPOFOL  N/A 03/11/2023   Procedure: COLONOSCOPY WITH PROPOFOL ;  Surgeon: Cindie Carlin POUR, DO;  [facility];  Service: Endoscopy;  Laterality: N/A;  915am, asa 3   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    EYE SURGERY     both cataracts   FOOT ARTHROTOMY     left   FOOT SURGERY     Bil  bone spurs /Left  foot neuroma removed Right foot sinus tarside surgery Right bone in toe removed   KNEE SURGERY     right   PACEMAKER INSERTION  12/15/2012   SJM 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 by Dr Waddell for 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   PERMANENT PACEMAKER INSERTION N/A 12/15/2012   SJM 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 by Dr Waddell for transient AV block   POLYPECTOMY  03/11/2023    Procedure: POLYPECTOMY;  Surgeon: Cindie Carlin POUR, DO;  [facility];  Service: Endoscopy;;   ROBOTIC ASSISTED TOTAL HYSTERECTOMY WITH BILATERAL SALPINGO OOPHERECTOMY N/A 02/19/2017   Procedure: XI ROBOTIC ASSISTED TOTAL HYSTERECTOMY WITH BILATERAL SALPINGO OOPHORECTOMY;  Surgeon: Eloy Herring, MD;  Location: [facility];  Service: Gynecology;  Laterality: N/A;   SENTINEL NODE BIOPSY N/A 02/19/2017   Procedure: SENTINEL NODE BIOPSY;  Surgeon: Eloy Herring, MD;  Location: [facility];  Service: Gynecology;  Laterality: N/A;   SHOULDER ARTHROSCOPY WITH SUBACROMIAL DECOMPRESSION AND OPEN ROTATOR C Right 07/07/2013   Procedure: RIGHT SHOULDER ARTHROSCOPY WITH SUBACROMIAL DECOMPRESSION AND ROTATOR CUFF DEBRIDEMENT/ DISTAL CLAVICLE RESECTION;  Surgeon: Lamar LULLA Leonor Mickey., MD;  Location: [facility];  Service: Orthopedics;  Laterality: Right;   SHOULDER SURGERY Right 2012   done in [place]   TOTAL KNEE ARTHROPLASTY Right 09/02/2023   Procedure: ARTHROPLASTY, KNEE, TOTAL;  Surgeon: Melodi Lerner, MD;  Location: [facility];  Service: Orthopedics;  Laterality: Right;    Allergies  Allergies  Allergen Reactions   Ace Inhibitors Cough   Cardizem  [Diltiazem  Hcl] Other (See Comments)    headache   Misc. Sulfonamide Containing Compounds Other (See Comments)   Oxycodone  Nausea Only   Statins Other (See Comments)    myalgias   Sulfa Antibiotics Hives and Swelling   Tape     Paper tape is okay   Aspirin  Rash   Cortisone Rash    flushing   Fd&C Yellow #5 (Tartrazine) Rash   Latex Rash    Sensitive not allergic    Home Medications    Prior to Admission medications   Medication Sig Start Date End Date Taking? Authorizing Provider  apixaban  (ELIQUIS ) 5 MG TABS tablet Take 1 tablet (5 mg total) by mouth 2 (two) times daily. 10/14/23   Landy Barnie RAMAN, NP  citalopram  (CELEXA ) 10 MG tablet Take 1 tablet (10 mg total) by mouth every Monday, Wednesday, and Friday. Patient taking differently:  Take 10 mg by mouth every Monday, Wednesday, and Friday. At night 10/14/23   Landy Barnie RAMAN, NP  HUMULIN  70/30 (70-30) 100 UNIT/ML injection Inject 20 Units into the skin 2 (two) times daily with a meal. Patient taking differently: Inject 50 Units into the skin 2 (two) times daily with a meal. 10/14/23   Landy, Barnie RAMAN, NP  hydrochlorothiazide  (HYDRODIURIL ) 25 MG tablet Take 1 tablet (25 mg total) by mouth in the morning. 10/14/23   Landy Barnie RAMAN, NP  levothyroxine  (SYNTHROID ) 112 MCG tablet Take 1 tablet (112 mcg total) by mouth daily before breakfast. 10/14/23   Landy Barnie RAMAN, NP  loratadine  (CLARITIN ) 10 MG tablet Take 10 mg by mouth in the morning.    [provider]  Misc Natural Products (IMMUNE FORMULA PO) Take 1 capsule by mouth in the morning and at bedtime. ESSIAC Tea All-Natural Herbal Extract Capsule--Immune System Support    [provider]  Multiple Vitamins-Minerals (LUTEIN-ZEAXANTHIN PO) Take 1 tablet by mouth at bedtime.    [provider]  omeprazole (PRILOSEC) 20 MG capsule Take 20 mg by mouth daily before lunch. 02/11/24   [provider]  Polyethyl Glycol-Propyl Glycol (SYSTANE) 0.4-0.3 % SOLN Place 1-2 drops into both eyes 3 (three) times daily as needed (dry/irritated eyes.).    [provider]  Probiotic Product (PROBIOTIC DAILY PO) Take 1 capsule by mouth in the morning.    [provider]    Physical Exam    Vital Signs:  Ronal CHRISTELLA Limes does not have vital signs available for review today.  Given telephonic nature of communication, physical exam is limited. AAOx3. NAD. Normal affect.  Speech and respirations are unlabored.  Accessory Clinical Findings    None  Assessment & Plan    1.  Preoperative Cardiovascular Risk Assessment: According to the Revised Cardiac Risk Index (RCRI), her Perioperative Risk of Major Cardiac Event is (%): 0.4. Her Functional Capacity in METs is: 5.07 according to the [facility] Activity  Status Index (DASI).  Therefore, based on ACC/AHA guidelines, patient would be at acceptable risk for the planned procedure without further cardiovascular testing. I will route this recommendation to the requesting party via Epic fax function.  The patient was advised that if she develops new symptoms prior to surgery to contact our office to arrange for a follow-up visit, and she verbalized understanding.  Per office protocol, patient can hold Eliquis  for 3 days prior to procedure.     Patient will not need bridging with Lovenox  (enoxaparin ) around procedure.  A copy of this note will be routed to requesting surgeon.  Time:   Today, I have spent 10 minutes with the patient with telehealth technology discussing medical history, symptoms, and management plan.     Lum LITTIE Louis, NP  02/19/2024, 3:53 PM

## 2024-02-24 ENCOUNTER — Ambulatory Visit (HOSPITAL_BASED_OUTPATIENT_CLINIC_OR_DEPARTMENT_OTHER)

## 2024-02-24 ENCOUNTER — Encounter (HOSPITAL_COMMUNITY): Payer: Self-pay | Admitting: Orthopedic Surgery

## 2024-02-24 ENCOUNTER — Encounter (HOSPITAL_COMMUNITY)
Admission: RE | Disposition: A | Discharge status: Home / Self Care | Payer: Self-pay | Source: Home / Self Care | Attending: Orthopedic Surgery

## 2024-02-24 ENCOUNTER — Ambulatory Visit (HOSPITAL_COMMUNITY): Payer: Self-pay | Admitting: Physician Assistant

## 2024-02-24 ENCOUNTER — Ambulatory Visit (HOSPITAL_COMMUNITY)
Admission: RE | Admit: 2024-02-24 | Discharge: 2024-02-24 | Disposition: A | Discharge status: Home / Self Care | Attending: Orthopedic Surgery | Admitting: Orthopedic Surgery

## 2024-02-24 ENCOUNTER — Other Ambulatory Visit: Payer: Self-pay

## 2024-02-24 DIAGNOSIS — I4891 Unspecified atrial fibrillation: Secondary | ICD-10-CM | POA: Diagnosis not present

## 2024-02-24 DIAGNOSIS — Z79899 Other long term (current) drug therapy: Secondary | ICD-10-CM | POA: Diagnosis not present

## 2024-02-24 DIAGNOSIS — N183 Chronic kidney disease, stage 3 unspecified: Secondary | ICD-10-CM | POA: Diagnosis not present

## 2024-02-24 DIAGNOSIS — E1122 Type 2 diabetes mellitus with diabetic chronic kidney disease: Secondary | ICD-10-CM | POA: Diagnosis not present

## 2024-02-24 DIAGNOSIS — M24661 Ankylosis, right knee: Secondary | ICD-10-CM | POA: Diagnosis not present

## 2024-02-24 DIAGNOSIS — G4730 Sleep apnea, unspecified: Secondary | ICD-10-CM | POA: Insufficient documentation

## 2024-02-24 DIAGNOSIS — Z950 Presence of cardiac pacemaker: Secondary | ICD-10-CM | POA: Insufficient documentation

## 2024-02-24 DIAGNOSIS — Z96651 Presence of right artificial knee joint: Secondary | ICD-10-CM | POA: Diagnosis not present

## 2024-02-24 DIAGNOSIS — Z79890 Hormone replacement therapy (postmenopausal): Secondary | ICD-10-CM | POA: Diagnosis not present

## 2024-02-24 DIAGNOSIS — D649 Anemia, unspecified: Secondary | ICD-10-CM | POA: Diagnosis not present

## 2024-02-24 DIAGNOSIS — I129 Hypertensive chronic kidney disease with stage 1 through stage 4 chronic kidney disease, or unspecified chronic kidney disease: Secondary | ICD-10-CM | POA: Diagnosis not present

## 2024-02-24 DIAGNOSIS — Z794 Long term (current) use of insulin: Secondary | ICD-10-CM | POA: Insufficient documentation

## 2024-02-24 DIAGNOSIS — K219 Gastro-esophageal reflux disease without esophagitis: Secondary | ICD-10-CM | POA: Diagnosis not present

## 2024-02-24 DIAGNOSIS — T8482XA Fibrosis due to internal orthopedic prosthetic devices, implants and grafts, initial encounter: Secondary | ICD-10-CM | POA: Diagnosis present

## 2024-02-24 DIAGNOSIS — I2510 Atherosclerotic heart disease of native coronary artery without angina pectoris: Secondary | ICD-10-CM

## 2024-02-24 DIAGNOSIS — E039 Hypothyroidism, unspecified: Secondary | ICD-10-CM | POA: Diagnosis not present

## 2024-02-24 HISTORY — PX: KNEE CLOSED REDUCTION: SHX995

## 2024-02-24 LAB — BASIC METABOLIC PANEL WITH GFR
Anion gap: 7 (ref 5–15)
BUN: 18 mg/dL (ref 8–23)
CO2: 30 mmol/L (ref 22–32)
Calcium: 9.5 mg/dL (ref 8.9–10.3)
Chloride: 103 mmol/L (ref 98–111)
Creatinine, Ser: 1.15 mg/dL — ABNORMAL HIGH (ref 0.44–1.00)
GFR, Estimated: 48 mL/min — ABNORMAL LOW (ref >60)
Glucose, Bld: 138 mg/dL — ABNORMAL HIGH (ref 70–99)
Potassium: 4.2 mmol/L (ref 3.5–5.1)
Sodium: 139 mmol/L (ref 135–145)

## 2024-02-24 LAB — CBC
HCT: 32.6 % — ABNORMAL LOW (ref 36.0–46.0)
Hemoglobin: 10.5 g/dL — ABNORMAL LOW (ref 12.0–15.0)
MCH: 33 pg (ref 26.0–34.0)
MCHC: 32.2 g/dL (ref 30.0–36.0)
MCV: 102.5 fL — ABNORMAL HIGH (ref 80.0–100.0)
Platelets: 312 K/uL (ref 150–400)
RBC: 3.18 MIL/uL — ABNORMAL LOW (ref 3.87–5.11)
RDW: 15 % (ref 11.5–15.5)
WBC: 6 K/uL (ref 4.0–10.5)
nRBC: 0.3 % — ABNORMAL HIGH (ref 0.0–0.2)

## 2024-02-24 LAB — HEMOGLOBIN A1C
Hgb A1c MFr Bld: 6.5 % — ABNORMAL HIGH (ref 4.8–5.6)
Mean Plasma Glucose: 139.85 mg/dL

## 2024-02-24 LAB — GLUCOSE, CAPILLARY
Glucose-Capillary: 126 mg/dL — ABNORMAL HIGH (ref 70–99)
Glucose-Capillary: 114 mg/dL — ABNORMAL HIGH (ref 70–99)

## 2024-02-24 SURGERY — MANIPULATION, KNEE, CLOSED
Anesthesia: General | Site: Knee | Laterality: Right

## 2024-02-24 MED ORDER — CHLORHEXIDINE GLUCONATE 0.12 % MT SOLN
15.0000 mL | Freq: Once | OROMUCOSAL | Status: AC
  Administered 2024-02-24: 15 mL via OROMUCOSAL

## 2024-02-24 MED ORDER — LIDOCAINE HCL (CARDIAC) PF 100 MG/5ML IV SOSY
PREFILLED_SYRINGE | INTRAVENOUS | Status: DC | PRN
Start: 2024-02-24 — End: 2024-02-24
  Administered 2024-02-24: 60 mg via INTRAVENOUS

## 2024-02-24 MED ORDER — LIDOCAINE HCL (PF) 2 % IJ SOLN
INTRAMUSCULAR | Status: AC
  Filled 2024-02-24: qty 5

## 2024-02-24 MED ORDER — ACETAMINOPHEN 10 MG/ML IV SOLN
1000.0000 mg | Freq: Four times a day (QID) | INTRAVENOUS | Status: DC
  Administered 2024-02-24: 1000 mg via INTRAVENOUS
  Filled 2024-02-24: qty 100

## 2024-02-24 MED ORDER — FENTANYL CITRATE (PF) 50 MCG/ML IJ SOSY: 25.0000 ug | PREFILLED_SYRINGE | INTRAMUSCULAR | Status: DC | PRN

## 2024-02-24 MED ORDER — DEXAMETHASONE SOD PHOSPHATE PF 10 MG/ML IJ SOLN
8.0000 mg | Freq: Once | INTRAMUSCULAR | Status: AC
  Administered 2024-02-24: 8 mg via INTRAVENOUS

## 2024-02-24 MED ORDER — ORAL CARE MOUTH RINSE: 15.0000 mL | Freq: Once | OROMUCOSAL | Status: AC

## 2024-02-24 MED ORDER — FENTANYL CITRATE (PF) 100 MCG/2ML IJ SOLN
INTRAMUSCULAR | Status: DC | PRN
  Administered 2024-02-24 (×2): 25 ug via INTRAVENOUS

## 2024-02-24 MED ORDER — LACTATED RINGERS IV SOLN: INTRAVENOUS | Status: DC

## 2024-02-24 MED ORDER — FENTANYL CITRATE (PF) 100 MCG/2ML IJ SOLN
INTRAMUSCULAR | Status: AC
  Filled 2024-02-24: qty 2

## 2024-02-24 MED ORDER — PROPOFOL 500 MG/50ML IV EMUL
INTRAVENOUS | Status: DC | PRN
  Administered 2024-02-24: 40 mg via INTRAVENOUS
  Administered 2024-02-24: 50 mg via INTRAVENOUS

## 2024-02-24 SURGICAL SUPPLY — 6 items
BNDG ADH 1X3 SHEER STRL LF (GAUZE/BANDAGES/DRESSINGS) IMPLANT
GAUZE SPONGE 4X4 12PLY STRL (GAUZE/BANDAGES/DRESSINGS) IMPLANT
KIT TURNOVER KIT A (KITS) ×1 IMPLANT
NDL SAFETY ECLIPSE 18X1.5 (NEEDLE) IMPLANT
SWABSTICK PVP IODINE (MISCELLANEOUS) ×1 IMPLANT
SYR CONTROL 10ML LL (SYRINGE) IMPLANT

## 2024-02-24 NOTE — Interval H&P Note (Signed)
 History and Physical Interval Note:  02/24/2024 1:15 PM  Alicia [name]  has presented today for surgery, with the diagnosis of Right knee arthrofibrosis.  The various methods of treatment have been discussed with the patient and family. After consideration of risks, benefits and other options for treatment, the patient has consented to  Procedure(s): MANIPULATION, KNEE, CLOSED (Right) as a surgical intervention.  The patient's history has been reviewed, patient examined, no change in status, stable for surgery.  I have reviewed the patient's chart and labs.  Questions were answered to the patient's satisfaction.     Dempsey Delinda Malan

## 2024-02-24 NOTE — Discharge Instructions (Addendum)
 Alicia Moan, MD [facility] [place] [address] [place], [place] [address] [phone]  POSTOPERATIVE DIRECTIONS  Knee Rehabilitation, Guidelines Following Surgery  Results after knee surgery are often greatly improved when you follow the exercise, range of motion and muscle strengthening exercises prescribed by your doctor. Safety measures are also important to protect the knee from further injury. If any of these exercises cause you to have increased pain or swelling in your knee joint, decrease the amount until you are comfortable again and slowly increase them. If you have problems or questions, call your caregiver or physical therapist for advice.   ACTIVITY For the first 5 days, the key is rest and control of pain and swelling Do your home exercises twice a day starting on post-operative day 3. On the days you go to physical therapy, just do the home exercises once that day. You should rest, ice and elevate the leg for 50 minutes out of every hour. Get up and walk/stretch for 10 minutes per hour. After 5 days you can increase your activity slowly as tolerated. Walk with your walker as instructed. Use the walker until you are comfortable transitioning to a cane. Walk with the cane in the opposite hand of the operative leg. You may discontinue the cane once you are comfortable and walking steadily. Avoid periods of inactivity such as sitting longer than an hour when not asleep. This helps prevent blood clots.  You may discontinue the knee immobilizer once you are able to perform a straight leg raise while lying down. You may resume a sexual relationship in one month or when given the OK by your doctor.  You may return to work once you are cleared by your doctor.  Do not drive a car for 6 weeks or until released by your surgeon.  Do not drive while taking narcotics.  WEIGHT BEARING Weight bearing as tolerated with assist device (walker,  cane, etc) as directed, use it as long as suggested by your surgeon or therapist, typically at least 4-6 weeks.  POSTOPERATIVE CONSTIPATION PROTOCOL Constipation - defined medically as fewer than three stools per week and severe constipation as less than one stool per week.  One of the most common issues patients have following surgery is constipation.  Even if you have a regular bowel pattern at home, your normal regimen is likely to be disrupted due to multiple reasons following surgery.  Combination of anesthesia, postoperative narcotics, change in appetite and fluid intake all can affect your bowels.  In order to avoid complications following surgery, here are some recommendations in order to help you during your recovery period.  Colace (docusate) - Pick up an over-the-counter form of Colace or another stool softener and take twice a day as long as you are requiring postoperative pain medications.  Take with a full glass of water daily.  If you experience loose stools or diarrhea, hold the colace until you stool forms back up. If your symptoms do not get better within 1 week or if they get worse, check with your doctor. Dulcolax (bisacodyl ) - Pick up over-the-counter and take as directed by the product packaging as needed to assist with the movement of your bowels.  Take with a full glass of water.  Use this product as needed if not relieved by Colace only.  MiraLax  (polyethylene glycol) - Pick up over-the-counter to have on hand. MiraLax  is a solution that will increase the amount of water in your bowels to  assist with bowel movements.  Take as directed and can mix with a glass of water, juice, soda, coffee, or tea. Take if you go more than two days without a movement. Do not use MiraLax  more than once per day. Call your doctor if you are still constipated or irregular after using this medication for 7 days in a row.  If you continue to have problems with postoperative constipation, please contact  the office for further assistance and recommendations.  If you experience the worst abdominal pain ever or develop nausea or vomiting, please contact the office immediatly for further recommendations for treatment.  ITCHING If you experience itching with your medications, try taking only a single pain pill, or even half a pain pill at a time.  You can also use Benadryl  over the counter for itching or also to help with sleep.   MEDICATIONS See your medication summary on the "After Visit Summary" that the nursing staff will review with you prior to discharge.  You may have some home medications which will be placed on hold until you complete the course of blood thinner medication.  It is important for you to complete the blood thinner medication as prescribed by your surgeon.  Continue your approved medications as instructed at time of discharge.  PRECAUTIONS If you experience chest pain or shortness of breath - call 911 immediately for transfer to the hospital emergency department.  If you develop a fever greater that 101 F, purulent drainage from wound, increased redness or drainage from wound, foul odor from the wound/dressing, or calf pain - CONTACT YOUR SURGEON.                                                   FOLLOW-UP APPOINTMENTS Make sure you keep all of your appointments after your operation with your surgeon and caregivers. You should call the office at the above phone number and make an appointment for approximately two weeks after the date of your surgery or on the date instructed by your surgeon outlined in the After Visit Summary.  RANGE OF MOTION AND STRENGTHENING EXERCISES  Rehabilitation of the knee is important following a knee injury or an operation. After just a few days of immobilization, the muscles of the thigh which control the knee become weakened and shrink (atrophy). Knee exercises are designed to build up the tone and strength of the thigh muscles and to improve knee  motion. Often times heat used for twenty to thirty minutes before working out will loosen up your tissues and help with improving the range of motion but do not use heat for the first two weeks following surgery. These exercises can be done on a training (exercise) mat, on the floor, on a table or on a bed. Use what ever works the best and is most comfortable for you Knee exercises include:  Leg Lifts - While your knee is still immobilized in a splint or cast, you can do straight leg raises. Lift the leg to 60 degrees, hold for 3 sec, and slowly lower the leg. Repeat 10-20 times 2-3 times daily. Perform this exercise against resistance later as your knee gets better.  Quad and Hamstring Sets - Tighten up the muscle on the front of the thigh (Quad) and hold for 5-10 sec. Repeat this 10-20 times hourly. Hamstring sets are done by  pushing the foot backward against an object and holding for 5-10 sec. Repeat as with quad sets.  Leg Slides: Lying on your back, slowly slide your foot toward your buttocks, bending your knee up off the floor (only go as far as is comfortable). Then slowly slide your foot back down until your leg is flat on the floor again. Angel Wings: Lying on your back spread your legs to the side as far apart as you can without causing discomfort.  A rehabilitation program following serious knee injuries can speed recovery and prevent re-injury in the future due to weakened muscles. Contact your doctor or a physical therapist for more information on knee rehabilitation.   POST-OPERATIVE OPIOID TAPER INSTRUCTIONS: It is important to wean off of your opioid medication as soon as possible. If you do not need pain medication after your surgery it is ok to stop day one. Opioids include: Codeine, Hydrocodone (Norco, Vicodin), Oxycodone (Percocet, oxycontin ) and hydromorphone amongst others.  Long term and even short term use of opiods can cause: Increased pain  response Dependence Constipation Depression Respiratory depression And more.  Withdrawal symptoms can include Flu like symptoms Nausea, vomiting And more Techniques to manage these symptoms Hydrate well Eat regular healthy meals Stay active Use relaxation techniques(deep breathing, meditating, yoga) Do Not substitute Alcohol to help with tapering If you have been on opioids for less than two weeks and do not have pain than it is ok to stop all together.  Plan to wean off of opioids This plan should start within one week post op of your joint replacement. Maintain the same interval or time between taking each dose and first decrease the dose.  Cut the total daily intake of opioids by one tablet each day Next start to increase the time between doses. The last dose that should be eliminated is the evening dose.   MAKE SURE YOU:  Understand these instructions.  Get help right away if you are not doing well or get worse.   If you have one of these conditions, contact your surgeon for an antibiotic prescription, prior to your dental procedure.    Pick up stool softner and laxative for home use following surgery while on pain medications. Do not submerge incision under water. Please use good hand washing techniques while changing dressing each day. May shower starting three days after surgery. Please use a clean towel to pat the incision dry following showers. Continue to use ice for pain and swelling after surgery. Do not use any lotions or creams on the incision until instructed by your surgeon.

## 2024-02-24 NOTE — Anesthesia Preprocedure Evaluation (Addendum)
 Anesthesia Evaluation  Patient identified by MRN, date of birth, ID band Patient awake    Reviewed: Allergy & Precautions, H&P , NPO status , Patient's Chart, lab work & pertinent test results  Airway Mallampati: III  TM Distance: >3 FB Neck ROM: Full    Dental no notable dental hx. (+) Teeth Intact, Dental Advisory Given   Pulmonary sleep apnea and Continuous Positive Airway Pressure Ventilation    Pulmonary exam normal breath sounds clear to auscultation       Cardiovascular hypertension, Pt. on medications + dysrhythmias + pacemaker  Rhythm:Regular Rate:Normal     Neuro/Psych   Anxiety     negative neurological ROS     GI/Hepatic Neg liver ROS,GERD  Medicated,,  Endo/Other  diabetes, Insulin  DependentHypothyroidism    Renal/GU Renal InsufficiencyRenal disease  negative genitourinary   Musculoskeletal  (+) Arthritis ,    Abdominal   Peds  Hematology  (+) Blood dyscrasia, anemia   Anesthesia Other Findings   Reproductive/Obstetrics negative OB ROS                              Anesthesia Physical Anesthesia Plan  ASA: 3  Anesthesia Plan: General   Post-op Pain Management: Ofirmev  IV (intra-op)*   Induction: Intravenous  PONV Risk Score and Plan: 4 or greater and Ondansetron , Dexamethasone  and Propofol  infusion  Airway Management Planned: Natural Airway and Simple Face Mask  Additional Equipment:   Intra-op Plan:   Post-operative Plan:   Informed Consent: I have reviewed the patients History and Physical, chart, labs and discussed the procedure including the risks, benefits and alternatives for the proposed anesthesia with the patient or authorized representative who has indicated his/her understanding and acceptance.     Dental advisory given  Plan Discussed with: CRNA  Anesthesia Plan Comments:          Anesthesia Quick Evaluation

## 2024-02-24 NOTE — Anesthesia Postprocedure Evaluation (Signed)
 Anesthesia Post Note  Patient: Alicia [name]  Procedure(s) Performed: MANIPULATION, KNEE, CLOSED (Right: Knee)     Patient location during evaluation: PACU Anesthesia Type: General Level of consciousness: awake and alert Pain management: pain level controlled Vital Signs Assessment: post-procedure vital signs reviewed and stable Respiratory status: spontaneous breathing, nonlabored ventilation and respiratory function stable Cardiovascular status: blood pressure returned to baseline and stable Postop Assessment: no apparent nausea or vomiting Anesthetic complications: no   No notable events documented.  Last Vitals:  Vitals:   02/24/24 1500 02/24/24 1515  BP: (!) 163/49 (!) 177/53  Pulse: 60 60  Resp: 10 11  Temp:  (!) 36.4 C  SpO2: 93% 95%    Last Pain:  Vitals:   02/24/24 1515  TempSrc:   PainSc: 0-No pain                 Omayra Tulloch,W. EDMOND

## 2024-02-24 NOTE — Transfer of Care (Signed)
 Immediate Anesthesia Transfer of Care Note  Patient: Alicia [name]  Procedure(s) Performed: MANIPULATION, KNEE, CLOSED (Right: Knee)  Patient Location: PACU  Anesthesia Type:MAC  Level of Consciousness: awake  Airway & Oxygen Therapy: Patient Spontanous Breathing  Post-op Assessment: Report given to RN and Post -op Vital signs reviewed and stable  Post vital signs: Reviewed and stable  Last Vitals:  Vitals Value Taken Time  BP 172/50 02/24/24 14:41  Temp    Pulse 60 02/24/24 14:43  Resp 12 02/24/24 14:43  SpO2 95 % 02/24/24 14:43  Vitals shown include unfiled device data.  Last Pain:  Vitals:   02/24/24 1401  TempSrc:   PainSc: 0-No pain         Complications: No notable events documented.

## 2024-02-25 ENCOUNTER — Encounter (HOSPITAL_COMMUNITY): Payer: Self-pay | Admitting: Orthopedic Surgery

## 2024-02-25 DIAGNOSIS — R262 Difficulty in walking, not elsewhere classified: Secondary | ICD-10-CM | POA: Diagnosis not present

## 2024-02-25 DIAGNOSIS — M25561 Pain in right knee: Secondary | ICD-10-CM | POA: Diagnosis not present

## 2024-02-25 DIAGNOSIS — Z471 Aftercare following joint replacement surgery: Secondary | ICD-10-CM | POA: Diagnosis not present

## 2024-02-25 DIAGNOSIS — M6281 Muscle weakness (generalized): Secondary | ICD-10-CM | POA: Diagnosis not present

## 2024-02-25 NOTE — Op Note (Signed)
  OPERATIVE REPORT   PREOPERATIVE DIAGNOSIS: Arthrofibrosis, Right  knee.   POSTOPERATIVE DIAGNOSIS: Arthrofibrosis, Right knee.   PROCEDURE:  Right  knee closed manipulation.   SURGEON: Dempsey Moan, MD   ASSISTANT: None.   ANESTHESIA: General.   COMPLICATIONS: None.   CONDITION: Stable to Recovery.   Pre-manipulation range of motion is 0-85.  Post-manipulation range of  Motion is 0-120  PROCEDURE IN DETAIL: After successful administration of general  anesthetic, exam under anesthesia was performed showing range of motion  0-85 degrees. I then placed my chest against the proximal tibia,  flexing the knee with audible lysis of adhesions. I was easily able to  get the knee flexed to 120  degrees. I then put the knee back in full extension .The patient was subsequently awakened and transported to Recovery in  stable condition.

## 2024-02-27 DIAGNOSIS — M6281 Muscle weakness (generalized): Secondary | ICD-10-CM | POA: Diagnosis not present

## 2024-02-27 DIAGNOSIS — R262 Difficulty in walking, not elsewhere classified: Secondary | ICD-10-CM | POA: Diagnosis not present

## 2024-02-27 DIAGNOSIS — M25561 Pain in right knee: Secondary | ICD-10-CM | POA: Diagnosis not present

## 2024-02-27 DIAGNOSIS — Z471 Aftercare following joint replacement surgery: Secondary | ICD-10-CM | POA: Diagnosis not present

## 2024-03-02 ENCOUNTER — Ambulatory Visit: Payer: Self-pay | Admitting: Cardiovascular Disease

## 2024-03-02 DIAGNOSIS — M6281 Muscle weakness (generalized): Secondary | ICD-10-CM | POA: Diagnosis not present

## 2024-03-02 DIAGNOSIS — R262 Difficulty in walking, not elsewhere classified: Secondary | ICD-10-CM | POA: Diagnosis not present

## 2024-03-02 DIAGNOSIS — Z471 Aftercare following joint replacement surgery: Secondary | ICD-10-CM | POA: Diagnosis not present

## 2024-03-02 DIAGNOSIS — M25561 Pain in right knee: Secondary | ICD-10-CM | POA: Diagnosis not present

## 2024-03-04 DIAGNOSIS — M25561 Pain in right knee: Secondary | ICD-10-CM | POA: Diagnosis not present

## 2024-03-04 DIAGNOSIS — R262 Difficulty in walking, not elsewhere classified: Secondary | ICD-10-CM | POA: Diagnosis not present

## 2024-03-04 DIAGNOSIS — M6281 Muscle weakness (generalized): Secondary | ICD-10-CM | POA: Diagnosis not present

## 2024-03-04 DIAGNOSIS — Z471 Aftercare following joint replacement surgery: Secondary | ICD-10-CM | POA: Diagnosis not present

## 2024-03-12 ENCOUNTER — Ambulatory Visit: Admitting: Internal Medicine

## 2024-05-18 ENCOUNTER — Ambulatory Visit: Payer: Medicare Other

## 2024-05-18 DIAGNOSIS — I441 Atrioventricular block, second degree: Secondary | ICD-10-CM

## 2024-05-20 LAB — CUP PACEART REMOTE DEVICE CHECK
Battery Remaining Longevity: 10 mo
Battery Remaining Percentage: 7 %
Battery Voltage: 2.8 V
Brady Statistic AP VP Percent: 1 %
Brady Statistic AP VS Percent: 49 %
Brady Statistic AS VP Percent: 1 %
Brady Statistic AS VS Percent: 51 %
Brady Statistic RA Percent Paced: 47 %
Brady Statistic RV Percent Paced: 1 %
Date Time Interrogation Session: 20260113180647
Implantable Lead Connection Status: 753985
Implantable Lead Connection Status: 753985
Implantable Lead Implant Date: 20140811
Implantable Lead Implant Date: 20140811
Implantable Lead Location: 753859
Implantable Lead Location: 753860
Implantable Pulse Generator Implant Date: 20140811
Lead Channel Impedance Value: 340 Ohm
Lead Channel Impedance Value: 400 Ohm
Lead Channel Pacing Threshold Amplitude: 0.75 V
Lead Channel Pacing Threshold Amplitude: 0.75 V
Lead Channel Pacing Threshold Pulse Width: 0.4 ms
Lead Channel Pacing Threshold Pulse Width: 0.4 ms
Lead Channel Sensing Intrinsic Amplitude: 12 mV
Lead Channel Sensing Intrinsic Amplitude: 3.2 mV
Lead Channel Setting Pacing Amplitude: 1 V
Lead Channel Setting Pacing Amplitude: 1.75 V
Lead Channel Setting Pacing Pulse Width: 0.4 ms
Lead Channel Setting Sensing Sensitivity: 2 mV
Pulse Gen Model: 2240
Pulse Gen Serial Number: 7528878

## 2024-05-22 ENCOUNTER — Ambulatory Visit: Payer: Self-pay | Admitting: Cardiovascular Disease

## 2024-05-22 NOTE — Progress Notes (Signed)
 Remote PPM Transmission
